# Patient Record
Sex: Male | Born: 1940 | Race: White | Hispanic: No | Marital: Married | Smoking: Former smoker
Health system: Southern US, Community
[De-identification: ages and names within clinical notes are randomized; demographics above are authoritative.]

## PROBLEM LIST (undated history)

## (undated) DIAGNOSIS — R200 Anesthesia of skin: Secondary | ICD-10-CM

## (undated) DIAGNOSIS — K8044 Calculus of bile duct with chronic cholecystitis without obstruction: Secondary | ICD-10-CM

## (undated) DIAGNOSIS — K219 Gastro-esophageal reflux disease without esophagitis: Secondary | ICD-10-CM

## (undated) DIAGNOSIS — Z8711 Personal history of peptic ulcer disease: Secondary | ICD-10-CM

## (undated) DIAGNOSIS — N2889 Other specified disorders of kidney and ureter: Secondary | ICD-10-CM

## (undated) DIAGNOSIS — N400 Enlarged prostate without lower urinary tract symptoms: Secondary | ICD-10-CM

## (undated) DIAGNOSIS — T8859XA Other complications of anesthesia, initial encounter: Secondary | ICD-10-CM

## (undated) DIAGNOSIS — R9430 Abnormal result of cardiovascular function study, unspecified: Secondary | ICD-10-CM

## (undated) DIAGNOSIS — I10 Essential (primary) hypertension: Secondary | ICD-10-CM

## (undated) DIAGNOSIS — Z87442 Personal history of urinary calculi: Secondary | ICD-10-CM

## (undated) DIAGNOSIS — Z8614 Personal history of Methicillin resistant Staphylococcus aureus infection: Secondary | ICD-10-CM

## (undated) DIAGNOSIS — Z01810 Encounter for preprocedural cardiovascular examination: Secondary | ICD-10-CM

## (undated) DIAGNOSIS — I2510 Atherosclerotic heart disease of native coronary artery without angina pectoris: Secondary | ICD-10-CM

## (undated) DIAGNOSIS — M1990 Unspecified osteoarthritis, unspecified site: Secondary | ICD-10-CM

## (undated) DIAGNOSIS — N289 Disorder of kidney and ureter, unspecified: Secondary | ICD-10-CM

## (undated) DIAGNOSIS — E1169 Type 2 diabetes mellitus with other specified complication: Secondary | ICD-10-CM

## (undated) DIAGNOSIS — L97509 Non-pressure chronic ulcer of other part of unspecified foot with unspecified severity: Secondary | ICD-10-CM

## (undated) DIAGNOSIS — F419 Anxiety disorder, unspecified: Secondary | ICD-10-CM

## (undated) DIAGNOSIS — G629 Polyneuropathy, unspecified: Secondary | ICD-10-CM

## (undated) DIAGNOSIS — K859 Acute pancreatitis without necrosis or infection, unspecified: Secondary | ICD-10-CM

## (undated) DIAGNOSIS — R350 Frequency of micturition: Secondary | ICD-10-CM

## (undated) DIAGNOSIS — Z794 Long term (current) use of insulin: Secondary | ICD-10-CM

## (undated) DIAGNOSIS — IMO0002 Reserved for concepts with insufficient information to code with codable children: Secondary | ICD-10-CM

## (undated) DIAGNOSIS — E11621 Type 2 diabetes mellitus with foot ulcer: Secondary | ICD-10-CM

## (undated) DIAGNOSIS — R21 Rash and other nonspecific skin eruption: Secondary | ICD-10-CM

## (undated) DIAGNOSIS — G4733 Obstructive sleep apnea (adult) (pediatric): Secondary | ICD-10-CM

## (undated) DIAGNOSIS — R319 Hematuria, unspecified: Secondary | ICD-10-CM

## (undated) DIAGNOSIS — E119 Type 2 diabetes mellitus without complications: Secondary | ICD-10-CM

## (undated) DIAGNOSIS — E785 Hyperlipidemia, unspecified: Secondary | ICD-10-CM

## (undated) DIAGNOSIS — C801 Malignant (primary) neoplasm, unspecified: Secondary | ICD-10-CM

## (undated) DIAGNOSIS — Z8739 Personal history of other diseases of the musculoskeletal system and connective tissue: Secondary | ICD-10-CM

## (undated) DIAGNOSIS — T4145XA Adverse effect of unspecified anesthetic, initial encounter: Secondary | ICD-10-CM

## (undated) DIAGNOSIS — R351 Nocturia: Secondary | ICD-10-CM

## (undated) DIAGNOSIS — Z8719 Personal history of other diseases of the digestive system: Secondary | ICD-10-CM

## (undated) HISTORY — PX: BILROTH II PROCEDURE: SHX1232

## (undated) HISTORY — PX: CARDIAC CATHETERIZATION: SHX172

## (undated) HISTORY — DX: Disorder of kidney and ureter, unspecified: N28.9

## (undated) HISTORY — DX: Polyneuropathy, unspecified: G62.9

## (undated) HISTORY — DX: Gastro-esophageal reflux disease without esophagitis: K21.9

## (undated) HISTORY — DX: Abnormal result of cardiovascular function study, unspecified: R94.30

## (undated) HISTORY — PX: OTHER SURGICAL HISTORY: SHX169

## (undated) HISTORY — DX: Encounter for preprocedural cardiovascular examination: Z01.810

## (undated) HISTORY — DX: Type 2 diabetes mellitus with other specified complication: E11.69

## (undated) HISTORY — DX: Reserved for concepts with insufficient information to code with codable children: IMO0002

## (undated) HISTORY — DX: Hyperlipidemia, unspecified: E78.5

---

## 1994-05-03 HISTORY — PX: CORONARY ANGIOPLASTY WITH STENT PLACEMENT: SHX49

## 1997-11-30 ENCOUNTER — Inpatient Hospital Stay (HOSPITAL_COMMUNITY): Admission: EM | Admit: 1997-11-30 | Discharge: 1997-12-03 | Payer: Self-pay | Admitting: *Deleted

## 1998-05-03 HISTORY — PX: OTHER SURGICAL HISTORY: SHX169

## 1998-11-28 ENCOUNTER — Inpatient Hospital Stay (HOSPITAL_COMMUNITY): Admission: AD | Admit: 1998-11-28 | Discharge: 1998-12-07 | Payer: Self-pay | Admitting: Family Medicine

## 1998-11-28 ENCOUNTER — Encounter: Payer: Self-pay | Admitting: Family Medicine

## 1998-11-29 ENCOUNTER — Encounter: Payer: Self-pay | Admitting: Family Medicine

## 1998-12-05 ENCOUNTER — Encounter: Payer: Self-pay | Admitting: Family Medicine

## 1999-04-16 ENCOUNTER — Encounter: Admission: RE | Admit: 1999-04-16 | Discharge: 1999-04-16 | Payer: Self-pay | Admitting: Orthopedic Surgery

## 1999-04-16 ENCOUNTER — Encounter: Payer: Self-pay | Admitting: Orthopedic Surgery

## 1999-05-01 ENCOUNTER — Encounter: Payer: Self-pay | Admitting: Emergency Medicine

## 1999-05-02 ENCOUNTER — Inpatient Hospital Stay (HOSPITAL_COMMUNITY): Admission: EM | Admit: 1999-05-02 | Discharge: 1999-05-05 | Payer: Self-pay | Admitting: Cardiology

## 1999-08-26 ENCOUNTER — Encounter: Payer: Self-pay | Admitting: Emergency Medicine

## 1999-08-26 ENCOUNTER — Encounter: Payer: Self-pay | Admitting: Cardiology

## 1999-08-26 ENCOUNTER — Inpatient Hospital Stay (HOSPITAL_COMMUNITY): Admission: EM | Admit: 1999-08-26 | Discharge: 1999-08-27 | Payer: Self-pay | Admitting: Emergency Medicine

## 2000-02-25 ENCOUNTER — Ambulatory Visit (HOSPITAL_COMMUNITY): Admission: RE | Admit: 2000-02-25 | Discharge: 2000-02-25 | Payer: Self-pay | Admitting: Specialist

## 2000-02-25 ENCOUNTER — Encounter: Payer: Self-pay | Admitting: Specialist

## 2001-02-21 ENCOUNTER — Encounter: Admission: RE | Admit: 2001-02-21 | Discharge: 2001-05-02 | Payer: Self-pay | Admitting: Orthopedic Surgery

## 2001-02-28 ENCOUNTER — Ambulatory Visit (HOSPITAL_COMMUNITY): Admission: RE | Admit: 2001-02-28 | Discharge: 2001-02-28 | Payer: Self-pay | Admitting: Orthopedic Surgery

## 2001-02-28 ENCOUNTER — Encounter: Payer: Self-pay | Admitting: Orthopedic Surgery

## 2001-03-01 ENCOUNTER — Encounter: Payer: Self-pay | Admitting: Orthopedic Surgery

## 2001-03-17 ENCOUNTER — Emergency Department (HOSPITAL_COMMUNITY): Admission: EM | Admit: 2001-03-17 | Discharge: 2001-03-17 | Payer: Self-pay

## 2001-06-03 ENCOUNTER — Encounter: Admission: RE | Admit: 2001-06-03 | Discharge: 2001-08-04 | Payer: Self-pay | Admitting: Orthopedic Surgery

## 2001-09-11 ENCOUNTER — Encounter (HOSPITAL_BASED_OUTPATIENT_CLINIC_OR_DEPARTMENT_OTHER): Admission: RE | Admit: 2001-09-11 | Discharge: 2001-12-10 | Payer: Self-pay | Admitting: Internal Medicine

## 2002-02-15 ENCOUNTER — Encounter (HOSPITAL_BASED_OUTPATIENT_CLINIC_OR_DEPARTMENT_OTHER): Admission: RE | Admit: 2002-02-15 | Discharge: 2002-05-16 | Payer: Self-pay | Admitting: Internal Medicine

## 2002-10-23 ENCOUNTER — Encounter: Admission: RE | Admit: 2002-10-23 | Discharge: 2002-12-13 | Payer: Self-pay | Admitting: Family Medicine

## 2003-05-04 HISTORY — PX: CATARACT EXTRACTION W/ INTRAOCULAR LENS  IMPLANT, BILATERAL: SHX1307

## 2004-05-13 ENCOUNTER — Encounter (HOSPITAL_BASED_OUTPATIENT_CLINIC_OR_DEPARTMENT_OTHER): Admission: RE | Admit: 2004-05-13 | Discharge: 2004-07-24 | Payer: Self-pay | Admitting: Internal Medicine

## 2004-08-03 ENCOUNTER — Encounter (HOSPITAL_BASED_OUTPATIENT_CLINIC_OR_DEPARTMENT_OTHER): Admission: RE | Admit: 2004-08-03 | Discharge: 2004-11-01 | Payer: Self-pay | Admitting: Surgery

## 2004-08-04 ENCOUNTER — Ambulatory Visit (HOSPITAL_COMMUNITY): Admission: RE | Admit: 2004-08-04 | Discharge: 2004-08-04 | Payer: Self-pay | Admitting: Internal Medicine

## 2006-05-03 DIAGNOSIS — Z8719 Personal history of other diseases of the digestive system: Secondary | ICD-10-CM

## 2006-05-03 HISTORY — PX: PENILE PROSTHESIS IMPLANT: SHX240

## 2006-05-03 HISTORY — DX: Personal history of other diseases of the digestive system: Z87.19

## 2006-07-07 ENCOUNTER — Encounter (HOSPITAL_BASED_OUTPATIENT_CLINIC_OR_DEPARTMENT_OTHER): Admission: RE | Admit: 2006-07-07 | Discharge: 2006-10-05 | Payer: Self-pay | Admitting: Surgery

## 2007-02-02 ENCOUNTER — Inpatient Hospital Stay (HOSPITAL_COMMUNITY): Admission: EM | Admit: 2007-02-02 | Discharge: 2007-02-07 | Payer: Self-pay | Admitting: Emergency Medicine

## 2007-02-13 ENCOUNTER — Ambulatory Visit: Payer: Self-pay | Admitting: Internal Medicine

## 2008-06-17 ENCOUNTER — Encounter: Payer: Self-pay | Admitting: Family Medicine

## 2008-07-24 ENCOUNTER — Ambulatory Visit: Payer: Self-pay | Admitting: Family Medicine

## 2008-07-24 DIAGNOSIS — G609 Hereditary and idiopathic neuropathy, unspecified: Secondary | ICD-10-CM

## 2008-07-24 DIAGNOSIS — L84 Corns and callosities: Secondary | ICD-10-CM | POA: Insufficient documentation

## 2008-07-24 DIAGNOSIS — E785 Hyperlipidemia, unspecified: Secondary | ICD-10-CM

## 2008-07-24 DIAGNOSIS — K219 Gastro-esophageal reflux disease without esophagitis: Secondary | ICD-10-CM | POA: Insufficient documentation

## 2008-07-24 DIAGNOSIS — J309 Allergic rhinitis, unspecified: Secondary | ICD-10-CM | POA: Insufficient documentation

## 2008-07-24 DIAGNOSIS — N189 Chronic kidney disease, unspecified: Secondary | ICD-10-CM | POA: Insufficient documentation

## 2008-07-24 DIAGNOSIS — N259 Disorder resulting from impaired renal tubular function, unspecified: Secondary | ICD-10-CM | POA: Insufficient documentation

## 2008-08-01 ENCOUNTER — Ambulatory Visit: Payer: Self-pay | Admitting: Family Medicine

## 2008-08-01 DIAGNOSIS — L97409 Non-pressure chronic ulcer of unspecified heel and midfoot with unspecified severity: Secondary | ICD-10-CM | POA: Insufficient documentation

## 2008-08-01 DIAGNOSIS — E1169 Type 2 diabetes mellitus with other specified complication: Secondary | ICD-10-CM

## 2008-08-01 HISTORY — DX: Type 2 diabetes mellitus with other specified complication: E11.69

## 2008-08-13 ENCOUNTER — Ambulatory Visit: Payer: Self-pay | Admitting: Family Medicine

## 2008-08-27 ENCOUNTER — Ambulatory Visit: Payer: Self-pay | Admitting: Family Medicine

## 2008-09-17 ENCOUNTER — Ambulatory Visit: Payer: Self-pay | Admitting: Family Medicine

## 2008-09-17 DIAGNOSIS — J069 Acute upper respiratory infection, unspecified: Secondary | ICD-10-CM | POA: Insufficient documentation

## 2008-09-18 ENCOUNTER — Telehealth: Payer: Self-pay | Admitting: Family Medicine

## 2008-09-18 LAB — CONVERTED CEMR LAB
ALT: 19 units/L (ref 0–53)
AST: 24 units/L (ref 0–37)
Alkaline Phosphatase: 32 units/L — ABNORMAL LOW (ref 39–117)
Bilirubin, Direct: 0.1 mg/dL (ref 0.0–0.3)
Chloride: 107 meq/L (ref 96–112)
Glucose, Bld: 176 mg/dL — ABNORMAL HIGH (ref 70–99)
HDL: 24.7 mg/dL — ABNORMAL LOW (ref >39.00)
LDL Cholesterol: 64 mg/dL (ref 0–99)
Potassium: 3.8 meq/L (ref 3.5–5.1)
Sodium: 145 meq/L (ref 135–145)
Total Bilirubin: 0.8 mg/dL (ref 0.3–1.2)
VLDL: 33.8 mg/dL (ref 0.0–40.0)

## 2008-09-27 ENCOUNTER — Telehealth: Payer: Self-pay | Admitting: Family Medicine

## 2008-10-04 ENCOUNTER — Encounter: Payer: Self-pay | Admitting: Family Medicine

## 2008-10-04 DIAGNOSIS — E663 Overweight: Secondary | ICD-10-CM | POA: Insufficient documentation

## 2008-10-04 DIAGNOSIS — A4902 Methicillin resistant Staphylococcus aureus infection, unspecified site: Secondary | ICD-10-CM | POA: Insufficient documentation

## 2008-10-04 DIAGNOSIS — G4730 Sleep apnea, unspecified: Secondary | ICD-10-CM

## 2008-10-08 ENCOUNTER — Ambulatory Visit: Payer: Self-pay | Admitting: Family Medicine

## 2008-12-19 ENCOUNTER — Ambulatory Visit: Payer: Self-pay | Admitting: Family Medicine

## 2008-12-20 LAB — CONVERTED CEMR LAB: Hgb A1c MFr Bld: 6.9 % — ABNORMAL HIGH (ref 4.6–6.5)

## 2008-12-24 ENCOUNTER — Ambulatory Visit: Payer: Self-pay | Admitting: Family Medicine

## 2008-12-25 ENCOUNTER — Encounter: Payer: Self-pay | Admitting: Family Medicine

## 2009-02-19 ENCOUNTER — Ambulatory Visit: Payer: Self-pay | Admitting: Family Medicine

## 2009-02-19 ENCOUNTER — Encounter: Payer: Self-pay | Admitting: Family Medicine

## 2009-03-31 ENCOUNTER — Telehealth: Payer: Self-pay | Admitting: Family Medicine

## 2009-04-07 ENCOUNTER — Ambulatory Visit: Payer: Self-pay | Admitting: Family Medicine

## 2009-04-07 DIAGNOSIS — R42 Dizziness and giddiness: Secondary | ICD-10-CM | POA: Insufficient documentation

## 2009-04-09 ENCOUNTER — Telehealth: Payer: Self-pay | Admitting: Family Medicine

## 2009-04-10 ENCOUNTER — Telehealth: Payer: Self-pay | Admitting: Family Medicine

## 2009-04-18 ENCOUNTER — Ambulatory Visit: Payer: Self-pay | Admitting: Family Medicine

## 2009-04-18 LAB — CONVERTED CEMR LAB
Albumin: 3.7 g/dL (ref 3.5–5.2)
CO2: 27 meq/L (ref 19–32)
Calcium: 9.4 mg/dL (ref 8.4–10.5)
Chloride: 101 meq/L (ref 96–112)
Cholesterol: 140 mg/dL (ref 0–200)
GFR calc non Af Amer: 37.69 mL/min (ref >60)
HDL: 27.7 mg/dL — ABNORMAL LOW (ref >39.00)
Sodium: 138 meq/L (ref 135–145)
Total Bilirubin: 0.7 mg/dL (ref 0.3–1.2)
Total CHOL/HDL Ratio: 5

## 2009-05-27 ENCOUNTER — Ambulatory Visit: Payer: Self-pay | Admitting: Family Medicine

## 2009-06-10 ENCOUNTER — Ambulatory Visit: Payer: Self-pay | Admitting: Family Medicine

## 2009-06-24 ENCOUNTER — Ambulatory Visit: Payer: Self-pay | Admitting: Family Medicine

## 2009-06-25 ENCOUNTER — Encounter: Payer: Self-pay | Admitting: Family Medicine

## 2009-06-25 ENCOUNTER — Telehealth: Payer: Self-pay | Admitting: Family Medicine

## 2009-07-08 ENCOUNTER — Ambulatory Visit: Payer: Self-pay | Admitting: Family Medicine

## 2009-07-11 ENCOUNTER — Encounter (HOSPITAL_BASED_OUTPATIENT_CLINIC_OR_DEPARTMENT_OTHER): Admission: RE | Admit: 2009-07-11 | Discharge: 2009-09-30 | Payer: Self-pay | Admitting: Internal Medicine

## 2009-07-15 ENCOUNTER — Telehealth: Payer: Self-pay | Admitting: Family Medicine

## 2009-07-21 ENCOUNTER — Ambulatory Visit: Payer: Self-pay | Admitting: Family Medicine

## 2009-07-23 LAB — CONVERTED CEMR LAB
Calcium: 9.3 mg/dL (ref 8.4–10.5)
Creatinine, Ser: 1.9 mg/dL — ABNORMAL HIGH (ref 0.4–1.5)
PSA: 0.97 ng/mL (ref 0.10–4.00)

## 2009-08-19 ENCOUNTER — Ambulatory Visit: Payer: Self-pay | Admitting: Family Medicine

## 2009-08-19 LAB — CONVERTED CEMR LAB: Rapid Strep: NEGATIVE

## 2009-10-01 ENCOUNTER — Encounter (HOSPITAL_BASED_OUTPATIENT_CLINIC_OR_DEPARTMENT_OTHER): Admission: RE | Admit: 2009-10-01 | Discharge: 2009-11-06 | Payer: Self-pay | Admitting: Internal Medicine

## 2009-11-07 ENCOUNTER — Encounter (HOSPITAL_BASED_OUTPATIENT_CLINIC_OR_DEPARTMENT_OTHER): Admission: RE | Admit: 2009-11-07 | Discharge: 2009-12-16 | Payer: Self-pay | Admitting: Internal Medicine

## 2009-12-08 ENCOUNTER — Ambulatory Visit: Payer: Self-pay | Admitting: Family Medicine

## 2009-12-08 DIAGNOSIS — M76899 Other specified enthesopathies of unspecified lower limb, excluding foot: Secondary | ICD-10-CM

## 2009-12-08 LAB — CONVERTED CEMR LAB
Cholesterol, target level: 200 mg/dL
LDL Goal: 100 mg/dL

## 2009-12-09 LAB — CONVERTED CEMR LAB
CO2: 32 meq/L (ref 19–32)
GFR calc non Af Amer: 39.04 mL/min (ref >60)
Glucose, Bld: 66 mg/dL — ABNORMAL LOW (ref 70–99)
Hgb A1c MFr Bld: 8.4 % — ABNORMAL HIGH (ref 4.6–6.5)
Potassium: 4.4 meq/L (ref 3.5–5.1)

## 2010-03-09 ENCOUNTER — Ambulatory Visit: Payer: Self-pay | Admitting: Family Medicine

## 2010-03-09 LAB — CONVERTED CEMR LAB
ALT: 17 units/L (ref 0–53)
Albumin: 3.2 g/dL — ABNORMAL LOW (ref 3.5–5.2)
Basophils Absolute: 0 10*3/uL (ref 0.0–0.1)
Basophils Relative: 0.3 % (ref 0.0–3.0)
Bilirubin, Direct: 0.1 mg/dL (ref 0.0–0.3)
Eosinophils Absolute: 0.4 10*3/uL (ref 0.0–0.7)
Hemoglobin: 13.5 g/dL (ref 13.0–17.0)
Lymphs Abs: 1.9 10*3/uL (ref 0.7–4.0)
Monocytes Absolute: 0.7 10*3/uL (ref 0.1–1.0)
Platelets: 246 10*3/uL (ref 150.0–400.0)
RBC: 4.64 M/uL (ref 4.22–5.81)
RDW: 14.9 % — ABNORMAL HIGH (ref 11.5–14.6)
Total Protein: 5.9 g/dL — ABNORMAL LOW (ref 6.0–8.3)
WBC: 9.1 10*3/uL (ref 4.5–10.5)

## 2010-03-12 ENCOUNTER — Telehealth: Payer: Self-pay | Admitting: Family Medicine

## 2010-03-12 DIAGNOSIS — K859 Acute pancreatitis without necrosis or infection, unspecified: Secondary | ICD-10-CM

## 2010-03-12 HISTORY — DX: Acute pancreatitis without necrosis or infection, unspecified: K85.90

## 2010-03-18 ENCOUNTER — Encounter: Admission: RE | Admit: 2010-03-18 | Discharge: 2010-03-18 | Payer: Self-pay | Admitting: Family Medicine

## 2010-03-23 ENCOUNTER — Ambulatory Visit: Payer: Self-pay | Admitting: Family Medicine

## 2010-03-23 LAB — HM DIABETES FOOT EXAM

## 2010-03-24 LAB — CONVERTED CEMR LAB
AST: 20 units/L (ref 0–37)
Albumin: 3.2 g/dL — ABNORMAL LOW (ref 3.5–5.2)
Bilirubin, Direct: 0.1 mg/dL (ref 0.0–0.3)
Direct LDL: 82.7 mg/dL
Total Bilirubin: 0.4 mg/dL (ref 0.3–1.2)
Total Protein: 6.1 g/dL (ref 6.0–8.3)

## 2010-04-06 ENCOUNTER — Ambulatory Visit: Payer: Self-pay | Admitting: Family Medicine

## 2010-04-17 ENCOUNTER — Telehealth: Payer: Self-pay | Admitting: Family Medicine

## 2010-05-23 ENCOUNTER — Encounter: Payer: Self-pay | Admitting: Specialist

## 2010-05-24 ENCOUNTER — Encounter: Payer: Self-pay | Admitting: Family Medicine

## 2010-05-24 ENCOUNTER — Encounter: Payer: Self-pay | Admitting: Specialist

## 2010-05-24 ENCOUNTER — Encounter (HOSPITAL_BASED_OUTPATIENT_CLINIC_OR_DEPARTMENT_OTHER): Payer: Self-pay | Admitting: Internal Medicine

## 2010-06-04 NOTE — Assessment & Plan Note (Signed)
Summary: ?abd pain ok per doc/njr   Vital Signs:  Patient profile:   70 year old male Weight:      296 pounds Temp:     98.7 degrees F oral BP sitting:   130 / 62  (left arm) Cuff size:   large  Vitals Entered By: Sid Falcon LPN (March 09, 2010 2:39 PM)  History of Present Illness: Patient seen as a work in with onset Saturday 2 days ago of mostly midepigastric pain with some radiation to the back. Pain is relatively constant and described as a dull ache of moderate severity. Decreased appetite. Has some nausea and 4 episodes of vomiting bile-like fluid. No hematemesis. Increased omeprazole to 40 mg twice daily without improvement. Denies any fever. Somewhat similar symptoms previously with pancreatitis several years ago. No history of gallbladder disease. No localizing right upper quadrant pain. Last bowel movement was this past Saturday and normal  Type 2 diabetes and not checking sugars much recently.  Allergies: 1)  ! Sulfamethoxazole (Sulfamethoxazole) 2)  ! Dilaudid (Hydromorphone Hcl)  Past History:  Past Medical History: Last updated: 10/04/2008 Allergic rhinitis Diabetes mellitus, type II GERD Hyperlipidemia Peripheral neuropathy Renal insufficiency hx diabetic foot ulcers overweight sleep apnea--CPAP acute pancreatitis 10/08 MRSA, hx of  Past Surgical History: Last updated: 10/04/2008 PTCA/stent penile implant  Social History: Last updated: 07/24/2008 Retired Married Never Smoked Alcohol use-no Regular exercise-no  Risk Factors: Exercise: no (07/24/2008)  Risk Factors: Smoking Status: never (07/24/2008) PMH-FH-SH reviewed for relevance  Review of Systems       The patient complains of anorexia and abdominal pain.  The patient denies fever, weight loss, chest pain, syncope, dyspnea on exertion, peripheral edema, hemoptysis, melena, hematochezia, and severe indigestion/heartburn.    Physical Exam  General:  Well-developed,well-nourished,in  no acute distress; alert,appropriate and cooperative throughout examination Mouth:  Oral mucosa and oropharynx without lesions or exudates.  Teeth in good repair. Neck:  No deformities, masses, or tenderness noted. Lungs:  Normal respiratory effort, chest expands symmetrically. Lungs are clear to auscultation, no crackles or wheezes. Heart:  normal rate and regular rhythm.   Abdomen:  soft, normal bowel sounds, no distention, no masses, no guarding, no rigidity, no abdominal hernia, no hepatomegaly, and no splenomegaly.  patient has tenderness mid epigastric area but also somewhat poorly localized left upper quadrant and some mid right and left quadrants. Cervical Nodes:  No lymphadenopathy noted Psych:  normally interactive, good eye contact, and not depressed appearing.     Impression & Recommendations:  Problem # 1:  ABDOMINAL PAIN, EPIGASTRIC (ICD-789.06) Assessment New  differential includes acute pancreatitis, diabetic gastroparesis, peptic ulcer disease. Not dehydrated currently.  Labs and metoclopramide for nausea. Orders: Venipuncture (04540) Specimen Handling (98119) TLB-Hepatic/Liver Function Pnl (80076-HEPATIC) TLB-CBC Platelet - w/Differential (85025-CBCD) TLB-Lipase (83690-LIPASE)  His updated medication list for this problem includes:    Reglan 10 Mg Tabs (Metoclopramide hcl) ..... One by mouth qid - a ac meals and q hs as needed nausea and vomiting  Problem # 2:  DIABETES MELLITUS, TYPE II (ICD-250.00)  His updated medication list for this problem includes:    Lisinopril 10 Mg Tabs (Lisinopril) ..... Once daily    Humulin 70/30 70-30 % Susp (Insulin isophane & regular) ..... Inject 70 units subcutaneously in the morning and 50-60 units in the evening as directed  Complete Medication List: 1)  Omeprazole 20 Mg Cpdr (Omeprazole) .... Two times a day 2)  Gabapentin 800 Mg Tabs (Gabapentin) .... One tab 5 x  day 3)  Lisinopril 10 Mg Tabs (Lisinopril) .... Once daily 4)   Furosemide 40 Mg Tabs (Furosemide) .... Once daily 5)  Oxistat 1 % Crea (Oxiconazole nitrate) .... Use as directed 6)  Fexofenadine Hcl 180 Mg Tabs (Fexofenadine hcl) .... Once daily 7)  Fenofibrate 160 Mg Tabs (Fenofibrate) .... One by mouth once daily 8)  Simvastatin 40 Mg Tabs (Simvastatin) .... One by mouth once daily 9)  1st Choice Pen Needles 31g X 8 Mm Misc (Insulin pen needle) 10)  Humulin 70/30 70-30 % Susp (Insulin isophane & regular) .... Inject 70 units subcutaneously in the morning and 50-60 units in the evening as directed 11)  Relion Insulin Syringe 30g X 5/16" 1 Ml Misc (Insulin syringe-needle u-100) .... Use as directed two times a day 12)  Relion Ultima Test Strp (Glucose blood) .... Check fasting blood sugar in the morning as directed 13)  Reglan 10 Mg Tabs (Metoclopramide hcl) .... One by mouth qid - a ac meals and q hs as needed nausea and vomiting  Patient Instructions: 1)  Bland diet. 2)  Please schedule a follow-up appointment in 2 weeks.  3)  Follow up sooner if any fever, persistent vomiting, or any worsening abdominal pain. Prescriptions: REGLAN 10 MG TABS (METOCLOPRAMIDE HCL) one by mouth qid - a AC meals and q hs as needed nausea and vomiting  #30 x 0   Entered and Authorized by:   Evelena Peat MD   Signed by:   Evelena Peat MD on 03/09/2010   Method used:   Electronically to        [facility] [phone]* (retail)       [address] [place], [place]  [address]       Ph: [phone]       Fax: [phone]   RxID:   [id number]    Orders Added: 1)  Venipuncture [24401] 2)  Specimen Handling [99000] 3)  TLB-Hepatic/Liver Function Pnl [80076-HEPATIC] 4)  TLB-CBC Platelet - w/Differential [85025-CBCD] 5)  TLB-Lipase [83690-LIPASE] 6)  Est. Patient Level IV [02725]

## 2010-06-04 NOTE — Progress Notes (Signed)
Summary: pain meds  Phone Note Call from Patient Call back at Home Phone [phone]   Caller: Patient Call For: Frank Peat MD Summary of Call: Pt is still having a lot of pain, and the meds are not helping.  Needs something else. Initial call taken by: [facility] CMA AAMA,  March 12, 2010 12:36 PM  Follow-up for Phone Call        already answered in another Phone note. Follow-up by: Frank Peat MD,  March 12, 2010 12:51 PM

## 2010-06-04 NOTE — Progress Notes (Signed)
Summary: sore throat  Phone Note Call from Patient   Summary of Call: Pt is complaining of right ear pain and sore throat,  Refuses office visit. Cyndie Mull [phone] Initial call taken by: Lynann Beaver CMA,  June 25, 2009 12:58 PM  Follow-up for Phone Call        Very likely viral.  If no fever I would recommend treating symptomatically with Tylenol.   antibiotics are not likely to help.  If he has any purulent nasal secretions or productive cough would consider antibiotic. Follow-up by: Evelena Peat MD,  June 25, 2009 1:03 PM  Additional Follow-up for Phone Call Additional follow up Details #1::        Spoke with pt.  Severe sore throat and Occ productive cough.  OK to call in Amoxicillin 875 mg by mouth two times a day for 10 days. Additional Follow-up by: Evelena Peat MD,  June 25, 2009 1:21 PM    New/Updated Medications: AMOXICILLIN 875 MG TABS (AMOXICILLIN) one by mouth bid Prescriptions: AMOXICILLIN 875 MG TABS (AMOXICILLIN) one by mouth bid  #20 x 0   Entered by:   Lynann Beaver CMA   Authorized by:   Evelena Peat MD   Signed by:   Lynann Beaver CMA on 06/26/2009   Method used:   Electronically to        [facility] [phone]* (retail)       [address] [place], [place]  [address]       Ph: [phone]       Fax: [phone]   RxID:   [id number]

## 2010-06-04 NOTE — Miscellaneous (Signed)
Summary: Orders Update  Clinical Lists Changes  Orders: Added new Referral order of Wound Care Center Referral (Wound Care) - Signed

## 2010-06-04 NOTE — Assessment & Plan Note (Signed)
Summary: follow up/cjr   Vital Signs:  Patient profile:   70 year old male Weight:      300 pounds Temp:     98.0 degrees F oral BP sitting:   140 / 80  (left arm) Cuff size:   large  Vitals Entered By: Sid Falcon LPN (December 08, 2009 10:19 AM) CC: Right leg, hip pain, Lipid Management   History of Present Illness: Patient here for medical followup.  Type 2 diabetes. He has had decent control recently but has had poor compliance with diet. Poor healing right plantar great toe ulcer and was sent for hyperbaric treatment. Patient states that his blood sugars had been slightly elevated for that treatment and that started his poor compliance. Recent fasting blood sugars around 150 but some postprandial sugars around 400 at night. No significant symptoms of hyperglycemia.  Last eye exam last fall. Patient compliant with diabetic medications  Lipids were checked last fall and low HDL but good control of cholesterol and LDL.  Patient presents with a one-month history of pain mostly right lateral hip area radiating toward the knee. Deep achy pain worse at night. No relief with topical rubs. Prescribed some sort of pain medication from wound treatment center but this did not help. Denies any weakness lower extremity.  Lipid Management History:      Positive NCEP/ATP III risk factors include male age 38 years old or older, diabetes, HDL cholesterol less than 40, hypertension, and ASHD (either angina/prior MI/prior CABG).  Negative NCEP/ATP III risk factors include non-tobacco-user status, no prior stroke/TIA, no peripheral vascular disease, and no history of aortic aneurysm.     Allergies: 1)  ! Sulfamethoxazole (Sulfamethoxazole) 2)  ! Dilaudid (Hydromorphone Hcl)  Past History:  Past Medical History: Last updated: 10/04/2008 Allergic rhinitis Diabetes mellitus, type II GERD Hyperlipidemia Peripheral neuropathy Renal insufficiency hx diabetic foot ulcers overweight sleep  apnea--CPAP acute pancreatitis 10/08 MRSA, hx of  Past Surgical History: Last updated: 10/04/2008 PTCA/stent penile implant  Social History: Last updated: 07/24/2008 Retired Married Never Smoked Alcohol use-no Regular exercise-no  Risk Factors: Exercise: no (07/24/2008)  Risk Factors: Smoking Status: never (07/24/2008) PMH-FH-SH reviewed for relevance  Review of Systems       The patient complains of weight gain.  The patient denies anorexia, fever, weight loss, chest pain, syncope, dyspnea on exertion, peripheral edema, prolonged cough, headaches, abdominal pain, melena, and hematochezia.    Physical Exam  General:  Well-developed,well-nourished,in no acute distress; alert,appropriate and cooperative throughout examination Head:  Normocephalic and atraumatic without obvious abnormalities. No apparent alopecia or balding. Mouth:  Oral mucosa and oropharynx without lesions or exudates.  Teeth in good repair. Neck:  No deformities, masses, or tenderness noted. Lungs:  Normal respiratory effort, chest expands symmetrically. Lungs are clear to auscultation, no crackles or wheezes. Heart:  normal rate and regular rhythm.   Extremities:  patient has tenderness right greater trochanteric bursa region. Good range of motion. Straight leg raise on right is equivocal.  patient has callus on  right great toe plantar surface at the site of previous ulceration. This is finally healed. Neurologic:  no focal strength deficits. Patient has impaired sensory function right foot which is chronic Skin:  no rashes.   Psych:  normally interactive, good eye contact, not anxious appearing, and not depressed appearing.     Impression & Recommendations:  Problem # 1:  DIABETES MELLITUS, TYPE II (ICD-250.00) recheck A1C. His updated medication list for this problem includes:  Lisinopril 10 Mg Tabs (Lisinopril) ..... Once daily    Humulin 70/30 70-30 % Susp (Insulin isophane & regular) .....  Inject 70 units subcutaneously in the morning and 50-60 units in the evening as directed  Orders: Specimen Handling (16109) Venipuncture (60454) TLB-A1C / Hgb A1C (Glycohemoglobin) (83036-A1C)  Problem # 2:  DIABETIC FOOT ULCER, TOE (ICD-250.80) Assessment: Improved  His updated medication list for this problem includes:    Lisinopril 10 Mg Tabs (Lisinopril) ..... Once daily    Humulin 70/30 70-30 % Susp (Insulin isophane & regular) ..... Inject 70 units subcutaneously in the morning and 50-60 units in the evening as directed  Problem # 3:  RENAL INSUFFICIENCY (ICD-588.9)  Orders: TLB-BMP (Basic Metabolic Panel-BMET) (80048-METABOL)  Problem # 4:  HYPERLIPIDEMIA (ICD-272.4)  His updated medication list for this problem includes:    Fenofibrate 160 Mg Tabs (Fenofibrate) ..... One by mouth once daily    Simvastatin 40 Mg Tabs (Simvastatin) ..... One by mouth once daily  Problem # 5:  BURSITIS, HIP (ICD-726.5) Assessment: New discussed risks and benefits of steroid injection into bursa region and pt consented to treatment.  Prepped skin with betadine and using sterile technique, injected 40 mg depomedrol and 2 cc plain xylocaine into bursa region without difficulty. Orders: Joint Aspirate / Injection, Large (20610)  Complete Medication List: 1)  Omeprazole 20 Mg Cpdr (Omeprazole) .... Two times a day 2)  Gabapentin 800 Mg Tabs (Gabapentin) .... One tab 5 x day 3)  Lisinopril 10 Mg Tabs (Lisinopril) .... Once daily 4)  Furosemide 40 Mg Tabs (Furosemide) .... Once daily 5)  Iodosorb 0.9 % Gel (Cadexomer iodine) .... Use as directed 6)  Oxistat 1 % Crea (Oxiconazole nitrate) .... Use as directed 7)  Fexofenadine Hcl 180 Mg Tabs (Fexofenadine hcl) .... Once daily 8)  Fenofibrate 160 Mg Tabs (Fenofibrate) .... One by mouth once daily 9)  Simvastatin 40 Mg Tabs (Simvastatin) .... One by mouth once daily 10)  1st Choice Pen Needles 31g X 8 Mm Misc (Insulin pen needle) 11)  Humulin  70/30 70-30 % Susp (Insulin isophane & regular) .... Inject 70 units subcutaneously in the morning and 50-60 units in the evening as directed 12)  Relion Insulin Syringe 30g X 5/16" 1 Ml Misc (Insulin syringe-needle u-100) .... Use as directed two times a day 13)  Hydrocodone-homatropine 5-1.5 Mg/49ml Syrp (Hydrocodone-homatropine) .... One tsp by mouth q 4-6 hours as needed cough 14)  Relion Ultima Test Strp (Glucose blood) .... Check fasting blood sugar in the morning as directed 15)  Meclizine Hcl 25 Mg Tabs (Meclizine hcl) .... One by mouth q 6 hours as needed nausea and vertigo 16)  Regranex 0.01 % Gel (Becaplermin) .... Use as directed daily. 17)  Azithromycin 250 Mg Tabs (Azithromycin) .... 2 by mouth today then one by mouth once daily for 4 days  Lipid Assessment/Plan:      Based on NCEP/ATP III, the patient's risk factor category is "history of coronary disease, peripheral vascular disease, cerebrovascular disease, or aortic aneurysm along with either diabetes, current smoker, or LDL > 130 plus HDL < 40 plus triglycerides > 200".  The patient's lipid goals are as follows: Total cholesterol goal is 200; LDL cholesterol goal is 100; HDL cholesterol goal is 40; Triglyceride goal is 150.    Patient Instructions: 1)  Please schedule a follow-up appointment in 3 months .  2)  You need to lose weight. Consider a lower calorie diet and regular exercise.  3)  Check  your blood sugars regularly. If your readings are usually above:  or below 70 you should contact our office.  4)  It is important that your diabetic A1c level is checked every 3 months.  5)  See your eye doctor yearly to check for diabetic eye damage. 6)  Check your feet each night  for sore areas, calluses or signs of infection.

## 2010-06-04 NOTE — Assessment & Plan Note (Signed)
Summary: 2 week follow up/cjr   Vital Signs:  Patient profile:   70 year old male Temp:     98 degrees F oral BP sitting:   130 / 70  (left arm) Cuff size:   large  Vitals Entered By: Sid Falcon LPN (July 08, 1608 8:54 AM) CC: sinus congestion, right foot DM ulcer   History of Present Illness: Followup diabetic toe ulcer. Referral to wound care center but he has not received call for appt yet. He had no drainage from the toe but has had occasional bleeding. Using diabetic walker consistently. No fever or chills. Recent respiratory illness with cough and nasal congestion which is slowly clearing. No fevers. Sugars been stable.  Allergies: 1)  ! Sulfamethoxazole (Sulfamethoxazole) 2)  ! Dilaudid (Hydromorphone Hcl)  Past History:  Past Medical History: Last updated: 10/04/2008 Allergic rhinitis Diabetes mellitus, type II GERD Hyperlipidemia Peripheral neuropathy Renal insufficiency hx diabetic foot ulcers overweight sleep apnea--CPAP acute pancreatitis 10/08 MRSA, hx of  Past Surgical History: Last updated: 10/04/2008 PTCA/stent penile implant PMH reviewed for relevance  Physical Exam  General:  Well-developed,well-nourished,in no acute distress; alert,appropriate and cooperative throughout examination Ears:  External ear exam shows no significant lesions or deformities.  Otoscopic examination reveals clear canals, tympanic membranes are intact bilaterally without bulging, retraction, inflammation or discharge. Hearing is grossly normal bilaterally. Mouth:  Oral mucosa and oropharynx without lesions or exudates.  Teeth in good repair. Neck:  No deformities, masses, or tenderness noted. Lungs:  Normal respiratory effort, chest expands symmetrically. Lungs are clear to auscultation, no crackles or wheezes. Heart:  Normal rate and regular rhythm. S1 and S2 normal without gallop, murmur, click, rub or other extra sounds. Extremities:  right great toe reveals 5 x 7 mm  ulcer which has some calloused tissue around the perimeter which is debrided. Minimal bleeding afterwards. No purulent drainage and no signs of cellulitis   Impression & Recommendations:  Problem # 1:  DIABETIC FOOT ULCER, TOE (ICD-250.80) debrided.  Pt encouraged to still f/u with wound center as this has been difficult to heal for many years, though is slowly improving with more aggressive debridement. His updated medication list for this problem includes:    Lisinopril 10 Mg Tabs (Lisinopril) ..... Once daily    Humulin 70/30 70-30 % Susp (Insulin isophane & regular) ..... Inject 70 units subcutaneously in the morning and 50-60 units in the evening as directed  Complete Medication List: 1)  Omeprazole 20 Mg Cpdr (Omeprazole) .... Two times a day 2)  Gabapentin 800 Mg Tabs (Gabapentin) .... One tab 5 x day 3)  Lisinopril 10 Mg Tabs (Lisinopril) .... Once daily 4)  Furosemide 40 Mg Tabs (Furosemide) .... Once daily 5)  Iodosorb 0.9 % Gel (Cadexomer iodine) .... Use as directed 6)  Oxistat 1 % Crea (Oxiconazole nitrate) .... Use as directed 7)  Fexofenadine Hcl 180 Mg Tabs (Fexofenadine hcl) .... Once daily 8)  Fenofibrate 160 Mg Tabs (Fenofibrate) .... One by mouth once daily 9)  Simvastatin 40 Mg Tabs (Simvastatin) .... One by mouth once daily 10)  1st Choice Pen Needles 31g X 8 Mm Misc (Insulin pen needle) 11)  Humulin 70/30 70-30 % Susp (Insulin isophane & regular) .... Inject 70 units subcutaneously in the morning and 50-60 units in the evening as directed 12)  Relion Insulin Syringe 30g X 5/16" 1 Ml Misc (Insulin syringe-needle u-100) .... Use as directed two times a day 13)  Hydrocodone-homatropine 5-1.5 Mg/71ml Syrp (Hydrocodone-homatropine) .... One  tsp by mouth q 4-6 hours as needed cough 14)  Relion Ultima Test Strp (Glucose blood) .... Check fasting blood sugar in the morning as directed 15)  Meclizine Hcl 25 Mg Tabs (Meclizine hcl) .... One by mouth q 6 hours as needed nausea  and vertigo 16)  Regranex 0.01 % Gel (Becaplermin) .... Use as directed daily. 17)  Amoxicillin 875 Mg Tabs (Amoxicillin) .... One by mouth bid  Patient Instructions: 1)  Please schedule a follow-up appointment in 2 weeks.  2)  Come fasting for followup visit and we will plan to get lab work then.

## 2010-06-04 NOTE — Assessment & Plan Note (Signed)
Summary: 2 wk rov/njr   Vital Signs:  Patient profile:   70 year old male Weight:      294 pounds Temp:     98.0 degrees F oral BP sitting:   140 / 80  (left arm) Cuff size:   large  Vitals Entered By: Sid Falcon LPN (April 06, 2010 9:04 AM)  History of Present Illness: Patient here for the following:  Type 2 diabetes with recent mild cellulitis of great toe. Completed Augmentin. Callus area bottom of toe but no drainage. No fever or chills. He has a neuropathy and has very little sensation in feet or legs.  Dyslipidemia. Recent cholesterol 171 with HDL of 26 and LDL 82. Cost issues with simvastatin. Inquiring about other options.  Recent pancreatitis symptoms resolved.  No recurrent symptoms.  Recurrent right lateral hip pain. No injury. Achy discomfort noted at rest. Prior improvement with steroid injection for bursitis and requesting the same today. No low back pain. No alleviating factors other than some topical heat which is very transient.  Aggravated with walking.  Type 2 diabetes mellitus follow-up      This is a 70 year old man who presents with Type 2 diabetes mellitus follow-up.  The patient complains of numbness of extremities, but denies polyuria, polydipsia, blurred vision, and hypoglycemia requiring help.  The patient denies the following symptoms: chest pain and orthostatic symptoms.  Since the last visit the patient reports compliance with medications and not exercising regularly.  Since the last visit, the patient reports having had eye care by an ophthalmologist.  Complications from diabetes include peripheral neuropathy.    Diabetes Management History:      The patient is a 70 years old male who comes in for evaluation of DM Type 2.  He has not been enrolled in the "Diabetic Education Program".  He is checking home blood sugars.  He says that he is not exercising regularly.    Allergies: 1)  ! Sulfamethoxazole (Sulfamethoxazole) 2)  ! Dilaudid (Hydromorphone  Hcl)  Past History:  Past Medical History: Last updated: 10/04/2008 Allergic rhinitis Diabetes mellitus, type II GERD Hyperlipidemia Peripheral neuropathy Renal insufficiency hx diabetic foot ulcers overweight sleep apnea--CPAP acute pancreatitis 10/08 MRSA, hx of  Past Surgical History: Last updated: 10/04/2008 PTCA/stent penile implant  Social History: Last updated: 07/24/2008 Retired Married Never Smoked Alcohol use-no Regular exercise-no  Risk Factors: Exercise: no (07/24/2008)  Risk Factors: Smoking Status: never (07/24/2008) PMH-FH-SH reviewed for relevance  Review of Systems  The patient denies anorexia, fever, weight loss, weight gain, vision loss, chest pain, syncope, dyspnea on exertion, peripheral edema, prolonged cough, headaches, abdominal pain, melena, and hematochezia.    Physical Exam  General:  Well-developed,well-nourished,in no acute distress; alert,appropriate and cooperative throughout examination Mouth:  Oral mucosa and oropharynx without lesions or exudates.  Teeth in good repair. Neck:  No deformities, masses, or tenderness noted. Lungs:  Normal respiratory effort, chest expands symmetrically. Lungs are clear to auscultation, no crackles or wheezes. Heart:  normal rate and regular rhythm.   Abdomen:  soft and non-tender.   Extremities:  right great toe reveals a callused area underneath. No erythema. Nontender. No drainage. patient has good range of motion right hip. Tenderness over the right greater trochanteric region.  no warmth or signif erythema of toe. Neurologic:  impaired sensation to touch throughout lower extremities Psych:  normally interactive, good eye contact, not anxious appearing, and not depressed appearing.     Impression & Recommendations:  Problem #  1:  HYPERLIPIDEMIA (ICD-272.4) discussed issues with pt.  could go to Pravachol or Mevacor which might help with cost but might sacrifice some control.  He has enough  med for 2-3 months and we elected no changes at this time and readdress in 3 months. His updated medication list for this problem includes:    Fenofibrate 160 Mg Tabs (Fenofibrate) ..... One by mouth once daily    Simvastatin 40 Mg Tabs (Simvastatin) ..... One by mouth once daily  Problem # 2:  BURSITIS, HIP (ICD-726.5)  discussed risks and benefits of steroid injection into R hip trochanteric bursa and pt consents.  Prepped R lateral hip area over greater trochanteric bursa with betadine and using 25 gauge 1 inch needle injected 40 mg depomedrol and 2 cc plain xylocaine without difficulty. Pt tolerated well.  Orders: Joint Aspirate / Injection, Large (20610)  Problem # 3:  CALLUS, TOE (ICD-700) Pt at risk for recurrent ulcer.  Cont diabetic shoes and follow closely. Cellulitis of toe has resolved.  Problem # 4:  DIABETES MELLITUS, TYPE II (ICD-250.00) no hypoglycemia and fair control by home readings. His updated medication list for this problem includes:    Lisinopril 10 Mg Tabs (Lisinopril) ..... Once daily    Humulin 70/30 70-30 % Susp (Insulin isophane & regular) ..... Inject 70 units subcutaneously in the morning and 50-60 units in the evening as directed  Complete Medication List: 1)  Omeprazole 20 Mg Cpdr (Omeprazole) .... Two times a day 2)  Gabapentin 800 Mg Tabs (Gabapentin) .... One tab 5 x day 3)  Lisinopril 10 Mg Tabs (Lisinopril) .... Once daily 4)  Furosemide 40 Mg Tabs (Furosemide) .... Once daily 5)  Oxistat 1 % Crea (Oxiconazole nitrate) .... Use as directed 6)  Fexofenadine Hcl 180 Mg Tabs (Fexofenadine hcl) .... Once daily 7)  Fenofibrate 160 Mg Tabs (Fenofibrate) .... One by mouth once daily 8)  Simvastatin 40 Mg Tabs (Simvastatin) .... One by mouth once daily 9)  1st Choice Pen Needles 31g X 8 Mm Misc (Insulin pen needle) 10)  Humulin 70/30 70-30 % Susp (Insulin isophane & regular) .... Inject 70 units subcutaneously in the morning and 50-60 units in the  evening as directed 11)  Relion Insulin Syringe 30g X 5/16" 1 Ml Misc (Insulin syringe-needle u-100) .... Use as directed two times a day 12)  Relion Ultima Test Strp (Glucose blood) .... Check fasting blood sugar in the morning as directed 13)  Reglan 10 Mg Tabs (Metoclopramide hcl) .... One by mouth qid - a ac meals and q hs as needed nausea and vomiting 14)  Hydrocodone-acetaminophen 5-325 Mg Tabs (Hydrocodone-acetaminophen) .... One to two tabs every 4-6 hours as needed pain 15)  Amoxicillin-pot Clavulanate 875-125 Mg Tabs (Amoxicillin-pot clavulanate) .... One by mouth two times a day for 10 days  Diabetes Management Assessment/Plan:      The following lipid goals have been established for the patient: Total cholesterol goal of 200; LDL cholesterol goal of 100; HDL cholesterol goal of 40; Triglyceride goal of 150.  His blood pressure goal is < 130/80.    Patient Instructions: 1)  Please schedule a follow-up appointment in 3 months . 2)  Ice right hip 2-3 times daily for next couple of days.   Orders Added: 1)  Joint Aspirate / Injection, Large [20610] 2)  Est. Patient Level IV [16109]

## 2010-06-04 NOTE — Assessment & Plan Note (Signed)
Summary: 2 WK ROV/NJR   Vital Signs:  Patient profile:   70 year old male Temp:     98.6 degrees F oral BP sitting:   140 / 70  (left arm) Cuff size:   large  Vitals Entered By: Sid Falcon LPN (June 24, 2009 8:56 AM) CC: 2 week F/U foot ulcer   History of Present Illness: Followup plantar diabetic foot ulcer. Essentially unchanged. Not 100% compliant wearing diabetic shoe.  Has previously been seen at wound care center few years ago and had casting without resolution of the ulcer. This has been present for a couple of years. No purulent drainage. No fever or chills.  CBGs stable.  Regranex prescribed but too expensive for patient.    Allergies: 1)  ! Sulfamethoxazole (Sulfamethoxazole) 2)  ! Dilaudid (Hydromorphone Hcl)  Past History:  Past Medical History: Last updated: 10/04/2008 Allergic rhinitis Diabetes mellitus, type II GERD Hyperlipidemia Peripheral neuropathy Renal insufficiency hx diabetic foot ulcers overweight sleep apnea--CPAP acute pancreatitis 10/08 MRSA, hx of  Review of Systems      See HPI  Physical Exam  General:  Well-developed,well-nourished,in no acute distress; alert,appropriate and cooperative throughout examination Ears:  External ear exam shows no significant lesions or deformities.  Otoscopic examination reveals clear canals, tympanic membranes are intact bilaterally without bulging, retraction, inflammation or discharge. Hearing is grossly normal bilaterally. Nose:  External nasal examination shows no deformity or inflammation. Nasal mucosa are pink and moist without lesions or exudates. Mouth:  Oral mucosa and oropharynx without lesions or exudates.  Teeth in good repair. Neck:  No deformities, masses, or tenderness noted. Lungs:  Normal respiratory effort, chest expands symmetrically. Lungs are clear to auscultation, no crackles or wheezes. Extremities:  great toe reveals ulcer which has some callus tissue around the perimeter  which is again debrided with #15 blade with mild bleeding afterwards. This is trimmed down to the level of the wound. Wound dimensions are 7 x 8 mm. No purulent drainage. No surrounding erythema Neurologic:  impaired sensation to touch feet and lower legs bilaterally.   Impression & Recommendations:  Problem # 1:  DIABETIC FOOT ULCER, TOE (ICD-250.80) Will reconsult with wound care center to see if they have anything else to offer schedule followup 2 weeks His updated medication list for this problem includes:    Lisinopril 10 Mg Tabs (Lisinopril) ..... Once daily    Humulin 70/30 70-30 % Susp (Insulin isophane & regular) ..... Inject 70 units subcutaneously in the morning and 50-60 units in the evening as directed  Complete Medication List: 1)  Omeprazole 20 Mg Cpdr (Omeprazole) .... Two times a day 2)  Gabapentin 800 Mg Tabs (Gabapentin) .... One tab 5 x day 3)  Lisinopril 10 Mg Tabs (Lisinopril) .... Once daily 4)  Furosemide 40 Mg Tabs (Furosemide) .... Once daily 5)  Iodosorb 0.9 % Gel (Cadexomer iodine) .... Use as directed 6)  Oxistat 1 % Crea (Oxiconazole nitrate) .... Use as directed 7)  Fexofenadine Hcl 180 Mg Tabs (Fexofenadine hcl) .... Once daily 8)  Fenofibrate 160 Mg Tabs (Fenofibrate) .... One by mouth once daily 9)  Simvastatin 40 Mg Tabs (Simvastatin) .... One by mouth once daily 10)  1st Choice Pen Needles 31g X 8 Mm Misc (Insulin pen needle) 11)  Humulin 70/30 70-30 % Susp (Insulin isophane & regular) .... Inject 70 units subcutaneously in the morning and 50-60 units in the evening as directed 12)  Relion Insulin Syringe 30g X 5/16" 1 Ml Misc (Insulin syringe-needle  u-100) .... Use as directed two times a day 13)  Hydrocodone-homatropine 5-1.5 Mg/33ml Syrp (Hydrocodone-homatropine) .... One tsp by mouth q 4-6 hours as needed cough 14)  Relion Ultima Test Strp (Glucose blood) .... Check fasting blood sugar in the morning as directed 15)  Meclizine Hcl 25 Mg Tabs  (Meclizine hcl) .... One by mouth q 6 hours as needed nausea and vertigo 16)  Regranex 0.01 % Gel (Becaplermin) .... Use as directed daily.  Patient Instructions: 1)  Please schedule a follow-up appointment in 2 weeks.

## 2010-06-04 NOTE — Progress Notes (Signed)
Summary: lab order request  Phone Note Call from Patient   Caller: Patient Call For: Frank Peat MD Summary of Call: Pt would like to have an order to have labs here from wound center.  [phone]   Wound center would like him to have BMET, and he wants to have the labs that Dr. Caryl Never orders every 90 days at the same time.  Need orders for labs from Dr. Caryl Never , please. Initial call taken by: Lynann Beaver CMA,  July 15, 2009 12:27 PM  Follow-up for Phone Call        Go ahead and add A1C to BMET. Follow-up by: Frank Peat MD,  July 15, 2009 1:24 PM  Additional Follow-up for Phone Call Additional follow up Details #1::        Labs scheduled.  Pt also wants PSA. Additional Follow-up by: Lynann Beaver CMA,  July 15, 2009 3:50 PM

## 2010-06-04 NOTE — Assessment & Plan Note (Signed)
Summary: 2 week fup//ccm   Vital Signs:  Patient profile:   70 year old male Temp:     98.7 degrees F oral BP sitting:   124 / 70  (left arm) Cuff size:   large  Vitals Entered By: Sid Falcon LPN (June 10, 2009 8:53 AM) CC: 2 week follow-up, diabetic ulcer Rt great toe Is Patient Diabetic? Yes Did you bring your meter with you today? No   History of Present Illness: Followup diabetic foot wound right great toe. Present for several years. Showing signs of improvement with debridement. No purulent drainage. Diabetes well controlled with fasting blood sugars generally ranging 70-110. Recent A1c 6.4%. No signs of hypoglycemia.  Allergies: 1)  ! Sulfamethoxazole (Sulfamethoxazole) 2)  ! Dilaudid (Hydromorphone Hcl)  Past History:  Past Medical History: Last updated: 10/04/2008 Allergic rhinitis Diabetes mellitus, type II GERD Hyperlipidemia Peripheral neuropathy Renal insufficiency hx diabetic foot ulcers overweight sleep apnea--CPAP acute pancreatitis 10/08 MRSA, hx of PMH reviewed for relevance  Review of Systems  The patient denies anorexia, fever, chest pain, syncope, dyspnea on exertion, and peripheral edema.    Physical Exam  General:  Well-developed,well-nourished,in no acute distress; alert,appropriate and cooperative throughout examination Lungs:  Normal respiratory effort, chest expands symmetrically. Lungs are clear to auscultation, no crackles or wheezes. Heart:  normal rate and regular rhythm.   Extremities:  right great toe plantar aspect reveals diabetic wound which is now measuring 7 x 10 mm which is improving. Has had some recurrent callus around the periphery and this was again debrided with some minimal bleeding. No purulent drainage. No erythema   Impression & Recommendations:  Problem # 1:  DIABETIC FOOT ULCER, TOE (ICD-250.80) slowly improving but this has been very difficult to resolve. We've recommended a trial of Regranex.  New rx for  diabetic shoes. His updated medication list for this problem includes:    Lisinopril 10 Mg Tabs (Lisinopril) ..... Once daily    Humulin 70/30 70-30 % Susp (Insulin isophane & regular) ..... Inject 70 units subcutaneously in the morning and 50-60 units in the evening as directed  Complete Medication List: 1)  Omeprazole 20 Mg Cpdr (Omeprazole) .... Two times a day 2)  Gabapentin 800 Mg Tabs (Gabapentin) .... One tab 5 x day 3)  Lisinopril 10 Mg Tabs (Lisinopril) .... Once daily 4)  Furosemide 40 Mg Tabs (Furosemide) .... Once daily 5)  Iodosorb 0.9 % Gel (Cadexomer iodine) .... Use as directed 6)  Oxistat 1 % Crea (Oxiconazole nitrate) .... Use as directed 7)  Fexofenadine Hcl 180 Mg Tabs (Fexofenadine hcl) .... Once daily 8)  Fenofibrate 160 Mg Tabs (Fenofibrate) .... One by mouth once daily 9)  Simvastatin 40 Mg Tabs (Simvastatin) .... One by mouth once daily 10)  1st Choice Pen Needles 31g X 8 Mm Misc (Insulin pen needle) 11)  Humulin 70/30 70-30 % Susp (Insulin isophane & regular) .... Inject 70 units subcutaneously in the morning and 50-60 units in the evening as directed 12)  Relion Insulin Syringe 30g X 5/16" 1 Ml Misc (Insulin syringe-needle u-100) .... Use as directed two times a day 13)  Hydrocodone-homatropine 5-1.5 Mg/31ml Syrp (Hydrocodone-homatropine) .... One tsp by mouth q 4-6 hours as needed cough 14)  Relion Ultima Test Strp (Glucose blood) .... Check fasting blood sugar in the morning as directed 15)  Meclizine Hcl 25 Mg Tabs (Meclizine hcl) .... One by mouth q 6 hours as needed nausea and vertigo 16)  Regranex 0.01 % Gel (Becaplermin) .Marland KitchenMarland KitchenMarland Kitchen  Use as directed daily.  Patient Instructions: 1)  Please schedule a follow-up appointment in 2 weeks.  Prescriptions: REGRANEX 0.01 % GEL (BECAPLERMIN) use as directed daily.  #15 gm x 1   Entered and Authorized by:   Evelena Peat MD   Signed by:   Evelena Peat MD on 06/10/2009   Method used:   Electronically to        [facility] [phone]* (retail)       [address] [place], [place]  [address]       Ph: [phone]       Fax: [phone]   RxID:   [id number]

## 2010-06-04 NOTE — Assessment & Plan Note (Signed)
Summary: 2 wk rov/njr   Vital Signs:  Patient profile:   70 year old male Weight:      294 pounds Temp:     98.6 degrees F oral BP sitting:   140 / 72  (left arm) Cuff size:   large  Vitals Entered By: Sid Falcon LPN (March 23, 2010 8:52 AM)  History of Present Illness: Followup from recent abdominal pain and nausea. Symptoms are greatly improved but he still has some pain radiating mid epigastric toward the back. He had elevated lipase and symptoms we felt compatible with acute pancreatitis. Recent ultrasound revealed multiple gallstones but is not tender over his gallbladder area. Reglan has improved his nausea. He had previous episode of acute pancreatitis few years ago of uncertain origin. No alcohol use. No history of recent significant hypertriglyceridemia. He takes fenofibrate for elevated triglycerides.  Type 2 diabetes has been recently well-controlled. History of recurrent right great toe ulcer. Recent appearance of new callus. Has been treated at wound care center. Has noticed some redness past couple days but no drainage. No fever or chills. Has peripheral neuropathy with very little sensation in feet.  Diabetes Management History:      The patient is a 70 years old male who comes in for evaluation of DM Type 2.  He has not been enrolled in the "Diabetic Education Program".  He is checking home blood sugars.  He says that he is not exercising regularly.    Allergies: 1)  ! Sulfamethoxazole (Sulfamethoxazole) 2)  ! Dilaudid (Hydromorphone Hcl)  Past History:  Past Medical History: Last updated: 10/04/2008 Allergic rhinitis Diabetes mellitus, type II GERD Hyperlipidemia Peripheral neuropathy Renal insufficiency hx diabetic foot ulcers overweight sleep apnea--CPAP acute pancreatitis 10/08 MRSA, hx of  Past Surgical History: Last updated: 10/04/2008 PTCA/stent penile implant  Social History: Last updated: 07/24/2008 Retired Married Never Smoked Alcohol  use-no Regular exercise-no  Risk Factors: Exercise: no (07/24/2008)  Risk Factors: Smoking Status: never (07/24/2008) PMH-FH-SH reviewed for relevance  Review of Systems       The patient complains of abdominal pain.  The patient denies fever, weight loss, chest pain, syncope, dyspnea on exertion, peripheral edema, prolonged cough, headaches, hemoptysis, melena, hematochezia, severe indigestion/heartburn, and depression.    Physical Exam  General:  Well-developed,well-nourished,in no acute distress; alert,appropriate and cooperative throughout examination Mouth:  Oral mucosa and oropharynx without lesions or exudates.  Teeth in good repair. Neck:  No deformities, masses, or tenderness noted. Lungs:  Normal respiratory effort, chest expands symmetrically. Lungs are clear to auscultation, no crackles or wheezes. Heart:  normal rate and regular rhythm.   Abdomen:  patient has mild tenderness midepigastric area. No guarding or rebound. No hepatomegaly or splenomegaly noted. No masses palpated. Extremities:  right great toe reveals small callus over the inferior portion of the great toe. He has some mild erythema of all the great toe no drainage. He has no sensation to touch in either foot  Diabetes Management Exam:    Foot Exam (with socks and/or shoes not present):       Sensory-Pinprick/Light touch:          Left medial foot (L-4): absent          Left dorsal foot (L-5): absent          Left lateral foot (S-1): absent          Right medial foot (L-4): absent          Right dorsal foot (L-5): absent  Right lateral foot (S-1): absent       Sensory-Monofilament:          Left foot: absent          Right foot: absent       Inspection:          Left foot: normal          Right foot: abnormal             Comments: callous under R great toe.       Nails:          Left foot: normal          Right foot: normal    Eye Exam:       Eye Exam done here today          Results:  normal   Impression & Recommendations:  Problem # 1:  ACUTE PANCREATITIS (ICD-577.0) symptomatically improved.  ?gallstone pancreatitis.  Pt reluctant to see surgeon at this time. Repeat lipase and check triglycerides. Orders: TLB-Lipase (83690-LIPASE) Venipuncture (16109) Specimen Handling (60454)  Problem # 2:  GALLSTONES (ICD-574.20) as above.  Problem # 3:  CELLULITIS, GREAT TOE (ICD-681.10)  may have very mild cellulitis right great toe. Start Augmentin Follow up 2 weeks.  His updated medication list for this problem includes:    Amoxicillin-pot Clavulanate 875-125 Mg Tabs (Amoxicillin-pot clavulanate) ..... One by mouth two times a day for 10 days  Problem # 4:  DIABETES MELLITUS, TYPE II (ICD-250.00)  His updated medication list for this problem includes:    Lisinopril 10 Mg Tabs (Lisinopril) ..... Once daily    Humulin 70/30 70-30 % Susp (Insulin isophane & regular) ..... Inject 70 units subcutaneously in the morning and 50-60 units in the evening as directed  Problem # 5:  HYPERLIPIDEMIA (ICD-272.4)  His updated medication list for this problem includes:    Fenofibrate 160 Mg Tabs (Fenofibrate) ..... One by mouth once daily    Simvastatin 40 Mg Tabs (Simvastatin) ..... One by mouth once daily  Orders: TLB-Lipid Panel (80061-LIPID) TLB-Hepatic/Liver Function Pnl (80076-HEPATIC) Venipuncture (09811) Specimen Handling (91478)  Complete Medication List: 1)  Omeprazole 20 Mg Cpdr (Omeprazole) .... Two times a day 2)  Gabapentin 800 Mg Tabs (Gabapentin) .... One tab 5 x day 3)  Lisinopril 10 Mg Tabs (Lisinopril) .... Once daily 4)  Furosemide 40 Mg Tabs (Furosemide) .... Once daily 5)  Oxistat 1 % Crea (Oxiconazole nitrate) .... Use as directed 6)  Fexofenadine Hcl 180 Mg Tabs (Fexofenadine hcl) .... Once daily 7)  Fenofibrate 160 Mg Tabs (Fenofibrate) .... One by mouth once daily 8)  Simvastatin 40 Mg Tabs (Simvastatin) .... One by mouth once daily 9)  1st  Choice Pen Needles 31g X 8 Mm Misc (Insulin pen needle) 10)  Humulin 70/30 70-30 % Susp (Insulin isophane & regular) .... Inject 70 units subcutaneously in the morning and 50-60 units in the evening as directed 11)  Relion Insulin Syringe 30g X 5/16" 1 Ml Misc (Insulin syringe-needle u-100) .... Use as directed two times a day 12)  Relion Ultima Test Strp (Glucose blood) .... Check fasting blood sugar in the morning as directed 13)  Reglan 10 Mg Tabs (Metoclopramide hcl) .... One by mouth qid - a ac meals and q hs as needed nausea and vomiting 14)  Hydrocodone-acetaminophen 5-325 Mg Tabs (Hydrocodone-acetaminophen) .... One to two tabs every 4-6 hours as needed pain 15)  Amoxicillin-pot Clavulanate 875-125 Mg Tabs (Amoxicillin-pot clavulanate) .... One by mouth  two times a day for 10 days  Other Orders: Flu Vaccine 39yrs + MEDICARE PATIENTS (Z6109) Administration Flu vaccine - MCR (U0454)  Diabetes Management Assessment/Plan:      The following lipid goals have been established for the patient: Total cholesterol goal of 200; LDL cholesterol goal of 100; HDL cholesterol goal of 40; Triglyceride goal of 150.  His blood pressure goal is < 130/80.    Patient Instructions: 1)  Please schedule a follow-up appointment in 2 weeks.  Prescriptions: REGLAN 10 MG TABS (METOCLOPRAMIDE HCL) one by mouth qid - a AC meals and q hs as needed nausea and vomiting  #30 x 0   Entered and Authorized by:   Evelena Peat MD   Signed by:   Evelena Peat MD on 03/23/2010   Method used:   Electronically to        [facility] [phone]* (retail)       [address] [place], [place]  [address]       Ph: [phone]       Fax: [phone]   RxID:   [id number] AMOXICILLIN-POT CLAVULANATE 875-125 MG TABS (AMOXICILLIN-POT CLAVULANATE) one by mouth two times a day for 10 days  #20 x 0   Entered and Authorized by:   Evelena Peat MD   Signed by:   Evelena Peat MD on 03/23/2010   Method used:    Electronically to        [facility] [phone]* (retail)       [address] [place], [place]  [address]       Ph: [phone]       Fax: [phone]   RxID:   [id number]    Orders Added: 1)  Flu Vaccine 53yrs + MEDICARE PATIENTS [Q2039] 2)  Administration Flu vaccine - MCR [G0008] 3)  TLB-Lipid Panel [80061-LIPID] 4)  TLB-Hepatic/Liver Function Pnl [80076-HEPATIC] 5)  TLB-Lipase [83690-LIPASE] 6)  Venipuncture [35573] 7)  Specimen Handling [99000] 8)  Est. Patient Level IV [22025]    Flu Vaccine Consent Questions     Do you have a history of severe allergic reactions to this vaccine? no    Any prior history of allergic reactions to egg and/or gelatin? no    Do you have a sensitivity to the preservative Thimersol? no    Do you have a past history of Guillan-Barre Syndrome? no    Do you currently have an acute febrile illness? no    Have you ever had a severe reaction to latex? no    Vaccine information given and explained to patient? yes    Are you currently pregnant? no    Lot Number:[id number]   Exp Date:10/31/2010   Site Given  Left Deltoid IMlbmedflu

## 2010-06-04 NOTE — Progress Notes (Signed)
Summary: Pt req antibiotic for ulcerated blister on toe  Phone Note Call from Patient Call back at Home Phone [phone]   Caller: Patient Summary of Call: Pt called and says that another ulcerated blister has appeared on same toe. Pt req antibiotic, Augmentin 800mg . Pls call in to [facility] in [place].  Initial call taken by: Lucy Antigua,  April 17, 2010 2:05 PM  Follow-up for Phone Call        OK to refill Augmentin 875 mg one by mouth two times a day for 10 days and needs follow up next week in no better. Follow-up by: Evelena Peat MD,  April 17, 2010 2:48 PM  Additional Follow-up for Phone Call Additional follow up Details #1::        Rx sent, pt informed  of instructions Additional Follow-up by: Sid Falcon LPN,  April 17, 2010 3:43 PM    New/Updated Medications: AUGMENTIN 875-125 MG TABS (AMOXICILLIN-POT CLAVULANATE) one tab two times a day X 10 days Prescriptions: AUGMENTIN 875-125 MG TABS (AMOXICILLIN-POT CLAVULANATE) one tab two times a day X 10 days  #20 x 0   Entered by:   Sid Falcon LPN   Authorized by:   Evelena Peat MD   Signed by:   Sid Falcon LPN on 14/78/2956   Method used:   Electronically to        [facility] [phone]* (retail)       [address] [place], [place]  [address]       Ph: [phone]       Fax: [phone]   RxID:   [id number]

## 2010-06-04 NOTE — Progress Notes (Signed)
Summary: Pt req pain med for Pancreatitis. Pls call in to [facility]  Phone Note Call from Patient Call back at [facility] Phone [phone]   Caller: Patient Summary of Call: Pt req pain med for Pancreatitus. Pt is a lot of pain. Pls call in to [facility] in [place]. Pls call pt to notify when ready.  Initial call taken by: Lucy Antigua,  March 12, 2010 11:52 AM  Follow-up for Phone Call        We can call in Vicodin 5/325 mg 1-2 by mouth q 4-6 hours as needed #40 with no refill.  If he is unable to keep down fluids, has progressive pain, or any new symptoms such as fever we will need to consider admission. Follow-up by: Evelena Peat MD,  March 12, 2010 12:34 PM  Additional Follow-up for Phone Call Additional follow up Details #1::        Rx called in, pt informed of directives and he voiced his umderstanding Additional Follow-up by: Sid Falcon LPN,  March 12, 2010 3:05 PM    New/Updated Medications: HYDROCODONE-ACETAMINOPHEN 5-325 MG TABS (HYDROCODONE-ACETAMINOPHEN) one to two tabs every 4-6 hours as needed pain Prescriptions: HYDROCODONE-ACETAMINOPHEN 5-325 MG TABS (HYDROCODONE-ACETAMINOPHEN) one to two tabs every 4-6 hours as needed pain  #40 x 0   Entered by:   Sid Falcon LPN   Authorized by:   Evelena Peat MD   Signed by:   Sid Falcon LPN on 09/81/1914   Method used:   Telephoned to ...       [address] [phone]* (retail)       [address] [place], [place]  [address]       Ph: [phone]       Fax: [phone]   RxID:   [id number]

## 2010-06-04 NOTE — Assessment & Plan Note (Signed)
Summary: SORE THROAT/PS   Vital Signs:  Patient profile:   70 year old male Temp:     98.7 degrees F oral BP sitting:   130 / 70  (left arm) Cuff size:   large  Vitals Entered By: Sid Falcon LPN (August 19, 2009 10:04 AM) CC: Sore throat, ear and swollen gland pain   History of Present Illness: Patient seen acute visit sore throat, earache and chest congestion past few days. Question low-grade fever and positive chills past couple days. Cough productive of yellow sputum. No dyspnea. Blood sugar stable. Denies nausea, vomiting, or diarrhea. No sick contacts.  Allergies: 1)  ! Sulfamethoxazole (Sulfamethoxazole) 2)  ! Dilaudid (Hydromorphone Hcl)  Past History:  Past Medical History: Last updated: 10/04/2008 Allergic rhinitis Diabetes mellitus, type II GERD Hyperlipidemia Peripheral neuropathy Renal insufficiency hx diabetic foot ulcers overweight sleep apnea--CPAP acute pancreatitis 10/08 MRSA, hx of PMH reviewed for relevance  Review of Systems      See HPI  Physical Exam  General:  Well-developed,well-nourished,in no acute distress; alert,appropriate and cooperative throughout examination Eyes:  No corneal or conjunctival inflammation noted. EOMI. Perrla. Funduscopic exam benign, without hemorrhages, exudates or papilledema. Vision grossly normal. Ears:  External ear exam shows no significant lesions or deformities.  Otoscopic examination reveals clear canals, tympanic membranes are intact bilaterally without bulging, retraction, inflammation or discharge. Hearing is grossly normal bilaterally. Nose:  minimal clear mucus Mouth:  mild erythema without exudate Neck:  supple no adenopathy Lungs:  Normal respiratory effort, chest expands symmetrically. Lungs are clear to auscultation, no crackles or wheezes. Heart:  Normal rate and regular rhythm. S1 and S2 normal without gallop, murmur, click, rub or other extra sounds.   Impression & Recommendations:  Problem #  1:  UPPER RESPIRATORY INFECTION (ICD-465.9) rapid strep neg. His updated medication list for this problem includes:    Fexofenadine Hcl 180 Mg Tabs (Fexofenadine hcl) ..... Once daily    Hydrocodone-homatropine 5-1.5 Mg/31ml Syrp (Hydrocodone-homatropine) ..... One tsp by mouth q 4-6 hours as needed cough  Orders: Rapid Strep (16109)  Complete Medication List: 1)  Omeprazole 20 Mg Cpdr (Omeprazole) .... Two times a day 2)  Gabapentin 800 Mg Tabs (Gabapentin) .... One tab 5 x day 3)  Lisinopril 10 Mg Tabs (Lisinopril) .... Once daily 4)  Furosemide 40 Mg Tabs (Furosemide) .... Once daily 5)  Iodosorb 0.9 % Gel (Cadexomer iodine) .... Use as directed 6)  Oxistat 1 % Crea (Oxiconazole nitrate) .... Use as directed 7)  Fexofenadine Hcl 180 Mg Tabs (Fexofenadine hcl) .... Once daily 8)  Fenofibrate 160 Mg Tabs (Fenofibrate) .... One by mouth once daily 9)  Simvastatin 40 Mg Tabs (Simvastatin) .... One by mouth once daily 10)  1st Choice Pen Needles 31g X 8 Mm Misc (Insulin pen needle) 11)  Humulin 70/30 70-30 % Susp (Insulin isophane & regular) .... Inject 70 units subcutaneously in the morning and 50-60 units in the evening as directed 12)  Relion Insulin Syringe 30g X 5/16" 1 Ml Misc (Insulin syringe-needle u-100) .... Use as directed two times a day 13)  Hydrocodone-homatropine 5-1.5 Mg/35ml Syrp (Hydrocodone-homatropine) .... One tsp by mouth q 4-6 hours as needed cough 14)  Relion Ultima Test Strp (Glucose blood) .... Check fasting blood sugar in the morning as directed 15)  Meclizine Hcl 25 Mg Tabs (Meclizine hcl) .... One by mouth q 6 hours as needed nausea and vertigo 16)  Regranex 0.01 % Gel (Becaplermin) .... Use as directed daily. 17)  Azithromycin  250 Mg Tabs (Azithromycin) .... 2 by mouth today then one by mouth once daily for 4 days  Patient Instructions: 1)  Please schedule a follow-up appointment in 3 months .  Prescriptions: AZITHROMYCIN 250 MG TABS (AZITHROMYCIN) 2 by mouth  today then one by mouth once daily for 4 days  #6 x 0   Entered and Authorized by:   Evelena Peat MD   Signed by:   Evelena Peat MD on 08/19/2009   Method used:   Electronically to        [facility] [phone]* (retail)       [address] [place], [place]  [address]       Ph: [phone]       Fax: [phone]   RxID:   [id number]   Laboratory Results    Other Tests  Rapid Strep: negative Comments: Rita Ohara  August 19, 2009 10:27 AM   Kit Test Internal QC: Negative   (Normal Range: Negative)

## 2010-06-04 NOTE — Assessment & Plan Note (Signed)
Summary: 1 month rov/njr   Vital Signs:  Patient profile:   70 year old male Temp:     98.6 degrees F oral BP sitting:   130 / 72  (left arm) Cuff size:   large  Vitals Entered By: Sid Falcon LPN (May 27, 2009 8:33 AM) CC: 1 month follow-up Is Patient Diabetic? Yes Did you bring your meter with you today? No   History of Present Illness: Followup diabetic foot ulcer. Patient consistently using rocker-bottom shoe. No drainage. Continues to use Iodosorb regularly. No redness. No foul odor. For repeat debridement and reevaluation. Previously 1.2 x 1 cm in size. Diabetes has been well-controlled insulin with recent A1c 6.4%  Allergies: 1)  ! Sulfamethoxazole (Sulfamethoxazole) 2)  ! Dilaudid (Hydromorphone Hcl)  Past History:  Past Medical History: Last updated: 10/04/2008 Allergic rhinitis Diabetes mellitus, type II GERD Hyperlipidemia Peripheral neuropathy Renal insufficiency hx diabetic foot ulcers overweight sleep apnea--CPAP acute pancreatitis 10/08 MRSA, hx of  Social History: Last updated: 07/24/2008 Retired Married Never Smoked Alcohol use-no Regular exercise-no  Review of Systems      See HPI  Physical Exam  General:  Well-developed,well-nourished,in no acute distress; alert,appropriate and cooperative throughout examination Lungs:  Normal respiratory effort, chest expands symmetrically. Lungs are clear to auscultation, no crackles or wheezes. Heart:  normal rate and regular rhythm.   Pulses:  2+ dorsalis pedis and posterior tibial pulse right foot. Extremities:  right great toe plantar surface reveals ulcer which is 1 x 1 cm and 3-4 mm depth. Iodosorb in center of wound. No purulent drainage. No erythema. Use #15 blade and debrided around the wound. Minimal bleeding.  No other foot ulcers noted. Neurologic:  severe sensory impairment foot to touch and monofilament   Impression & Recommendations:  Problem # 1:  DIABETIC FOOT ULCER, TOE  (ICD-250.80) overall minimally decreased in size. Bring back in 2 weeks for repeat debridement. He has had none contact casting in the past which did not help much His updated medication list for this problem includes:    Lisinopril 10 Mg Tabs (Lisinopril) ..... Once daily    Humulin 70/30 70-30 % Susp (Insulin isophane & regular) ..... Inject 70 units subcutaneously in the morning and 50-60 units in the evening as directed  Complete Medication List: 1)  Omeprazole 20 Mg Cpdr (Omeprazole) .... Two times a day 2)  Gabapentin 800 Mg Tabs (Gabapentin) .... One tab 5 x day 3)  Lisinopril 10 Mg Tabs (Lisinopril) .... Once daily 4)  Furosemide 40 Mg Tabs (Furosemide) .... Once daily 5)  Iodosorb 0.9 % Gel (Cadexomer iodine) .... Use as directed 6)  Oxistat 1 % Crea (Oxiconazole nitrate) .... Use as directed 7)  Fexofenadine Hcl 180 Mg Tabs (Fexofenadine hcl) .... Once daily 8)  Fenofibrate 160 Mg Tabs (Fenofibrate) .... One by mouth once daily 9)  Simvastatin 40 Mg Tabs (Simvastatin) .... One by mouth once daily 10)  1st Choice Pen Needles 31g X 8 Mm Misc (Insulin pen needle) 11)  Humulin 70/30 70-30 % Susp (Insulin isophane & regular) .... Inject 70 units subcutaneously in the morning and 50-60 units in the evening as directed 12)  Relion Insulin Syringe 30g X 5/16" 1 Ml Misc (Insulin syringe-needle u-100) .... Use as directed two times a day 13)  Hydrocodone-homatropine 5-1.5 Mg/23ml Syrp (Hydrocodone-homatropine) .... One tsp by mouth q 4-6 hours as needed cough 14)  Relion Ultima Test Strp (Glucose blood) .... Check fasting blood sugar in the morning as directed 15)  Meclizine Hcl 25 Mg Tabs (Meclizine hcl) .... One by mouth q 6 hours as needed nausea and vertigo  Patient Instructions: 1)  Leave off Iodosorb for now. 2)  Please schedule a follow-up appointment in 2 weeks.  3)  follow up immediately if he notices any redness or pus like drainage from foot

## 2010-06-04 NOTE — Progress Notes (Signed)
  Phone Note Outgoing Call   Summary of Call: spoke with pt.  Pain unchanged.  No fever and no vomiting.  Keeping down clear fluids. I have rec to pt that we get abdominal ultrasound to furher evaluate and he agrees. Initial call taken by: Evelena Peat MD,  March 12, 2010 3:54 PM  Follow-up for Phone Call        Faxed order to [facility].  They will call pt directly to set up [place] asap. Follow-up by: Corky Mull,  March 13, 2010 8:33 AM  New Problems: ACUTE PANCREATITIS (ICD-577.0)   New Problems: ACUTE PANCREATITIS (ICD-577.0)

## 2010-06-09 ENCOUNTER — Encounter: Payer: Self-pay | Admitting: *Deleted

## 2010-06-10 ENCOUNTER — Ambulatory Visit (INDEPENDENT_AMBULATORY_CARE_PROVIDER_SITE_OTHER): Payer: Medicare Other | Admitting: Family Medicine

## 2010-06-10 ENCOUNTER — Encounter: Payer: Self-pay | Admitting: Family Medicine

## 2010-06-10 DIAGNOSIS — E1169 Type 2 diabetes mellitus with other specified complication: Secondary | ICD-10-CM

## 2010-06-10 DIAGNOSIS — F419 Anxiety disorder, unspecified: Secondary | ICD-10-CM

## 2010-06-10 DIAGNOSIS — M76899 Other specified enthesopathies of unspecified lower limb, excluding foot: Secondary | ICD-10-CM

## 2010-06-10 DIAGNOSIS — G609 Hereditary and idiopathic neuropathy, unspecified: Secondary | ICD-10-CM

## 2010-06-10 DIAGNOSIS — E119 Type 2 diabetes mellitus without complications: Secondary | ICD-10-CM

## 2010-06-10 DIAGNOSIS — M7070 Other bursitis of hip, unspecified hip: Secondary | ICD-10-CM

## 2010-06-10 DIAGNOSIS — N189 Chronic kidney disease, unspecified: Secondary | ICD-10-CM

## 2010-06-10 LAB — BASIC METABOLIC PANEL
CO2: 29 mEq/L (ref 19–32)
Chloride: 103 mEq/L (ref 96–112)
Creatinine, Ser: 2.2 mg/dL — ABNORMAL HIGH (ref 0.4–1.5)
Glucose, Bld: 321 mg/dL — ABNORMAL HIGH (ref 70–99)
Sodium: 140 mEq/L (ref 135–145)

## 2010-06-10 MED ORDER — ALPRAZOLAM 0.5 MG PO TABS: 0.5000 mg | ORAL_TABLET | Freq: Three times a day (TID) | ORAL | Status: DC | PRN

## 2010-06-10 NOTE — Progress Notes (Signed)
  Subjective:    Patient ID: Frank Ashley, male    DOB: Nov 19, 1940, 70 y.o.   MRN: [id number]  HPI  Patient seen for followup of multiple medical problems. He has history of type 2 diabetes, obesity, diabetic peripheral neuropathy, chronic callus and recurrent ulcer right toe ulcer, hyperlipidemia, GERD, history of recurrent pancreatitis, chronic kidney disease , and obstructive sleep apnea.   Diabetes somewhat poorly controlled. Poor compliance with diet. Recent fasting blood sugars occasionally over 200. Takes novolin insulin 70/30 70 80 units twice daily. No hypoglycemia. Occasional thirst. No significant weight changes.   left lateral hip pain for 2 weeks. No injury. Achy quality of pain. Better with movement and icing. Similar problem right hip which resolved with steroid injection. Requesting the same today.    history hyperlipidemia. Treated with simvastatin and fenofibrate. Compliant with medications. No side effects. ,   new problem of increased anxiety symptoms which come intermittently without clear provocation. Similar panic type symptoms in the past when treated with bariatric  Treatment.  Denies depressive symptoms.  No alcohol use.  Review of Systems   Patient denies any headaches. No chest pains. No dyspnea with activity. Denies any dysphagia   change of appetite, abdominal pain, stool changes, or change in urinary symptoms    Objective:   Physical Exam      alert pleasant obese gentleman in no distress  oropharynx no lesions Neck no mass  Chest clear to auscultation  Heart regular rhythm and rate  Abdomen soft nontender  Left hip tender to palpation greater trochanteric bursa.  Extremities no edema. Callus right great toe ventral surface. No ulceration Psch-pt alert and oriented with normal mood and interaction.    Assessment & Plan:   #1 type 2 diabetes with recent poor control. Reassess A1c today  #2 chronic callus right great toe no evidence for ulceration  #3 hyperlipidemia  #4 bursitis left hip  #5 unspecified anxiety. Symptoms seem to come on unpredictably. No history of panic attacks   #6 chronic kidney disease   Obtain hemoglobin A1c and basic metabolic panel. Stressed importance of dietary compliance with diabetes. Continue to watch the closely. Low-dose alprazolam for rare symptoms of severe anxiety.   Discussed risk and benefits of corticosteroid injection left hip and patient consented. Left hip prepped with Betadine over greater trochanteric bursa region. Using a #1 inch 25-gauge needle injected 40 mg Depo-Medrol and 2 cc of plain Xylocaine the patient tolerated well.

## 2010-06-11 ENCOUNTER — Telehealth: Payer: Self-pay | Admitting: *Deleted

## 2010-06-11 DIAGNOSIS — N189 Chronic kidney disease, unspecified: Secondary | ICD-10-CM

## 2010-06-11 NOTE — Telephone Encounter (Signed)
I called pt and sch pt for repeat bmp on 06/18/10 at 9:30am, as noted.   Per Harriett Sine, disregard recheck b-12.

## 2010-06-11 NOTE — Telephone Encounter (Signed)
Pt has been taking potassium, 1/2 tab daily as directed after pancreatitis (pt unsure of the dose).  Per Dr Caryl Never, D/C potassium and re-check BMP in one week.  Order made, office will call pt back to schedule lab visit only

## 2010-06-18 ENCOUNTER — Other Ambulatory Visit (INDEPENDENT_AMBULATORY_CARE_PROVIDER_SITE_OTHER): Payer: Medicare Other | Admitting: Family Medicine

## 2010-06-18 DIAGNOSIS — T887XXA Unspecified adverse effect of drug or medicament, initial encounter: Secondary | ICD-10-CM

## 2010-06-18 DIAGNOSIS — N189 Chronic kidney disease, unspecified: Secondary | ICD-10-CM

## 2010-06-18 LAB — BASIC METABOLIC PANEL
BUN: 41 mg/dL — ABNORMAL HIGH (ref 6–23)
CO2: 29 mEq/L (ref 19–32)
Glucose, Bld: 296 mg/dL — ABNORMAL HIGH (ref 70–99)
Potassium: 5.3 mEq/L — ABNORMAL HIGH (ref 3.5–5.1)
Sodium: 141 mEq/L (ref 135–145)

## 2010-06-19 NOTE — Progress Notes (Signed)
I can't close without putting a note here.

## 2010-06-29 ENCOUNTER — Other Ambulatory Visit: Payer: Self-pay | Admitting: Family Medicine

## 2010-06-29 DIAGNOSIS — E785 Hyperlipidemia, unspecified: Secondary | ICD-10-CM

## 2010-06-29 DIAGNOSIS — I10 Essential (primary) hypertension: Secondary | ICD-10-CM

## 2010-07-11 ENCOUNTER — Other Ambulatory Visit: Payer: Self-pay | Admitting: Family Medicine

## 2010-07-13 ENCOUNTER — Ambulatory Visit: Payer: Self-pay | Admitting: Family Medicine

## 2010-07-20 LAB — GLUCOSE, CAPILLARY
Glucose-Capillary: 144 mg/dL — ABNORMAL HIGH (ref 70–99)
Glucose-Capillary: 203 mg/dL — ABNORMAL HIGH (ref 70–99)
Glucose-Capillary: 207 mg/dL — ABNORMAL HIGH (ref 70–99)
Glucose-Capillary: 224 mg/dL — ABNORMAL HIGH (ref 70–99)
Glucose-Capillary: 231 mg/dL — ABNORMAL HIGH (ref 70–99)
Glucose-Capillary: 232 mg/dL — ABNORMAL HIGH (ref 70–99)
Glucose-Capillary: 236 mg/dL — ABNORMAL HIGH (ref 70–99)
Glucose-Capillary: 238 mg/dL — ABNORMAL HIGH (ref 70–99)
Glucose-Capillary: 251 mg/dL — ABNORMAL HIGH (ref 70–99)
Glucose-Capillary: 257 mg/dL — ABNORMAL HIGH (ref 70–99)
Glucose-Capillary: 276 mg/dL — ABNORMAL HIGH (ref 70–99)
Glucose-Capillary: 278 mg/dL — ABNORMAL HIGH (ref 70–99)
Glucose-Capillary: 292 mg/dL — ABNORMAL HIGH (ref 70–99)
Glucose-Capillary: 300 mg/dL — ABNORMAL HIGH (ref 70–99)
Glucose-Capillary: 302 mg/dL — ABNORMAL HIGH (ref 70–99)
Glucose-Capillary: 328 mg/dL — ABNORMAL HIGH (ref 70–99)
Glucose-Capillary: 355 mg/dL — ABNORMAL HIGH (ref 70–99)
Glucose-Capillary: 367 mg/dL — ABNORMAL HIGH (ref 70–99)
Glucose-Capillary: 557 mg/dL (ref 70–99)
Glucose-Capillary: 585 mg/dL (ref 70–99)

## 2010-07-21 LAB — GLUCOSE, CAPILLARY
Glucose-Capillary: 100 mg/dL — ABNORMAL HIGH (ref 70–99)
Glucose-Capillary: 101 mg/dL — ABNORMAL HIGH (ref 70–99)
Glucose-Capillary: 119 mg/dL — ABNORMAL HIGH (ref 70–99)
Glucose-Capillary: 125 mg/dL — ABNORMAL HIGH (ref 70–99)
Glucose-Capillary: 126 mg/dL — ABNORMAL HIGH (ref 70–99)
Glucose-Capillary: 126 mg/dL — ABNORMAL HIGH (ref 70–99)
Glucose-Capillary: 143 mg/dL — ABNORMAL HIGH (ref 70–99)
Glucose-Capillary: 149 mg/dL — ABNORMAL HIGH (ref 70–99)
Glucose-Capillary: 149 mg/dL — ABNORMAL HIGH (ref 70–99)
Glucose-Capillary: 173 mg/dL — ABNORMAL HIGH (ref 70–99)
Glucose-Capillary: 183 mg/dL — ABNORMAL HIGH (ref 70–99)
Glucose-Capillary: 230 mg/dL — ABNORMAL HIGH (ref 70–99)
Glucose-Capillary: 236 mg/dL — ABNORMAL HIGH (ref 70–99)
Glucose-Capillary: 242 mg/dL — ABNORMAL HIGH (ref 70–99)
Glucose-Capillary: 271 mg/dL — ABNORMAL HIGH (ref 70–99)
Glucose-Capillary: 99 mg/dL (ref 70–99)

## 2010-08-03 ENCOUNTER — Telehealth: Payer: Self-pay | Admitting: *Deleted

## 2010-08-03 MED ORDER — OMEPRAZOLE 40 MG PO CPDR: 40.0000 mg | DELAYED_RELEASE_CAPSULE | Freq: Every day | ORAL | Status: DC

## 2010-08-03 NOTE — Telephone Encounter (Signed)
Notified pt. 

## 2010-08-03 NOTE — Telephone Encounter (Signed)
Pt has run out of Omeprazole early due to severe stomach pain and vomiting and refuses office visit.  Has been taking Alka Seltza Plus and states he will get meds OTC if we don't refill early?  Advised if he has the history of 3 surgeries and multiple GI ulcers, he should see Dr. Caryl Never.

## 2010-08-03 NOTE — Telephone Encounter (Signed)
OK to  Refill his Omeprazole.  Note implies he is taking more frequently than usual.  If symptoms not controlled on 2 omeprazole daily, he needs further evaluation to see what is going on.

## 2010-08-17 ENCOUNTER — Other Ambulatory Visit: Payer: Self-pay | Admitting: Family Medicine

## 2010-09-14 ENCOUNTER — Ambulatory Visit: Payer: Self-pay | Admitting: Family Medicine

## 2010-09-15 ENCOUNTER — Ambulatory Visit (INDEPENDENT_AMBULATORY_CARE_PROVIDER_SITE_OTHER): Payer: Medicare Other | Admitting: Family Medicine

## 2010-09-15 ENCOUNTER — Encounter: Payer: Self-pay | Admitting: Family Medicine

## 2010-09-15 DIAGNOSIS — E1169 Type 2 diabetes mellitus with other specified complication: Secondary | ICD-10-CM

## 2010-09-15 DIAGNOSIS — G609 Hereditary and idiopathic neuropathy, unspecified: Secondary | ICD-10-CM

## 2010-09-15 DIAGNOSIS — N189 Chronic kidney disease, unspecified: Secondary | ICD-10-CM

## 2010-09-15 DIAGNOSIS — E119 Type 2 diabetes mellitus without complications: Secondary | ICD-10-CM

## 2010-09-15 LAB — BASIC METABOLIC PANEL
BUN: 31 mg/dL — ABNORMAL HIGH (ref 6–23)
CO2: 28 mEq/L (ref 19–32)
Calcium: 9 mg/dL (ref 8.4–10.5)
GFR: 34.58 mL/min — ABNORMAL LOW (ref >60.00)
Glucose, Bld: 162 mg/dL — ABNORMAL HIGH (ref 70–99)
Sodium: 141 mEq/L (ref 135–145)

## 2010-09-15 NOTE — H&P (Signed)
NAME:  Frank Ashley, Frank Ashley NO.:  [id number]   MEDICAL RECORD NO.:  [id number]          PATIENT TYPE:  EMS   LOCATION:  MAJO                         FACILITY:  [facility]   PHYSICIAN:  Marcellus Scott, MD     DATE OF BIRTH:  [birth year]   DATE OF ADMISSION:  02/02/2007  DATE OF DISCHARGE:                              HISTORY & PHYSICAL   PRIMARY MEDICAL DOCTOR:  Evelena Peat, M.D. of [facility].   CHIEF COMPLAINT:  Abdominal pain, nausea and vomiting.   HISTORY OF PRESENT ILLNESS:  Frank Ashley is a pleasant 70 year old  Caucasian male patient with past medical history as indicated below.  He  says he has been having intermittent left-sided abdominal pain for  almost a year but more frequently since approximately three months.  He  brought this to the attention of his primary medical doctor and was in  the process of evaluation, which is yet to be done.  In any case since  approximately 1:00 p.m. yesterday, the patient noticed the subacute  onset of mid abdominal pain, which initially started as a sharp pain but  since then feels like somebody has hit him in the abdomen.  Since then,  the patient's pain has also increased in severity to a 10/10 with  diffuse pain all over the abdomen.  This has been associated with  heaves, intermittent nausea, and 2-3 episodes of vomiting.  It is  unclear if there is a streak of blood or a tiny clot and one episode of  vomiting.  The patient's last BM was yesterday.  He has had no fever,  chills, or rigors.  Patient denies a history of alcohol abuse or a  history of using NSAIDs.  Patient used some Catering manager and Pepto-  Bismol last night with no significant effect.  For this presentation,  the patient was assessed at the primary medical doctor's office and sent  to the emergency room for evaluation and management.   PAST MEDICAL HISTORY:  1. Diabetes.  2. Hyperlipidemia.  3. Coronary artery disease, status post PCI  and stent 10 years ago.  4. Obstructive sleep apnea syndrome.  Has not used a CPAP for a year      because of an ill-fitting mask.  5. CKD with baseline creat 2 according to PMD's note.   PAST SURGICAL HISTORY:  1. Perforated gastric ulcer and laparotomy for the same.  2. Partial gastrectomy.   ALLERGIES:  SULFA.   MEDICATIONS:  1. Lasix 80 mg p.o. daily.  2. Lisinopril 10 mg p.o. daily.  3. Protonix 40 mg p.o. daily.  4. Neurontin 800 mg p.o. b.i.d.  5. Lipitor 40 mg p.o. daily.  6. Tricor 145 mg p.o. daily.  7. Metformin 500 mg, 2 p.o. b.i.d.  8. Amaryl 4 mg p.o. b.i.d.   FAMILY HISTORY:  Noncontributory.   SOCIAL HISTORY:  Patient is married.  Spouse is at the bedside.  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  Patient quit smoking  at age 51-24 years.  No history of alcohol or  drug abuse.   REVIEW OF SYSTEMS:  Fourteen systems reviewed and apart from the history  of present illness, is noncontributory.   PHYSICAL EXAMINATION:  Frank Ashley is a moderately built and obese patient  in intermittent painful but no cardiopulmonary distress.  VITAL SIGNS:  Temperature 98.6 degrees Fahrenheit, pulse 82 per minute  and regular, respirations 20 per minute, saturating at 98% on room air,  blood pressure 111/64.  HEENT:  Normocephalic and atraumatic.  Pupils are equal and reactive to  light and accommodation.  Oral cavity with enlarged bilateral tonsils  which are not inflamed, and there is narrowing of the posterior  pharyngeal wall.  NECK:  Thick without JVD, carotid bruit, lymphadenopathy, or goiter.  Supple.  LYMPHATICS:  No lymphadenopathy.  RESPIRATORY:  Clear to auscultation bilaterally.  CARDIOVASCULAR:  First and second heart sounds heard.  No third or  fourth heart sounds, murmurs, rubs, gallops, or clicks.  ABDOMEN:  Obese.  Has multiple extensive laparotomy scars.  Diffuse mild  tenderness.  No rebound, guarding, or rigidity.  No organomegaly or mass   appreciated.  Bowel sounds are normally heard.  CENTRAL NERVOUS SYSTEM:  Patient is awake, alert and oriented x3 with no  focal neurological deficits.  EXTREMITIES:  No clubbing, cyanosis or edema.  Peripheral pulses are  symmetrically felt.   LAB DATA:  Urinalysis is negative for features of UTI.  Lipase is 244.  Comprehensive metabolic panel remarkable for potassium of 3.4, bicarb  31, sodium 139, chloride 98, BUN 25, creatinine 1.96, alk phos 31, AST  25, ALT 18, total protein 6.7, albumin 3.7, calcium 8.7, total bilirubin  1.  CBC with hemoglobin of 13.3, hematocrit 38.9, white blood cells  12.9, platelets 264.   CT of the abdomen.  Impression:  Acute pancreatitis.  No pseudocyst or  abscess at this time.  Multiple small gallstones.  No bile duct  dilatation.   Acute abdominal series with chest, reviewed by me.  The report  preliminarily is mild atelectasis of the left base.  Nonspecific small  and transverse colon distention, consistent with ileus.   IMPRESSION:  1. Acute pancreatitis:  Etiology unclear.  Differential includes      hyperlipidemia, gallstones disease; however, hepatic panel is      normal, and there is no biliary dilatation.  We will admit patient      to a telemetry bed.  We will make him n.p.o. except sips, chips,      and meds.  We will place the patient on IV fluids and pain      medications.  Will check fasting lipids.  2. Ileus, small bowel and transverse colon secondary to acute      pancreatitis.  Will make n.p.o., provide IV fluids, and follow up,      repeat x-ray in the a.m.  3. Questionable minimal upper gastrointestinal bleed secondary to      vomiting:  Place patient on PPI and monitor.  4. Diabetes:  Hypoglycemic. To hold patient's oral hypoglycemic agents      and place him on D5W and frequent CBG checks.  5. Hyperlipidemia:  Will hold his lipid-lowering agent secondary to      his acute pancreatitis at this time and check fasting lipids.  6.  Coronary artery disease, status post stents:  Patient is      asymptomatic of any chest pain at this time.  7. Obstructive sleep apnea syndrome:  For CPAP per respiratory  therapy.  8. Acute on CKD secondary to dehydration, ACE inhibitors.  Will hold      the patient's Lasix and ACE inhibitors and will hydrate and follow      up patient's basic metabolic panel.  9. Hypokalemia:  To replete and follow up.  10.Leukocytosis.  11.Obesity.      Marcellus Scott, MD  Electronically Signed     AH/MEDQ  D:  02/02/2007  T:  02/02/2007  Job:  [id number]   cc:   Evelena Peat, M.D.

## 2010-09-15 NOTE — Progress Notes (Signed)
Subjective:    Patient ID: Frank Ashley, male    DOB: 04-29-1941, 70 y.o.   MRN: [id number]  HPI Patient has multiple chronic problems including obesity, type 2 diabetes, peripheral neuropathy, hyperlipidemia, history of pancreatitis, gallstones, and chronic kidney disease. Has lost about 8 pounds since last visit. No lifestyle changes. Fasting blood sugars around 130 and evening blood sugars around 250.   Is on Novolin 70/30 insulin 70 units twice daily. No hypoglycemic symptoms.  Recent potassium 5.3. Baseline creatinine around 2.2. Had recent episode of midepigastric pain for about 5 days. Known gallstones but no history of known acute cholecystitis. Does have history of recurrent pancreatitis.  History peripheral neuropathy. Recurrent right great toe plantar ulcer which he first noticed couple weeks ago. No purulent drainage. No foul odor. Wears diabetic shoes.  Past Medical History  Diagnosis Date  . Allergy   . Diabetes mellitus     Type 2  . GERD (gastroesophageal reflux disease)   . Hyperlipidemia   . Peripheral neuropathy     peripheral neuropathy  . Renal insufficiency   . Diabetic foot ulcers   . Overweight   . Sleep apnea     CPAP  . Pancreatitis 02/2007    ACUTE  . MRSA (methicillin resistant Staphylococcus aureus)    Past Surgical History  Procedure Date  . Coronary angioplasty with stent placement   . Penile prosthesis implant     reports that he has never smoked. He has never used smokeless tobacco. He reports that he does not drink alcohol. His drug history not on file. family history is not on file. Allergies  Allergen Reactions  . Hydrocodone   . Sulfamethoxazole     REACTION: hives     Review of Systems  Constitutional: Negative for fever, chills, activity change and appetite change.  Respiratory: Negative for cough, shortness of breath and wheezing.   Cardiovascular: Negative for chest pain, palpitations and leg swelling.  Gastrointestinal:  Negative for abdominal pain, blood in stool and abdominal distention.  Genitourinary: Negative for dysuria.  Neurological: Negative for dizziness, syncope, light-headedness and headaches.  Psychiatric/Behavioral: Negative for dysphoric mood.       Objective:   Physical Exam  Constitutional: He is oriented to person, place, and time. He appears well-developed and well-nourished. No distress.  HENT:  Right Ear: External ear normal.  Left Ear: External ear normal.  Mouth/Throat: Oropharynx is clear and moist.  Cardiovascular: Normal rate and regular rhythm.   Pulmonary/Chest: Effort normal and breath sounds normal. No respiratory distress. He has no wheezes. He has no rales.  Abdominal: Soft. There is no tenderness.  Musculoskeletal:       Right great toe plantar surface reveals a 4 x 5 mm ulcer which is about 2 mm deep. No purulent drainage. No surrounding erythema.  Neurological: He is alert and oriented to person, place, and time. No cranial nerve deficit.       Sensory impairment to touch feet and lower legs bilaterally  Psychiatric: He has a normal mood and affect.          Assessment & Plan:  #1 type 2 diabetes with recent poor control. Reassess A1c. He inquires regarding medications such as Victoza and we recommended against with history of pancreatitis #2 recurrentight great toe plantar ulcer r. Referral to wound treatment center. Consider change of shoes #3 recurrent midepigastric pain. Differential is acute pancreatitis versus gallbladder related. Currently asymptomatic. Follow up promptly if recurs #4 chronic kidney disease. Recent mild  elevated potassium. Patient had been on potassium supplement and is now off. Reassess today.

## 2010-09-15 NOTE — Discharge Summary (Signed)
NAME:  Frank Ashley, Frank Ashley NO.:  [id number]   MEDICAL RECORD NO.:  [id number]          PATIENT TYPE:  INP   LOCATION:  4711                         FACILITY:  [facility]   PHYSICIAN:  Marcellus Scott, MD     DATE OF BIRTH:  11-Apr-1941   DATE OF ADMISSION:  02/02/2007  DATE OF DISCHARGE:  02/06/2007                               DISCHARGE SUMMARY   Date of discharge: 02/07/2007   PRIMARY MEDICAL DOCTOR:  Dr. Evelena Peat of [facility]   DISCHARGE DIAGNOSES:  1. Acute pancreatitis.  2. Small bowel and colonic ileus, resolved.  3. Hypophosphatemia and hypocalcemia.  4. Type 2 diabetes with history of hypoglycemia.  5. Hyperlipidemia.  6. Chronic kidney disease.  7. Anemia.  8. Coronary artery disease status post PCI.  9. Obstructive sleep apnea syndrome - noncompliant with nasal CPAP at      home.  10.Cholelithiasis.   DISCHARGE MEDICATIONS:  1. Neurontin 800 mg p.o. three times a day.  2. Senokot-S one p.o. q.h.s.  3. Protonix 40 mg p.o. daily.  4. K-Phos Neutral two tabs p.o. q.i.d. for three days.  5. Os-Cal 500 two p.o. b.i.d. for three days.  6. Lisinopril 10 mg p.o. daily.  7. Oxycodone 5 mg p.o. q.4 hourly p.r.n.  8. Tylenol 650 mg p.o. q.4-6 hourly p.r.n.  9. Amaryl 2mg  p.o b.i.d   MEDICATIONS THAT ARE HELD:  Are:  1. Lipitor  2. Tricor.  3. Metformin.  4. Lasix.   PROCEDURES:  1. X-ray of the abdomen on October 4.  Impression:  Stable examination      with findings compatible with generalize ileus.  No acute findings.  2. Ultrasound of the abdomen.  Impression:  Cholelithiasis without      other ultrasound evidence of cholecystitis or ductal obstruction.  3. CT of the abdomen without contrast on February 05, 2007.  Impression:      A) Peripancreatic inflammatory changes without evidence of      pseudocyst or other complications of pancreatitis.  B) Probable      cholelithiasis.  C) Small bilateral pleural effusions.  D) Coronary      calcifications.  4. CT of the pelvis without contrast.  Impression:  A) Trace pelvic      ascites.  B) Otherwise stable appearance compared to previous exam.  5. Acute abdominal series on October 2.  Impression:  A) Shallow lung      volumes with left lower lobe atelectasis.  B) Findings compatible      with small and large-bowel ileus, likely related to pancreatic      inflammation.  6. CT of the abdomen without contrast on October 2.  Impression:  A)      Findings consistent with acute pancreatitis - no abscess or      pseudocyst at this time.  B) There are gallstones.  7. Pelvic CT without contrast.  Impression:  No acute findings in the      pelvis.  8. Acute abdominal series with chest on October 2.  Impression:      Nonspecific bowel  distention.  At this point, I would favor      generalize ileus.  Mild atelectasis at the left base.   PERTINENT LABS:  Blood cultures negative to date; the final cultures to  be followed up as an outpatient.  Phosphorous is 1.6.  Basic metabolic  panel with BUN of 13, creatinine of 1.54, calcium 7.3.  Albumin on  October 4 was 2.8.  CBC with hemoglobin 9.6, hematocrit 28.3, white  blood cells 7.8, platelets 206.  Hemoglobin A1c of 7.7.  Urinalysis  negative for features suggestive of UTI.  Cardiac enzymes negative.  Lipase on admission was 244 which came down to 85 the next day.  Hepatic  functions have been normal except albumin being low.   CONSULTATIONS:  Gastroenterology from Dr. Lina Sar of [facility].   HOSPITAL COURSE/PATIENT DISPOSITION:  For details of initial admission,  please refer to the history and physical note.   In summary, Mr. Frank Ashley is a pleasant 70 year old male patient with  history of diabetes type 2, hyperlipidemia, coronary artery disease  status post PCI, obstructive sleep apnea syndrome noncompliant with  CPAP, obesity, who presented with abdominal pain, nausea, vomiting.  Further evaluation  revealed acute pancreatitis.  Patient was admitted to  a telemetry bed.  He was initially made nil per oral except meds, sips  and chips. He was intravenously hydrated, provided with intravenous pain  medications.  His lipase and hepatic panel were serially followed.  Patient also had small and large-bowel ileus.  Over the next couple of  days, patient continued to have significant amount of pain and  persisting ileus.  GI was consulted.  His IV fluids were increased  because patient was still thought to be dehydrated.  His pain  medications were also titrated up.  Patient subsequently started having  BMs with improvement in his abdominal pain.  Today, he indicates his  abdominal pain is 85% better.  Patient was started on a clear liquid  diet which was advanced to a full liquid diet which he has tolerated.  Patient also was febrile which was most likely secondary to SIRS  secondary to the acute pancreatitis.  He is now afebrile.  His cultures  are so far negative.  His hypocalcemia and hypophosphatemia are being  repleted and patient is going on oral supplements as well.  Patient  today indicates that he does not want to stay in the hospital any  longer, especially since his abdominal pain has improved and his wife is  alone at home and he would like to go.  I have discussed his care with  Dr. Juanda Chance of gastroenterology who has indicated that it is okay for  patient to be discharged on a low-fat diet and to be followed up as an  outpatient with her and his primary medical doctor.  Patient will have  to have a complete metabolic panel to follow up his calcium and  phosphate.  Patient on admission had a hypoglycemic episode following  which his oral glycemic agents were held.  His blood sugars are ranging  in the 200's in the hospital.  Will resume Amaryl at reduced dose.  Also, his lipid-lowering agents were held secondary to his acute  pancreatitis; please consider  restarting them also  as an outpatient.  His Lasix was held secondary to  dehydration; again, this may be started in a couple of days once the  oral intake is adequate.  The etiology of his pancreatitis might have  been gallstone pancreatitis, however there is no evidence of  cholecystitis or gallstone obstruction on CT or ultrasound.      Marcellus Scott, MD  Electronically Signed     AH/MEDQ  D:  02/06/2007  T:  02/06/2007  Job:  [id number]   cc:   Evelena Peat, M.D.  Hedwig Morton. Juanda Chance, MD

## 2010-09-16 NOTE — Progress Notes (Signed)
Quick Note:  Pt informed ______ 

## 2010-09-18 NOTE — Assessment & Plan Note (Signed)
[facility]   NAME:  Frank Ashley, Frank Ashley               ACCOUNT NO.:  [id number]   MEDICAL RECORD NO.:  [id number]      DATE OF BIRTH:  01-24-1941   PHYSICIAN:  Theresia Majors. Tanda Rockers, M.D.      VISIT DATE:                                   OFFICE VISIT   SUBJECTIVE:  Frank Ashley is a 70 year old man who is referred by Dr. Evelena Peat for evaluation of nonhealing ulcerations on his feet.   IMPRESSION:  Bilateral Wagner grade 2 diabetic foot ulcers.   RECOMMENDATIONS:  The callus was completely excised in the Wound Center,  and the patient was placed in bilateral off-loading healing sandals.  We  have given him prescriptions for custom orthotics and extra-depth shoes.  In addition, we are adding Iodosorb topical gel to be applied once daily  with continuation of routine diabetic foot care.   SUBJECTIVE:  Frank Ashley is a 70 year old man who was last seen in the  Wound Center in 2005.  He had been followed at that time for ulcers that  were similar in position as he is presenting with today.  The patient  gives a history of several years of having ulcers on both feet related  to diabetic neuropathy and the insensate status of his feet.  He has  been followed by Dr. Caryl Never over several years, but over the last 2  years, he was relocated and has recently reestablished his patient  relationship with Dr. Caryl Never.  He was recently seen, and the wounds  were noted to be persistent, and he was referred to the Wound Center.  There has been no major change in his medications.  He remains an  insulin-dependent diabetic with his most recent hemoglobin A1c being 7.  His weight has been relatively stable.  He has no cardiorespiratory  symptoms.  No visual changes.  He does admit to progressive clumsiness  because of the inability to feel and the loss of footing.   OBJECTIVE:  His blood pressure is 120/68, respirations are 18, pulse of  80, temperature 98.2.  Capillary blood  glucose is 117 mg/%.  Inspection  of the lower extremities discloses that there is no significant  peripheral edema or stasis changes.  Wound #1 on the left plantar  surface of the foot is a shadow ulcer extending into the subcutaneous  area.  There is no hyperemia.  There is no drainage, no malodor, and no  evidence of concurrent wound infection.  There is an area of subdermal  hemorrhage on the medial aspect of the left hallux, but there is no open  drainage.  Wound #2 on the right hallux on the plantar surface has a  firm halo of callus with a deep area of chronic reactive tissue.  The  callus was thoroughly debrided with the result in hemorrhage control  with silver nitrate.  There was no evidence of infection.  There is no  ascending cellulitis or lymphangitis.  Neurologically, the patient is  insensate.  The dorsalis pedis pulses are +3 bilaterally.   DISCUSSION:  The interview with Frank Ashley discloses that he has not been  adequately off-loaded.  In spite of his use of custom orthotics, he  reports that he is unable  to heal these wounds.  He is currently not  wearing orthotics.  He reports that his inserts are over 1-1/69 years  old.  He continues to be ambulatory, however.  We have explained our  approach will be initially to ensure that he has been adequately off-  loaded.  To achieve this, we have given him the healing sandal with the  transverse felt strips to off-load the first and the fifth metatarsal  heads.  There is no evidence of vascular insufficiency.  His neuropathy  is considerably complicating his wound care.  We will caution him to  exercise great caution to avoid injury to his feet.  We have given the  patient an opportunity to ask questions.  He seems to understand this  approach and indicates that he will be compliant.  He has received  prescriptions for the  custom orthotics and extra-depth shoes along with Iodosorb.  We will  reevaluate him on a weekly basis to  assess his response to therapy and  make the adjustments to his wound management as indicated up to and  including hyperbaric oxygen treatment.           ______________________________  Theresia Majors. Tanda Rockers, M.D.     Cephus Slater  D:  07/07/2006  T:  07/08/2006  Job:  [id number]   cc:   Evelena Peat, M.D.

## 2010-09-18 NOTE — Consult Note (Signed)
[facility]  Patient:    Frank Ashley, Frank Ashley Visit Number: [id number] MRN: [id number]          Service Type: FTC Location: FOOT Attending Physician:  Nadara Mustard Dictated by:   Nadara Mustard, M.D. Proc. Date: 02/21/01 Admit Date:  02/21/2001                            Consultation Report  HISTORY OF PRESENT ILLNESS:  The patient is a 70 year old gentleman who has been followed at [facility] for quite some time.  He most recently had seen Dr. Lestine Box.  For the past several months he previously had surgery performed by Dr. Lequita Halt several years ago, and has not had any healing since that time.  The patient states he has been on Keflex 500 mg q.i.d. for the past 8 weeks.  PAST MEDICAL HISTORY: 1. Type 2 diabetes.  His most recent CBG was 232, most recent hemoglobin A1C    was 8.3. 2. Obesity. 3. Atherosclerotic cardiovascular disease. 4. Gastroesophageal reflux. 5. Hyperlipidemia. 6. Peripheral neuropathy.  PAST SURGICAL HISTORY: 1. Right great toe surgery. 2. Stomach ulceration with surgical treatment x 3. 3. Cardiac stents.  Height 6 feet 1 inch, weight 320 pounds.  Tobacco use none.  PRIMARY ORTHOPEDIC SURGEON:  Dr. Lestine Box.  ALLERGIES:  SULFA causes hives.  MEDICATIONS: 1. Lipitor. 2. Actos. 3. Amaryl. 4. Neurontin. 5. Glucophage. 6. Keflex. 7. Celebrex.  PHYSICAL EXAMINATION:  The patient does have thickening of the nails with onychomycosis.  He does have bunions and calluses with pas plantus.  He does have a palpable dorsalis pedis pulses.  He has redness around the great toes with pitting edema of both lower extremities.  He has open wounds underneath the right great toe and the left first metatarsal head.  Right great toe ulcer measures 13 x 10 x 5 deep in millimeters.  Left first metatarsal head also measures 9 x 5 x 5 deep in millimeters.  The patient does not have protective sensation, cannot feel a 5.0 7 Sims  Weinstein monofilament.  ASSESSMENT:  Chronic Wagner grade III ulceration, right great toe and left first metatarsal head.  PLAN:  We will start him on Dial soap soaks for 20 minutes once a day, ______ dressing, and a Darco shoe.  We will obtain a bone scan of both feet and plan to followup in the clinic in one week. Dictated by:   Nadara Mustard, M.D. Attending Physician:  Nadara Mustard DD:  02/28/01 TD:  03/01/01 Job: [id number]

## 2010-09-18 NOTE — Assessment & Plan Note (Signed)
[facility]   NAME:  Frank Ashley, Frank Ashley               ACCOUNT NO.:  [id number]   MEDICAL RECORD NO.:  [id number]      DATE OF BIRTH:  1941-02-05   PHYSICIAN:  Theresia Majors. Tanda Rockers, M.D.      VISIT DATE:                                   OFFICE VISIT   VITAL SIGNS:  Blood pressure is 115/53, respirations are 20, pulse rate  is 94, temperature 97.1  Capillary blood glucose is 96 mg percent.   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  In the interim, we have treated him with offloading healing  sandals utilizing transverse Felt strips.  He reports that the sandals  have become disruptive due to his heavy walking and his weight.  He  denies interim pain, drainage, malodor, fever.   WOUND EXAM:  Inspection of the lower extremity shows that there is trace  edema.  Wound #1 on the left plantar surface involving the hallux shows  an intense halo of callous which was pared.  There was also nonviable  tissue sharply debrided with a 10 blade with hemorrhage  controlled with  direct pressure.  Similarly wound #2 on the right hallux was pared of a  thick callous and thereafter a full-thickness debridement was performed  of the wound itself.  All viable tissue was removed and hemorrhage was  controlled with direct pressure.  There was no pain.  There is no  evidence of infection.  The dorsalis pedis pulse is readily palpable.  The patient remains insensate.   DIAGNOSIS:  [place] grade 2 diabetic foot ulcer.   MANAGEMENT PLAN & GOAL:  We are placing the patient in a total contact  cast for his right foot.  We will continue him on the healing sandal  with offloading for the left.  We will reevaluate him in one week.  He  will continue his efforts for procurement of the extra-depth shoes and  custom orthotics.      Harold A. Tanda Rockers, M.D.  Electronically Signed     HAN/MEDQ  D:  07/28/2006  T:  07/28/2006  Job:  [id number]

## 2010-09-18 NOTE — Consult Note (Signed)
NAME:  Frank Ashley, Frank Ashley               ACCOUNT NO.:  [id number]   MEDICAL RECORD NO.:  [id number]          PATIENT TYPE:  REC   LOCATION:  FOOT                         FACILITY[facility]  [facility]   PHYSICIAN:  Jonelle Sports. Sevier, M.D. DATE OF BIRTH:  09-06-1940   DATE OF CONSULTATION:  07/16/2004  DATE OF DISCHARGE:                                   CONSULTATION   HISTORY:  This a 70 year old white male previously known to this clinic is  referred by himself for evaluation and management of a plantar ulcer of the  right foot.   The patient has had longstanding diabetes which apparently is in adequate  control with a hemoglobin A1c of 6.5% in November 2005. He was known to this  clinic with a plantar ulcer of the right hallux several years ago which we  got healed through total contact casting. He subsequent left area and moved  to [place] where the lesion apparently quickly recurred and has been  open some four years since that time. He is had the repeated evaluations to  exclude the possibility of osteomyelitis, the most recent which was  approximately six months ago in [place], which was said to be  negative. The wound has been treated with periodic trimming as he calls it  and with application of various a topical agents to include most recently  Regranex. He reports also that he has been given a series of Augmentin every  four to six weeks for recurrent to superficial infection.   Despite all these measures, the lesion is unhealed and he reports now for  our evaluation and advice.   PAST MEDICAL HISTORY:  The patient also has arteriosclerotic cardiovascular  disease, hyperlipidemia and gastroesophageal reflux and sleep apnea. He has  been stented for his coronary disease and has also had bilateral cataract  surgery.   ALLERGIES:  He said to be allergic to SULFA and DILAUDID.   REGULAR MEDICATIONS:  1.  Actos.  2.  Amaryl.  3.  Metformin.  4.  Tramadol.  5.  Aricept.  6.   Tricor.  7.  Lipitor.  8.  Neurontin.  9.  Nexium.  10. Lisinopril.   EXAMINATION:  Examination today is limited to the distal lower extremities.  The left lower extremity is characterized by an absence of edema, easily  palpable pulses, good hair growth, but an absence of protective sensation.  There is callus formation at the interphalangeal joint area of the left  hallux and also at the first MP joint area plantar aspect.   The right foot is characterized by slight edema, palpable and adequate  pulses, again an absence of protective sensation, good motion of the heel  cord, good motion of the toe. On the plantar aspect of the hallux, the  distal phalangeal area is an open ulcer measuring 8 x 9 x 5 mm in depth with  no surrounding erythema or evidence of substantial infection.   DISPOSITION:  1.  The patient is given refresher information on the foot care management      and diabetes by video with nurse  and physician reinforcement.  2.  The calluses at the aforementioned locations on the left foot are pared      without incident.  3.  The ulcer on the plantar aspect of the right hallux is debrided with      striking reduction of the margins in the depth of the ulcer and by      enlarging the ulcer slightly in every dimension with the exception of      depth. At the conclusion of debridement, the ulcer measures 10 x 11 x 2      mm. Efforts to probe of the depths of the ulcer show no apparent      penetration to fascia, joint or bone.  4.  The wound is then dressed with an application of Promogran and wound gel      covered with an Allevyn pad.  That extremity is then placed in a total      contact cast.  5.  Follow-up visit will be to this clinic in six days.  6.  Incidentally, it will not be my plan to reassess for the possibility      osteomyelitis unless something about the clinical course suggests      failure to respond or suggests underlying infection.      RES/MEDQ  D:   07/16/2004  T:  07/16/2004  Job:  [id number]   cc:   Evelena Peat, M.D.  [address]  [place]  [place] [address]  Fax: [phone]

## 2010-09-18 NOTE — Assessment & Plan Note (Signed)
[facility]   NAME:  Frank Ashley, Frank Ashley               ACCOUNT NO.:  [id number]   MEDICAL RECORD NO.:  [id number]      DATE OF BIRTH:  1940-08-21   PHYSICIAN:  Theresia Majors. Tanda Rockers, M.D.      VISIT DATE:                                   OFFICE VISIT   VITAL SIGNS:  Blood pressure 143/78, respirations 18, pulse rate 81,  temperature 97.6, capillary blood glucose is 105 mg percent.   PURPOSE OF TODAY'S VISIT:  [name] is a 70 year old man who returns  for followup of bilateral diabetic foot ulcers, Wegner grade 2.  In the  interim, he has been wearing an offloading healing sandal while awaiting  custom orthotics.  He is using Iodosorb and antiseptic soaks daily.  He  denies fever, excessive drainage, malodor or pain.   WOUND EXAM:  Inspection of the left plantar surface shows that the wound  measurements are essentially unchanged, but there is a well-perfuse  healthy-appearing granulating base.  Similarly, the right toe appears  the same with no significant change in volume.  There is no buildup of  callous either.  The healing sandal appears to be adequately offloading.   DIAGNOSIS:  Adequate offloaded Wegner grade 2 diabetic foot ulcers   MANAGEMENT PLAN & GOAL:  The patient is scheduled to have his orthotics  customized on Tuesday, March 18.  We will reevaluate him in two weeks.  If the patient notices redness, pain or becomes concern in any way, he  will call for an interim appointment.      Harold A. Tanda Rockers, M.D.  Electronically Signed     HAN/MEDQ  D:  07/14/2006  T:  07/15/2006  Job:  [id number]

## 2010-09-18 NOTE — Assessment & Plan Note (Signed)
[facility]   NAME:  Frank Ashley, Frank Ashley               ACCOUNT NO.:  [id number]   MEDICAL RECORD NO.:  [id number]      DATE OF BIRTH:  May 10, 1940   PHYSICIAN:  Theresia Majors. Tanda Rockers, M.D. VISIT DATE:  08/01/2006                                   OFFICE VISIT   VITAL SIGNS:  Blood pressure is 134/70, respirations 20, pulse rate 83.  Temperature is 98.4.  Capillary blood glucose is 84 mg percent.   PURPOSE OF TODAY'S VISIT:  [name] is a 70 year old man who we are  treating for a Wagner grade 2 diabetic foot ulcer with a total contact  cast.  Over the weekend, he attended a sports event and got the cast  wet.  He called the clinic and related the same.  We instructed him to  come to the clinic to have the cast exchanged.  In the interim, he  denies pain, fever, or malodor.  The cast has been removed.   WOUND EXAM:  Inspection of the left plantar and the right great toe  shows that the wounds have, in fact, improved.  Nevertheless, we are  returning him to the total contact cast.  We are awaiting his custom  orthotics, which should be available August 22, 2006.   DIAGNOSIS:  Clinical response to off-loading of a Wagner grade 2 wound,  improved.   MANAGEMENT PLAN & GOAL:  We will reapply the total contact cast and  reevaluate the patient in two weeks.      Harold A. Tanda Rockers, M.D.  Electronically Signed     HAN/MEDQ  D:  08/01/2006  T:  08/01/2006  Job:  [id number]

## 2010-09-18 NOTE — Cardiovascular Report (Signed)
[place]  Patient:    Frank Ashley, Frank Ashley                      MRN: [id number] Proc. Date: 05/04/98 Adm. Date:  [id number] Disc. Date: [id number] Attending:  Learta Codding CC:         Nolon Nations, M.D., M.P.H.             Arturo Morton. Riley Kill, M.D. [facility]                        Cardiac Catheterization  PROCEDURES PERFORMED:  Coronary arteriogram.  INDICATIONS:  Recurrent chest pain with previous stent to the circumflex coronary artery.  RESULTS: 1. The left main coronary artery had a 20% discrete lesion.  2. The left anterior descending artery had 50% to 60% tubular lesion in the    mid vessel.  3. The circumflex coronary artery was dominant.  There was a 20% proximal    lesion.  There was a 50% in-stent restenosis similar to that seen in the    catheterization in 1999.  The distal vessel was normal size and disease.  4. The right coronary artery was small and nondominant.  RAO VENTRICULOGRAPHY:  RAO ventriculography was normal.  There was no gradient across the aortic valve and no MR.  Ejection fraction was in excess of 60%.  HEMODYNAMIC DATA:  Aortic pressure was 115/65.  LV pressure was 115/18.  IMPRESSION:  The patients coronary arteries are stable.  The previous stent appears stable with mild to moderate in-stent restenosis.  The patients primary symptoms involve right leg pain and swelling.  PLAN:  He will be discharged home on medical therapy.  He will follow up with Dr. Riley Kill for an outpatient Cardiolite study.  He will be started on Microzide 12.5 mg q.d.  He will follow up with Dr. Corine Shelter for his lower extremity edema.  The right femoral artery was closed using the Perclose device with good hemostasis.  Because of the patients size, he was treated with Ancef in the laboratory and will have a five day course of Keflex 500 mg b.i.d. DD:  09/01/99 TD:  09/02/99 Job: [id number]

## 2010-09-18 NOTE — Assessment & Plan Note (Signed)
[facility]   NAME:  DESMIN, DALEO               ACCOUNT NO.:  [id number]   MEDICAL RECORD NO.:  [id number]      DATE OF BIRTH:  11-Aug-1940   PHYSICIAN:  Theresia Majors. Tanda Rockers, M.D. VISIT DATE:  08/09/2006                                   OFFICE VISIT   SUBJECTIVE:  Mr. Guevarra returns to the clinic complaining of pain  underneath his total contact cast of the right foot.  The patient was  last seen August 01, 2006 and was placed in the total contact cast with  Autosorb and a soft sole.  He reports that he has had progressive pain  along the posterior lateral aspect and the heel.  There has been no  fever, no malodor, and no obvious drainage.  He continues to be  ambulatory.   OBJECTIVE:  The cast was removed in the wound center.  His blood  pressure is 130/68, respirations 16, pulse rate of 82.  Inspection of  the previous wounds #1 on the left plantar surface, the wound is  associated with a callus as a result of the Autosorb application.  There  is no hyperemia.  There is no evidence of infection.  There is no  vascular compromise.  Wound #2, underneath the layers of cast material  was directly inspected.  There is absolutely no evidence of cast injury.  The skin is pristine.  There are no breaks.  There are no pressure  points of hyperemia or hypoperfusion.  The pedal pulse is readily  palpable.  The previous wound #2 is sealed over with a thin film of  Autosorb.  There is absolutely no drainage.   IMPRESSION:  Clinically improved Wagner grade 2 diabetic foot ulcers.   PLAN:  We will not replace the patient in the total contact cast.  Rather, we will use bilateral healing sandals with offloading achieved  with transverse strips of felt.  These healing sandals were modified in  the wound center.  The patient informs me that he will get his custom  orthotics in another 14 days.  We have given him an appointment for  follow up in 2 weeks p.r.n. at which time he  should have his custom  orthotics.  We have given the patient the opportunity to ask questions.  He seems to understand this approach.  He understands that he is to stay  off his feet as much as possible as to avoid recurrent injury.  If he  should have further injury to the feet, he is to call the clinic for a  sooner appointment.  He seems to understand and indicates that he will  be compliant.      Harold A. Tanda Rockers, M.D.  Electronically Signed     HAN/MEDQ  D:  08/09/2006  T:  08/10/2006  Job:  [id number]

## 2010-09-18 NOTE — Discharge Summary (Signed)
[place]  Patient:    Frank Ashley, Frank Ashley                      MRN: [id number] Adm. Date:  [id number] Disc. Date: 08/27/99 Attending:  Learta Codding Dictator:   Leonides Cave, P.A. CC:         Arturo Morton. Riley Kill, M.D. [facility]             Nolon Nations, M.D., M.P.H.             Lewayne Bunting, M.D. [facility]                           Discharge Summary  DATE OF BIRTH:  10/31/1940  DISCHARGE DIAGNOSES: 1. Shortness of breath, with no objective evidence for edema. 2. Left leg pain, with no deep vein thrombosis, questionable neuropathy.  To    have followup with Dr. Nolon Nations, and consider a neurology workup.  HISTORY OF PRESENT ILLNESS:  The patient is a 70 year old male with a history of coronary artery disease.  He had a stent to the circumflex coronary artery in 1988, in [place], [place].  Apparently also was catheterized in December 2000, and ad a 20% left main, a 50%-60% LAD, a 50% in-stent restenosis of the circumflex artery, which was similar to a catheterization in 1999.  The RCA was normal.  The patient also had normal LV function with an ejection fraction of 60%.  The patient presented to the emergency room with a four-month history of worsening edema, dyspnea on exertion, shortness of breath, and orthopnea. The patient had a 25 pound weight gain over the past month.  The patient was on steroids for upper respiratory infection in March 2000.  Furthermore, for the past four days prior to admission he is complaining of chest pressure, almost constant, which worsened on the evening before admission.  The pain was starting to ease with three sublingual nitroglycerin in the emergency room, and O2.  HOSPITAL COURSE:  The patient was admitted to rule out for a myocardial infarction, and was started on the IV Lasix for his worsening edema.  The patient diuresed ery well with IV Lasix.  The patient ruled out for a myocardial infarction.   The patient was set up for a chest CT to rule out pulmonary embolism, and it was negative.  There is no adenopathy or mass as well, and the lungs were clear. No infiltrates or effusions.  The patient also had a CT of the lower extremities which was negative for lower extremity deep vein thromboses.  Finally the patient had a 2-D echocardiogram on August 27, 1999, which was extremely difficult and limited. Dr. Luis Abed read the report.  The echocardiogram report states that Dr. Myrtis Ser gets the feel that the LV and the RV probably both move adequately. Dr. Myrtis Ser could not assess for the question of diastolic dysfunction.  The small  amount of mitral inflow signal obtained did not seem to be abnormal.  The echocardiogram was of such poor quality, the assessment of LV function could not be visualized.  DISPOSITION:  With the test done revealing no true cause of shortness of breath, and leg pain, and certainly ischemia, the patient was discharged home in an improved condition on August 27, 1999.  DISCHARGE MEDICATIONS:  He will continue his same medications that he came in on. 1. Amaryl 4  mg b.i.d. 2. Actos 45 mg q.d. 3. Prilosec 20 mg b.i.d. 4. Coated aspirin q.d. 5. Neurontin 300 mg t.i.d. 6. HCTZ, unknown dose. 7. Diovan, unknown dose. 8. Lipitor 40 mg q.d. 9. In addition, he will start a more potent diuretic, being Lasix 40 mg q.d.  INSTRUCTIONS:  He was instructed to limit strenuous activity until he had an outpatient Cardiolite which was set up for Monday, Sep 07, 1999, at 10 a.m. at the [facility].  Three days later he will see Dr. Arturo Morton. Stuckey/P.A. on Sep 10, 1999, at 10 a.m.  He should follow up with Dr. Corine Shelter in one week or two following discharge, to assess for his lower extremity pain, and to see how he is doing post-hospital.  At that time he should probably also get a basic metabolic panel to check his potassium and renal function.  He  was also given the number or the [facility].  He was told to consider a strict diabetic diet.  LABORATORY DATA:  White count 5.6, hemoglobin 14.1, hematocrit 39.7, platelet count 233.  Sodium 139, potassium 3.9, chloride 106, BUN 20, creatinine 1.2, glucose 47. D-dimer was less than 0.22.DD:  08/27/99 TD:  08/27/99 Job: [id number]

## 2010-09-29 ENCOUNTER — Encounter (HOSPITAL_BASED_OUTPATIENT_CLINIC_OR_DEPARTMENT_OTHER): Payer: Medicare Other

## 2010-10-27 ENCOUNTER — Encounter (HOSPITAL_BASED_OUTPATIENT_CLINIC_OR_DEPARTMENT_OTHER): Payer: Medicare Other

## 2010-12-15 ENCOUNTER — Ambulatory Visit: Payer: Medicare Other | Admitting: Family Medicine

## 2010-12-23 ENCOUNTER — Ambulatory Visit (INDEPENDENT_AMBULATORY_CARE_PROVIDER_SITE_OTHER): Payer: Medicare Other | Admitting: Family Medicine

## 2010-12-23 ENCOUNTER — Encounter: Payer: Self-pay | Admitting: Family Medicine

## 2010-12-23 DIAGNOSIS — E1169 Type 2 diabetes mellitus with other specified complication: Secondary | ICD-10-CM

## 2010-12-23 DIAGNOSIS — N189 Chronic kidney disease, unspecified: Secondary | ICD-10-CM

## 2010-12-23 DIAGNOSIS — E785 Hyperlipidemia, unspecified: Secondary | ICD-10-CM

## 2010-12-23 DIAGNOSIS — E119 Type 2 diabetes mellitus without complications: Secondary | ICD-10-CM

## 2010-12-23 LAB — LIPID PANEL
HDL: 28.4 mg/dL — ABNORMAL LOW (ref >39.00)
Total CHOL/HDL Ratio: 4
Triglycerides: 281 mg/dL — ABNORMAL HIGH (ref 0.0–149.0)

## 2010-12-23 LAB — HEPATIC FUNCTION PANEL
ALT: 14 U/L (ref 0–53)
AST: 17 U/L (ref 0–37)
Albumin: 3.6 g/dL (ref 3.5–5.2)
Alkaline Phosphatase: 37 U/L — ABNORMAL LOW (ref 39–117)
Total Protein: 6.6 g/dL (ref 6.0–8.3)

## 2010-12-23 LAB — BASIC METABOLIC PANEL
Calcium: 9.2 mg/dL (ref 8.4–10.5)
GFR: 33.23 mL/min — ABNORMAL LOW (ref >60.00)
Glucose, Bld: 92 mg/dL (ref 70–99)
Potassium: 4.7 mEq/L (ref 3.5–5.1)
Sodium: 143 mEq/L (ref 135–145)

## 2010-12-23 LAB — HEMOGLOBIN A1C: Hgb A1c MFr Bld: 8.1 % — ABNORMAL HIGH (ref 4.6–6.5)

## 2010-12-23 NOTE — Progress Notes (Signed)
  Subjective:    Patient ID: Frank Ashley, male    DOB: 04/28/1941, 70 y.o.   MRN: [id number]  HPI Medical followup. Patient has obesity, type 2 diabetes, peripheral neuropathy, hyperlipidemia, GERD, history of a recurrent pancreatitis and chronic renal insufficiency along with obstructive sleep apnea. Diabetes reviewed. Last A1c 9.1%. Fasting blood sugars 90-150. Evening blood sugars around 200 prior to dinner. Currently takes novel and 70/30 insulin 75 units twice daily. No hypoglycemia. No recent eye exam.  Right plantar toe ulcer. Wound care treatment center and they have basically released patient. This has not fully healed. No drainage or signs of infection. Needs new diabetic shoes.  Hyperlipidemia treated with Zocor 40 mg daily. No myalgias.  Pruritic rash axillary region. No change of antiperspirants. No alleviating factors. Exacerbated by heat  Past Medical History  Diagnosis Date  . Allergy   . Diabetes mellitus     Type 2  . GERD (gastroesophageal reflux disease)   . Hyperlipidemia   . Peripheral neuropathy     peripheral neuropathy  . Renal insufficiency   . Diabetic foot ulcers   . Overweight   . Sleep apnea     CPAP  . Pancreatitis 02/2007    ACUTE  . MRSA (methicillin resistant Staphylococcus aureus)    Past Surgical History  Procedure Date  . Coronary angioplasty with stent placement   . Penile prosthesis implant     reports that he has never smoked. He has never used smokeless tobacco. He reports that he does not drink alcohol. His drug history not on file. family history is not on file. Allergies  Allergen Reactions  . Hydrocodone   . Sulfamethoxazole     REACTION: hives      Review of Systems  Constitutional: Negative for fever, chills and appetite change.  HENT: Negative for trouble swallowing.   Respiratory: Negative for cough and shortness of breath.   Cardiovascular: Negative for chest pain, palpitations and leg swelling.  Gastrointestinal:  Negative for nausea, vomiting, abdominal pain and constipation.  Genitourinary: Negative for dysuria.  Skin: Positive for rash.       Objective:   Physical Exam  Constitutional: He is oriented to person, place, and time. He appears well-developed and well-nourished.  Neck: Neck supple.  Cardiovascular: Normal rate and regular rhythm.   Pulmonary/Chest: Effort normal and breath sounds normal. No respiratory distress. He has no wheezes. He has no rales.  Musculoskeletal: He exhibits no edema.       Impaired sensation to monofilament bilaterally. Right great toe plantar surface 3-4 mm ulcer which is superficial. No drainage. No erythema  Lymphadenopathy:    He has no cervical adenopathy.  Neurological: He is alert and oriented to person, place, and time.  Skin:       Axillary region mild erythema with a few excoriations. No vesicles or pustules  Psychiatric: He has a normal mood and affect. His behavior is normal.          Assessment & Plan:  #1 type 2 diabetes. History of poor control. Increase novolin and 70/30 insulin 80 units morning and 75 units p.m. needs to establish repeat eye exam. Prescription for new diabetic shoes #2 dyslipidemia. Recheck lipids and hepatic panel  #3 chronic renal insufficiency. Check basic metabolic panel #4 nonspecific rash. Try over-the-counter hydrocortisone cream

## 2010-12-23 NOTE — Patient Instructions (Signed)
Set up eye exam Followup promptly for any drainage or redness involving toes Increase novolin70/30 insulin to 80 units in the morning and continue 75 units at night

## 2010-12-24 ENCOUNTER — Telehealth: Payer: Self-pay | Admitting: Family Medicine

## 2010-12-24 NOTE — Telephone Encounter (Signed)
Pt called req lab results. Pls call today.

## 2010-12-24 NOTE — Progress Notes (Signed)
Quick Note:  Pt informed on VM, copy mailed to pt home ______

## 2010-12-24 NOTE — Telephone Encounter (Signed)
Pt informed on VM, labs mailed to his home

## 2010-12-25 ENCOUNTER — Telehealth: Payer: Self-pay | Admitting: Family Medicine

## 2010-12-25 DIAGNOSIS — Z1211 Encounter for screening for malignant neoplasm of colon: Secondary | ICD-10-CM

## 2010-12-25 MED ORDER — AMOXICILLIN-POT CLAVULANATE 875-125 MG PO TABS: 1.0000 | ORAL_TABLET | Freq: Two times a day (BID) | ORAL | Status: DC

## 2010-12-25 NOTE — Telephone Encounter (Signed)
Pt informed Rx called in, lab results given earlier today

## 2010-12-25 NOTE — Telephone Encounter (Signed)
May refill Augmentin 875 mg one twice daily for 10 days but needs to be seen if redness, foul drainage noted. Call regarding lab results.

## 2010-12-25 NOTE — Telephone Encounter (Signed)
Pt called & LM on triage line. Said you called and he missed it. Please call again.

## 2010-12-25 NOTE — Telephone Encounter (Signed)
Left message on triage line. Pt was here 2 days ago for A1C. He would like the results. Also, he was here with a diabetic sore on his foot. There is now also a blister. He states he usually has Augmentin on hand from Dr. B for this stuff, but is out. Can it be called in? Please call with results and to advise.

## 2010-12-25 NOTE — Telephone Encounter (Signed)
Pt requesting referall for colonscopy.  He has lived here since 1995 and states he has never had one.  Our records show he is due in 2017.

## 2010-12-27 NOTE — Telephone Encounter (Signed)
My recollection is that he stated he had one in [place] within the past 10 years.  Of course, I cannot confirm with our current electronic records.  I will be happy to refer him for another but see if he can confirm whether he had one in [place].

## 2010-12-30 NOTE — Telephone Encounter (Signed)
Message left on pt cell to call back and confirm he did have one in [place]

## 2010-12-31 NOTE — Telephone Encounter (Signed)
Will go ahead and referral.. I will order.  Let pt know.

## 2010-12-31 NOTE — Telephone Encounter (Signed)
Pt not positive he had one in [place].  Sister, brother and Mother all have been diagnosed with CA colon and liver, brother just passed.

## 2011-01-01 NOTE — Telephone Encounter (Signed)
Pt informed

## 2011-01-11 ENCOUNTER — Telehealth: Payer: Self-pay | Admitting: Family Medicine

## 2011-01-11 ENCOUNTER — Other Ambulatory Visit: Payer: Self-pay | Admitting: Family Medicine

## 2011-01-11 MED ORDER — INSULIN NPH ISOPHANE & REGULAR (70-30) 100 UNIT/ML ~~LOC~~ SUSP: SUBCUTANEOUS | Status: DC

## 2011-01-11 NOTE — Telephone Encounter (Signed)
Med refilled with new sig

## 2011-01-11 NOTE — Telephone Encounter (Signed)
Pt called and said that [facility] told pt that they do not carry the insulin NPH-insulin regular (NOVOLIN 70/30) (70-30) 100 UNIT/ML. Pt says that he normally gets Humilin. Pls call in to [facility] on [address] [phone] Humilin 80 units in am and 75 units pm for 90 day supply.   Pt said that pharmacy sent a fax about his around 2pm today.

## 2011-01-11 NOTE — Telephone Encounter (Signed)
Pt stated insulin has been increase to 80 units in morning and 75 units in afternoon. Please call [facility] in [place] [phone]

## 2011-01-12 NOTE — Telephone Encounter (Signed)
I did call the pharmacy and spoke with pharmacist.  Basically insulins are the same, pt has already been given Humulin and is all taken care of

## 2011-02-04 ENCOUNTER — Encounter: Payer: Self-pay | Admitting: Gastroenterology

## 2011-02-08 ENCOUNTER — Telehealth: Payer: Self-pay | Admitting: Family Medicine

## 2011-02-08 DIAGNOSIS — E785 Hyperlipidemia, unspecified: Secondary | ICD-10-CM

## 2011-02-08 MED ORDER — INSULIN NPH ISOPHANE & REGULAR (70-30) 100 UNIT/ML ~~LOC~~ SUSP: SUBCUTANEOUS | Status: DC

## 2011-02-08 NOTE — Telephone Encounter (Signed)
PT requesting refill on HUMULIN 70/30 pt requesting a 90 day supply.  [facility] [place]

## 2011-02-11 ENCOUNTER — Other Ambulatory Visit: Payer: Self-pay | Admitting: Family Medicine

## 2011-02-11 LAB — PHOSPHORUS
Phosphorus: 1.3 — ABNORMAL LOW
Phosphorus: 1.6 — ABNORMAL LOW
Phosphorus: 4

## 2011-02-11 LAB — COMPREHENSIVE METABOLIC PANEL
ALT: 25
AST: 27
Albumin: 2.8 — ABNORMAL LOW
Albumin: 3.7
BUN: 25 — ABNORMAL HIGH
CO2: 25
Calcium: 8.7
Chloride: 104
Creatinine, Ser: 1.96 — ABNORMAL HIGH
GFR calc Af Amer: 36 — ABNORMAL LOW
GFR calc non Af Amer: 30 — ABNORMAL LOW
Potassium: 3.1 — ABNORMAL LOW
Potassium: 4
Sodium: 137
Total Bilirubin: 1.1
Total Protein: 6.7

## 2011-02-11 LAB — BASIC METABOLIC PANEL
BUN: 13
BUN: 18
CO2: 20
CO2: 22
Calcium: 7.3 — ABNORMAL LOW
Calcium: 7.9 — ABNORMAL LOW
Chloride: 106
Creatinine, Ser: 1.42
Creatinine, Ser: 1.54 — ABNORMAL HIGH
Creatinine, Ser: 1.75 — ABNORMAL HIGH
GFR calc Af Amer: >60
GFR calc non Af Amer: 46 — ABNORMAL LOW
GFR calc non Af Amer: 50 — ABNORMAL LOW
Glucose, Bld: 141 — ABNORMAL HIGH
Glucose, Bld: 214 — ABNORMAL HIGH
Potassium: 4.1
Sodium: 132 — ABNORMAL LOW

## 2011-02-11 LAB — COMPREHENSIVE METABOLIC PANEL WITH GFR
ALT: 18
AST: 25
Alkaline Phosphatase: 31 — ABNORMAL LOW
CO2: 31
Chloride: 98
GFR calc Af Amer: 42 — ABNORMAL LOW
GFR calc non Af Amer: 34 — ABNORMAL LOW
Glucose, Bld: 48 — ABNORMAL LOW
Sodium: 139
Total Bilirubin: 1

## 2011-02-11 LAB — CBC
HCT: 28.9 — ABNORMAL LOW
HCT: 38.9 — ABNORMAL LOW
Hemoglobin: 13.2
MCHC: 33.9
MCV: 86.1
MCV: 88.3
Platelets: 177
Platelets: 181
Platelets: 206
Platelets: 264
RBC: 3.86 — ABNORMAL LOW
RBC: 4.52
RDW: 14.6 — ABNORMAL HIGH
RDW: 15.1 — ABNORMAL HIGH
WBC: 11.6 — ABNORMAL HIGH
WBC: 12.9 — ABNORMAL HIGH
WBC: 13 — ABNORMAL HIGH
WBC: 8.7

## 2011-02-11 LAB — URINALYSIS, ROUTINE W REFLEX MICROSCOPIC
Bilirubin Urine: NEGATIVE
Glucose, UA: NEGATIVE
Glucose, UA: NEGATIVE
Hgb urine dipstick: NEGATIVE
Leukocytes, UA: NEGATIVE
Nitrite: NEGATIVE
Protein, ur: 100 — AB
Protein, ur: 100 — AB
Urobilinogen, UA: 1
Urobilinogen, UA: 2 — ABNORMAL HIGH

## 2011-02-11 LAB — LIPASE, BLOOD: Lipase: 244 — ABNORMAL HIGH

## 2011-02-11 LAB — BASIC METABOLIC PANEL WITH GFR
BUN: 13
Chloride: 103
Glucose, Bld: 209 — ABNORMAL HIGH
Potassium: 4.1

## 2011-02-11 LAB — DIFFERENTIAL
Eosinophils Absolute: 0
Eosinophils Relative: 0
Lymphs Abs: 0.7

## 2011-02-11 LAB — HEMOGLOBIN A1C
Hgb A1c MFr Bld: 7.7 — ABNORMAL HIGH
Mean Plasma Glucose: 197

## 2011-02-11 LAB — URINE CULTURE: Colony Count: NO GROWTH

## 2011-02-11 LAB — URINE MICROSCOPIC-ADD ON

## 2011-02-11 LAB — CARDIAC PANEL(CRET KIN+CKTOT+MB+TROPI)
CK, MB: 1.1
CK, MB: 2
CK, MB: 2.2
Relative Index: 0.8
Total CK: 136
Total CK: 144
Troponin I: 0.02

## 2011-02-11 LAB — CULTURE, BLOOD (ROUTINE X 2)

## 2011-02-11 LAB — LIPID PANEL
Cholesterol: 115
HDL: 28 — ABNORMAL LOW
Triglycerides: 96

## 2011-02-11 LAB — AMYLASE: Amylase: 386 — ABNORMAL HIGH

## 2011-02-11 LAB — HEPATIC FUNCTION PANEL
Alkaline Phosphatase: 29 — ABNORMAL LOW
Indirect Bilirubin: 0.8
Total Bilirubin: 1
Total Protein: 6.7

## 2011-02-11 LAB — SAMPLE TO BLOOD BANK

## 2011-02-11 LAB — TSH: TSH: 0.807

## 2011-02-11 LAB — CALCIUM: Calcium: 8.2 — ABNORMAL LOW

## 2011-02-11 LAB — MAGNESIUM: Magnesium: 1.9

## 2011-02-11 LAB — GASTRIN: Gastrin: <10 pg/mL (ref 13–115)

## 2011-02-25 ENCOUNTER — Ambulatory Visit (AMBULATORY_SURGERY_CENTER): Payer: Medicare Other | Admitting: *Deleted

## 2011-02-25 ENCOUNTER — Encounter: Payer: Self-pay | Admitting: Gastroenterology

## 2011-02-25 VITALS — Ht 73.0 in | Wt 284.0 lb

## 2011-02-25 DIAGNOSIS — Z1211 Encounter for screening for malignant neoplasm of colon: Secondary | ICD-10-CM

## 2011-02-25 MED ORDER — PEG-KCL-NACL-NASULF-NA ASC-C 100 G PO SOLR: ORAL | Status: DC

## 2011-02-25 NOTE — Progress Notes (Signed)
Pt here for PV today.  He says his last colonoscopy was 11 to 12 years ago in [place].  He has history colon cancer in mother, sister, and brother. Frank Ashley

## 2011-03-11 ENCOUNTER — Encounter: Payer: Self-pay | Admitting: Gastroenterology

## 2011-03-11 ENCOUNTER — Ambulatory Visit (AMBULATORY_SURGERY_CENTER): Payer: Medicare Other | Admitting: Gastroenterology

## 2011-03-11 DIAGNOSIS — D1330 Benign neoplasm of unspecified part of small intestine: Secondary | ICD-10-CM

## 2011-03-11 DIAGNOSIS — Z1211 Encounter for screening for malignant neoplasm of colon: Secondary | ICD-10-CM

## 2011-03-11 DIAGNOSIS — Z800 Family history of malignant neoplasm of digestive organs: Secondary | ICD-10-CM

## 2011-03-11 DIAGNOSIS — D126 Benign neoplasm of colon, unspecified: Secondary | ICD-10-CM

## 2011-03-11 DIAGNOSIS — Z8371 Family history of colonic polyps: Secondary | ICD-10-CM

## 2011-03-11 LAB — GLUCOSE, CAPILLARY
Glucose-Capillary: 119 mg/dL — ABNORMAL HIGH (ref 70–99)
Glucose-Capillary: 102 mg/dL — ABNORMAL HIGH (ref 70–99)

## 2011-03-11 MED ORDER — SODIUM CHLORIDE 0.9 % IV SOLN: 500.0000 mL | INTRAVENOUS | Status: DC

## 2011-03-11 NOTE — Patient Instructions (Signed)
Resume medications. Information given on polyps. D/C Instructions completed.

## 2011-03-12 ENCOUNTER — Telehealth: Payer: Self-pay | Admitting: *Deleted

## 2011-03-12 ENCOUNTER — Telehealth: Payer: Self-pay | Admitting: Gastroenterology

## 2011-03-12 NOTE — Telephone Encounter (Signed)
Patient called and asked if anyone had ever had a reaction to the drugs given during his procedure.   Upon answering my questions, the patient stated that he had some shortness of breath and extreme jitteriness after his "surgery."    He wasn't sure where it had been, and stated that it was "on his stomach.". He denies having an endoscopy.    I kept asking the patient if he was okay now.  Finally, he told me that he had slight shortness of breath and was really jittery again.   I told him that he should probably go to the ED if his heart were racing.  He said that his heart wasn't racing that he "just felt jittery."   No sweating, no chest pain.   Patient was unable to recall the names of the medications that he had during his "surgery."   I told him that he needed to contact the place where he had the surgery and find out what they gave him.    I told him that he probably shouldn't take that medication again.   He stated that he would "look into it."  In the meantime, the patient refused to go to his PCP or the ED.  He stated that if "it got worse " he would go to the ED.   Stated that last time it happened, he just "wallked the floor for two days, and it went away."    I could not convince the patient to be seen today.  Patient has no pain.  Will forward to Dr. Arlyce Dice.

## 2011-03-12 NOTE — Telephone Encounter (Signed)
No answer-Left message on I.d. Machine.

## 2011-03-23 ENCOUNTER — Ambulatory Visit: Payer: Medicare Other | Admitting: Family Medicine

## 2011-03-26 ENCOUNTER — Telehealth: Payer: Self-pay | Admitting: Family Medicine

## 2011-03-26 ENCOUNTER — Ambulatory Visit: Payer: Medicare Other | Admitting: Family Medicine

## 2011-03-26 NOTE — Telephone Encounter (Signed)
Pt called and said that he is sorry that he forgot about his ov today at 9:30am. Pt has rsc for Dec 10 at 10am.

## 2011-03-26 NOTE — Telephone Encounter (Signed)
noted 

## 2011-04-12 ENCOUNTER — Encounter: Payer: Self-pay | Admitting: Family Medicine

## 2011-04-12 ENCOUNTER — Ambulatory Visit (INDEPENDENT_AMBULATORY_CARE_PROVIDER_SITE_OTHER): Payer: Medicare Other | Admitting: Family Medicine

## 2011-04-12 VITALS — BP 130/72 | Temp 98.4°F | Wt 284.0 lb

## 2011-04-12 DIAGNOSIS — G609 Hereditary and idiopathic neuropathy, unspecified: Secondary | ICD-10-CM

## 2011-04-12 DIAGNOSIS — N189 Chronic kidney disease, unspecified: Secondary | ICD-10-CM

## 2011-04-12 DIAGNOSIS — Z23 Encounter for immunization: Secondary | ICD-10-CM

## 2011-04-12 DIAGNOSIS — E785 Hyperlipidemia, unspecified: Secondary | ICD-10-CM

## 2011-04-12 DIAGNOSIS — E119 Type 2 diabetes mellitus without complications: Secondary | ICD-10-CM

## 2011-04-12 LAB — HEMOGLOBIN A1C: Hgb A1c MFr Bld: 7.8 % — ABNORMAL HIGH (ref 4.6–6.5)

## 2011-04-12 LAB — BASIC METABOLIC PANEL
Calcium: 9 mg/dL (ref 8.4–10.5)
GFR: 42.89 mL/min — ABNORMAL LOW (ref >60.00)
Potassium: 4.9 mEq/L (ref 3.5–5.1)
Sodium: 141 mEq/L (ref 135–145)

## 2011-04-12 NOTE — Progress Notes (Signed)
  Subjective:    Patient ID: Frank Ashley, male    DOB: 07-28-1940, 70 y.o.   MRN: [id number]  HPI  Patient seen for medical followup. He has history of obesity, CAD, dyslipidemia, peripheral neuropathy, type 2 diabetes, history pancreatitis, chronic kidney disease, and obstructive sleep apnea. Medications reviewed. Blood sugars relatively stable. Last A1c 8.1%. No hypoglycemia. He has chronic peripheral neuropathy unchanged. Recent colonoscopy in November with 1 benign polyp. Strong family history of colon cancer. Takes high dose 70/30 insulin.  No recent hypoglycemia.  Patient denies recent chest pains. No dizziness. No dyspnea. Overall feels well. Long history of right great toe plantar ulcer. Has been seen at wound care center multiple times. No recent drainage.  Past Medical History  Diagnosis Date  . Allergy   . Diabetes mellitus     Type 2  . GERD (gastroesophageal reflux disease)   . Hyperlipidemia   . Peripheral neuropathy     peripheral neuropathy  . Renal insufficiency   . Diabetic foot ulcers   . Overweight   . Sleep apnea     CPAP  . Pancreatitis 02/2007    ACUTE  . MRSA (methicillin resistant Staphylococcus aureus)    Past Surgical History  Procedure Date  . Coronary angioplasty with stent placement   . Penile prosthesis implant   . Partial gastrectomy 1973    reports that he quit smoking about 51 years ago. His smoking use included Cigarettes. He has never used smokeless tobacco. He reports that he does not drink alcohol. His drug history not on file. family history includes Colon cancer (age of onset:61) in his sister; Colon cancer (age of onset:74) in his mother; and Colon cancer (age of onset:75) in his brother.  There is no history of Stomach cancer. Allergies  Allergen Reactions  . Hydrocodone   . Sulfamethoxazole     REACTION: hives      Review of Systems  Constitutional: Negative for chills, appetite change, fatigue and unexpected weight change.    Respiratory: Negative for cough and shortness of breath.   Cardiovascular: Negative for chest pain, palpitations and leg swelling.  Gastrointestinal: Negative for abdominal pain.  Genitourinary: Negative for dysuria.  Neurological: Negative for dizziness and syncope.       Objective:   Physical Exam  Constitutional: He appears well-developed and well-nourished.  HENT:  Mouth/Throat: Oropharynx is clear and moist.  Neck: Neck supple. No thyromegaly present.  Cardiovascular: Normal rate and regular rhythm.   Pulmonary/Chest: Effort normal and breath sounds normal. No respiratory distress. He has no wheezes. He has no rales.  Musculoskeletal: He exhibits no edema.       Very small superficial ulcer right great toe plantar surface. No erythema. No drainage. Overall this is much smaller than we have seen this in years  Psychiatric: He has a normal mood and affect. His behavior is normal.          Assessment & Plan:  #1 type 2 diabetes. Recheck A1c. Titrate insulin as indicated #2 chronic kidney disease. Recheck basic metabolic panel  #3 hypertension stable and at goal. Continue lisinopril and furosemide #4 hyperlipidemia. Lipids at goal by recent lab work.

## 2011-04-13 NOTE — Progress Notes (Signed)
Quick Note:  Pt informed on personally identified VM ______ 

## 2011-04-21 ENCOUNTER — Encounter (INDEPENDENT_AMBULATORY_CARE_PROVIDER_SITE_OTHER): Payer: Medicare Other | Admitting: Ophthalmology

## 2011-05-12 ENCOUNTER — Encounter (INDEPENDENT_AMBULATORY_CARE_PROVIDER_SITE_OTHER): Payer: Medicare Other | Admitting: Ophthalmology

## 2011-06-15 ENCOUNTER — Other Ambulatory Visit: Payer: Self-pay | Admitting: Family Medicine

## 2011-06-24 NOTE — Telephone Encounter (Signed)
completed

## 2011-06-30 ENCOUNTER — Telehealth: Payer: Self-pay | Admitting: Family Medicine

## 2011-06-30 MED ORDER — AMOXICILLIN-POT CLAVULANATE 875-125 MG PO TABS: 1.0000 | ORAL_TABLET | Freq: Two times a day (BID) | ORAL | Status: DC

## 2011-06-30 NOTE — Telephone Encounter (Signed)
Refill once but patient needs to be seen if having recurrent cellulitis of the foot

## 2011-06-30 NOTE — Telephone Encounter (Signed)
Pt informed.  50th wedding anniversary cruise to [place], toe just a little red now, preventive measure

## 2011-06-30 NOTE — Telephone Encounter (Signed)
Pt called and is req refill of amoxicillin-clavulanate (AUGMENTIN) 875-125 MG per tablet for diabetic toe. Pls call in to [facility] in [place]. Pt req to be notified when this med has been called in.

## 2011-06-30 NOTE — Telephone Encounter (Signed)
Please advise, last filled 12-25-10, #20 with 0 refills

## 2011-08-03 ENCOUNTER — Ambulatory Visit (INDEPENDENT_AMBULATORY_CARE_PROVIDER_SITE_OTHER): Payer: Medicare Other | Admitting: Family

## 2011-08-03 ENCOUNTER — Encounter: Payer: Self-pay | Admitting: Family

## 2011-08-03 VITALS — BP 124/68 | Temp 98.4°F | Wt 284.0 lb

## 2011-08-03 DIAGNOSIS — E119 Type 2 diabetes mellitus without complications: Secondary | ICD-10-CM

## 2011-08-03 DIAGNOSIS — J988 Other specified respiratory disorders: Secondary | ICD-10-CM

## 2011-08-03 DIAGNOSIS — R05 Cough: Secondary | ICD-10-CM

## 2011-08-03 MED ORDER — PREDNISONE 20 MG PO TABS: ORAL_TABLET | ORAL | Status: AC

## 2011-08-03 MED ORDER — AMOXICILLIN-POT CLAVULANATE 875-125 MG PO TABS: 1.0000 | ORAL_TABLET | Freq: Two times a day (BID) | ORAL | Status: DC

## 2011-08-03 NOTE — Progress Notes (Signed)
Subjective:    Patient ID: Frank Ashley, male    DOB: 1940-08-24, 71 y.o.   MRN: [id number]  HPI 71 year old white male, nonsmoker, patient of Dr. Caryl Never is in today with complaints of cough, wheezing, sore throat, headache, congestion x5 days. He's been taking Augmentin for 3 days that has helped. He is out of medication. He is to take an over-the-counter aspirin with no relief. He reports a productive cough with green to gray sputum. Patient has fatigue. Denies lightheadedness, dizziness, chest pain, palpitation or edema.   Review of Systems  Constitutional: Positive for fatigue.  HENT: Positive for congestion, sore throat, rhinorrhea and postnasal drip.   Eyes: Negative.   Respiratory: Positive for cough, shortness of breath and wheezing.   Cardiovascular: Negative.  Negative for chest pain.  Musculoskeletal: Negative.   Skin: Negative.   Neurological: Positive for headaches. Negative for dizziness and light-headedness.  Hematological: Negative.   Psychiatric/Behavioral: Negative.    Past Medical History  Diagnosis Date  . Allergy   . Diabetes mellitus     Type 2  . GERD (gastroesophageal reflux disease)   . Hyperlipidemia   . Peripheral neuropathy     peripheral neuropathy  . Renal insufficiency   . Diabetic foot ulcers   . Overweight   . Sleep apnea     CPAP  . Pancreatitis 02/2007    ACUTE  . MRSA (methicillin resistant Staphylococcus aureus)     History   Social History  . Marital Status: Married    Spouse Name: N/A    Number of Children: N/A  . Years of Education: N/A   Occupational History  . Not on file.   Social History Main Topics  . Smoking status: Former Smoker    Types: Cigarettes    Quit date: 02/25/1960  . Smokeless tobacco: Never Used  . Alcohol Use: No  . Drug Use: Not on file  . Sexually Active: Not on file   Other Topics Concern  . Not on file   Social History Narrative  . No narrative on file    Past Surgical History    Procedure Date  . Coronary angioplasty with stent placement   . Penile prosthesis implant   . Partial gastrectomy 1973    Family History  Problem Relation Age of Onset  . Colon cancer Mother 96  . Colon cancer Sister 55  . Colon cancer Brother 68  . Stomach cancer Neg Hx     Allergies  Allergen Reactions  . Hydrocodone   . Sulfamethoxazole     REACTION: hives    Current Outpatient Prescriptions on File Prior to Visit  Medication Sig Dispense Refill  . aspirin 325 MG EC tablet Take 325 mg by mouth 2 (two) times daily.        . fenofibrate 160 MG tablet TAKE ONE TABLET BY MOUTH EVERY DAY  90 tablet  3  . fexofenadine (ALLEGRA) 180 MG tablet Take 180 mg by mouth daily as needed.       . furosemide (LASIX) 40 MG tablet TAKE ONE TABLET BY MOUTH EVERY DAY  90 tablet  3  . gabapentin (NEURONTIN) 800 MG tablet TAKE 1 TABLET BY MOUTH 5 TIMES DAILY  450 tablet  3  . glucose blood test strip 1 each by Other route as needed. Check fasting blood sugar in the morning as directed       . INS SYRINGE/NEEDLE 1CC/29G (RELION INSULIN SYRINGE) 29G X 1/2" 1 ML MISC by Does not  apply route. Use as directed two times a day       . insulin NPH-insulin regular (NOVOLIN 70/30) (70-30) 100 UNIT/ML injection Inject 80  units subcutaneously in the morning and 75 units in the evening as directed  10 mL  6  . Insulin Pen Needle (1ST CHOICE PEN NEEDLES) 31G X 8 MM MISC by Does not apply route.        [name] lisinopril (PRINIVIL,ZESTRIL) 10 MG tablet TAKE ONE TABLET BY MOUTH EVERY DAY  90 tablet  0  . metoclopramide (REGLAN) 10 MG tablet Take 10 mg by mouth 4 (four) times daily. One by mouth qid- AC meals and q hs as needed for nausea and vomiting       . omeprazole (PRILOSEC) 20 MG capsule TAKE ONE CAPSULE BY MOUTH TWICE DAILY  180 capsule  0  . omeprazole (PRILOSEC) 40 MG capsule Take 1 capsule (40 mg total) by mouth daily.  30 capsule  2  . oxiconazole (OXISTAT) 1 % CREA Apply topically as needed. Use as directed        . simvastatin (ZOCOR) 40 MG tablet TAKE ONE TABLET BY MOUTH EVERY DAY  90 tablet  3    BP 124/68  Temp(Src) 98.4 F (36.9 C) (Oral)  Wt 284 lb (128.822 kg)chart    Objective:   Physical Exam  Constitutional: He is oriented to person, place, and time. He appears well-developed and well-nourished.  HENT:  Head: Normocephalic and atraumatic.  Right Ear: External ear normal.  Mouth/Throat: Oropharynx is clear and moist.  Neck: Normal range of motion. Neck supple.  Cardiovascular: Normal rate, regular rhythm and normal heart sounds.   Pulmonary/Chest: Effort normal. He has wheezes.  Abdominal: Soft. Bowel sounds are normal.  Neurological: He is alert and oriented to person, place, and time.  Skin: Skin is warm and dry.  Psychiatric: He has a normal mood and affect.          Assessment & Plan:  Assessment: Acute bronchitis, cough, wheezing  Plan: Prednisone 20 mg tablets 60 by mouth every morning x3 days, 40 by mouth every morning x3 days, 20 mg by mouth every morning x3 days. Augmentin 875 twice a day x10 days. Rest. Drink plenty of fluids. Call the office if symptoms worsen or persist, recheck as scheduled and when necessary.

## 2011-08-03 NOTE — Patient Instructions (Signed)

## 2011-08-10 ENCOUNTER — Ambulatory Visit (INDEPENDENT_AMBULATORY_CARE_PROVIDER_SITE_OTHER): Payer: Medicare Other | Admitting: Family Medicine

## 2011-08-10 ENCOUNTER — Encounter: Payer: Self-pay | Admitting: Family Medicine

## 2011-08-10 VITALS — BP 140/60 | Temp 99.0°F | Wt 292.0 lb

## 2011-08-10 DIAGNOSIS — F411 Generalized anxiety disorder: Secondary | ICD-10-CM

## 2011-08-10 DIAGNOSIS — R21 Rash and other nonspecific skin eruption: Secondary | ICD-10-CM

## 2011-08-10 DIAGNOSIS — N189 Chronic kidney disease, unspecified: Secondary | ICD-10-CM

## 2011-08-10 DIAGNOSIS — E119 Type 2 diabetes mellitus without complications: Secondary | ICD-10-CM

## 2011-08-10 DIAGNOSIS — F419 Anxiety disorder, unspecified: Secondary | ICD-10-CM

## 2011-08-10 DIAGNOSIS — E1169 Type 2 diabetes mellitus with other specified complication: Secondary | ICD-10-CM

## 2011-08-10 DIAGNOSIS — E785 Hyperlipidemia, unspecified: Secondary | ICD-10-CM

## 2011-08-10 DIAGNOSIS — R05 Cough: Secondary | ICD-10-CM

## 2011-08-10 LAB — BASIC METABOLIC PANEL
Chloride: 104 mEq/L (ref 96–112)
Creatinine, Ser: 2.1 mg/dL — ABNORMAL HIGH (ref 0.4–1.5)
Potassium: 4.8 mEq/L (ref 3.5–5.1)
Sodium: 142 mEq/L (ref 135–145)

## 2011-08-10 LAB — HEPATIC FUNCTION PANEL
ALT: 16 U/L (ref 0–53)
AST: 16 U/L (ref 0–37)
Albumin: 3.3 g/dL — ABNORMAL LOW (ref 3.5–5.2)
Alkaline Phosphatase: 42 U/L (ref 39–117)
Bilirubin, Direct: 0 mg/dL (ref 0.0–0.3)
Total Bilirubin: 0.3 mg/dL (ref 0.3–1.2)
Total Protein: 6 g/dL (ref 6.0–8.3)

## 2011-08-10 LAB — LIPID PANEL
Cholesterol: 131 mg/dL (ref 0–200)
HDL: 32.4 mg/dL — ABNORMAL LOW (ref >39.00)
Total CHOL/HDL Ratio: 4
Triglycerides: 252 mg/dL — ABNORMAL HIGH (ref 0.0–149.0)
VLDL: 50.4 mg/dL — ABNORMAL HIGH (ref 0.0–40.0)

## 2011-08-10 MED ORDER — TETANUS-DIPHTH-ACELL PERTUSSIS 5-2.5-18.5 LF-MCG/0.5 IM SUSP: 0.5000 mL | Freq: Once | INTRAMUSCULAR | Status: DC

## 2011-08-10 MED ORDER — TRIAMCINOLONE ACETONIDE 0.1 % EX CREA: TOPICAL_CREAM | Freq: Two times a day (BID) | CUTANEOUS | Status: DC

## 2011-08-10 MED ORDER — ALPRAZOLAM 0.5 MG PO TABS: 0.5000 mg | ORAL_TABLET | Freq: Three times a day (TID) | ORAL | Status: AC | PRN

## 2011-08-10 NOTE — Progress Notes (Signed)
Subjective:    Patient ID: Frank Ashley, male    DOB: [birth year], 71 y.o.   MRN: [id number]  HPI  Patient seen for followup several items. History of obesity, type 2 diabetes with complications including diabetic nephropathy and peripheral neuropathy, hyperlipidemia, chronic kidney disease, GERD, and chronic ulcer right great toe which has been resistant to treatment including wound care center.  Recent URI. Presented here with wheezing. Placed on prednisone and as expected his blood sugars have increased since then. Overall less wheezing and improving slowly. Also taking Augmentin. No fever. Nonsmoker. Cough mostly nonproductive.  Right great toe ulcer. No drainage. Has not had any recent erythema.  Pruritic non-scaly rash right chest wall. This has been present for several weeks and somewhat intermittent. No left-sided involvement. No exacerbating features. He's tried topical moisturizers without improvement.  Type 2 diabetes well controlled prior to recent prednisone. Fasting blood sugars mostly 110 to 140 range. No symptoms of hyperglycemia. Needs follow diabetic eye exam. Also cannot confirm last tetanus.  Past Medical History  Diagnosis Date  . Allergy   . Diabetes mellitus     Type 2  . GERD (gastroesophageal reflux disease)   . Hyperlipidemia   . Peripheral neuropathy     peripheral neuropathy  . Renal insufficiency   . Diabetic foot ulcers   . Overweight   . Sleep apnea     CPAP  . Pancreatitis 02/2007    ACUTE  . MRSA (methicillin resistant Staphylococcus aureus)    Past Surgical History  Procedure Date  . Coronary angioplasty with stent placement   . Penile prosthesis implant   . Partial gastrectomy 1973    reports that he quit smoking about 51 years ago. His smoking use included Cigarettes. He has never used smokeless tobacco. He reports that he does not drink alcohol. His drug history not on file. family history includes Colon cancer (age of onset:61) in his  sister; Colon cancer (age of onset:74) in his mother; and Colon cancer (age of onset:75) in his brother.  There is no history of Stomach cancer. Allergies  Allergen Reactions  . Hydrocodone   . Sulfamethoxazole     REACTION: hives      Review of Systems  Constitutional: Negative for fatigue.  Eyes: Negative for visual disturbance.  Respiratory: Negative for cough, chest tightness and shortness of breath.   Cardiovascular: Negative for chest pain, palpitations and leg swelling.  Neurological: Negative for dizziness, syncope, weakness, light-headedness and headaches.       Objective:   Physical Exam  Constitutional: He appears well-developed and well-nourished.  Cardiovascular: Normal rate and regular rhythm.   Pulmonary/Chest: Effort normal and breath sounds normal. No respiratory distress. He has no wheezes. He has no rales.  Musculoskeletal: He exhibits no edema.       Patient has markedly diminished sensation to monofilament testing on both feet and lower legs. Right great toe reveals 6 x 7 mm diabetic ulcer with no erythema no drainage. This is approximately 2 mm deep. This been present for several years without healing. A much smaller size than previously. Good capillary refill both feet. 1 posterior dorsalis pedis pulses bilaterally. No other foot lesions noted.  Skin:       Patient has macular erythematous nonscaly rash which blanches to pressure below the right axillary region. This covers a fairly large area about 14-16 cm. No pustules. Nontender.  Psychiatric: He has a normal mood and affect. His behavior is normal.  Assessment & Plan:  #1 type 2 diabetes. Recheck A1c. Maybe slightly worse because of recent prednisone  #2 dyslipidemia. Recheck lipid and hepatic panel #3 chronic kidney disease. Recheck basic metabolic panel  #4 nonspecific rash right chest wall. This appears to be more likely allergic in appearance. Try triamcinolone 0.1% cream twice daily  #5  recent respiratory illness. Suspect viral. Add Mucinex for cough  #6 chronic diabetic toe ulcer.  Has been followed at  Wound care center.  No signs of infections

## 2011-08-10 NOTE — Patient Instructions (Addendum)
Try over the counter mucinex for chest congestion. Set up eye exam soon.

## 2011-08-13 NOTE — Progress Notes (Signed)
Quick Note:  Pt informed, he does not want a referral to endocrine yet. He would like to comply and monitor his sugars better for 90 days. If next A1C is still high, will reconsider. Copy of labs mailed to home. ______

## 2011-09-13 ENCOUNTER — Ambulatory Visit (INDEPENDENT_AMBULATORY_CARE_PROVIDER_SITE_OTHER): Payer: Medicare Other | Admitting: Family Medicine

## 2011-09-13 ENCOUNTER — Telehealth: Payer: Self-pay | Admitting: Family Medicine

## 2011-09-13 ENCOUNTER — Encounter: Payer: Self-pay | Admitting: Family Medicine

## 2011-09-13 VITALS — BP 150/80 | Temp 98.7°F | Wt 228.0 lb

## 2011-09-13 DIAGNOSIS — R112 Nausea with vomiting, unspecified: Secondary | ICD-10-CM

## 2011-09-13 DIAGNOSIS — R1013 Epigastric pain: Secondary | ICD-10-CM

## 2011-09-13 LAB — CBC WITH DIFFERENTIAL/PLATELET
Basophils Relative: 0.3 % (ref 0.0–3.0)
Eosinophils Absolute: 0.1 10*3/uL (ref 0.0–0.7)
Eosinophils Relative: 0.6 % (ref 0.0–5.0)
Hemoglobin: 10.8 g/dL — ABNORMAL LOW (ref 13.0–17.0)
Lymphocytes Relative: 13.6 % (ref 12.0–46.0)
MCHC: 32.3 g/dL (ref 30.0–36.0)
MCV: 76.9 fl — ABNORMAL LOW (ref 78.0–100.0)
Monocytes Absolute: 1.1 10*3/uL — ABNORMAL HIGH (ref 0.1–1.0)
Neutro Abs: 7.1 10*3/uL (ref 1.4–7.7)
Neutrophils Relative %: 74.2 % (ref 43.0–77.0)
RBC: 4.33 Mil/uL (ref 4.22–5.81)
WBC: 9.5 10*3/uL (ref 4.5–10.5)

## 2011-09-13 LAB — LIPASE: Lipase: 64 U/L — ABNORMAL HIGH (ref 11.0–59.0)

## 2011-09-13 MED ORDER — PROMETHAZINE HCL 25 MG/ML IJ SOLN: 25.0000 mg | Freq: Once | INTRAMUSCULAR | Status: DC

## 2011-09-13 MED ORDER — ONDANSETRON 8 MG PO TBDP: 8.0000 mg | ORAL_TABLET | Freq: Three times a day (TID) | ORAL | Status: DC | PRN

## 2011-09-13 NOTE — Telephone Encounter (Signed)
Pt has scheduled OV today

## 2011-09-13 NOTE — Progress Notes (Signed)
Subjective:    Patient ID: Frank Ashley, male    DOB: 1940/08/13, 71 y.o.   MRN: [id number]  HPI  Acute visit. Epigastric pain associated with nausea and vomiting along with some diarrhea. Onset around 6 PM last night. Last food intake was [place] food around noon yesterday. Patient has known history of gallstones which have been noted incidentally but denies any right upper quadrant pain. Pain is relatively constant and radiates toward the back. Denies any chest pain. He had vomiting last night and once this morning early but none since then. His pain is actually somewhat improved today compared to yesterday. He has history of recurrent pancreatitis the past. He takes Prilosec chronically for GERD. No recent active GERD symptoms.  No exacerbating features. No alleviating factors. Diarrhea yesterday was nonbloody. No exertional symptoms. Blood sugar fasting this morning 171. Did not take insulin because of decreased food intake.  Past Medical History  Diagnosis Date  . Allergy   . Diabetes mellitus     Type 2  . GERD (gastroesophageal reflux disease)   . Hyperlipidemia   . Peripheral neuropathy     peripheral neuropathy  . Renal insufficiency   . Diabetic foot ulcers   . Overweight   . Sleep apnea     CPAP  . Pancreatitis 02/2007    ACUTE  . MRSA (methicillin resistant Staphylococcus aureus)    Past Surgical History  Procedure Date  . Coronary angioplasty with stent placement   . Penile prosthesis implant   . Partial gastrectomy 1973    reports that he quit smoking about 51 years ago. His smoking use included Cigarettes. He has never used smokeless tobacco. He reports that he does not drink alcohol. His drug history not on file. family history includes Colon cancer (age of onset:61) in his sister; Colon cancer (age of onset:74) in his mother; and Colon cancer (age of onset:75) in his brother.  There is no history of Stomach cancer. Allergies  Allergen Reactions  . Hydrocodone    . Sulfamethoxazole     REACTION: hives     Review of Systems  Constitutional: Positive for appetite change. Negative for fever, chills and unexpected weight change.  Respiratory: Negative for cough and shortness of breath.   Cardiovascular: Negative for chest pain, palpitations and leg swelling.  Gastrointestinal: Positive for nausea, vomiting, abdominal pain and diarrhea. Negative for blood in stool.  Genitourinary: Negative for dysuria.  Neurological: Negative for dizziness and syncope.  Hematological: Negative for adenopathy.       Objective:   Physical Exam  Constitutional: He appears well-developed and well-nourished.  HENT:  Mouth/Throat: Oropharynx is clear and moist.  Cardiovascular: Normal rate and regular rhythm.   Pulmonary/Chest: Effort normal and breath sounds normal. No respiratory distress. He has no wheezes. He has no rales.  Abdominal: Soft. Bowel sounds are normal. He exhibits no distension and no mass. There is no rebound and no guarding.       Patient has some mild epigastric tenderness. No right upper quadrant tenderness  Musculoskeletal: He exhibits no edema.          Assessment & Plan:  Epigastric abdominal pain associated with nausea and vomiting and some diarrhea. Differential is gastroenteritis, recurrent pancreatitis, less likely peptic disease (on chronic Prilosec).  Symptoms did not sound cardiac but he is very high risk  Check EKG. Check labs of amylase, lipase, and CBC. Phenergan 25 mg IM given. Zofran prescription 8 mg every 8 hours as needed. Start Nexium  40 mg daily in place of Prilosec.

## 2011-09-13 NOTE — Telephone Encounter (Signed)
Called at 8:45a.m. Pulled from Triage vmail. Wife states she needs to speak with a nurse right away as her husband is having severe abd pain. Please call .

## 2011-09-13 NOTE — Patient Instructions (Signed)
Nexium one daily- use in place of Prilosec for now. Bland diet. Start with clear liquid diet. Follow up immediately for any fever, recurrent vomiting,or any worsening abdominal pain.

## 2011-09-14 ENCOUNTER — Inpatient Hospital Stay (HOSPITAL_COMMUNITY)
Admission: EM | Admit: 2011-09-14 | Discharge: 2011-09-21 | DRG: 872 | Disposition: A | Discharge status: Home / Self Care | Payer: Medicare Other | Attending: Internal Medicine | Admitting: Internal Medicine

## 2011-09-14 ENCOUNTER — Emergency Department (HOSPITAL_COMMUNITY): Payer: Medicare Other

## 2011-09-14 ENCOUNTER — Encounter (HOSPITAL_COMMUNITY): Payer: Self-pay | Admitting: *Deleted

## 2011-09-14 DIAGNOSIS — Z903 Acquired absence of stomach [part of]: Secondary | ICD-10-CM

## 2011-09-14 DIAGNOSIS — R112 Nausea with vomiting, unspecified: Secondary | ICD-10-CM | POA: Diagnosis present

## 2011-09-14 DIAGNOSIS — E663 Overweight: Secondary | ICD-10-CM | POA: Diagnosis present

## 2011-09-14 DIAGNOSIS — L97409 Non-pressure chronic ulcer of unspecified heel and midfoot with unspecified severity: Secondary | ICD-10-CM | POA: Diagnosis present

## 2011-09-14 DIAGNOSIS — R7881 Bacteremia: Secondary | ICD-10-CM | POA: Diagnosis present

## 2011-09-14 DIAGNOSIS — G4730 Sleep apnea, unspecified: Secondary | ICD-10-CM

## 2011-09-14 DIAGNOSIS — E1149 Type 2 diabetes mellitus with other diabetic neurological complication: Secondary | ICD-10-CM | POA: Diagnosis present

## 2011-09-14 DIAGNOSIS — R739 Hyperglycemia, unspecified: Secondary | ICD-10-CM

## 2011-09-14 DIAGNOSIS — K859 Acute pancreatitis without necrosis or infection, unspecified: Secondary | ICD-10-CM

## 2011-09-14 DIAGNOSIS — A4902 Methicillin resistant Staphylococcus aureus infection, unspecified site: Secondary | ICD-10-CM

## 2011-09-14 DIAGNOSIS — E119 Type 2 diabetes mellitus without complications: Secondary | ICD-10-CM

## 2011-09-14 DIAGNOSIS — N189 Chronic kidney disease, unspecified: Secondary | ICD-10-CM | POA: Diagnosis present

## 2011-09-14 DIAGNOSIS — E1142 Type 2 diabetes mellitus with diabetic polyneuropathy: Secondary | ICD-10-CM | POA: Diagnosis present

## 2011-09-14 DIAGNOSIS — R197 Diarrhea, unspecified: Secondary | ICD-10-CM | POA: Diagnosis not present

## 2011-09-14 DIAGNOSIS — K8309 Other cholangitis: Secondary | ICD-10-CM | POA: Diagnosis present

## 2011-09-14 DIAGNOSIS — K8020 Calculus of gallbladder without cholecystitis without obstruction: Secondary | ICD-10-CM

## 2011-09-14 DIAGNOSIS — A413 Sepsis due to Hemophilus influenzae: Secondary | ICD-10-CM

## 2011-09-14 DIAGNOSIS — L97509 Non-pressure chronic ulcer of other part of unspecified foot with unspecified severity: Secondary | ICD-10-CM | POA: Diagnosis present

## 2011-09-14 DIAGNOSIS — Z9889 Other specified postprocedural states: Secondary | ICD-10-CM

## 2011-09-14 DIAGNOSIS — E1169 Type 2 diabetes mellitus with other specified complication: Secondary | ICD-10-CM | POA: Diagnosis present

## 2011-09-14 DIAGNOSIS — E785 Hyperlipidemia, unspecified: Secondary | ICD-10-CM | POA: Diagnosis present

## 2011-09-14 DIAGNOSIS — G609 Hereditary and idiopathic neuropathy, unspecified: Secondary | ICD-10-CM

## 2011-09-14 DIAGNOSIS — J309 Allergic rhinitis, unspecified: Secondary | ICD-10-CM

## 2011-09-14 DIAGNOSIS — A419 Sepsis, unspecified organism: Principal | ICD-10-CM | POA: Diagnosis present

## 2011-09-14 DIAGNOSIS — R1900 Intra-abdominal and pelvic swelling, mass and lump, unspecified site: Secondary | ICD-10-CM

## 2011-09-14 DIAGNOSIS — K219 Gastro-esophageal reflux disease without esophagitis: Secondary | ICD-10-CM

## 2011-09-14 HISTORY — DX: Atherosclerotic heart disease of native coronary artery without angina pectoris: I25.10

## 2011-09-14 LAB — DIFFERENTIAL
Basophils Relative: 0 % (ref 0–1)
Eosinophils Absolute: 0 10*3/uL (ref 0.0–0.7)
Monocytes Absolute: 0.3 10*3/uL (ref 0.1–1.0)
Monocytes Relative: 4 % (ref 3–12)

## 2011-09-14 LAB — URINALYSIS, ROUTINE W REFLEX MICROSCOPIC
Glucose, UA: >1000 mg/dL — AB
Hgb urine dipstick: NEGATIVE
Protein, ur: 30 mg/dL — AB

## 2011-09-14 LAB — COMPREHENSIVE METABOLIC PANEL
Albumin: 3.4 g/dL — ABNORMAL LOW (ref 3.5–5.2)
BUN: 22 mg/dL (ref 6–23)
Chloride: 98 mEq/L (ref 96–112)
Creatinine, Ser: 1.72 mg/dL — ABNORMAL HIGH (ref 0.50–1.35)
GFR calc Af Amer: 45 mL/min — ABNORMAL LOW (ref >90)
Total Bilirubin: 5.8 mg/dL — ABNORMAL HIGH (ref 0.3–1.2)
Total Protein: 7.4 g/dL (ref 6.0–8.3)

## 2011-09-14 LAB — CBC
HCT: 36.6 % — ABNORMAL LOW (ref 39.0–52.0)
Hemoglobin: 12 g/dL — ABNORMAL LOW (ref 13.0–17.0)
MCH: 24.7 pg — ABNORMAL LOW (ref 26.0–34.0)
MCHC: 32.8 g/dL (ref 30.0–36.0)

## 2011-09-14 LAB — LACTIC ACID, PLASMA: Lactic Acid, Venous: 3.7 mmol/L — ABNORMAL HIGH (ref 0.5–2.2)

## 2011-09-14 LAB — LIPASE, BLOOD: Lipase: 91 U/L — ABNORMAL HIGH (ref 11–59)

## 2011-09-14 MED ORDER — SODIUM CHLORIDE 0.9 % IV SOLN
INTRAVENOUS | Status: AC
  Administered 2011-09-14: 22:00:00 via INTRAVENOUS

## 2011-09-14 MED ORDER — SODIUM CHLORIDE 0.9 % IV BOLUS (SEPSIS)
1000.0000 mL | Freq: Once | INTRAVENOUS | Status: AC
  Administered 2011-09-14: 1000 mL via INTRAVENOUS

## 2011-09-14 MED ORDER — PIPERACILLIN-TAZOBACTAM 3.375 G IVPB
3.3750 g | Freq: Once | INTRAVENOUS | Status: AC
  Administered 2011-09-14: 3.375 g via INTRAVENOUS
  Filled 2011-09-14: qty 50

## 2011-09-14 MED ORDER — HYDROMORPHONE HCL PF 2 MG/ML IJ SOLN
2.0000 mg | Freq: Once | INTRAMUSCULAR | Status: AC
  Administered 2011-09-14: 2 mg via INTRAMUSCULAR
  Filled 2011-09-14: qty 1

## 2011-09-14 MED ORDER — ACETAMINOPHEN 325 MG PO TABS: 975.0000 mg | ORAL_TABLET | Freq: Once | ORAL | Status: DC

## 2011-09-14 MED ORDER — HYDROMORPHONE HCL PF 1 MG/ML IJ SOLN
1.0000 mg | INTRAMUSCULAR | Status: DC | PRN
  Administered 2011-09-14 – 2011-09-17 (×10): 1 mg via INTRAVENOUS
  Filled 2011-09-14 (×10): qty 1

## 2011-09-14 MED ORDER — ACETAMINOPHEN 325 MG RE SUPP
975.0000 mg | Freq: Once | RECTAL | Status: AC
  Administered 2011-09-14: 975 mg via RECTAL
  Filled 2011-09-14: qty 1

## 2011-09-14 MED ORDER — ONDANSETRON HCL 4 MG/2ML IJ SOLN: 4.0000 mg | Freq: Three times a day (TID) | INTRAMUSCULAR | Status: DC | PRN

## 2011-09-14 MED ORDER — IOHEXOL 300 MG/ML  SOLN
100.0000 mL | Freq: Once | INTRAMUSCULAR | Status: AC | PRN
  Administered 2011-09-14: 80 mL via INTRAVENOUS

## 2011-09-14 MED ORDER — VANCOMYCIN HCL IN DEXTROSE 1-5 GM/200ML-% IV SOLN
1000.0000 mg | Freq: Once | INTRAVENOUS | Status: AC
  Administered 2011-09-14: 1000 mg via INTRAVENOUS
  Filled 2011-09-14: qty 200

## 2011-09-14 NOTE — ED Provider Notes (Signed)
Medical screening examination/treatment/procedure(s) were conducted as a shared visit with non-physician practitioner(s) and myself.  I personally evaluated the patient during the encounter  abd exam benign, awaiting ct results  Toy Baker, MD 09/14/11 [phone]

## 2011-09-14 NOTE — ED Notes (Signed)
Patient returned from CT

## 2011-09-14 NOTE — ED Notes (Signed)
Pt reports RUQ pain radiating through to back since Sunday night. Hx of gallstones, pancreatitis. C/o n/v.

## 2011-09-14 NOTE — Progress Notes (Signed)
Quick Note:  Pt informed on his cell phone. Pt reports his wife is taking him to the ER right now, "the abd pain is worse, I can't stand it any longer". ______

## 2011-09-14 NOTE — ED Provider Notes (Signed)
History     CSN: [id number]  Arrival date & time 09/14/11  1241   First MD Initiated Contact with Patient 09/14/11 1353      Chief Complaint  Patient presents with  . Abdominal Pain    (Consider location/radiation/quality/duration/timing/severity/associated sxs/prior treatment) Patient is a 71 y.o. male presenting with abdominal pain. The history is provided by the patient.  Abdominal Pain The primary symptoms of the illness include abdominal pain, nausea and diarrhea. The primary symptoms of the illness do not include fever, shortness of breath, vomiting or dysuria. The current episode started more than 2 days ago. The onset of the illness was gradual. The problem has been gradually worsening.  Additional symptoms associated with the illness include back pain. Symptoms associated with the illness do not include chills or hematuria.  Pt with right upper abdomina pain for about a week, worsened 3 days ago. Was seen by his pcp yesterday, given nausea med, which helped, but today pain worsened. Pt states pain readiating into right scapula. Denies vomiting, fever, urinary symptoms.   Past Medical History  Diagnosis Date  . Allergy   . Diabetes mellitus     Type 2  . GERD (gastroesophageal reflux disease)   . Hyperlipidemia   . Peripheral neuropathy     peripheral neuropathy  . Renal insufficiency   . Diabetic foot ulcers   . Overweight   . Sleep apnea     CPAP  . Pancreatitis 02/2007    ACUTE  . MRSA (methicillin resistant Staphylococcus aureus)     Past Surgical History  Procedure Date  . Coronary angioplasty with stent placement   . Penile prosthesis implant   . Partial gastrectomy 1973    Family History  Problem Relation Age of Onset  . Colon cancer Mother 54  . Colon cancer Sister 48  . Colon cancer Brother 15  . Stomach cancer Neg Hx     History  Substance Use Topics  . Smoking status: Former Smoker    Types: Cigarettes    Quit date: 02/25/1960  .  Smokeless tobacco: Never Used  . Alcohol Use: No      Review of Systems  Constitutional: Negative for fever and chills.  Respiratory: Negative for chest tightness and shortness of breath.   Cardiovascular: Negative.   Gastrointestinal: Positive for nausea, abdominal pain and diarrhea. Negative for vomiting.  Genitourinary: Negative for dysuria and hematuria.  Musculoskeletal: Positive for back pain.  Skin: Negative.   Neurological: Positive for weakness. Negative for dizziness.    Allergies  Hydrocodone and Sulfamethoxazole  Home Medications   Current Outpatient Rx  Name Route Sig Dispense Refill  . ALPRAZOLAM 0.5 MG PO TABS Oral Take 0.5 mg by mouth 3 (three) times daily as needed. For anxiety.    . ASPIRIN 325 MG PO TBEC Oral Take 325 mg by mouth 2 (two) times daily.      [name] ESOMEPRAZOLE MAGNESIUM 40 MG PO CPDR Oral Take 40 mg by mouth daily before breakfast.    . FENOFIBRATE 160 MG PO TABS  TAKE ONE TABLET BY MOUTH EVERY DAY 90 tablet 3  . FUROSEMIDE 40 MG PO TABS Oral Take 40 mg by mouth daily as needed. For fluid retention.    [name] GABAPENTIN 800 MG PO TABS  TAKE 1 TABLET BY MOUTH 5 TIMES DAILY 450 tablet 3  . INSULIN ISOPHANE & REGULAR (70-30) 100 UNIT/ML [place] SUSP  Inject 80  units subcutaneously in the morning and 75 units in the evening  as directed 10 mL 6    Please provide pt with a 90 day supply per pt requ ...  . LISINOPRIL 10 MG PO TABS  TAKE ONE TABLET BY MOUTH EVERY DAY 90 tablet 0  . METOCLOPRAMIDE HCL 10 MG PO TABS Oral Take 10 mg by mouth 4 (four) times daily. One by mouth qid- AC meals and q hs as needed for nausea and vomiting     . ONDANSETRON 8 MG PO TBDP Oral Take 1 tablet (8 mg total) by mouth every 8 (eight) hours as needed for nausea. 15 tablet 0  . OXICONAZOLE NITRATE 1 % EX CREA Topical Apply 1 application topically as needed. For jock itch.    [name] SIMVASTATIN 40 MG PO TABS  TAKE ONE TABLET BY MOUTH EVERY DAY 90 tablet 3  . TRIAMCINOLONE ACETONIDE 0.1 % EX  CREA Topical Apply 1 application topically 2 (two) times daily as needed. For rash on back.    [name] GLUCOSE BLOOD VI STRP Other 1 each by Other route as needed. Check fasting blood sugar in the morning as directed     . INSULIN SYRINGE/NEEDLE 29G X 1/2" 1 ML MISC Does not apply by Does not apply route. Use as directed two times a day     . INSULIN PEN NEEDLE 31G X 8 MM MISC Does not apply by Does not apply route.        BP 164/64  Pulse 81  Temp 98 F (36.7 C)  Resp 20  SpO2 99%  Physical Exam  Nursing note and vitals reviewed. Constitutional: He is oriented to person, place, and time. He appears well-developed and well-nourished. He appears distressed.       Appears in pain  Eyes: Pupils are equal, round, and reactive to light.  Neck: Neck supple.  Cardiovascular: Normal rate, regular rhythm and normal heart sounds.   Pulmonary/Chest: Effort normal and breath sounds normal. No respiratory distress. He has no wheezes.  Abdominal: Soft. Bowel sounds are normal. He exhibits no distension and no mass. There is tenderness. There is guarding. There is no rebound.       Right upper quadrant tenderness to palpation. Guarding. Abdomen obese  Musculoskeletal: Normal range of motion.  Neurological: He is alert and oriented to person, place, and time.  Skin: Skin is warm and dry.  Psychiatric: He has a normal mood and affect.    ED Course  Procedures (including critical care time)  Pt with abdominal pain, hx of gallstone and pancreatitis. Will get labs, currently VS nromal other then hypertensive. He appears to be in a lot of pain, meds ordered.  Results for orders placed during the hospital encounter of 09/14/11  URINALYSIS, ROUTINE W REFLEX MICROSCOPIC      Component Value Range   Color, Urine AMBER (*) YELLOW    APPearance CLEAR  CLEAR    Specific Gravity, Urine 1.024  1.005 - 1.030    pH 6.5  5.0 - 8.0    Glucose, UA >1000 (*) NEGATIVE (mg/dL)   Hgb urine dipstick NEGATIVE  NEGATIVE      Bilirubin Urine LARGE (*) NEGATIVE    Ketones, ur TRACE (*) NEGATIVE (mg/dL)   Protein, ur 30 (*) NEGATIVE (mg/dL)   Urobilinogen, UA 1.0  0.0 - 1.0 (mg/dL)   Nitrite NEGATIVE  NEGATIVE    Leukocytes, UA NEGATIVE  NEGATIVE   URINE MICROSCOPIC-ADD ON      Component Value Range   Squamous Epithelial / LPF RARE  RARE  CBC      Component Value Range   WBC 7.4  4.0 - 10.5 (K/uL)   RBC 4.86  4.22 - 5.81 (MIL/uL)   Hemoglobin 12.0 (*) 13.0 - 17.0 (g/dL)   HCT 40.9 (*) 81.1 - 52.0 (%)   MCV 75.3 (*) 78.0 - 100.0 (fL)   MCH 24.7 (*) 26.0 - 34.0 (pg)   MCHC 32.8  30.0 - 36.0 (g/dL)   RDW 91.4 (*) 78.2 - 15.5 (%)   Platelets 229  150 - 400 (K/uL)  DIFFERENTIAL      Component Value Range   Neutrophils Relative 92 (*) 43 - 77 (%)   Neutro Abs 6.9  1.7 - 7.7 (K/uL)   Lymphocytes Relative 4 (*) 12 - 46 (%)   Lymphs Abs 0.3 (*) 0.7 - 4.0 (K/uL)   Monocytes Relative 4  3 - 12 (%)   Monocytes Absolute 0.3  0.1 - 1.0 (K/uL)   Eosinophils Relative 0  0 - 5 (%)   Eosinophils Absolute 0.0  0.0 - 0.7 (K/uL)   Basophils Relative 0  0 - 1 (%)   Basophils Absolute 0.0  0.0 - 0.1 (K/uL)  COMPREHENSIVE METABOLIC PANEL      Component Value Range   Sodium 131 (*) 135 - 145 (mEq/L)   Potassium 4.3  3.5 - 5.1 (mEq/L)   Chloride 98  96 - 112 (mEq/L)   CO2 20  19 - 32 (mEq/L)   Glucose, Bld 266 (*) 70 - 99 (mg/dL)   BUN 22  6 - 23 (mg/dL)   Creatinine, Ser 9.56 (*) 0.50 - 1.35 (mg/dL)   Calcium 9.0  8.4 - 21.3 (mg/dL)   Total Protein 7.4  6.0 - 8.3 (g/dL)   Albumin 3.4 (*) 3.5 - 5.2 (g/dL)   AST 086 (*) 0 - 37 (U/L)   ALT 232 (*) 0 - 53 (U/L)   Alkaline Phosphatase 240 (*) 39 - 117 (U/L)   Total Bilirubin 5.8 (*) 0.3 - 1.2 (mg/dL)   GFR calc non Af Amer 39 (*) >90 (mL/min)   GFR calc Af Amer 45 (*) >90 (mL/min)  LIPASE, BLOOD      Component Value Range   Lipase 91 (*) 11 - 59 (U/L)  LACTIC ACID, PLASMA      Component Value Range   Lactic Acid, Venous 3.7 (*) 0.5 - 2.2 (mmol/L)  6:00  PM Pt's VS worsened, he became tachycardic, felt warm to the touch. Temp checked again 102.6R. Ordered tylenol, fluids, 2nd IV, blood cultures, lactic acid.  [place] ordered to r/o cholecystitis vs cholangitis. LFTs and lipase elevated.   6:30 PM Pt feeling better with medications, Hr remains elevated, fluids running.  US Abdomen Complete  09/14/2011  *RADIOLOGY REPORT*  Clinical Data:  Abdominal pain.  ABDOMINAL ULTRASOUND COMPLETE  Comparison:  None.  Findings:  Gallbladder:  Multiple small less than 1 cm gallstones are seen. No evidence of gallbladder dilatation wall thickening.  Common Bile Duct:  Dilated measuring 9 mm.  Intrahepatic bile ducts also appear dilated in the left hepatic lobe.  Liver: Coarse increased echotexture, consistent with steatosis or cirrhosis.  No focal liver mass identified.  IVC:  Appears normal.  Pancreas:  Dilated pancreatic duct seen in the pancreatic body measuring 7 mm.  Pancreatic head not visualized.  Spleen:  Within normal limits in size and echotexture.  Right kidney:  Normal in size and parenchymal echogenicity.  No evidence of mass or hydronephrosis.  Left kidney:  Normal in size  and parenchymal echogenicity.  No evidence of mass or hydronephrosis.  Abdominal Aorta:  No aneurysm identified.  IMPRESSION:  1.  Cholelithiasis.  No sonographic signs of acute cholecystitis. 2.  Biliary and pancreatic ductal dilatation.  Etiology not visualized by ultrasound.  Pancreatic head mass or choledocholithiasis cannot be excluded.  Consider abdomen CT without and with contrast (pancreatic mass protocol) for further evaluation. 3.  Hepatic steatosis or cirrhosis.  Original Report Authenticated By: Danae Orleans, M.D.   [place] with biliary and pancreatic ductal dilatation. Will get CT for further eval. Antibiotics ordered.   8:10 PM Awaiting for CT scan. Now BP starting to decrease Last is 105/48, HR in the low 100s currently. Pt feeling better still. Denies pain. Vancomycin and zosyn  running.   PT admitted to triad for further evaluations.    1. Cholangitis   2. Abdominal pain   3. Sepsis   4. Hyperglycemia   5. Hyperlipidemia       MDM          Lottie Mussel, PA 09/17/11 [phone]

## 2011-09-14 NOTE — H&P (Addendum)
PCP:  Kristian Covey, MD, MD   Confirmed with pt   Chief Complaint:  Abd pain  HPI: 42yoM with h/o DM2 with peripheral neuropathy, CKD baseline Cr 2.0, h/o  pancreatitis, gallstones, and likely biliary colic, now presents with  sepsis from ascending cholangitis.   Pt states that off and on he's had epigastric pain and has known  gallstones previously noted on ultrasound. Last week, he had an episode  of epigastric pain that resolved on its own, however they went to eat  [place] food on Mother's Day and by that night he had severe epigastric  pain, somewhat in the RUQ, but radiating into just under the right  shoulder blade. He had nausea and vomiting through Sunday and subjective  fevers and chills, for which he saw PCP Dr. Caryl Never on Monday who drew  a normal CBC, minimally elevated lipase, and was given antiemetics. When  symtpoms persisted through Tuesday, came to ED.   In the ED, Tmax 102.7, HR max 133, BP min 87/56, although all more  stable appearing at present. Labs with Na 131, renal 22/1.72, glucose  266, AlkP 240, Lipase 91, AST 242/ALT 232, Tbili 5.8. Lactate 3.7. WBC  only 7.4, rest of CBC stable. UA with glucosuria, bilirubinuria but no  infection. BCx pending x2. Abd u/s done first, showed gallstones, but no  signs cholecystitis, but with biliary and pancreatic ductal dilatation  unclear etiology, consider pancreatic head mass vs choledocholithiasis;  and hepatic steatosis vs cirrhosis. CTAP with/without done, showed  suspected gallstones with several stones in the CBD as probable cause of  biliary dilatation; also a 5cm exophytic right kidney lesion called as  probably renal cell ca; a low density lesion in left hepatic lobe that  is nonspecific, recommended MRI liver; mild peripancreatic stranding,  cannot r/o pancreatitis; and a handful of other less acute findings.  Pt was given 2L of NS, 1g Vanco, 3.375 zosyn, dilaudid 2mg , 975 tylenol.  ROS as above,  otherwise with minimal diarrhea, but no cardiopulmonary  symptoms. They didn't notice that his eyes are now yellow. He has a  chronic (years and years) right great toe ulcer from DM that is followed  as outpt and not worse. ROS o/w stable or negative.    Past Medical History  Diagnosis Date  . Allergy   . Diabetes mellitus     Type 2  . GERD (gastroesophageal reflux disease)   . Hyperlipidemia   . Peripheral neuropathy     peripheral neuropathy  . Renal insufficiency   . Diabetic foot ulcers   . Overweight   . Sleep apnea     CPAP  . Pancreatitis 02/2007    ACUTE  . MRSA (methicillin resistant Staphylococcus aureus)   . CAD (coronary artery disease) ~1997    S/p stenting but denies MI    Past Surgical History  Procedure Date  . Coronary angioplasty with stent placement   . Penile prosthesis implant     later removed  . Partial gastrectomy 1973    Medications:  HOME MEDS: Reconciled by name with the pt, and most of the dosages.  Prior to Admission medications   Medication Sig Start Date End Date Taking? Authorizing Provider  ALPRAZolam Prudy Feeler) 0.5 MG tablet Take 0.5 mg by mouth 3 (three) times daily as needed. For anxiety.   Yes Historical Provider, MD  aspirin 325 MG EC tablet Take 325 mg by mouth 2 (two) times daily.     Yes Historical Provider, MD  esomeprazole (NEXIUM) 40 MG capsule Take 40 mg by mouth daily before breakfast.   Yes Historical Provider, MD  fenofibrate 160 MG tablet TAKE ONE TABLET BY MOUTH EVERY DAY 07/11/10  Yes Kristian Covey, MD  gabapentin (NEURONTIN) 800 MG tablet TAKE 1 TABLET BY MOUTH 5 TIMES DAILY 08/17/10  Yes Kristian Covey, MD  insulin NPH-insulin regular (NOVOLIN 70/30) (70-30) 100 UNIT/ML injection Inject 80  units subcutaneously in the morning and 75 units in the evening as directed 02/08/11  Yes Kristian Covey, MD  lisinopril (PRINIVIL,ZESTRIL) 10 MG tablet TAKE ONE TABLET BY MOUTH EVERY DAY 06/15/11  Yes Kristian Covey, MD    metoclopramide (REGLAN) 10 MG tablet Take 10 mg by mouth 4 (four) times daily. One by mouth qid- AC meals and q hs as needed for nausea and vomiting    Yes Historical Provider, MD  ondansetron (ZOFRAN ODT) 8 MG disintegrating tablet Take 1 tablet (8 mg total) by mouth every 8 (eight) hours as needed for nausea. 09/13/11 09/20/11 Yes Kristian Covey, MD  oxiconazole (OXISTAT) 1 % CREA Apply 1 application topically as needed. For jock itch.   Yes Historical Provider, MD  simvastatin (ZOCOR) 40 MG tablet TAKE ONE TABLET BY MOUTH EVERY DAY 06/29/10  Yes Kristian Covey, MD  triamcinolone cream (KENALOG) 0.1 % Apply 1 application topically 2 (two) times daily as needed. For rash on back.   Yes Historical Provider, MD  furosemide (LASIX) 40 MG tablet Take 40 mg by mouth daily as needed. For fluid retention.    Historical Provider, MD  glucose blood test strip 1 each by Other route as needed. Check fasting blood sugar in the morning as directed     Historical Provider, MD  INS SYRINGE/NEEDLE 1CC/29G (RELION INSULIN SYRINGE) 29G X 1/2" 1 ML MISC by Does not apply route. Use as directed two times a day     Historical Provider, MD  Insulin Pen Needle (1ST CHOICE PEN NEEDLES) 31G X 8 MM MISC by Does not apply route.      Historical Provider, MD    Allergies:  Allergies  Allergen Reactions  . Hydrocodone   . Sulfamethoxazole     REACTION: hives    Social History:   reports that he quit smoking about 51 years ago. His smoking use included Cigarettes. He has never used smokeless tobacco. He reports that he does not drink alcohol or use illicit drugs. Lives at home with wife [place]. Has 4 grown children and 13 grandchildren. Doesn't smoke, no drugs, no alcohol.   Family History: Family History  Problem Relation Age of Onset  . Colon cancer Mother 43  . Colon cancer Sister 50  . Colon cancer Brother 78  . Stomach cancer Neg Hx     Physical Exam: Filed Vitals:   09/14/11 2113 09/14/11 2143 09/14/11  2200 09/14/11 2300  BP: 87/56 107/64 104/55 97/62  Pulse: 103 99 100   Temp:      TempSrc:      Resp: 15 15 20 26   SpO2: 99% 97% 98%    Blood pressure 97/62, pulse 100, temperature 102.7 F (39.3 C), temperature source Rectal, resp. rate 26, SpO2 98.00%.  Gen: Very obese M, appears overall fairly well and OK though, not  floridly toxic and obtunded, but awake, alert, pleasant, able to relate  history well  HEENT: Sclera with obvious icterus. Pupils round, irises clear, EOMI, no  nystagmus, mouth moist and normal appearing, dentition fairly good.  Lungs: Bibasilar light inspiratory crackle noted but otherwise good air  movement, no increased WOB, no accessory muscles Heart: Regular, S1/2 clear without m/g or other sounds, no heaves,  normal exam Abd: Obese, moderately distended, but soft not rigid or peritoneal.  Endorses subjective TTP in the RUQ and minimally positive Murphy's sign,  TTP epigastrically as well. Minimal facial grimacing noted. Liver not  overtly enlarged but difficult exam due to obesity Extrem: Warm, radials palpable, no BLE edema, good muscle bulk and  normal tone. R great toe dressed due to chronic ulcer.  Neuro: Alert, attentive, CN 2-12 intact, no slurring or aphasia, speech  normal, moves around bed on his own, uses extremities sponataneously,  grossly nonfocal.     Labs & Imaging Results for orders placed during the hospital encounter of 09/14/11 (from the past 48 hour(s))  URINALYSIS, ROUTINE W REFLEX MICROSCOPIC     Status: Abnormal   Collection Time   09/14/11  1:29 PM      Component Value Range Comment   Color, Urine AMBER (*) YELLOW  BIOCHEMICALS MAY BE AFFECTED BY COLOR   APPearance CLEAR  CLEAR     Specific Gravity, Urine 1.024  1.005 - 1.030     pH 6.5  5.0 - 8.0     Glucose, UA >1000 (*) NEGATIVE (mg/dL)    Hgb urine dipstick NEGATIVE  NEGATIVE     Bilirubin Urine LARGE (*) NEGATIVE     Ketones, ur TRACE (*) NEGATIVE (mg/dL)    Protein, ur  30 (*) NEGATIVE (mg/dL)    Urobilinogen, UA 1.0  0.0 - 1.0 (mg/dL)    Nitrite NEGATIVE  NEGATIVE     Leukocytes, UA NEGATIVE  NEGATIVE    URINE MICROSCOPIC-ADD ON     Status: Normal   Collection Time   09/14/11  1:29 PM      Component Value Range Comment   Squamous Epithelial / LPF RARE  RARE    CBC     Status: Abnormal   Collection Time   09/14/11  3:20 PM      Component Value Range Comment   WBC 7.4  4.0 - 10.5 (K/uL)    RBC 4.86  4.22 - 5.81 (MIL/uL)    Hemoglobin 12.0 (*) 13.0 - 17.0 (g/dL)    HCT 45.4 (*) 09.8 - 52.0 (%)    MCV 75.3 (*) 78.0 - 100.0 (fL)    MCH 24.7 (*) 26.0 - 34.0 (pg)    MCHC 32.8  30.0 - 36.0 (g/dL)    RDW 11.9 (*) 14.7 - 15.5 (%)    Platelets 229  150 - 400 (K/uL)   DIFFERENTIAL     Status: Abnormal   Collection Time   09/14/11  3:20 PM      Component Value Range Comment   Neutrophils Relative 92 (*) 43 - 77 (%)    Neutro Abs 6.9  1.7 - 7.7 (K/uL)    Lymphocytes Relative 4 (*) 12 - 46 (%)    Lymphs Abs 0.3 (*) 0.7 - 4.0 (K/uL)    Monocytes Relative 4  3 - 12 (%)    Monocytes Absolute 0.3  0.1 - 1.0 (K/uL)    Eosinophils Relative 0  0 - 5 (%)    Eosinophils Absolute 0.0  0.0 - 0.7 (K/uL)    Basophils Relative 0  0 - 1 (%)    Basophils Absolute 0.0  0.0 - 0.1 (K/uL)   COMPREHENSIVE METABOLIC PANEL     Status: Abnormal  Collection Time   09/14/11  3:20 PM      Component Value Range Comment   Sodium 131 (*) 135 - 145 (mEq/L)    Potassium 4.3  3.5 - 5.1 (mEq/L)    Chloride 98  96 - 112 (mEq/L)    CO2 20  19 - 32 (mEq/L)    Glucose, Bld 266 (*) 70 - 99 (mg/dL)    BUN 22  6 - 23 (mg/dL)    Creatinine, Ser 1.19 (*) 0.50 - 1.35 (mg/dL)    Calcium 9.0  8.4 - 10.5 (mg/dL)    Total Protein 7.4  6.0 - 8.3 (g/dL)    Albumin 3.4 (*) 3.5 - 5.2 (g/dL)    AST 147 (*) 0 - 37 (U/L)    ALT 232 (*) 0 - 53 (U/L)    Alkaline Phosphatase 240 (*) 39 - 117 (U/L)    Total Bilirubin 5.8 (*) 0.3 - 1.2 (mg/dL)    GFR calc non Af Amer 39 (*) >90 (mL/min)    GFR calc Af  Amer 45 (*) >90 (mL/min)   LIPASE, BLOOD     Status: Abnormal   Collection Time   09/14/11  3:20 PM      Component Value Range Comment   Lipase 91 (*) 11 - 59 (U/L)   LACTIC ACID, PLASMA     Status: Abnormal   Collection Time   09/14/11  5:07 PM      Component Value Range Comment   Lactic Acid, Venous 3.7 (*) 0.5 - 2.2 (mmol/L)    Ct Abdomen Pelvis W Wo Contrast  09/14/2011  *RADIOLOGY REPORT*  Clinical Data: Right upper quadrant abdominal pain.  History gallstones and pancreatitis.  CT ABDOMEN AND PELVIS WITHOUT AND WITH CONTRAST  Technique:  Multidetector CT imaging of the abdomen and pelvis was performed without contrast material in one or both body regions, followed by contrast material(s) and further sections in one or both body regions.  Contrast: 80mL OMNIPAQUE IOHEXOL 300 MG/ML  SOLN  Comparison: 09/14/2011 ultrasound; CT scan from 02/05/2007  Findings: Punctate calcifications in the liver and spleen are probably from old granulomatous disease.  There is mild atelectasis in the lingula, left lower lobe, and posterior basal segment right lower lobe.  Several punctate calcifications present in the pancreatic body and head.  Mild peripancreatic stranding noted, particularly in the pancreatic head region.  Dependent gallstones noted in the gallbladder.  In the common bile duct on image 48 of series 3 and also probably in the vicinity of the ampulla, focal high densities favor choledocholithiasis.  Pancreatic atrophy noted.  Porta hepatis nodes measure up to 10 mm in short axis.  Small peripancreatic lymph nodes are also present, measuring up to about 10 mm in short axis.  There is an indistinct 3.4 x 2.7 cm region of hypodensity inferiorly in the lateral segment left hepatic lobe which is technically nonspecific and visible precontrast and postcontrast images.  This may reflect focal fatty infiltration although underlying lesion is difficult to exclude.  No definite lesion was visible on this position  on the prior exam from 2008.  Left adrenal gland mass measures 1.8 x 1.6 cm, technically nonspecific based on the imaging characteristics.  Atherosclerotic calcification of the abdominal aorta is present.  Abnormal exophytic lesion of the left kidney lower pole is enhancing and suspicious for a renal cell carcinoma.  No tumor thrombus in the left renal vein is observed.  This mass may measure up to 5 cm in diameter.  Lumbar spondylosis and degenerative disc disease noted, with right foraminal stenosis at L5-S1.  The urinary bladder appears unremarkable.  IMPRESSION:  1.  Suspected choledocholithiasis with several stones in the common bile duct as a probable cause for the biliary dilatation. 2.  Exophytic enhancing mass in the right kidney lower pole, approximately 5 cm in diameter, probably a renal cell carcinoma. 3.  Indistinct low density lesion inferiorly in the lateral segment left hepatic lobe may represent fatty infiltration given the geographic appearance, but is essentially nonspecific.  MRI characterization is recommended using dynamic hepatic protocol with feasible. 4.  Nonspecific small left adrenal mass. 5.  Cholelithiasis. 6.  Old granulomatous disease. 7.  Mild findings of chronic pancreatitis.  Pancreatic atrophy. 8.  Mild peripancreatic stranding especially along the pancreatic head - pancreatitis is not excluded.  No acute fluid collections noted. 9.  Atherosclerosis. 10.  Lumbar spondylosis and degenerative disc disease, with right foraminal stenosis at L5-S1.  Original Report Authenticated By: Dellia Cloud, M.D.   US Abdomen Complete  09/14/2011  *RADIOLOGY REPORT*  Clinical Data:  Abdominal pain.  ABDOMINAL ULTRASOUND COMPLETE  Comparison:  None.  Findings:  Gallbladder:  Multiple small less than 1 cm gallstones are seen. No evidence of gallbladder dilatation wall thickening.  Common Bile Duct:  Dilated measuring 9 mm.  Intrahepatic bile ducts also appear dilated in the left hepatic  lobe.  Liver: Coarse increased echotexture, consistent with steatosis or cirrhosis.  No focal liver mass identified.  IVC:  Appears normal.  Pancreas:  Dilated pancreatic duct seen in the pancreatic body measuring 7 mm.  Pancreatic head not visualized.  Spleen:  Within normal limits in size and echotexture.  Right kidney:  Normal in size and parenchymal echogenicity.  No evidence of mass or hydronephrosis.  Left kidney:  Normal in size and parenchymal echogenicity.  No evidence of mass or hydronephrosis.  Abdominal Aorta:  No aneurysm identified.  IMPRESSION:  1.  Cholelithiasis.  No sonographic signs of acute cholecystitis. 2.  Biliary and pancreatic ductal dilatation.  Etiology not visualized by ultrasound.  Pancreatic head mass or choledocholithiasis cannot be excluded.  Consider abdomen CT without and with contrast (pancreatic mass protocol) for further evaluation. 3.  Hepatic steatosis or cirrhosis.  Original Report Authenticated By: Danae Orleans, M.D.    Impression Present on Admission:  .Sepsis .Ascending cholangitis .DM w/o complication type II .DIABETIC FOOT ULCER, TOE .RENAL INSUFFICIENCY, CHRONIC .GALLSTONES  71yoM with h/o DM2 with peripheral neuropathy, CKD baseline Cr 2.0, h/o  pancreatitis, gallstones, and likely biliary colic, now presents with  sepsis from ascending cholangitis.  1. Fever, hypoTN, tachycardia, elevated LFT's, choledocholithiasis with  biliary/pancreatic ductal dilatation: Ascending cholangitis with  evolving sepsis physiology is the unifying diagnosis for this pt. Pt has  Charcot's triad, and also hypoTN but no confusion to complete Reynold's  pentad. Frankly he looks better than his vitals suggested.   - Admit SDU, IVF's resuscitation, continue zosyn, f/u cultures.  - GI consultation for consideration of ERCP/biliary drainage, although  80% of pts will improve with conservative management alone. If pt not  improving at 24 hrs, consider drainage.  -  Holding home lasix, lisinopril - Clear liquid diet as pt requests, tolerated OK so far   2. Renal lesion: concern for renal cell ca. Once pt stabilized, this  should be followed up on. I did discuss this with pt and wife.   3. CKD: Baseline renal fxn appears to be 20-40 / 2.0 so current values  of 22/1.72 are within baseline.   4. DM and current hyperglycemia: Will continue home 70/30 regimen and  also order SSI.  - Continue home gabapentin, statin, fibrate   5. HypoNa: Minimal, expect this to improve with IVF's.   6. H/o CAD s/p remote stenting: No current complaints, continue home ASA  325  7. Holding all other non essential home meds.   SubQ heparin SDU, [facility] team 5 Full code, discussed with pt   Other plans as per orders.   Lealon Vanputten 09/14/2011, 11:14 PM   723am addendum:  I have called and spoken to Dr. Madilyn Fireman with GI, who has agreed to see the pt, much appreciated.

## 2011-09-14 NOTE — ED Notes (Signed)
Patient transported to CT 

## 2011-09-14 NOTE — ED Notes (Signed)
rn attempted to start IV unsuccessful. IV team called

## 2011-09-14 NOTE — ED Notes (Signed)
MD at bedside. Dr. Angus Palms at bedside.

## 2011-09-15 ENCOUNTER — Encounter (HOSPITAL_COMMUNITY)
Admission: EM | Disposition: A | Discharge status: Home / Self Care | Payer: Self-pay | Source: Home / Self Care | Attending: Internal Medicine

## 2011-09-15 DIAGNOSIS — K8050 Calculus of bile duct without cholangitis or cholecystitis without obstruction: Secondary | ICD-10-CM

## 2011-09-15 DIAGNOSIS — K8020 Calculus of gallbladder without cholecystitis without obstruction: Secondary | ICD-10-CM

## 2011-09-15 DIAGNOSIS — E1165 Type 2 diabetes mellitus with hyperglycemia: Secondary | ICD-10-CM

## 2011-09-15 DIAGNOSIS — N189 Chronic kidney disease, unspecified: Secondary | ICD-10-CM

## 2011-09-15 DIAGNOSIS — R1900 Intra-abdominal and pelvic swelling, mass and lump, unspecified site: Secondary | ICD-10-CM

## 2011-09-15 DIAGNOSIS — E118 Type 2 diabetes mellitus with unspecified complications: Secondary | ICD-10-CM

## 2011-09-15 DIAGNOSIS — K8309 Other cholangitis: Secondary | ICD-10-CM

## 2011-09-15 LAB — MAGNESIUM: Magnesium: 1.6 mg/dL (ref 1.5–2.5)

## 2011-09-15 LAB — CBC
Hemoglobin: 9.4 g/dL — ABNORMAL LOW (ref 13.0–17.0)
MCH: 24 pg — ABNORMAL LOW (ref 26.0–34.0)
Platelets: 202 10*3/uL (ref 150–400)
RBC: 3.92 MIL/uL — ABNORMAL LOW (ref 4.22–5.81)
WBC: 10.7 10*3/uL — ABNORMAL HIGH (ref 4.0–10.5)

## 2011-09-15 LAB — COMPREHENSIVE METABOLIC PANEL
AST: 154 U/L — ABNORMAL HIGH (ref 0–37)
BUN: 27 mg/dL — ABNORMAL HIGH (ref 6–23)
CO2: 19 mEq/L (ref 19–32)
Calcium: 7.8 mg/dL — ABNORMAL LOW (ref 8.4–10.5)
Chloride: 103 mEq/L (ref 96–112)
Creatinine, Ser: 2.15 mg/dL — ABNORMAL HIGH (ref 0.50–1.35)
GFR calc Af Amer: 34 mL/min — ABNORMAL LOW (ref >90)
GFR calc non Af Amer: 29 mL/min — ABNORMAL LOW (ref >90)
Glucose, Bld: 103 mg/dL — ABNORMAL HIGH (ref 70–99)
Total Bilirubin: 4.1 mg/dL — ABNORMAL HIGH (ref 0.3–1.2)

## 2011-09-15 LAB — GLUCOSE, CAPILLARY
Glucose-Capillary: 124 mg/dL — ABNORMAL HIGH (ref 70–99)
Glucose-Capillary: 238 mg/dL — ABNORMAL HIGH (ref 70–99)
Glucose-Capillary: 123 mg/dL — ABNORMAL HIGH (ref 70–99)

## 2011-09-15 LAB — LACTIC ACID, PLASMA: Lactic Acid, Venous: 0.9 mmol/L (ref 0.5–2.2)

## 2011-09-15 SURGERY — ERCP, WITH INTERVENTION IF INDICATED
Anesthesia: Moderate Sedation

## 2011-09-15 MED ORDER — ONDANSETRON HCL 4 MG PO TABS: 4.0000 mg | ORAL_TABLET | Freq: Four times a day (QID) | ORAL | Status: DC | PRN

## 2011-09-15 MED ORDER — SODIUM CHLORIDE 0.9 % IJ SOLN
3.0000 mL | Freq: Two times a day (BID) | INTRAMUSCULAR | Status: DC
  Administered 2011-09-15 – 2011-09-18 (×9): 3 mL via INTRAVENOUS
  Administered 2011-09-19: 10 mL via INTRAVENOUS

## 2011-09-15 MED ORDER — SODIUM CHLORIDE 0.9 % IV SOLN
INTRAVENOUS | Status: DC
  Administered 2011-09-15: 15:00:00 via INTRAVENOUS

## 2011-09-15 MED ORDER — ACETAMINOPHEN 325 MG PO TABS
650.0000 mg | ORAL_TABLET | Freq: Four times a day (QID) | ORAL | Status: DC | PRN
  Administered 2011-09-17 (×2): 650 mg via ORAL
  Filled 2011-09-15 (×3): qty 2

## 2011-09-15 MED ORDER — INSULIN ASPART PROT & ASPART (70-30 MIX) 100 UNIT/ML ~~LOC~~ SUSP
75.0000 [IU] | Freq: Every day | SUBCUTANEOUS | Status: DC
  Administered 2011-09-15: 75 [IU] via SUBCUTANEOUS

## 2011-09-15 MED ORDER — INSULIN ASPART PROT & ASPART (70-30 MIX) 100 UNIT/ML ~~LOC~~ SUSP
80.0000 [IU] | Freq: Every day | SUBCUTANEOUS | Status: DC
  Administered 2011-09-18 – 2011-09-20 (×3): 80 [IU] via SUBCUTANEOUS
  Filled 2011-09-15: qty 3

## 2011-09-15 MED ORDER — FENOFIBRATE 160 MG PO TABS
160.0000 mg | ORAL_TABLET | Freq: Every day | ORAL | Status: DC
  Administered 2011-09-15 – 2011-09-21 (×7): 160 mg via ORAL
  Filled 2011-09-15 (×7): qty 1

## 2011-09-15 MED ORDER — INSULIN ASPART 100 UNIT/ML ~~LOC~~ SOLN
0.0000 [IU] | Freq: Three times a day (TID) | SUBCUTANEOUS | Status: DC
  Administered 2011-09-15: 5 [IU] via SUBCUTANEOUS
  Administered 2011-09-16: 2 [IU] via SUBCUTANEOUS

## 2011-09-15 MED ORDER — ASPIRIN EC 325 MG PO TBEC
325.0000 mg | DELAYED_RELEASE_TABLET | Freq: Two times a day (BID) | ORAL | Status: DC
  Administered 2011-09-15 – 2011-09-20 (×13): 325 mg via ORAL
  Filled 2011-09-15 (×15): qty 1

## 2011-09-15 MED ORDER — ONDANSETRON HCL 4 MG/2ML IJ SOLN
4.0000 mg | Freq: Four times a day (QID) | INTRAMUSCULAR | Status: DC | PRN
  Administered 2011-09-15 – 2011-09-20 (×4): 4 mg via INTRAVENOUS
  Filled 2011-09-15 (×4): qty 2

## 2011-09-15 MED ORDER — DOCUSATE SODIUM 100 MG PO CAPS
100.0000 mg | ORAL_CAPSULE | Freq: Two times a day (BID) | ORAL | Status: DC
  Administered 2011-09-15 – 2011-09-20 (×8): 100 mg via ORAL
  Filled 2011-09-15 (×14): qty 1

## 2011-09-15 MED ORDER — ACETAMINOPHEN 650 MG RE SUPP: 650.0000 mg | Freq: Four times a day (QID) | RECTAL | Status: DC | PRN

## 2011-09-15 MED ORDER — INSULIN ASPART PROT & ASPART (70-30 MIX) 100 UNIT/ML ~~LOC~~ SUSP: 75.0000 [IU] | Freq: Two times a day (BID) | SUBCUTANEOUS | Status: DC

## 2011-09-15 MED ORDER — SENNA 8.6 MG PO TABS
1.0000 | ORAL_TABLET | Freq: Two times a day (BID) | ORAL | Status: DC
  Administered 2011-09-15 – 2011-09-19 (×7): 8.6 mg via ORAL
  Filled 2011-09-15 (×9): qty 1

## 2011-09-15 MED ORDER — SODIUM CHLORIDE 0.9 % IV BOLUS (SEPSIS)
2000.0000 mL | Freq: Once | INTRAVENOUS | Status: AC
  Administered 2011-09-15: 2000 mL via INTRAVENOUS

## 2011-09-15 MED ORDER — HEPARIN SODIUM (PORCINE) 5000 UNIT/ML IJ SOLN
5000.0000 [IU] | Freq: Three times a day (TID) | INTRAMUSCULAR | Status: DC
  Administered 2011-09-15 – 2011-09-16 (×5): 5000 [IU] via SUBCUTANEOUS
  Filled 2011-09-15 (×7): qty 1

## 2011-09-15 MED ORDER — SIMVASTATIN 40 MG PO TABS
40.0000 mg | ORAL_TABLET | Freq: Every day | ORAL | Status: DC
  Administered 2011-09-15 – 2011-09-20 (×6): 40 mg via ORAL
  Filled 2011-09-15 (×7): qty 1

## 2011-09-15 MED ORDER — GABAPENTIN 400 MG PO CAPS
800.0000 mg | ORAL_CAPSULE | Freq: Three times a day (TID) | ORAL | Status: DC
  Administered 2011-09-15 – 2011-09-21 (×18): 800 mg via ORAL
  Filled 2011-09-15 (×21): qty 2

## 2011-09-15 MED ORDER — PIPERACILLIN-TAZOBACTAM 3.375 G IVPB
3.3750 g | Freq: Three times a day (TID) | INTRAVENOUS | Status: DC
  Administered 2011-09-15 – 2011-09-20 (×18): 3.375 g via INTRAVENOUS
  Filled 2011-09-15 (×20): qty 50

## 2011-09-15 MED ORDER — POLYETHYLENE GLYCOL 3350 17 G PO PACK
17.0000 g | PACK | Freq: Every day | ORAL | Status: DC | PRN
  Administered 2011-09-19: 17 g via ORAL
  Filled 2011-09-15: qty 1

## 2011-09-15 NOTE — Progress Notes (Signed)
ANTIBIOTIC CONSULT NOTE - INITIAL  Pharmacy Consult for Zosyn Indication: Sepsis secondary to ascending cholangitis  Allergies  Allergen Reactions  . Hydrocodone   . Sulfamethoxazole     REACTION: hives    Patient Measurements:  ht=  185 cm Wt=  103 kg  Vital Signs: Temp: 98.8 F (37.1 C) (05/14 2333) Temp src: Oral (05/14 2333) BP: 117/60 mmHg (05/14 2333) Pulse Rate: 97  (05/14 2333) Intake/Output from previous day: 05/14 0701 - 05/15 0700 In: -  Out: 200 [Urine:200] Intake/Output from this shift: Total I/O In: -  Out: 200 [Urine:200]  Labs:  Basename 09/14/11 1520 09/13/11 1428  WBC 7.4 9.5  HGB 12.0* 10.8*  PLT 229 288.0  LABCREA -- --  CREATININE 1.72* --   The CrCl is unknown because both a height and weight (above a minimum accepted value) are required for this calculation. No results found for this basename: VANCOTROUGH:2,VANCOPEAK:2,VANCORANDOM:2,GENTTROUGH:2,GENTPEAK:2,GENTRANDOM:2,TOBRATROUGH:2,TOBRAPEAK:2,TOBRARND:2,AMIKACINPEAK:2,AMIKACINTROU:2,AMIKACIN:2, in the last 72 hours   Microbiology: No results found for this or any previous visit (from the past 720 hour(s)).  Medical History: Past Medical History  Diagnosis Date  . Allergy   . Diabetes mellitus     Type 2  . GERD (gastroesophageal reflux disease)   . Hyperlipidemia   . Peripheral neuropathy     peripheral neuropathy  . Renal insufficiency   . Diabetic foot ulcers   . Overweight   . Sleep apnea     CPAP  . Pancreatitis 02/2007    ACUTE  . MRSA (methicillin resistant Staphylococcus aureus)   . CAD (coronary artery disease) ~1997    S/p stenting but denies MI    Medications:  Scheduled:    . sodium chloride   Intravenous STAT  . acetaminophen  975 mg Rectal Once  . aspirin  325 mg Oral BID  . docusate sodium  100 mg Oral BID  . fenofibrate  160 mg Oral Daily  . gabapentin  800 mg Oral TID  . heparin  5,000 Units Subcutaneous Q8H  .  HYDROmorphone (DILAUDID) injection  2  mg Intramuscular Once  . insulin aspart  0-15 Units Subcutaneous TID WC  . insulin aspart protamine-insulin aspart  75 Units Subcutaneous Q supper  . insulin aspart protamine-insulin aspart  80 Units Subcutaneous Q breakfast  . piperacillin-tazobactam (ZOSYN)  IV  3.375 g Intravenous Once  . senna  1 tablet Oral BID  . simvastatin  40 mg Oral q1800  . sodium chloride  1,000 mL Intravenous Once  . sodium chloride  1,000 mL Intravenous Once  . sodium chloride  3 mL Intravenous Q12H  . vancomycin  1,000 mg Intravenous Once  . DISCONTD: acetaminophen  975 mg Oral Once  . DISCONTD: insulin aspart protamine-insulin aspart  75-80 Units Subcutaneous BID WC   Infusions:    . sodium chloride     Assessment: 71 yo admitted with sepsis from ascending cholangitis.  Goal of Therapy:  Treat infection  Plan:   Zosyn 3.375 Gm IV q8h.  EI infusion  F/U SCr as needed.  Susanne Greenhouse R 09/15/2011,12:36 AM

## 2011-09-15 NOTE — Progress Notes (Signed)
eLink Physician-Brief Progress Note Patient Name: Frank Ashley DOB: [birth year] MRN: [id number]  Date of Service  09/15/2011   HPI/Events of Note   Sepsis alert in elink due to MAP  53. Reviewed. Definite sepsis but with current MAP 69 and prior lactate 3.7 and current HR 70s and RR 15, no evidence of occult septic shock  eICU Interventions  Will still give 2L fluid bolus due to fact alert showed up Will repeat lactate at 5am   Intervention Category Major Interventions: Sepsis - evaluation and management  [name] 09/15/2011, 2:50 AM

## 2011-09-15 NOTE — Consult Note (Signed)
Patient seen and examined.  If ERCP is unsuccessful then open cholecystectomy with CBDE is the other option.    I have explained the procedure, risks, of  Cholecystectomy and common bile duct exploration.  Risks include but are not limited to bleeding, infection, wound problems, anesthesia, bile leak, injury to intraabdominal organs  His risk would be increased given his three previous operations and the nature of them.

## 2011-09-15 NOTE — Progress Notes (Signed)
PT NOTE  PT eval/order received---unable to evaluate pt due to simultaneous orders for STRICT BEDREST. Please update activity status as appropriate.  Thank you!

## 2011-09-15 NOTE — Consult Note (Signed)
[facility] Consult Note  Referring Provider: No ref. provider found Primary Care Physician:  Kristian Covey, MD, MD Primary Gastroenterologist:  Dr.  Antony Contras Complaint: Abdominal pain nausea and vomiting HPI: Frank Ashley is an 71 y.o. white male  admitted with a several day history of worsening epigastric abdominal pain nausea and vomiting. He was found to have elevated liver function tests with a bilirubin of 5.8, minimally elevated lipase, and abdominal CT scan and ultrasound showing gallstones with suggested common bile duct stones. He had a temperature of 102 initially but has been afebrile since. His white blood cell count is normal. His lactic acid level was elevated at 3.7. The patient states he has had two thirds of his stomach removed age 66 for peptic ulcer disease. Also had a coronary stent placed 6 years ago. He has no cardiopulmonary disease except he does have sleep apnea. He has a baseline creatinine of 2.0 and has type 2 diabetes.  Past Medical History  Diagnosis Date  . Allergy   . Diabetes mellitus     Type 2  . GERD (gastroesophageal reflux disease)   . Hyperlipidemia   . Peripheral neuropathy     peripheral neuropathy  . Renal insufficiency   . Diabetic foot ulcers   . Overweight   . Sleep apnea     CPAP  . Pancreatitis 02/2007    ACUTE  . MRSA (methicillin resistant Staphylococcus aureus)   . CAD (coronary artery disease) ~1997    S/p stenting but denies MI    Past Surgical History  Procedure Date  . Coronary angioplasty with stent placement   . Penile prosthesis implant     later removed  . Partial gastrectomy 1973    Medications Prior to Admission  Medication Sig Dispense Refill  . ALPRAZolam (XANAX) 0.5 MG tablet Take 0.5 mg by mouth 3 (three) times daily as needed. For anxiety.      [name] aspirin 325 MG EC tablet Take 325 mg by mouth 2 (two) times daily.        [name] esomeprazole (NEXIUM) 40 MG capsule Take 40 mg by mouth daily before  breakfast.      . fenofibrate 160 MG tablet TAKE ONE TABLET BY MOUTH EVERY DAY  90 tablet  3  . gabapentin (NEURONTIN) 800 MG tablet TAKE 1 TABLET BY MOUTH 5 TIMES DAILY  450 tablet  3  . insulin NPH-insulin regular (NOVOLIN 70/30) (70-30) 100 UNIT/ML injection Inject 80  units subcutaneously in the morning and 75 units in the evening as directed  10 mL  6  . lisinopril (PRINIVIL,ZESTRIL) 10 MG tablet TAKE ONE TABLET BY MOUTH EVERY DAY  90 tablet  0  . metoclopramide (REGLAN) 10 MG tablet Take 10 mg by mouth 4 (four) times daily. One by mouth qid- AC meals and q hs as needed for nausea and vomiting       . ondansetron (ZOFRAN ODT) 8 MG disintegrating tablet Take 1 tablet (8 mg total) by mouth every 8 (eight) hours as needed for nausea.  15 tablet  0  . oxiconazole (OXISTAT) 1 % CREA Apply 1 application topically as needed. For jock itch.      . simvastatin (ZOCOR) 40 MG tablet TAKE ONE TABLET BY MOUTH EVERY DAY  90 tablet  3  . triamcinolone cream (KENALOG) 0.1 % Apply 1 application topically 2 (two) times daily as needed. For rash on back.      . furosemide (LASIX) 40 MG tablet Take 40  mg by mouth daily as needed. For fluid retention.      [name] glucose blood test strip 1 each by Other route as needed. Check fasting blood sugar in the morning as directed       . INS SYRINGE/NEEDLE 1CC/29G (RELION INSULIN SYRINGE) 29G X 1/2" 1 ML MISC by Does not apply route. Use as directed two times a day       . Insulin Pen Needle (1ST CHOICE PEN NEEDLES) 31G X 8 MM MISC by Does not apply route.          Allergies:  Allergies  Allergen Reactions  . Hydrocodone   . Sulfamethoxazole     REACTION: hives    Family History  Problem Relation Age of Onset  . Colon cancer Mother 36  . Colon cancer Sister 9  . Colon cancer Brother 11  . Stomach cancer Neg Hx     Social History:  reports that he quit smoking about 51 years ago. His smoking use included Cigarettes. He has never used smokeless tobacco. He reports  that he does not drink alcohol or use illicit drugs.  Review of Systems: negative except as above abdomen soft distended with large well-healed surgical scar   Blood pressure 130/68, pulse 88, temperature 97.7 F (36.5 C), temperature source Oral, resp. rate 15, height 6' (1.829 m), weight 126.9 kg (279 lb 12.2 oz), SpO2 100.00%. Head: Normocephalic, without obvious abnormality, atraumatic Neck: no adenopathy, no carotid bruit, no JVD, supple, symmetrical, trachea midline and thyroid not enlarged, symmetric, no tenderness/mass/nodules Resp: clear to auscultation bilaterally Cardio: regular rate and rhythm, S1, S2 normal, no murmur, click, rub or gallop GI: Abdomen soft distended with moderate epigastric tenderness. Well-healed surgical scar. Extremities: extremities normal, atraumatic, no cyanosis or edema  Results for orders placed during the hospital encounter of 09/14/11 (from the past 48 hour(s))  URINALYSIS, ROUTINE W REFLEX MICROSCOPIC     Status: Abnormal   Collection Time   09/14/11  1:29 PM      Component Value Range Comment   Color, Urine AMBER (*) YELLOW  BIOCHEMICALS MAY BE AFFECTED BY COLOR   APPearance CLEAR  CLEAR     Specific Gravity, Urine 1.024  1.005 - 1.030     pH 6.5  5.0 - 8.0     Glucose, UA >1000 (*) NEGATIVE (mg/dL)    Hgb urine dipstick NEGATIVE  NEGATIVE     Bilirubin Urine LARGE (*) NEGATIVE     Ketones, ur TRACE (*) NEGATIVE (mg/dL)    Protein, ur 30 (*) NEGATIVE (mg/dL)    Urobilinogen, UA 1.0  0.0 - 1.0 (mg/dL)    Nitrite NEGATIVE  NEGATIVE     Leukocytes, UA NEGATIVE  NEGATIVE    URINE MICROSCOPIC-ADD ON     Status: Normal   Collection Time   09/14/11  1:29 PM      Component Value Range Comment   Squamous Epithelial / LPF RARE  RARE    CBC     Status: Abnormal   Collection Time   09/14/11  3:20 PM      Component Value Range Comment   WBC 7.4  4.0 - 10.5 (K/uL)    RBC 4.86  4.22 - 5.81 (MIL/uL)    Hemoglobin 12.0 (*) 13.0 - 17.0 (g/dL)    HCT  16.1 (*) 09.6 - 52.0 (%)    MCV 75.3 (*) 78.0 - 100.0 (fL)    MCH 24.7 (*) 26.0 - 34.0 (pg)    MCHC 32.8  30.0 - 36.0 (g/dL)    RDW 16.1 (*) 09.6 - 15.5 (%)    Platelets 229  150 - 400 (K/uL)   DIFFERENTIAL     Status: Abnormal   Collection Time   09/14/11  3:20 PM      Component Value Range Comment   Neutrophils Relative 92 (*) 43 - 77 (%)    Neutro Abs 6.9  1.7 - 7.7 (K/uL)    Lymphocytes Relative 4 (*) 12 - 46 (%)    Lymphs Abs 0.3 (*) 0.7 - 4.0 (K/uL)    Monocytes Relative 4  3 - 12 (%)    Monocytes Absolute 0.3  0.1 - 1.0 (K/uL)    Eosinophils Relative 0  0 - 5 (%)    Eosinophils Absolute 0.0  0.0 - 0.7 (K/uL)    Basophils Relative 0  0 - 1 (%)    Basophils Absolute 0.0  0.0 - 0.1 (K/uL)   COMPREHENSIVE METABOLIC PANEL     Status: Abnormal   Collection Time   09/14/11  3:20 PM      Component Value Range Comment   Sodium 131 (*) 135 - 145 (mEq/L)    Potassium 4.3  3.5 - 5.1 (mEq/L)    Chloride 98  96 - 112 (mEq/L)    CO2 20  19 - 32 (mEq/L)    Glucose, Bld 266 (*) 70 - 99 (mg/dL)    BUN 22  6 - 23 (mg/dL)    Creatinine, Ser 0.45 (*) 0.50 - 1.35 (mg/dL)    Calcium 9.0  8.4 - 10.5 (mg/dL)    Total Protein 7.4  6.0 - 8.3 (g/dL)    Albumin 3.4 (*) 3.5 - 5.2 (g/dL)    AST 409 (*) 0 - 37 (U/L)    ALT 232 (*) 0 - 53 (U/L)    Alkaline Phosphatase 240 (*) 39 - 117 (U/L)    Total Bilirubin 5.8 (*) 0.3 - 1.2 (mg/dL)    GFR calc non Af Amer 39 (*) >90 (mL/min)    GFR calc Af Amer 45 (*) >90 (mL/min)   LIPASE, BLOOD     Status: Abnormal   Collection Time   09/14/11  3:20 PM      Component Value Range Comment   Lipase 91 (*) 11 - 59 (U/L)   LACTIC ACID, PLASMA     Status: Abnormal   Collection Time   09/14/11  5:07 PM      Component Value Range Comment   Lactic Acid, Venous 3.7 (*) 0.5 - 2.2 (mmol/L)   MRSA PCR SCREENING     Status: Normal   Collection Time   09/15/11 12:10 AM      Component Value Range Comment   MRSA by PCR NEGATIVE  NEGATIVE    COMPREHENSIVE METABOLIC PANEL      Status: Abnormal   Collection Time   09/15/11  3:54 AM      Component Value Range Comment   Sodium 133 (*) 135 - 145 (mEq/L)    Potassium 4.0  3.5 - 5.1 (mEq/L)    Chloride 103  96 - 112 (mEq/L)    CO2 19  19 - 32 (mEq/L)    Glucose, Bld 103 (*) 70 - 99 (mg/dL)    BUN 27 (*) 6 - 23 (mg/dL)    Creatinine, Ser 8.11 (*) 0.50 - 1.35 (mg/dL)    Calcium 7.8 (*) 8.4 - 10.5 (mg/dL)    Total Protein 5.6 (*) 6.0 - 8.3 (g/dL)  Albumin 2.5 (*) 3.5 - 5.2 (g/dL)    AST 865 (*) 0 - 37 (U/L)    ALT 162 (*) 0 - 53 (U/L)    Alkaline Phosphatase 188 (*) 39 - 117 (U/L)    Total Bilirubin 4.1 (*) 0.3 - 1.2 (mg/dL)    GFR calc non Af Amer 29 (*) >90 (mL/min)    GFR calc Af Amer 34 (*) >90 (mL/min)   CBC     Status: Abnormal   Collection Time   09/15/11  3:54 AM      Component Value Range Comment   WBC 10.7 (*) 4.0 - 10.5 (K/uL)    RBC 3.92 (*) 4.22 - 5.81 (MIL/uL)    Hemoglobin 9.4 (*) 13.0 - 17.0 (g/dL)    HCT 78.4 (*) 69.6 - 52.0 (%)    MCV 74.7 (*) 78.0 - 100.0 (fL)    MCH 24.0 (*) 26.0 - 34.0 (pg)    MCHC 32.1  30.0 - 36.0 (g/dL)    RDW 29.5 (*) 28.4 - 15.5 (%)    Platelets 202  150 - 400 (K/uL)   MAGNESIUM     Status: Normal   Collection Time   09/15/11  3:54 AM      Component Value Range Comment   Magnesium 1.6  1.5 - 2.5 (mg/dL)   PHOSPHORUS     Status: Normal   Collection Time   09/15/11  3:54 AM      Component Value Range Comment   Phosphorus 2.5  2.3 - 4.6 (mg/dL)   LACTIC ACID, PLASMA     Status: Normal   Collection Time   09/15/11  5:11 AM      Component Value Range Comment   Lactic Acid, Venous 0.9  0.5 - 2.2 (mmol/L)   GLUCOSE, CAPILLARY     Status: Abnormal   Collection Time   09/15/11  8:05 AM      Component Value Range Comment   Glucose-Capillary 124 (*) 70 - 99 (mg/dL)    Comment 1 Documented in Chart      Comment 2 Notify RN      Ct Abdomen Pelvis W Wo Contrast  09/14/2011  *RADIOLOGY REPORT*  Clinical Data: Right upper quadrant abdominal pain.  History gallstones  and pancreatitis.  CT ABDOMEN AND PELVIS WITHOUT AND WITH CONTRAST  Technique:  Multidetector CT imaging of the abdomen and pelvis was performed without contrast material in one or both body regions, followed by contrast material(s) and further sections in one or both body regions.  Contrast: 80mL OMNIPAQUE IOHEXOL 300 MG/ML  SOLN  Comparison: 09/14/2011 ultrasound; CT scan from 02/05/2007  Findings: Punctate calcifications in the liver and spleen are probably from old granulomatous disease.  There is mild atelectasis in the lingula, left lower lobe, and posterior basal segment right lower lobe.  Several punctate calcifications present in the pancreatic body and head.  Mild peripancreatic stranding noted, particularly in the pancreatic head region.  Dependent gallstones noted in the gallbladder.  In the common bile duct on image 48 of series 3 and also probably in the vicinity of the ampulla, focal high densities favor choledocholithiasis.  Pancreatic atrophy noted.  Porta hepatis nodes measure up to 10 mm in short axis.  Small peripancreatic lymph nodes are also present, measuring up to about 10 mm in short axis.  There is an indistinct 3.4 x 2.7 cm region of hypodensity inferiorly in the lateral segment left hepatic lobe which is technically nonspecific and visible precontrast and postcontrast  images.  This may reflect focal fatty infiltration although underlying lesion is difficult to exclude.  No definite lesion was visible on this position on the prior exam from 2008.  Left adrenal gland mass measures 1.8 x 1.6 cm, technically nonspecific based on the imaging characteristics.  Atherosclerotic calcification of the abdominal aorta is present.  Abnormal exophytic lesion of the left kidney lower pole is enhancing and suspicious for a renal cell carcinoma.  No tumor thrombus in the left renal vein is observed.  This mass may measure up to 5 cm in diameter.  Lumbar spondylosis and degenerative disc disease noted,  with right foraminal stenosis at L5-S1.  The urinary bladder appears unremarkable.  IMPRESSION:  1.  Suspected choledocholithiasis with several stones in the common bile duct as a probable cause for the biliary dilatation. 2.  Exophytic enhancing mass in the right kidney lower pole, approximately 5 cm in diameter, probably a renal cell carcinoma. 3.  Indistinct low density lesion inferiorly in the lateral segment left hepatic lobe may represent fatty infiltration given the geographic appearance, but is essentially nonspecific.  MRI characterization is recommended using dynamic hepatic protocol with feasible. 4.  Nonspecific small left adrenal mass. 5.  Cholelithiasis. 6.  Old granulomatous disease. 7.  Mild findings of chronic pancreatitis.  Pancreatic atrophy. 8.  Mild peripancreatic stranding especially along the pancreatic head - pancreatitis is not excluded.  No acute fluid collections noted. 9.  Atherosclerosis. 10.  Lumbar spondylosis and degenerative disc disease, with right foraminal stenosis at L5-S1.  Original Report Authenticated By: Dellia Cloud, M.D.   US Abdomen Complete  09/14/2011  *RADIOLOGY REPORT*  Clinical Data:  Abdominal pain.  ABDOMINAL ULTRASOUND COMPLETE  Comparison:  None.  Findings:  Gallbladder:  Multiple small less than 1 cm gallstones are seen. No evidence of gallbladder dilatation wall thickening.  Common Bile Duct:  Dilated measuring 9 mm.  Intrahepatic bile ducts also appear dilated in the left hepatic lobe.  Liver: Coarse increased echotexture, consistent with steatosis or cirrhosis.  No focal liver mass identified.  IVC:  Appears normal.  Pancreas:  Dilated pancreatic duct seen in the pancreatic body measuring 7 mm.  Pancreatic head not visualized.  Spleen:  Within normal limits in size and echotexture.  Right kidney:  Normal in size and parenchymal echogenicity.  No evidence of mass or hydronephrosis.  Left kidney:  Normal in size and parenchymal echogenicity.  No  evidence of mass or hydronephrosis.  Abdominal Aorta:  No aneurysm identified.  IMPRESSION:  1.  Cholelithiasis.  No sonographic signs of acute cholecystitis. 2.  Biliary and pancreatic ductal dilatation.  Etiology not visualized by ultrasound.  Pancreatic head mass or choledocholithiasis cannot be excluded.  Consider abdomen CT without and with contrast (pancreatic mass protocol) for further evaluation. 3.  Hepatic steatosis or cirrhosis.  Original Report Authenticated By: Danae Orleans, M.D.    Assessment: Choledocholithiasis, cholelithiasis and possible cholangitis. Plan:  Initial plan was to do ERCP today but was unable to obtain anesthesia assistance. 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

## 2011-09-15 NOTE — Progress Notes (Signed)
Subjective: Pt with moderate pain throughout the day today which is well controlled by his current pain regimen. He is anxious about the planned ERCP tomorrow. His wife is present and she states that she has spoken with Dr. Madilyn Fireman and she understands the procedure.   Objective: Filed Vitals:   09/15/11 0023 09/15/11 0400 09/15/11 0800 09/15/11 1200  BP:  130/68    Pulse:  82 88   Temp:  97.9 F (36.6 C) 97.7 F (36.5 C) 98.4 F (36.9 C)  TempSrc:  Oral Oral Oral  Resp:  14 15   Height:      Weight:      SpO2: 96% 100% 100%    Weight change:   Intake/Output Summary (Last 24 hours) at 09/15/11 1653 Last data filed at 09/15/11 1030  Gross per 24 hour  Intake    240 ml  Output   1150 ml  Net   -910 ml    General: Alert, awake, oriented x3, in no acute distress.  HEENT: [place]/AT PEERL, EOMI Neck: Trachea midline,  no masses, no thyromegal,y no JVD, no carotid bruit OROPHARYNX:  Moist, No exudate/ erythema/lesions.  Heart: Regular rate and rhythm, without murmurs, rubs, gallops, PMI non-displaced, no heaves or thrills on palpation.  Lungs: Clear to auscultation, no wheezing or rhonchi noted. No increased vocal fremitus resonant to percussion  Abdomen: Soft, mild epigastric tenderness,  Mildly distended, positive bowel sounds, no masses no hepatosplenomegaly noted..  Neuro: No focal neurological deficits noted cranial nerves II through XII grossly intact. DTRs 2+ bilaterally upper and lower extremities. Strength 5/5 in bilateral upper and lower extremities. Musculoskeletal: No warm swelling or erythema around joints, no spinal tenderness noted. Skin: Patient has a clean wound on the plantar surface of the first toe. There is no drainage from the wound. There is no induration, erythema or fluctuance noted.    Lab Results:  Basename 09/15/11 0354 09/14/11 1520  NA 133* 131*  K 4.0 4.3  CL 103 98  CO2 19 20  GLUCOSE 103* 266*  BUN 27* 22  CREATININE 2.15* 1.72*  CALCIUM 7.8* 9.0   MG 1.6 --  PHOS 2.5 --    Basename 09/15/11 0354 09/14/11 1520  AST 154* 242*  ALT 162* 232*  ALKPHOS 188* 240*  BILITOT 4.1* 5.8*  PROT 5.6* 7.4  ALBUMIN 2.5* 3.4*    Basename 09/14/11 1520 09/13/11 1428  LIPASE 91* 64.0*  AMYLASE -- 70    Basename 09/15/11 0354 09/14/11 1520 09/13/11 1428  WBC 10.7* 7.4 --  NEUTROABS -- 6.9 7.1  HGB 9.4* 12.0* --  HCT 29.3* 36.6* --  MCV 74.7* 75.3* --  PLT 202 229 --   No results found for this basename: CKTOTAL:3,CKMB:3,CKMBINDEX:3,TROPONINI:3 in the last 72 hours No components found with this basename: POCBNP:3 No results found for this basename: DDIMER:2 in the last 72 hours No results found for this basename: HGBA1C:2 in the last 72 hours No results found for this basename: CHOL:2,HDL:2,LDLCALC:2,TRIG:2,CHOLHDL:2,LDLDIRECT:2 in the last 72 hours No results found for this basename: TSH,T4TOTAL,FREET3,T3FREE,THYROIDAB in the last 72 hours No results found for this basename: VITAMINB12:2,FOLATE:2,FERRITIN:2,TIBC:2,IRON:2,RETICCTPCT:2 in the last 72 hours  Micro Results: Recent Results (from the past 240 hour(s))  MRSA PCR SCREENING     Status: Normal   Collection Time   09/15/11 12:10 AM      Component Value Range Status Comment   MRSA by PCR NEGATIVE  NEGATIVE  Final     Studies/Results: Ct Abdomen Pelvis W Wo Contrast  09/14/2011  *RADIOLOGY REPORT*  Clinical Data: Right upper quadrant abdominal pain.  History gallstones and pancreatitis.  CT ABDOMEN AND PELVIS WITHOUT AND WITH CONTRAST  Technique:  Multidetector CT imaging of the abdomen and pelvis was performed without contrast material in one or both body regions, followed by contrast material(s) and further sections in one or both body regions.  Contrast: 80mL OMNIPAQUE IOHEXOL 300 MG/ML  SOLN  Comparison: 09/14/2011 ultrasound; CT scan from 02/05/2007  Findings: Punctate calcifications in the liver and spleen are probably from old granulomatous disease.  There is mild  atelectasis in the lingula, left lower lobe, and posterior basal segment right lower lobe.  Several punctate calcifications present in the pancreatic body and head.  Mild peripancreatic stranding noted, particularly in the pancreatic head region.  Dependent gallstones noted in the gallbladder.  In the common bile duct on image 48 of series 3 and also probably in the vicinity of the ampulla, focal high densities favor choledocholithiasis.  Pancreatic atrophy noted.  Porta hepatis nodes measure up to 10 mm in short axis.  Small peripancreatic lymph nodes are also present, measuring up to about 10 mm in short axis.  There is an indistinct 3.4 x 2.7 cm region of hypodensity inferiorly in the lateral segment left hepatic lobe which is technically nonspecific and visible precontrast and postcontrast images.  This may reflect focal fatty infiltration although underlying lesion is difficult to exclude.  No definite lesion was visible on this position on the prior exam from 2008.  Left adrenal gland mass measures 1.8 x 1.6 cm, technically nonspecific based on the imaging characteristics.  Atherosclerotic calcification of the abdominal aorta is present.  Abnormal exophytic lesion of the left kidney lower pole is enhancing and suspicious for a renal cell carcinoma.  No tumor thrombus in the left renal vein is observed.  This mass may measure up to 5 cm in diameter.  Lumbar spondylosis and degenerative disc disease noted, with right foraminal stenosis at L5-S1.  The urinary bladder appears unremarkable.  IMPRESSION:  1.  Suspected choledocholithiasis with several stones in the common bile duct as a probable cause for the biliary dilatation. 2.  Exophytic enhancing mass in the right kidney lower pole, approximately 5 cm in diameter, probably a renal cell carcinoma. 3.  Indistinct low density lesion inferiorly in the lateral segment left hepatic lobe may represent fatty infiltration given the geographic appearance, but is  essentially nonspecific.  MRI characterization is recommended using dynamic hepatic protocol with feasible. 4.  Nonspecific small left adrenal mass. 5.  Cholelithiasis. 6.  Old granulomatous disease. 7.  Mild findings of chronic pancreatitis.  Pancreatic atrophy. 8.  Mild peripancreatic stranding especially along the pancreatic head - pancreatitis is not excluded.  No acute fluid collections noted. 9.  Atherosclerosis. 10.  Lumbar spondylosis and degenerative disc disease, with right foraminal stenosis at L5-S1.  Original Report Authenticated By: Dellia Cloud, M.D.   US Abdomen Complete  09/14/2011  *RADIOLOGY REPORT*  Clinical Data:  Abdominal pain.  ABDOMINAL ULTRASOUND COMPLETE  Comparison:  None.  Findings:  Gallbladder:  Multiple small less than 1 cm gallstones are seen. No evidence of gallbladder dilatation wall thickening.  Common Bile Duct:  Dilated measuring 9 mm.  Intrahepatic bile ducts also appear dilated in the left hepatic lobe.  Liver: Coarse increased echotexture, consistent with steatosis or cirrhosis.  No focal liver mass identified.  IVC:  Appears normal.  Pancreas:  Dilated pancreatic duct seen in the pancreatic body measuring 7  mm.  Pancreatic head not visualized.  Spleen:  Within normal limits in size and echotexture.  Right kidney:  Normal in size and parenchymal echogenicity.  No evidence of mass or hydronephrosis.  Left kidney:  Normal in size and parenchymal echogenicity.  No evidence of mass or hydronephrosis.  Abdominal Aorta:  No aneurysm identified.  IMPRESSION:  1.  Cholelithiasis.  No sonographic signs of acute cholecystitis. 2.  Biliary and pancreatic ductal dilatation.  Etiology not visualized by ultrasound.  Pancreatic head mass or choledocholithiasis cannot be excluded.  Consider abdomen CT without and with contrast (pancreatic mass protocol) for further evaluation. 3.  Hepatic steatosis or cirrhosis.  Original Report Authenticated By: Danae Orleans, M.D.     Medications: I have reviewed the patient's current medications. Scheduled Meds:   . sodium chloride   Intravenous STAT  . aspirin  325 mg Oral BID  . docusate sodium  100 mg Oral BID  . fenofibrate  160 mg Oral Daily  . gabapentin  800 mg Oral TID  . heparin  5,000 Units Subcutaneous Q8H  . insulin aspart  0-15 Units Subcutaneous TID WC  . insulin aspart protamine-insulin aspart  75 Units Subcutaneous Q supper  . insulin aspart protamine-insulin aspart  80 Units Subcutaneous Q breakfast  . piperacillin-tazobactam (ZOSYN)  IV  3.375 g Intravenous Once  . piperacillin-tazobactam (ZOSYN)  IV  3.375 g Intravenous Q8H  . senna  1 tablet Oral BID  . simvastatin  40 mg Oral q1800  . sodium chloride  1,000 mL Intravenous Once  . sodium chloride  2,000 mL Intravenous Once  . sodium chloride  3 mL Intravenous Q12H  . vancomycin  1,000 mg Intravenous Once  . DISCONTD: insulin aspart protamine-insulin aspart  75-80 Units Subcutaneous BID WC   Continuous Infusions:   . sodium chloride 150 mL/hr at 09/15/11 1447   PRN Meds:.acetaminophen, acetaminophen, HYDROmorphone (DILAUDID) injection, iohexol, ondansetron (ZOFRAN) IV, ondansetron, polyethylene glycol, DISCONTD: ondansetron (ZOFRAN) IV Assessment/Plan: Patient Active Hospital Problem List: Sepsis (09/14/2011)   Assessment: Pt not exhibiting sepsis physiology at present. Has responded well to hydration and antibiotics.    Plan: Continue Zosyn and IV hydration.  DM w/o complication type II (07/24/2008)   Assessment: BS controlled at present but will check Hb A1c for better assessment of BS control   DIABETIC FOOT ULCER, TOE (08/01/2008)   Assessment: The ulcer appears to be healing well. Her last wound ostomy care to look at it and make recommendations. In the meantime I will continue the dressing with iodoform gauze to the wound covered with a dry dressing.     GALLSTONES (03/23/2010)   Assessment: Pt with choledocholithiasis and  ascending cholangitis.     RENAL INSUFFICIENCY, CHRONIC (07/24/2008)   Assessment: Patient has chronic renal insufficiency his baseline creatinine is unknown he'll contact his primary care physician to obtain those records to    Ascending cholangitis (09/14/2011)   Assessment: For ERCP tomorrow     LOS: 1 day

## 2011-09-15 NOTE — Progress Notes (Signed)
Pt received from ED. Wife at bedside: Hale Chalfin [phone], home# [phone]. Pt oriented to room and hospital protocol. No complaints of pain at this time. CHG and MRSA PCR completed. Will continue to monitor patient per M.D. Orders and 2W protocol.

## 2011-09-15 NOTE — Progress Notes (Signed)
CARE MANAGEMENT NOTE 09/15/2011  Patient:  Mantey,Garvis P   Account Number:  [id number]  Date Initiated:  09/15/2011  Documentation initiated by:  Jesslynn Kruck  Subjective/Objective Assessment:   pt with sepsis from gall stones and obstruction,pancreatitis     Action/Plan:   lives at home   Anticipated DC Date:  09/18/2011   Anticipated DC Plan:  HOME/SELF CARE  In-house referral  NA      DC Planning Services  NA      [facility] Choice  NA   Choice offered to / List presented to:  NA   DME arranged  NA      DME agency  NA     HH arranged  NA      HH agency  NA   Status of service:  In process, will continue to follow Medicare Important Message given?  NA - LOS <3 / Initial given by admissions (If response is "NO", the following Medicare IM given date fields will be blank) Date Medicare IM given:   Date Additional Medicare IM given:    Discharge Disposition:    Per UR Regulation:  Reviewed for med. necessity/level of care/duration of stay  If discussed at Long Length of Stay Meetings, dates discussed:    Comments:  [id number]/Lazarius Rivkin Earlene Plater, RN, BSN, CCM No discharge needs present at time of this review at the sdu/icu level. Case Management [phone]

## 2011-09-15 NOTE — Consult Note (Signed)
Reason for Consult: Cholecystitis, choledocholithiasis, and possible cholangitis. Referring Physician: Dr. Dorena Cookey  Frank Ashley is an 71 y.o. male.  HPI: Patient is a 71 year old gentleman with a history of pancreatitis about 2-3 years ago. He resolved had no problems until last week 09/08/2011 he had an episode of discomfort after eating. It lasted a short time and resolved. On Mother's Day 09/12/2011 he ate an the early lunch, and started having pain around 6 PM that night. This pain is worst pain he has had so far it persisted he developed some nausea and vomiting with it. He had some medicine for nausea and took that overnight. He saw Dr. Caryl Never the next day lab showed a slightly elevated lipase were otherwise normal. After going home he had continuous pain that was severe he could not sleep. He presented to the emergency room on Tuesday, 09/14/2010, he had a temperature 102.7 he was tachycardic with a heart rate of 133 a blood pressure 87/56. LFTs were elevated lipase was 91, lactate was 3.7, bilirubin was 5.8. Ultrasound showed multiple gallstones less than 1 cm. Common bile duct was dilated at 9 mm intrahepatic ducts were dilated in the left lobe. Pancreatic duct was dilated measuring 7 mm. CT scan shows mild peripancreatic stranding particularly in the head region. Dependent gallstones in the gallbladder. Choledocholithiasis in the vicinity of the ampulla. Pancreatic atrophy, porta hepatis nodes measured 10 mm small peripancreatic lymph nodes are also present. All consistent with choledocholithiasis with several stones the common bile duct and probable cause of the biliary dilatation. There is a enhancing mass in the right lower pole kidney question of a renal cell carcinoma. He was seen in consultation by Dr. Dorena Cookey who plan to do an ERCP today. Unfortunately anesthesia was on available. Patient has a history of sleep apnea and a very large neck. In addition he has a history of was  somewhere perforated ulcer when he was 39 which was repaired with a simple closure per history available, about 1961. He then has a history of a partial gastrectomy with no other details of the surgery about 1970. Dr. Madilyn Fireman asked to see the patient in consultation and be available as needed.  Past Medical History  Diagnosis Date  . Allergy   . Diabetes mellitus     Type 2  . GERD (gastroesophageal reflux disease)   . Hyperlipidemia   . Peripheral neuropathy     peripheral neuropathy  . Renal insufficiency   . Diabetic foot ulcers   . Overweight   . Sleep apnea     CPAP  . Pancreatitis 02/2007    ACUTE  . MRSA (methicillin resistant Staphylococcus aureus)   . CAD (coronary artery disease) ~1997    S/p stenting but denies MI Followed in the past by [facility],  He has done well and has not been seen nor had a stress test in some years.    Past Surgical History  Procedure Date  . Coronary angioplasty with stent placement   . Penile prosthesis implant     later removed  . Partial gastrectomy Gastric perforation age 23  Repaired in [place] 1973    Family History  Problem Relation Age of Onset  . Colon cancer Mother 33  . Colon cancer Sister 40  . Colon cancer Brother 12  . Stomach cancer Neg Hx     Social History:  reports that he quit smoking about 51 years ago Less than 10 years use. His smoking use  included Cigarettes. He has never used smokeless tobacco. He reports that he does not drink alcohol or use illicit drugs. 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  Allergies:  Allergies  Allergen Reactions  . Hydrocodone   . Sulfamethoxazole     REACTION: hives    Medications:  Prior to Admission:  Prescriptions prior to admission  Medication Sig Dispense Refill  . ALPRAZolam (XANAX) 0.5 MG tablet Take 0.5 mg by mouth 3 (three) times daily as needed. For anxiety.      [name] aspirin 325 MG EC tablet Take 325 mg by mouth 2 (two) times  daily.        [name] esomeprazole (NEXIUM) 40 MG capsule Take 40 mg by mouth daily before breakfast.      . fenofibrate 160 MG tablet TAKE ONE TABLET BY MOUTH EVERY DAY  90 tablet  3  . gabapentin (NEURONTIN) 800 MG tablet TAKE 1 TABLET BY MOUTH 5 TIMES DAILY  450 tablet  3  . insulin NPH-insulin regular (NOVOLIN 70/30) (70-30) 100 UNIT/ML injection Inject 80  units subcutaneously in the morning and 75 units in the evening as directed  10 mL  6  . lisinopril (PRINIVIL,ZESTRIL) 10 MG tablet TAKE ONE TABLET BY MOUTH EVERY DAY  90 tablet  0  . metoclopramide (REGLAN) 10 MG tablet Take 10 mg by mouth 4 (four) times daily. One by mouth qid- AC meals and q hs as needed for nausea and vomiting       . ondansetron (ZOFRAN ODT) 8 MG disintegrating tablet Take 1 tablet (8 mg total) by mouth every 8 (eight) hours as needed for nausea.  15 tablet  0  . oxiconazole (OXISTAT) 1 % CREA Apply 1 application topically as needed. For jock itch.      . simvastatin (ZOCOR) 40 MG tablet TAKE ONE TABLET BY MOUTH EVERY DAY  90 tablet  3  . triamcinolone cream (KENALOG) 0.1 % Apply 1 application topically 2 (two) times daily as needed. For rash on back.      . furosemide (LASIX) 40 MG tablet Take 40 mg by mouth daily as needed. For fluid retention.      [name] glucose blood test strip 1 each by Other route as needed. Check fasting blood sugar in the morning as directed       . INS SYRINGE/NEEDLE 1CC/29G (RELION INSULIN SYRINGE) 29G X 1/2" 1 ML MISC by Does not apply route. Use as directed two times a day       . Insulin Pen Needle (1ST CHOICE PEN NEEDLES) 31G X 8 MM MISC by Does not apply route.         Scheduled:   . sodium chloride   Intravenous STAT  . aspirin  325 mg Oral BID  . docusate sodium  100 mg Oral BID  . fenofibrate  160 mg Oral Daily  . gabapentin  800 mg Oral TID  . heparin  5,000 Units Subcutaneous Q8H  . insulin aspart  0-15 Units Subcutaneous TID WC  . insulin aspart protamine-insulin aspart  75 Units  Subcutaneous Q supper  . insulin aspart protamine-insulin aspart  80 Units Subcutaneous Q breakfast  . piperacillin-tazobactam (ZOSYN)  IV  3.375 g Intravenous Once  . piperacillin-tazobactam (ZOSYN)  IV  3.375 g Intravenous Q8H  . senna  1 tablet Oral BID  . simvastatin  40 mg Oral q1800  . sodium chloride  1,000 mL Intravenous Once  . sodium chloride  2,000 mL Intravenous Once  . sodium chloride  3 mL Intravenous Q12H  . vancomycin  1,000 mg Intravenous Once  . DISCONTD: insulin aspart protamine-insulin aspart  75-80 Units Subcutaneous BID WC   Continuous:   . sodium chloride 150 mL/hr at 09/15/11 1447   ZOX:[id number], acetaminophen, HYDROmorphone (DILAUDID) injection, iohexol, ondansetron (ZOFRAN) IV, ondansetron, polyethylene glycol, DISCONTD: ondansetron (ZOFRAN) IV Anti-infectives     Start     Dose/Rate Route Frequency Ordered Stop   09/15/11 0600   piperacillin-tazobactam (ZOSYN) IVPB 3.375 g        3.375 g 12.5 mL/hr over 240 Minutes Intravenous 3 times per day 09/15/11 0041     09/14/11 1830   vancomycin (VANCOCIN) IVPB 1000 mg/200 mL premix        1,000 mg 200 mL/hr over 60 Minutes Intravenous  Once 09/14/11 1807 09/14/11 2002   09/14/11 1830   piperacillin-tazobactam (ZOSYN) IVPB 3.375 g        3.375 g 12.5 mL/hr over 240 Minutes Intravenous  Once 09/14/11 1807 09/15/11 0112          Results for orders placed during the hospital encounter of 09/14/11 (from the past 48 hour(s))  URINALYSIS, ROUTINE W REFLEX MICROSCOPIC     Status: Abnormal   Collection Time   09/14/11  1:29 PM      Component Value Range Comment   Color, Urine AMBER (*) YELLOW  BIOCHEMICALS MAY BE AFFECTED BY COLOR   APPearance CLEAR  CLEAR     Specific Gravity, Urine 1.024  1.005 - 1.030     pH 6.5  5.0 - 8.0     Glucose, UA >1000 (*) NEGATIVE (mg/dL)    Hgb urine dipstick NEGATIVE  NEGATIVE     Bilirubin Urine LARGE (*) NEGATIVE     Ketones, ur TRACE (*) NEGATIVE (mg/dL)    Protein, ur  30 (*) NEGATIVE (mg/dL)    Urobilinogen, UA 1.0  0.0 - 1.0 (mg/dL)    Nitrite NEGATIVE  NEGATIVE     Leukocytes, UA NEGATIVE  NEGATIVE    URINE MICROSCOPIC-ADD ON     Status: Normal   Collection Time   09/14/11  1:29 PM      Component Value Range Comment   Squamous Epithelial / LPF RARE  RARE    CBC     Status: Abnormal   Collection Time   09/14/11  3:20 PM      Component Value Range Comment   WBC 7.4  4.0 - 10.5 (K/uL)    RBC 4.86  4.22 - 5.81 (MIL/uL)    Hemoglobin 12.0 (*) 13.0 - 17.0 (g/dL)    HCT 14.7 (*) 82.9 - 52.0 (%)    MCV 75.3 (*) 78.0 - 100.0 (fL)    MCH 24.7 (*) 26.0 - 34.0 (pg)    MCHC 32.8  30.0 - 36.0 (g/dL)    RDW 56.2 (*) 13.0 - 15.5 (%)    Platelets 229  150 - 400 (K/uL)   DIFFERENTIAL     Status: Abnormal   Collection Time   09/14/11  3:20 PM      Component Value Range Comment   Neutrophils Relative 92 (*) 43 - 77 (%)    Neutro Abs 6.9  1.7 - 7.7 (K/uL)    Lymphocytes Relative 4 (*) 12 - 46 (%)    Lymphs Abs 0.3 (*) 0.7 - 4.0 (K/uL)    Monocytes Relative 4  3 - 12 (%)    Monocytes Absolute 0.3  0.1 -  1.0 (K/uL)    Eosinophils Relative 0  0 - 5 (%)    Eosinophils Absolute 0.0  0.0 - 0.7 (K/uL)    Basophils Relative 0  0 - 1 (%)    Basophils Absolute 0.0  0.0 - 0.1 (K/uL)   COMPREHENSIVE METABOLIC PANEL     Status: Abnormal   Collection Time   09/14/11  3:20 PM      Component Value Range Comment   Sodium 131 (*) 135 - 145 (mEq/L)    Potassium 4.3  3.5 - 5.1 (mEq/L)    Chloride 98  96 - 112 (mEq/L)    CO2 20  19 - 32 (mEq/L)    Glucose, Bld 266 (*) 70 - 99 (mg/dL)    BUN 22  6 - 23 (mg/dL)    Creatinine, Ser 9.56 (*) 0.50 - 1.35 (mg/dL)    Calcium 9.0  8.4 - 10.5 (mg/dL)    Total Protein 7.4  6.0 - 8.3 (g/dL)    Albumin 3.4 (*) 3.5 - 5.2 (g/dL)    AST 213 (*) 0 - 37 (U/L)    ALT 232 (*) 0 - 53 (U/L)    Alkaline Phosphatase 240 (*) 39 - 117 (U/L)    Total Bilirubin 5.8 (*) 0.3 - 1.2 (mg/dL)    GFR calc non Af Amer 39 (*) >90 (mL/min)    GFR calc Af  Amer 45 (*) >90 (mL/min)   LIPASE, BLOOD     Status: Abnormal   Collection Time   09/14/11  3:20 PM      Component Value Range Comment   Lipase 91 (*) 11 - 59 (U/L)   LACTIC ACID, PLASMA     Status: Abnormal   Collection Time   09/14/11  5:07 PM      Component Value Range Comment   Lactic Acid, Venous 3.7 (*) 0.5 - 2.2 (mmol/L)   MRSA PCR SCREENING     Status: Normal   Collection Time   09/15/11 12:10 AM      Component Value Range Comment   MRSA by PCR NEGATIVE  NEGATIVE    COMPREHENSIVE METABOLIC PANEL     Status: Abnormal   Collection Time   09/15/11  3:54 AM      Component Value Range Comment   Sodium 133 (*) 135 - 145 (mEq/L)    Potassium 4.0  3.5 - 5.1 (mEq/L)    Chloride 103  96 - 112 (mEq/L)    CO2 19  19 - 32 (mEq/L)    Glucose, Bld 103 (*) 70 - 99 (mg/dL)    BUN 27 (*) 6 - 23 (mg/dL)    Creatinine, Ser 0.86 (*) 0.50 - 1.35 (mg/dL)    Calcium 7.8 (*) 8.4 - 10.5 (mg/dL)    Total Protein 5.6 (*) 6.0 - 8.3 (g/dL)    Albumin 2.5 (*) 3.5 - 5.2 (g/dL)    AST 578 (*) 0 - 37 (U/L)    ALT 162 (*) 0 - 53 (U/L)    Alkaline Phosphatase 188 (*) 39 - 117 (U/L)    Total Bilirubin 4.1 (*) 0.3 - 1.2 (mg/dL)    GFR calc non Af Amer 29 (*) >90 (mL/min)    GFR calc Af Amer 34 (*) >90 (mL/min)   CBC     Status: Abnormal   Collection Time   09/15/11  3:54 AM      Component Value Range Comment   WBC 10.7 (*) 4.0 - 10.5 (K/uL)    RBC 3.92 (*)  4.22 - 5.81 (MIL/uL)    Hemoglobin 9.4 (*) 13.0 - 17.0 (g/dL)    HCT 16.1 (*) 09.6 - 52.0 (%)    MCV 74.7 (*) 78.0 - 100.0 (fL)    MCH 24.0 (*) 26.0 - 34.0 (pg)    MCHC 32.1  30.0 - 36.0 (g/dL)    RDW 04.5 (*) 40.9 - 15.5 (%)    Platelets 202  150 - 400 (K/uL)   MAGNESIUM     Status: Normal   Collection Time   09/15/11  3:54 AM      Component Value Range Comment   Magnesium 1.6  1.5 - 2.5 (mg/dL)   PHOSPHORUS     Status: Normal   Collection Time   09/15/11  3:54 AM      Component Value Range Comment   Phosphorus 2.5  2.3 - 4.6 (mg/dL)     LACTIC ACID, PLASMA     Status: Normal   Collection Time   09/15/11  5:11 AM      Component Value Range Comment   Lactic Acid, Venous 0.9  0.5 - 2.2 (mmol/L)   GLUCOSE, CAPILLARY     Status: Abnormal   Collection Time   09/15/11  8:05 AM      Component Value Range Comment   Glucose-Capillary 124 (*) 70 - 99 (mg/dL)    Comment 1 Documented in Chart      Comment 2 Notify RN     GLUCOSE, CAPILLARY     Status: Abnormal   Collection Time   09/15/11 11:45 AM      Component Value Range Comment   Glucose-Capillary 127 (*) 70 - 99 (mg/dL)    Comment 1 Documented in Chart      Comment 2 Notify RN       Ct Abdomen Pelvis W Wo Contrast  09/14/2011  *RADIOLOGY REPORT*  Clinical Data: Right upper quadrant abdominal pain.  History gallstones and pancreatitis.  CT ABDOMEN AND PELVIS WITHOUT AND WITH CONTRAST  Technique:  Multidetector CT imaging of the abdomen and pelvis was performed without contrast material in one or both body regions, followed by contrast material(s) and further sections in one or both body regions.  Contrast: 80mL OMNIPAQUE IOHEXOL 300 MG/ML  SOLN  Comparison: 09/14/2011 ultrasound; CT scan from 02/05/2007  Findings: Punctate calcifications in the liver and spleen are probably from old granulomatous disease.  There is mild atelectasis in the lingula, left lower lobe, and posterior basal segment right lower lobe.  Several punctate calcifications present in the pancreatic body and head.  Mild peripancreatic stranding noted, particularly in the pancreatic head region.  Dependent gallstones noted in the gallbladder.  In the common bile duct on image 48 of series 3 and also probably in the vicinity of the ampulla, focal high densities favor choledocholithiasis.  Pancreatic atrophy noted.  Porta hepatis nodes measure up to 10 mm in short axis.  Small peripancreatic lymph nodes are also present, measuring up to about 10 mm in short axis.  There is an indistinct 3.4 x 2.7 cm region of hypodensity  inferiorly in the lateral segment left hepatic lobe which is technically nonspecific and visible precontrast and postcontrast images.  This may reflect focal fatty infiltration although underlying lesion is difficult to exclude.  No definite lesion was visible on this position on the prior exam from 2008.  Left adrenal gland mass measures 1.8 x 1.6 cm, technically nonspecific based on the imaging characteristics.  Atherosclerotic calcification of the abdominal aorta is present.  Abnormal exophytic lesion of the left kidney lower pole is enhancing and suspicious for a renal cell carcinoma.  No tumor thrombus in the left renal vein is observed.  This mass may measure up to 5 cm in diameter.  Lumbar spondylosis and degenerative disc disease noted, with right foraminal stenosis at L5-S1.  The urinary bladder appears unremarkable.  IMPRESSION:  1.  Suspected choledocholithiasis with several stones in the common bile duct as a probable cause for the biliary dilatation. 2.  Exophytic enhancing mass in the right kidney lower pole, approximately 5 cm in diameter, probably a renal cell carcinoma. 3.  Indistinct low density lesion inferiorly in the lateral segment left hepatic lobe may represent fatty infiltration given the geographic appearance, but is essentially nonspecific.  MRI characterization is recommended using dynamic hepatic protocol with feasible. 4.  Nonspecific small left adrenal mass. 5.  Cholelithiasis. 6.  Old granulomatous disease. 7.  Mild findings of chronic pancreatitis.  Pancreatic atrophy. 8.  Mild peripancreatic stranding especially along the pancreatic head - pancreatitis is not excluded.  No acute fluid collections noted. 9.  Atherosclerosis. 10.  Lumbar spondylosis and degenerative disc disease, with right foraminal stenosis at L5-S1.  Original Report Authenticated By: Dellia Cloud, M.D.   US Abdomen Complete  09/14/2011  *RADIOLOGY REPORT*  Clinical Data:  Abdominal pain.  ABDOMINAL  ULTRASOUND COMPLETE  Comparison:  None.  Findings:  Gallbladder:  Multiple small less than 1 cm gallstones are seen. No evidence of gallbladder dilatation wall thickening.  Common Bile Duct:  Dilated measuring 9 mm.  Intrahepatic bile ducts also appear dilated in the left hepatic lobe.  Liver: Coarse increased echotexture, consistent with steatosis or cirrhosis.  No focal liver mass identified.  IVC:  Appears normal.  Pancreas:  Dilated pancreatic duct seen in the pancreatic body measuring 7 mm.  Pancreatic head not visualized.  Spleen:  Within normal limits in size and echotexture.  Right kidney:  Normal in size and parenchymal echogenicity.  No evidence of mass or hydronephrosis.  Left kidney:  Normal in size and parenchymal echogenicity.  No evidence of mass or hydronephrosis.  Abdominal Aorta:  No aneurysm identified.  IMPRESSION:  1.  Cholelithiasis.  No sonographic signs of acute cholecystitis. 2.  Biliary and pancreatic ductal dilatation.  Etiology not visualized by ultrasound.  Pancreatic head mass or choledocholithiasis cannot be excluded.  Consider abdomen CT without and with contrast (pancreatic mass protocol) for further evaluation. 3.  Hepatic steatosis or cirrhosis.  Original Report Authenticated By: Danae Orleans, M.D.    Review of Systems  Constitutional: Negative.   HENT: Negative.  Negative for neck pain.   Eyes: Negative.   Respiratory: Positive for cough (dry). Negative for hemoptysis, sputum production, shortness of breath and wheezing.   Cardiovascular: Positive for leg swelling (if he does not take his water pill).  Gastrointestinal: Positive for heartburn, nausea, vomiting, abdominal pain and diarrhea. Negative for constipation, blood in stool and melena.  Genitourinary: Negative.   Musculoskeletal: Negative for myalgias, back pain, joint pain and falls.       Chronic lower extremity neuropathy  Skin: Negative.   Neurological: Negative.   Endo/Heme/Allergies: Negative.     Psychiatric/Behavioral: Negative.    Blood pressure 130/68, pulse 88, temperature 98.4 F (36.9 C), temperature source Oral, resp. rate 15, height 6' (1.829 m), weight 126.9 kg (279 lb 12.2 oz), SpO2 100.00%. Physical Exam  Constitutional: He is oriented to person, place, and time. He appears well-developed and well-nourished.  Up in chair, no acute distress  HENT:  Head: Normocephalic and atraumatic.  Nose: Nose normal.  Eyes: Conjunctivae and EOM are normal. Pupils are equal, round, and reactive to light. Right eye exhibits no discharge. Left eye exhibits no discharge. No scleral icterus.  Neck: Normal range of motion. Neck supple. No JVD present. No tracheal deviation present. No thyromegaly present.  Cardiovascular: Normal rate and regular rhythm.  Exam reveals no gallop.   Murmur heard. Respiratory: Effort normal and breath sounds normal. No stridor. No respiratory distress. He has no wheezes. He has no rales. He exhibits no tenderness.  GI: Soft. Bowel sounds are normal. He exhibits no distension and no mass. There is no tenderness. There is no rebound and no guarding.       He has a bucket handle incision.  Musculoskeletal: Normal range of motion. He exhibits no edema and no tenderness.  Lymphadenopathy:    He has no cervical adenopathy.  Neurological: He is alert and oriented to person, place, and time. He has normal reflexes. No cranial nerve deficit. Coordination normal.  Skin: Skin is warm and dry. No rash noted. No erythema. No pallor.  Psychiatric: He has a normal mood and affect. His behavior is normal. Judgment and thought content normal.    Assessment/Plan: 1. Cholelithiasis, choledocholithiasis, possible cholangitis. 2. History of gastric perforation, history of partial gastrectomy in the past. 3. Also diabetes mellitus type 2 insulin-dependent with poor control. 4. Diabetic neuropathy with chronic discomfort.  Nonhealing ulcer left foot. 5. Sleep apnea with CPAP  machine. He's been noncompliant for several month because of broken device. 6. BMI of 37.9 7. History pancreatitis 2-3 years ago. 8. History of coronary artery disease with prior stenting. 9. Dyslipidemia  Plan: Dr. Abbey Chatters will review the studies, and we will follow with you. Agree with antibiotics and ERCP. Further workup and treatment as indicated.  Darryl Blumenstein 09/15/2011, 2:11 PM

## 2011-09-16 ENCOUNTER — Encounter (HOSPITAL_COMMUNITY): Payer: Self-pay | Admitting: Anesthesiology

## 2011-09-16 ENCOUNTER — Inpatient Hospital Stay (HOSPITAL_COMMUNITY): Payer: Medicare Other | Admitting: Anesthesiology

## 2011-09-16 ENCOUNTER — Encounter (HOSPITAL_COMMUNITY)
Admission: EM | Disposition: A | Discharge status: Home / Self Care | Payer: Self-pay | Source: Home / Self Care | Attending: Internal Medicine

## 2011-09-16 ENCOUNTER — Inpatient Hospital Stay (HOSPITAL_COMMUNITY): Payer: Medicare Other

## 2011-09-16 DIAGNOSIS — K8309 Other cholangitis: Secondary | ICD-10-CM

## 2011-09-16 DIAGNOSIS — N189 Chronic kidney disease, unspecified: Secondary | ICD-10-CM

## 2011-09-16 DIAGNOSIS — R1900 Intra-abdominal and pelvic swelling, mass and lump, unspecified site: Secondary | ICD-10-CM

## 2011-09-16 DIAGNOSIS — E118 Type 2 diabetes mellitus with unspecified complications: Secondary | ICD-10-CM

## 2011-09-16 DIAGNOSIS — E1165 Type 2 diabetes mellitus with hyperglycemia: Secondary | ICD-10-CM

## 2011-09-16 HISTORY — PX: ESOPHAGOGASTRODUODENOSCOPY: SHX5428

## 2011-09-16 LAB — CBC
Hemoglobin: 9.6 g/dL — ABNORMAL LOW (ref 13.0–17.0)
MCH: 24.2 pg — ABNORMAL LOW (ref 26.0–34.0)
MCV: 74.5 fL — ABNORMAL LOW (ref 78.0–100.0)
RBC: 3.96 MIL/uL — ABNORMAL LOW (ref 4.22–5.81)

## 2011-09-16 LAB — GLUCOSE, CAPILLARY
Glucose-Capillary: 118 mg/dL — ABNORMAL HIGH (ref 70–99)
Glucose-Capillary: 142 mg/dL — ABNORMAL HIGH (ref 70–99)
Glucose-Capillary: 150 mg/dL — ABNORMAL HIGH (ref 70–99)
Glucose-Capillary: 162 mg/dL — ABNORMAL HIGH (ref 70–99)

## 2011-09-16 LAB — DIFFERENTIAL
Basophils Absolute: 0 10*3/uL (ref 0.0–0.1)
Lymphocytes Relative: 17 % (ref 12–46)
Monocytes Absolute: 0.8 10*3/uL (ref 0.1–1.0)
Monocytes Relative: 13 % — ABNORMAL HIGH (ref 3–12)
Neutro Abs: 4.1 10*3/uL (ref 1.7–7.7)

## 2011-09-16 LAB — COMPREHENSIVE METABOLIC PANEL
AST: 122 U/L — ABNORMAL HIGH (ref 0–37)
Albumin: 2.6 g/dL — ABNORMAL LOW (ref 3.5–5.2)
Calcium: 8.3 mg/dL — ABNORMAL LOW (ref 8.4–10.5)
Creatinine, Ser: 1.74 mg/dL — ABNORMAL HIGH (ref 0.50–1.35)

## 2011-09-16 SURGERY — EGD (ESOPHAGOGASTRODUODENOSCOPY)
Anesthesia: General

## 2011-09-16 MED ORDER — MIDAZOLAM HCL 5 MG/5ML IJ SOLN
INTRAMUSCULAR | Status: DC | PRN
  Administered 2011-09-16: 2 mg via INTRAVENOUS

## 2011-09-16 MED ORDER — LACTATED RINGERS IV SOLN
INTRAVENOUS | Status: DC | PRN
  Administered 2011-09-16: 12:00:00 via INTRAVENOUS

## 2011-09-16 MED ORDER — LIDOCAINE HCL (CARDIAC) 20 MG/ML IV SOLN
INTRAVENOUS | Status: DC | PRN
  Administered 2011-09-16: 50 mg via INTRAVENOUS

## 2011-09-16 MED ORDER — FENTANYL CITRATE 0.05 MG/ML IJ SOLN: 25.0000 ug | INTRAMUSCULAR | Status: DC | PRN

## 2011-09-16 MED ORDER — GLUCOSE-VITAMIN C 4-6 GM-MG PO CHEW
CHEWABLE_TABLET | ORAL | Status: AC
  Administered 2011-09-16: 4 g
  Filled 2011-09-16: qty 1

## 2011-09-16 MED ORDER — GLUCAGON HCL (RDNA) 1 MG IJ SOLR
INTRAMUSCULAR | Status: AC
  Filled 2011-09-16: qty 2

## 2011-09-16 MED ORDER — FENTANYL CITRATE 0.05 MG/ML IJ SOLN
INTRAMUSCULAR | Status: DC | PRN
  Administered 2011-09-16: 75 ug via INTRAVENOUS

## 2011-09-16 MED ORDER — PROPOFOL 10 MG/ML IV EMUL
INTRAVENOUS | Status: DC | PRN
  Administered 2011-09-16: 200 mg via INTRAVENOUS

## 2011-09-16 MED ORDER — DEXTROSE 50 % IV SOLN
25.0000 mL | Freq: Once | INTRAVENOUS | Status: AC
  Administered 2011-09-16: 25 mL via INTRAVENOUS
  Filled 2011-09-16: qty 50

## 2011-09-16 MED ORDER — LACTATED RINGERS IV SOLN
INTRAVENOUS | Status: DC
  Administered 2011-09-16: 11:00:00 via INTRAVENOUS

## 2011-09-16 MED ORDER — DEXTROSE-NACL 5-0.9 % IV SOLN
INTRAVENOUS | Status: DC
  Administered 2011-09-16: 50 mL/h via INTRAVENOUS

## 2011-09-16 MED ORDER — SUCCINYLCHOLINE CHLORIDE 20 MG/ML IJ SOLN
INTRAMUSCULAR | Status: DC | PRN
  Administered 2011-09-16: 5 mg via INTRAVENOUS

## 2011-09-16 MED ORDER — DEXTROSE 250 MG/ML IV SOLN
25.0000 g | Freq: Once | INTRAVENOUS | Status: DC
  Filled 2011-09-16: qty 100

## 2011-09-16 NOTE — Op Note (Signed)
[facility] [address] [place], [place]  [address]  ERCP PROCEDURE REPORT  PATIENT:  Frank Ashley, Frank Ashley  MR#:  [id number] BIRTHDATE:  06/20/1940  GENDER:  male  ENDOSCOPIST:  Dorena Cookey ASSISTANT:  Claudie Revering, RN CGRN, Jimmey Ralph, RN, CGRN  PROCEDURE DATE:  09/16/2011 PROCEDURE: ASA CLASS:  INDICATIONS:  MEDICATIONS:  General anesthesia TOPICAL ANESTHETIC:  DESCRIPTION OF PROCEDURE:   After the risks benefits and alternatives of the procedure were thoroughly explained, informed consent was obtained.  The Pentax ERCP [id number] endoscope was introduced through the mouth and advanced to the . The esophagus was normal. There is a fairly large gastric pouch with a single patent stoma which deferred stented to clearly visible jejunal limbs. Both limbs were explored to the length of the scope with no blind termination, bile, papilla of Vater, nor any specific abnormalities in either limb. Retroflex view the cardia was unremarkable in the gastric pouch generally appeared healthy and normal. The scope was then withdrawn and the patient returned to the recovery room in stable condition. He tolerated the procedure well there were no immediate complications. <<PROCEDUREIMAGES>>  COMPLICATIONS:  None  ENDOSCOPIC IMPRESSION: Apparent Billroth II gastrojejunostomy anatomy, unable to locate papilla of Vater  RECOMMENDATIONS: We'll discuss with general surgery surgical versus percutaneous approach.  ______________________________ Dorena Cookey  CC:  n. eSIGNED:   Dorena Cookey at 09/16/2011 12:39 PM  Bethann Humble, [phone]

## 2011-09-16 NOTE — Progress Notes (Addendum)
Inpatient Diabetes Program Recommendations  AACE/ADA: New Consensus Statement on Inpatient Glycemic Control (2009)  Target Ranges:  Prepandial:   less than 140 mg/dL      Peak postprandial:   less than 180 mg/dL (1-2 hours)      Critically ill patients:  140 - 180 mg/dL   Reason for Visit: Results for Frank Ashley, Frank Ashley (MRN [id number]) as of 09/16/2011 09:45  Ref. Range 09/15/2011 17:00 09/15/2011 19:57 09/16/2011 07:29 09/16/2011 08:04 09/16/2011 09:11  Glucose-Capillary Latest Range: 70-99 mg/dL 130 (H) 865 (H) 64 (L) 66 (L) 118 (H)  Agree with holding 70/30 this AM.    Inpatient Diabetes Program Recommendations Insulin - Basal: Consider holding 70/30 while NPO.  If patient needs basal insulin, consider adding Lantus 40 units daily, however CBG's were low this AM.  Note: Will follow.  Addendum:  Discussed with RN.  Will need to call MD to address evening insulin dose of 70/30.

## 2011-09-16 NOTE — Anesthesia Preprocedure Evaluation (Addendum)
Anesthesia Evaluation  Patient identified by MRN, date of birth, ID band Patient awake    Reviewed: Allergy & Precautions, H&P , NPO status , Patient's Chart, lab work & pertinent test results  Airway Mallampati: III TM Distance: >3 FB Neck ROM: full    Dental  (+) Dental Advisory Given and Caps,    Pulmonary sleep apnea and Continuous Positive Airway Pressure Ventilation ,  breath sounds clear to auscultation  Pulmonary exam normal       Cardiovascular Exercise Tolerance: Good + CAD and + Cardiac Stents Rhythm:regular Rate:Normal  Stent 1997. OK now   Neuro/Psych Peripheral neuropathy negative neurological ROS  negative psych ROS   GI/Hepatic negative GI ROS, Neg liver ROS, GERD-  Medicated and Controlled,Acute pancreatitis.   Endo/Other  Diabetes mellitus-, Well Controlled, Type 2, Insulin Dependent  Renal/GU negative Renal ROS  negative genitourinary   Musculoskeletal   Abdominal   Peds  Hematology negative hematology ROS (+)   Anesthesia Other Findings   Reproductive/Obstetrics negative OB ROS                          Anesthesia Physical Anesthesia Plan  ASA: III  Anesthesia Plan: General   Post-op Pain Management:    Induction: Intravenous  Airway Management Planned: Oral ETT  Additional Equipment:   Intra-op Plan:   Post-operative Plan: Extubation in OR  Informed Consent: I have reviewed the patients History and Physical, chart, labs and discussed the procedure including the risks, benefits and alternatives for the proposed anesthesia with the patient or authorized representative who has indicated his/her understanding and acceptance.   Dental Advisory Given  Plan Discussed with: CRNA and Surgeon  Anesthesia Plan Comments:        Anesthesia Quick Evaluation

## 2011-09-16 NOTE — Progress Notes (Signed)
Removed R great toe dressing per Dr. Ashley Royalty request. New orders given to apply Iodoform gauze to R great Toe site and cover with dry gauze twice a day.   Ordered Iodoform gauze from materials, will not be avaialble till  The AM. Will followup with dayshift nurse.

## 2011-09-16 NOTE — Transfer of Care (Signed)
Immediate Anesthesia Transfer of Care Note  Patient: Frank Ashley  Procedure(s) Performed: Procedure(s) (LRB): ENDOSCOPIC RETROGRADE CHOLANGIOPANCREATOGRAPHY (ERCP) (N/A)  Patient Location: PACU and Endoscopy Unit  Anesthesia Type: General  Level of Consciousness: alert , oriented and patient cooperative  Airway & Oxygen Therapy: Patient Spontanous Breathing and Patient connected to face mask oxygen  Post-op Assessment: Report given to PACU RN, Post -op Vital signs reviewed and stable and Patient moving all extremities  Post vital signs: Reviewed and stable  Complications: No apparent anesthesia complications

## 2011-09-16 NOTE — Progress Notes (Addendum)
PT NOTE:  Treatment is cancelled today due to medical issues with patient which prohibited therapy. Patient went down for endoscopy per nursing.  Nursing also states that patient is independent in room and she feels he may not need PT.  MD:  Please discontinue PT if entered in error and PT will not evaluate patient upon return to review chart tomorrow.  Thanks!  [facility] [phone] (pager)

## 2011-09-16 NOTE — Preoperative (Signed)
Beta Blockers   Reason not to administer Beta Blockers:Not Applicable 

## 2011-09-16 NOTE — Progress Notes (Signed)
Given his gastrointestinal anatomy, ERCP was not able to be done. The 2 remaining options would be percutaneous transhepatic drainage of the common bile duct with delayed cholecystectomy and common bile duct exploration or open cholecystectomy with common bile duct exploration. Given his 3 previous gastric surgeries the risks for an operation are increased. The risks include but not limited to bleeding, infection, wound problems, anesthesia, injury to intra-abdominal organs, need for other operations or procedures. This was all discussed with him and his wife at length and questions were answered. I will consult interventional radiology tomorrow to see if the percutaneous transhepatic procedure can be done. If not, we will proceed with the surgical option.

## 2011-09-16 NOTE — Progress Notes (Signed)
Subjective: Pt states that pain in epigastric region much improved since yesterday. Has some mild discomfort in neck which he associates with new sleeping surface.  Interval History: pt had an episode of mild hypoglycemia which did not respond to oral glucose tabs.  Objective: Filed Vitals:   09/15/11 2000 09/16/11 0000 09/16/11 0400 09/16/11 0800  BP:    126/58  Pulse:    86  Temp: 99.6 F (37.6 C) 99.8 F (37.7 C) 99 F (37.2 C) 98.7 F (37.1 C)  TempSrc: Oral Oral Oral Oral  Resp:    12  Height:      Weight:  129.6 kg (285 lb 11.5 oz)    SpO2:    97%   Weight change: 2.7 kg (5 lb 15.2 oz)  Intake/Output Summary (Last 24 hours) at 09/16/11 0833 Last data filed at 09/16/11 0800  Gross per 24 hour  Intake  312.5 ml  Output   2200 ml  Net -1887.5 ml    General: Alert, awake, oriented x3, in no acute distress.  HEENT: [place]/AT PEERL, EOMI Neck: Trachea midline,  no masses, no thyromegal,y no JVD, no carotid bruit OROPHARYNX:  Moist, No exudate/ erythema/lesions.  Heart: Regular rate and rhythm, without murmurs, rubs, gallops, PMI non-displaced, no heaves or thrills on palpation.  Lungs: Clear to auscultation, no wheezing or rhonchi noted. No increased vocal fremitus resonant to percussion  Abdomen: Soft, mild epigastric tenderness,  Mildly distended, positive bowel sounds, no masses no hepatosplenomegaly noted..  Neuro: No focal neurological deficits noted cranial nerves II through XII grossly intact. DTRs 2+ bilaterally upper and lower extremities. Strength 5/5 in bilateral upper and lower extremities. Musculoskeletal: No warm swelling or erythema around joints, no spinal tenderness noted. Skin: Wound dressed  Lab Results:  Basename 09/16/11 0036 09/15/11 0354  NA 133* 133*  K 3.7 4.0  CL 101 103  CO2 22 19  GLUCOSE 55* 103*  BUN 26* 27*  CREATININE 1.74* 2.15*  CALCIUM 8.3* 7.8*  MG -- 1.6  PHOS -- 2.5    Basename 09/16/11 0036 09/15/11 0354  AST 122* 154*  ALT  142* 162*  ALKPHOS 237* 188*  BILITOT 5.1* 4.1*  PROT 6.0 5.6*  ALBUMIN 2.6* 2.5*    Basename 09/16/11 0036 09/14/11 1520 09/13/11 1428  LIPASE 37 91* --  AMYLASE -- -- 70    Basename 09/16/11 0036 09/15/11 0354 09/14/11 1520  WBC 6.3 10.7* --  NEUTROABS 4.1 -- 6.9  HGB 9.6* 9.4* --  HCT 29.5* 29.3* --  MCV 74.5* 74.7* --  PLT 227 202 --   No results found for this basename: CKTOTAL:3,CKMB:3,CKMBINDEX:3,TROPONINI:3 in the last 72 hours No components found with this basename: POCBNP:3 No results found for this basename: DDIMER:2 in the last 72 hours No results found for this basename: HGBA1C:2 in the last 72 hours No results found for this basename: CHOL:2,HDL:2,LDLCALC:2,TRIG:2,CHOLHDL:2,LDLDIRECT:2 in the last 72 hours No results found for this basename: TSH,T4TOTAL,FREET3,T3FREE,THYROIDAB in the last 72 hours No results found for this basename: VITAMINB12:2,FOLATE:2,FERRITIN:2,TIBC:2,IRON:2,RETICCTPCT:2 in the last 72 hours  Micro Results: Recent Results (from the past 240 hour(s))  CULTURE, BLOOD (ROUTINE X 2)     Status: Normal (Preliminary result)   Collection Time   09/14/11  5:07 PM      Component Value Range Status Comment   Specimen Description BLOOD RIGHT HAND   Final    Special Requests BOTTLES DRAWN AEROBIC ONLY 5 CC   Final    Culture  Setup Time [id number]  Final    Culture     Final    Value:        BLOOD CULTURE RECEIVED NO GROWTH TO DATE CULTURE WILL BE HELD FOR 5 DAYS BEFORE ISSUING A FINAL NEGATIVE REPORT   Report Status PENDING   Incomplete   CULTURE, BLOOD (ROUTINE X 2)     Status: Normal (Preliminary result)   Collection Time   09/14/11  5:07 PM      Component Value Range Status Comment   Specimen Description BLOOD RIGHT ANTECUBITAL   Final    Special Requests BOTTLES DRAWN AEROBIC AND ANAEROBIC [facility]   Final    Culture  Setup Time [id number]   Final    Culture     Final    Value: GRAM NEGATIVE RODS     Note: Gram Stain Report Called  to,Read Back By and Verified With: VEE GRAVES @ 2135 ON 09/15/2011 HAJAM   Report Status PENDING   Incomplete   MRSA PCR SCREENING     Status: Normal   Collection Time   09/15/11 12:10 AM      Component Value Range Status Comment   MRSA by PCR NEGATIVE  NEGATIVE  Final     Studies/Results: Ct Abdomen Pelvis W Wo Contrast  09/14/2011  *RADIOLOGY REPORT*  Clinical Data: Right upper quadrant abdominal pain.  History gallstones and pancreatitis.  CT ABDOMEN AND PELVIS WITHOUT AND WITH CONTRAST  Technique:  Multidetector CT imaging of the abdomen and pelvis was performed without contrast material in one or both body regions, followed by contrast material(s) and further sections in one or both body regions.  Contrast: 80mL OMNIPAQUE IOHEXOL 300 MG/ML  SOLN  Comparison: 09/14/2011 ultrasound; CT scan from 02/05/2007  Findings: Punctate calcifications in the liver and spleen are probably from old granulomatous disease.  There is mild atelectasis in the lingula, left lower lobe, and posterior basal segment right lower lobe.  Several punctate calcifications present in the pancreatic body and head.  Mild peripancreatic stranding noted, particularly in the pancreatic head region.  Dependent gallstones noted in the gallbladder.  In the common bile duct on image 48 of series 3 and also probably in the vicinity of the ampulla, focal high densities favor choledocholithiasis.  Pancreatic atrophy noted.  Porta hepatis nodes measure up to 10 mm in short axis.  Small peripancreatic lymph nodes are also present, measuring up to about 10 mm in short axis.  There is an indistinct 3.4 x 2.7 cm region of hypodensity inferiorly in the lateral segment left hepatic lobe which is technically nonspecific and visible precontrast and postcontrast images.  This may reflect focal fatty infiltration although underlying lesion is difficult to exclude.  No definite lesion was visible on this position on the prior exam from 2008.  Left adrenal  gland mass measures 1.8 x 1.6 cm, technically nonspecific based on the imaging characteristics.  Atherosclerotic calcification of the abdominal aorta is present.  Abnormal exophytic lesion of the left kidney lower pole is enhancing and suspicious for a renal cell carcinoma.  No tumor thrombus in the left renal vein is observed.  This mass may measure up to 5 cm in diameter.  Lumbar spondylosis and degenerative disc disease noted, with right foraminal stenosis at L5-S1.  The urinary bladder appears unremarkable.  IMPRESSION:  1.  Suspected choledocholithiasis with several stones in the common bile duct as a probable cause for the biliary dilatation. 2.  Exophytic enhancing mass in the right kidney lower pole, approximately  5 cm in diameter, probably a renal cell carcinoma. 3.  Indistinct low density lesion inferiorly in the lateral segment left hepatic lobe may represent fatty infiltration given the geographic appearance, but is essentially nonspecific.  MRI characterization is recommended using dynamic hepatic protocol with feasible. 4.  Nonspecific small left adrenal mass. 5.  Cholelithiasis. 6.  Old granulomatous disease. 7.  Mild findings of chronic pancreatitis.  Pancreatic atrophy. 8.  Mild peripancreatic stranding especially along the pancreatic head - pancreatitis is not excluded.  No acute fluid collections noted. 9.  Atherosclerosis. 10.  Lumbar spondylosis and degenerative disc disease, with right foraminal stenosis at L5-S1.  Original Report Authenticated By: Dellia Cloud, M.D.   US Abdomen Complete  09/14/2011  *RADIOLOGY REPORT*  Clinical Data:  Abdominal pain.  ABDOMINAL ULTRASOUND COMPLETE  Comparison:  None.  Findings:  Gallbladder:  Multiple small less than 1 cm gallstones are seen. No evidence of gallbladder dilatation wall thickening.  Common Bile Duct:  Dilated measuring 9 mm.  Intrahepatic bile ducts also appear dilated in the left hepatic lobe.  Liver: Coarse increased echotexture,  consistent with steatosis or cirrhosis.  No focal liver mass identified.  IVC:  Appears normal.  Pancreas:  Dilated pancreatic duct seen in the pancreatic body measuring 7 mm.  Pancreatic head not visualized.  Spleen:  Within normal limits in size and echotexture.  Right kidney:  Normal in size and parenchymal echogenicity.  No evidence of mass or hydronephrosis.  Left kidney:  Normal in size and parenchymal echogenicity.  No evidence of mass or hydronephrosis.  Abdominal Aorta:  No aneurysm identified.  IMPRESSION:  1.  Cholelithiasis.  No sonographic signs of acute cholecystitis. 2.  Biliary and pancreatic ductal dilatation.  Etiology not visualized by ultrasound.  Pancreatic head mass or choledocholithiasis cannot be excluded.  Consider abdomen CT without and with contrast (pancreatic mass protocol) for further evaluation. 3.  Hepatic steatosis or cirrhosis.  Original Report Authenticated By: Danae Orleans, M.D.    Medications: I have reviewed the patient's current medications. Scheduled Meds:    . sodium chloride   Intravenous STAT  . aspirin  325 mg Oral BID  . dextrose  25 g Intravenous Once  . dextrose  25 mL Intravenous Once  . docusate sodium  100 mg Oral BID  . fenofibrate  160 mg Oral Daily  . gabapentin  800 mg Oral TID  . glucose-Vitamin C      . heparin  5,000 Units Subcutaneous Q8H  . insulin aspart  0-15 Units Subcutaneous TID WC  . insulin aspart protamine-insulin aspart  75 Units Subcutaneous Q supper  . insulin aspart protamine-insulin aspart  80 Units Subcutaneous Q breakfast  . piperacillin-tazobactam (ZOSYN)  IV  3.375 g Intravenous Q8H  . senna  1 tablet Oral BID  . simvastatin  40 mg Oral q1800  . sodium chloride  3 mL Intravenous Q12H   Continuous Infusions:    . dextrose 5 % and 0.9% NaCl    . DISCONTD: sodium chloride 150 mL/hr at 09/16/11 0800   PRN Meds:.acetaminophen, acetaminophen, HYDROmorphone (DILAUDID) injection, ondansetron (ZOFRAN) IV, ondansetron,  polyethylene glycol Assessment/Plan: Patient Active Hospital Problem List: Sepsis (09/14/2011)   Assessment: Pt not exhibiting sepsis physiology at present. Has responded well to hydration and antibiotics.    Plan: Continue Zosyn and IV hydration.  DM w/o complication type II (07/24/2008)   Assessment:Pt had episode of hypoglycemia likely as a result of NPO status. Will change IVF ti Dextrose until  pt able to eat.   DIABETIC FOOT ULCER, TOE (08/01/2008)   Assessment: The ulcer appears to be healing well. Her last wound ostomy care to look at it and make recommendations. In the meantime I will continue the dressing with iodoform gauze to the wound covered with a dry dressing.     GALLSTONES (03/23/2010)   Assessment: Pt with choledocholithiasis and ascending cholangitis.     RENAL INSUFFICIENCY, CHRONIC (07/24/2008)   Assessment: Patient has chronic renal insufficiency his baseline creatinine is unknown he'll contact his primary care physician to obtain those records to    Ascending cholangitis (09/14/2011)   Assessment: For ERCP today.     LOS: 2 days

## 2011-09-16 NOTE — Progress Notes (Signed)
CBG: 64  Treatment: 3 glucose tabs  Symptoms: Shaky  Follow-up CBG: Time:0804 CBG Result:66 going to give half amp of D50.  Will recheck sugar  Possible Reasons for Event: Inadequate meal intake  Comments/MD notified:Dr. Ashley Royalty notified    Frank Ashley

## 2011-09-16 NOTE — Progress Notes (Signed)
Subjective: Less abdominal pain.  Objective: Vital signs in last 24 hours: Temp:  [98.4 F (36.9 C)-99.8 F (37.7 C)] 98.7 F (37.1 C) (05/16 0800) Pulse Rate:  [86] 86  (05/16 0800) Resp:  [12] 12  (05/16 0800) BP: (126)/(58) 126/58 mmHg (05/16 0800) SpO2:  [97 %] 97 % (05/16 0800) Weight:  [285 lb 11.5 oz (129.6 kg)] 285 lb 11.5 oz (129.6 kg) (05/16 0000) Last BM Date: 09/13/11  Intake/Output from previous day: 05/15 0701 - 05/16 0700 In: -  Out: 2050 [Urine:2050] Intake/Output this shift: Total I/O In: 12.5 [IV Piggyback:12.5] Out: 250 [Urine:250]  PE: Abd-soft, minimal RUQ and epigastric tenderness  Lab Results:   [facility] 09/16/11 0036 09/15/11 0354  WBC 6.3 10.7*  HGB 9.6* 9.4*  HCT 29.5* 29.3*  PLT 227 202   BMET  Basename 09/16/11 0036 09/15/11 0354  NA 133* 133*  K 3.7 4.0  CL 101 103  CO2 22 19  GLUCOSE 55* 103*  BUN 26* 27*  CREATININE 1.74* 2.15*  CALCIUM 8.3* 7.8*   PT/INR No results found for this basename: LABPROT:2,INR:2 in the last 72 hours Comprehensive Metabolic Panel:    Component Value Date/Time   NA 133* 09/16/2011 0036   K 3.7 09/16/2011 0036   CL 101 09/16/2011 0036   CO2 22 09/16/2011 0036   BUN 26* 09/16/2011 0036   CREATININE 1.74* 09/16/2011 0036   GLUCOSE 55* 09/16/2011 0036   CALCIUM 8.3* 09/16/2011 0036   AST 122* 09/16/2011 0036   ALT 142* 09/16/2011 0036   ALKPHOS 237* 09/16/2011 0036   BILITOT 5.1* 09/16/2011 0036   PROT 6.0 09/16/2011 0036   ALBUMIN 2.6* 09/16/2011 0036     Studies/Results: Ct Abdomen Pelvis W Wo Contrast  09/14/2011  *RADIOLOGY REPORT*  Clinical Data: Right upper quadrant abdominal pain.  History gallstones and pancreatitis.  CT ABDOMEN AND PELVIS WITHOUT AND WITH CONTRAST  Technique:  Multidetector CT imaging of the abdomen and pelvis was performed without contrast material in one or both body regions, followed by contrast material(s) and further sections in one or both body regions.  Contrast: 80mL  OMNIPAQUE IOHEXOL 300 MG/ML  SOLN  Comparison: 09/14/2011 ultrasound; CT scan from 02/05/2007  Findings: Punctate calcifications in the liver and spleen are probably from old granulomatous disease.  There is mild atelectasis in the lingula, left lower lobe, and posterior basal segment right lower lobe.  Several punctate calcifications present in the pancreatic body and head.  Mild peripancreatic stranding noted, particularly in the pancreatic head region.  Dependent gallstones noted in the gallbladder.  In the common bile duct on image 48 of series 3 and also probably in the vicinity of the ampulla, focal high densities favor choledocholithiasis.  Pancreatic atrophy noted.  Porta hepatis nodes measure up to 10 mm in short axis.  Small peripancreatic lymph nodes are also present, measuring up to about 10 mm in short axis.  There is an indistinct 3.4 x 2.7 cm region of hypodensity inferiorly in the lateral segment left hepatic lobe which is technically nonspecific and visible precontrast and postcontrast images.  This may reflect focal fatty infiltration although underlying lesion is difficult to exclude.  No definite lesion was visible on this position on the prior exam from 2008.  Left adrenal gland mass measures 1.8 x 1.6 cm, technically nonspecific based on the imaging characteristics.  Atherosclerotic calcification of the abdominal aorta is present.  Abnormal exophytic lesion of the left kidney lower pole is enhancing and suspicious for a  renal cell carcinoma.  No tumor thrombus in the left renal vein is observed.  This mass may measure up to 5 cm in diameter.  Lumbar spondylosis and degenerative disc disease noted, with right foraminal stenosis at L5-S1.  The urinary bladder appears unremarkable.  IMPRESSION:  1.  Suspected choledocholithiasis with several stones in the common bile duct as a probable cause for the biliary dilatation. 2.  Exophytic enhancing mass in the right kidney lower pole, approximately 5 cm  in diameter, probably a renal cell carcinoma. 3.  Indistinct low density lesion inferiorly in the lateral segment left hepatic lobe may represent fatty infiltration given the geographic appearance, but is essentially nonspecific.  MRI characterization is recommended using dynamic hepatic protocol with feasible. 4.  Nonspecific small left adrenal mass. 5.  Cholelithiasis. 6.  Old granulomatous disease. 7.  Mild findings of chronic pancreatitis.  Pancreatic atrophy. 8.  Mild peripancreatic stranding especially along the pancreatic head - pancreatitis is not excluded.  No acute fluid collections noted. 9.  Atherosclerosis. 10.  Lumbar spondylosis and degenerative disc disease, with right foraminal stenosis at L5-S1.  Original Report Authenticated By: Dellia Cloud, M.D.   US Abdomen Complete  09/14/2011  *RADIOLOGY REPORT*  Clinical Data:  Abdominal pain.  ABDOMINAL ULTRASOUND COMPLETE  Comparison:  None.  Findings:  Gallbladder:  Multiple small less than 1 cm gallstones are seen. No evidence of gallbladder dilatation wall thickening.  Common Bile Duct:  Dilated measuring 9 mm.  Intrahepatic bile ducts also appear dilated in the left hepatic lobe.  Liver: Coarse increased echotexture, consistent with steatosis or cirrhosis.  No focal liver mass identified.  IVC:  Appears normal.  Pancreas:  Dilated pancreatic duct seen in the pancreatic body measuring 7 mm.  Pancreatic head not visualized.  Spleen:  Within normal limits in size and echotexture.  Right kidney:  Normal in size and parenchymal echogenicity.  No evidence of mass or hydronephrosis.  Left kidney:  Normal in size and parenchymal echogenicity.  No evidence of mass or hydronephrosis.  Abdominal Aorta:  No aneurysm identified.  IMPRESSION:  1.  Cholelithiasis.  No sonographic signs of acute cholecystitis. 2.  Biliary and pancreatic ductal dilatation.  Etiology not visualized by ultrasound.  Pancreatic head mass or choledocholithiasis cannot be  excluded.  Consider abdomen CT without and with contrast (pancreatic mass protocol) for further evaluation. 3.  Hepatic steatosis or cirrhosis.  Original Report Authenticated By: Danae Orleans, M.D.    Anti-infectives: Anti-infectives     Start     Dose/Rate Route Frequency Ordered Stop   09/15/11 0600  piperacillin-tazobactam (ZOSYN) IVPB 3.375 g       3.375 g 12.5 mL/hr over 240 Minutes Intravenous 3 times per day 09/15/11 0041     09/14/11 1830   vancomycin (VANCOCIN) IVPB 1000 mg/200 mL premix        1,000 mg 200 mL/hr over 60 Minutes Intravenous  Once 09/14/11 1807 09/14/11 2002   09/14/11 1830  piperacillin-tazobactam (ZOSYN) IVPB 3.375 g       3.375 g 12.5 mL/hr over 240 Minutes Intravenous  Once 09/14/11 1807 09/15/11 0112          Assessment Principal Problem:  *Sepsis due to cholangitis-improved. Active Problems:  Cholelithiasis  Choledocholithiasis    LOS: 2 days   Plan: ERCP today.   Tassie Pollett J 09/16/2011

## 2011-09-16 NOTE — Anesthesia Postprocedure Evaluation (Signed)
  Anesthesia Post-op Note  Patient: Frank Ashley  Procedure(s) Performed: Procedure(s) (LRB): ENDOSCOPIC RETROGRADE CHOLANGIOPANCREATOGRAPHY (ERCP) (N/A)  Patient Location: PACU  Anesthesia Type: General  Level of Consciousness: awake and alert   Airway and Oxygen Therapy: Patient Spontanous Breathing  Post-op Pain: mild  Post-op Assessment: Post-op Vital signs reviewed, Patient's Cardiovascular Status Stable, Respiratory Function Stable, Patent Airway and No signs of Nausea or vomiting  Post-op Vital Signs: stable  Complications: No apparent anesthesia complications

## 2011-09-17 ENCOUNTER — Inpatient Hospital Stay (HOSPITAL_COMMUNITY): Payer: Medicare Other

## 2011-09-17 ENCOUNTER — Encounter (HOSPITAL_COMMUNITY): Payer: Self-pay | Admitting: Anesthesiology

## 2011-09-17 ENCOUNTER — Encounter (HOSPITAL_COMMUNITY)
Admission: EM | Disposition: A | Discharge status: Home / Self Care | Payer: Self-pay | Source: Home / Self Care | Attending: Internal Medicine

## 2011-09-17 DIAGNOSIS — K8309 Other cholangitis: Secondary | ICD-10-CM

## 2011-09-17 DIAGNOSIS — N189 Chronic kidney disease, unspecified: Secondary | ICD-10-CM

## 2011-09-17 DIAGNOSIS — E1165 Type 2 diabetes mellitus with hyperglycemia: Secondary | ICD-10-CM

## 2011-09-17 DIAGNOSIS — E118 Type 2 diabetes mellitus with unspecified complications: Secondary | ICD-10-CM

## 2011-09-17 DIAGNOSIS — R1900 Intra-abdominal and pelvic swelling, mass and lump, unspecified site: Secondary | ICD-10-CM

## 2011-09-17 HISTORY — PX: CT PERC CHOLECYSTOSTOMY: HXRAD817

## 2011-09-17 LAB — DIFFERENTIAL
Basophils Absolute: 0 10*3/uL (ref 0.0–0.1)
Eosinophils Absolute: 0.4 10*3/uL (ref 0.0–0.7)
Eosinophils Relative: 10 % — ABNORMAL HIGH (ref 0–5)
Lymphocytes Relative: 23 % (ref 12–46)
Lymphs Abs: 0.9 10*3/uL (ref 0.7–4.0)
Neutrophils Relative %: 55 % (ref 43–77)

## 2011-09-17 LAB — CBC
MCH: 23.9 pg — ABNORMAL LOW (ref 26.0–34.0)
Platelets: 245 10*3/uL (ref 150–400)
RBC: 4.01 MIL/uL — ABNORMAL LOW (ref 4.22–5.81)
RDW: 17.1 % — ABNORMAL HIGH (ref 11.5–15.5)
WBC: 4 10*3/uL (ref 4.0–10.5)

## 2011-09-17 LAB — BASIC METABOLIC PANEL
Calcium: 8.6 mg/dL (ref 8.4–10.5)
Creatinine, Ser: 1.65 mg/dL — ABNORMAL HIGH (ref 0.50–1.35)
GFR calc non Af Amer: 40 mL/min — ABNORMAL LOW (ref >90)
Sodium: 134 mEq/L — ABNORMAL LOW (ref 135–145)

## 2011-09-17 LAB — PROTIME-INR
INR: 1.09 (ref 0.00–1.49)
Prothrombin Time: 14.3 seconds (ref 11.6–15.2)

## 2011-09-17 LAB — CULTURE, BLOOD (ROUTINE X 2): Culture  Setup Time: 201305150142

## 2011-09-17 LAB — GLUCOSE, CAPILLARY
Glucose-Capillary: 136 mg/dL — ABNORMAL HIGH (ref 70–99)
Glucose-Capillary: 156 mg/dL — ABNORMAL HIGH (ref 70–99)

## 2011-09-17 LAB — HEPATIC FUNCTION PANEL
ALT: 125 U/L — ABNORMAL HIGH (ref 0–53)
Albumin: 2.5 g/dL — ABNORMAL LOW (ref 3.5–5.2)
Indirect Bilirubin: 1.2 mg/dL — ABNORMAL HIGH (ref 0.3–0.9)
Total Protein: 6.1 g/dL (ref 6.0–8.3)

## 2011-09-17 LAB — ABO/RH: ABO/RH(D): B POS

## 2011-09-17 LAB — TYPE AND SCREEN

## 2011-09-17 SURGERY — CHOLECYSTECTOMY
Anesthesia: General

## 2011-09-17 MED ORDER — INSULIN ASPART 100 UNIT/ML ~~LOC~~ SOLN
0.0000 [IU] | SUBCUTANEOUS | Status: DC
  Administered 2011-09-17 (×3): 3 [IU] via SUBCUTANEOUS
  Administered 2011-09-18 (×3): 5 [IU] via SUBCUTANEOUS

## 2011-09-17 MED ORDER — IOHEXOL 300 MG/ML  SOLN
10.0000 mL | Freq: Once | INTRAMUSCULAR | Status: AC | PRN
  Administered 2011-09-17: 10 mL

## 2011-09-17 MED ORDER — FENTANYL CITRATE 0.05 MG/ML IJ SOLN
INTRAMUSCULAR | Status: AC | PRN
  Administered 2011-09-17: 100 ug via INTRAVENOUS

## 2011-09-17 MED ORDER — HYDROMORPHONE HCL PF 1 MG/ML IJ SOLN
1.0000 mg | INTRAMUSCULAR | Status: DC | PRN
  Administered 2011-09-17 – 2011-09-19 (×15): 1 mg via INTRAVENOUS
  Filled 2011-09-17 (×15): qty 1

## 2011-09-17 MED ORDER — HYDROMORPHONE HCL PF 1 MG/ML IJ SOLN
INTRAMUSCULAR | Status: AC
  Filled 2011-09-17: qty 1

## 2011-09-17 MED ORDER — MIDAZOLAM HCL 5 MG/5ML IJ SOLN
INTRAMUSCULAR | Status: AC | PRN
  Administered 2011-09-17: 2 mg via INTRAVENOUS

## 2011-09-17 SURGICAL SUPPLY — 35 items
APPLICATOR COTTON TIP 6IN STRL (MISCELLANEOUS) ×1 IMPLANT
BLADE EXTENDED COATED 6.5IN (ELECTRODE) IMPLANT
BLADE HEX COATED 2.75 (ELECTRODE) ×1 IMPLANT
CANISTER SUCTION 2500CC (MISCELLANEOUS) ×1 IMPLANT
CLOTH BEACON ORANGE TIMEOUT ST (SAFETY) ×1 IMPLANT
COVER MAYO STAND STRL (DRAPES) IMPLANT
DRAPE LAPAROSCOPIC ABDOMINAL (DRAPES) ×1 IMPLANT
DRAPE WARM FLUID 44X44 (DRAPE) IMPLANT
ELECT REM PT RETURN 9FT ADLT (ELECTROSURGICAL)
ELECTRODE REM PT RTRN 9FT ADLT (ELECTROSURGICAL) ×1 IMPLANT
GLOVE BIOGEL PI IND STRL 7.0 (GLOVE) ×1 IMPLANT
GLOVE BIOGEL PI INDICATOR 7.0 (GLOVE)
GLOVE ECLIPSE 8.0 STRL XLNG CF (GLOVE) ×1 IMPLANT
GLOVE INDICATOR 8.0 STRL GRN (GLOVE) ×2 IMPLANT
GOWN STRL NON-REIN LRG LVL3 (GOWN DISPOSABLE) ×1 IMPLANT
GOWN STRL REIN XL XLG (GOWN DISPOSABLE) ×2 IMPLANT
KIT BASIN OR (CUSTOM PROCEDURE TRAY) ×1 IMPLANT
NS IRRIG 1000ML POUR BTL (IV SOLUTION) ×1 IMPLANT
PACK GENERAL/GYN (CUSTOM PROCEDURE TRAY) ×1 IMPLANT
SPONGE GAUZE 4X4 12PLY (GAUZE/BANDAGES/DRESSINGS) ×1 IMPLANT
SPONGE LAP 18X18 X RAY DECT (DISPOSABLE) IMPLANT
STAPLER VISISTAT 35W (STAPLE) ×1 IMPLANT
SUCTION POOLE TIP (SUCTIONS) ×1 IMPLANT
SUT PDS AB 1 CTX 36 (SUTURE) IMPLANT
SUT SILK 2 0 (SUTURE)
SUT SILK 2 0 SH CR/8 (SUTURE) IMPLANT
SUT SILK 2-0 18XBRD TIE 12 (SUTURE) IMPLANT
SUT SILK 3 0 (SUTURE)
SUT SILK 3 0 SH CR/8 (SUTURE) IMPLANT
SUT SILK 3-0 18XBRD TIE 12 (SUTURE) IMPLANT
SUT VICRYL 2 0 18  UND BR (SUTURE)
SUT VICRYL 2 0 18 UND BR (SUTURE) IMPLANT
TOWEL OR 17X26 10 PK STRL BLUE (TOWEL DISPOSABLE) ×1 IMPLANT
TRAY FOLEY CATH 14FRSI W/METER (CATHETERS) IMPLANT
YANKAUER SUCT BULB TIP NO VENT (SUCTIONS) IMPLANT

## 2011-09-17 NOTE — Progress Notes (Signed)
Subjective: Pt is very anxious about pending procedures and possible surgery today. Pt appears to have more icterus today.  Interval History: Attempts for endoscopic approach were unsuccessful and pt needing IR or surgical approach.  Objective: Filed Vitals:   09/16/11 2345 09/17/11 0000 09/17/11 0400 09/17/11 0800  BP: 137/96  136/58   Pulse: 80     Temp:  98.3 F (36.8 C)  98 F (36.7 C)  TempSrc:  Oral  Oral  Resp: 19     Height:      Weight:  131.6 kg (290 lb 2 oz)    SpO2: 97%      Weight change: 2 kg (4 lb 6.6 oz)  Intake/Output Summary (Last 24 hours) at 09/17/11 1610 Last data filed at 09/17/11 0700  Gross per 24 hour  Intake   1838 ml  Output   2275 ml  Net   -437 ml    General: Alert, awake, oriented x3, in no acute distress. Mild icterus HEENT: [place]/AT PEERL, EOMI, Increased icterus. Neck: Trachea midline,  no masses, no thyromegal,y no JVD, no carotid bruit OROPHARYNX:  Moist, No exudate/ erythema/lesions.  Heart: Regular rate and rhythm, without murmurs, rubs, gallops, PMI non-displaced, no heaves or thrills on palpation.  Lungs: Clear to auscultation, no wheezing or rhonchi noted. No increased vocal fremitus resonant to percussion  Abdomen: Soft, mild epigastric tenderness,  Mildly distended, positive bowel sounds, no masses no hepatosplenomegaly noted..   Lab Results:  Basename 09/17/11 0345 09/16/11 0036 09/15/11 0354  NA 134* 133* --  K 4.0 3.7 --  CL 102 101 --  CO2 22 22 --  GLUCOSE 155* 55* --  BUN 22 26* --  CREATININE 1.65* 1.74* --  CALCIUM 8.6 8.3* --  MG -- -- 1.6  PHOS -- -- 2.5    Basename 09/17/11 0645 09/16/11 0036  AST 100* 122*  ALT 125* 142*  ALKPHOS 352* 237*  BILITOT 5.4* 5.1*  PROT 6.1 6.0  ALBUMIN 2.5* 2.6*    Basename 09/16/11 0036 09/14/11 1520  LIPASE 37 91*  AMYLASE -- --    Basename 09/17/11 0345 09/16/11 0036  WBC 4.0 6.3  NEUTROABS 2.2 4.1  HGB 9.6* 9.6*  HCT 30.0* 29.5*  MCV 74.8* 74.5*  PLT 245 227    No results found for this basename: CKTOTAL:3,CKMB:3,CKMBINDEX:3,TROPONINI:3 in the last 72 hours No components found with this basename: POCBNP:3 No results found for this basename: DDIMER:2 in the last 72 hours No results found for this basename: HGBA1C:2 in the last 72 hours No results found for this basename: CHOL:2,HDL:2,LDLCALC:2,TRIG:2,CHOLHDL:2,LDLDIRECT:2 in the last 72 hours No results found for this basename: TSH,T4TOTAL,FREET3,T3FREE,THYROIDAB in the last 72 hours No results found for this basename: VITAMINB12:2,FOLATE:2,FERRITIN:2,TIBC:2,IRON:2,RETICCTPCT:2 in the last 72 hours  Micro Results: Recent Results (from the past 240 hour(s))  CULTURE, BLOOD (ROUTINE X 2)     Status: Normal (Preliminary result)   Collection Time   09/14/11  5:07 PM      Component Value Range Status Comment   Specimen Description BLOOD RIGHT HAND   Final    Special Requests BOTTLES DRAWN AEROBIC ONLY 5 CC   Final    Culture  Setup Time [id number]   Final    Culture     Final    Value:        BLOOD CULTURE RECEIVED NO GROWTH TO DATE CULTURE WILL BE HELD FOR 5 DAYS BEFORE ISSUING A FINAL NEGATIVE REPORT   Report Status PENDING   Incomplete  CULTURE, BLOOD (ROUTINE X 2)     Status: Normal   Collection Time   09/14/11  5:07 PM      Component Value Range Status Comment   Specimen Description BLOOD RIGHT ANTECUBITAL   Final    Special Requests BOTTLES DRAWN AEROBIC AND ANAEROBIC [facility]   Final    Culture  Setup Time [id number]   Final    Culture     Final    Value: ESCHERICHIA COLI     Note: Gram Stain Report Called to,Read Back By and Verified With: VEE GRAVES @ 2135 ON 09/15/2011 HAJAM   Report Status 09/17/2011 FINAL   Final    Organism ID, Bacteria ESCHERICHIA COLI   Final   MRSA PCR SCREENING     Status: Normal   Collection Time   09/15/11 12:10 AM      Component Value Range Status Comment   MRSA by PCR NEGATIVE  NEGATIVE  Final     Studies/Results: Ct Abdomen Pelvis W Wo  Contrast  09/14/2011  *RADIOLOGY REPORT*  Clinical Data: Right upper quadrant abdominal pain.  History gallstones and pancreatitis.  CT ABDOMEN AND PELVIS WITHOUT AND WITH CONTRAST  Technique:  Multidetector CT imaging of the abdomen and pelvis was performed without contrast material in one or both body regions, followed by contrast material(s) and further sections in one or both body regions.  Contrast: 80mL OMNIPAQUE IOHEXOL 300 MG/ML  SOLN  Comparison: 09/14/2011 ultrasound; CT scan from 02/05/2007  Findings: Punctate calcifications in the liver and spleen are probably from old granulomatous disease.  There is mild atelectasis in the lingula, left lower lobe, and posterior basal segment right lower lobe.  Several punctate calcifications present in the pancreatic body and head.  Mild peripancreatic stranding noted, particularly in the pancreatic head region.  Dependent gallstones noted in the gallbladder.  In the common bile duct on image 48 of series 3 and also probably in the vicinity of the ampulla, focal high densities favor choledocholithiasis.  Pancreatic atrophy noted.  Porta hepatis nodes measure up to 10 mm in short axis.  Small peripancreatic lymph nodes are also present, measuring up to about 10 mm in short axis.  There is an indistinct 3.4 x 2.7 cm region of hypodensity inferiorly in the lateral segment left hepatic lobe which is technically nonspecific and visible precontrast and postcontrast images.  This may reflect focal fatty infiltration although underlying lesion is difficult to exclude.  No definite lesion was visible on this position on the prior exam from 2008.  Left adrenal gland mass measures 1.8 x 1.6 cm, technically nonspecific based on the imaging characteristics.  Atherosclerotic calcification of the abdominal aorta is present.  Abnormal exophytic lesion of the left kidney lower pole is enhancing and suspicious for a renal cell carcinoma.  No tumor thrombus in the left renal vein is  observed.  This mass may measure up to 5 cm in diameter.  Lumbar spondylosis and degenerative disc disease noted, with right foraminal stenosis at L5-S1.  The urinary bladder appears unremarkable.  IMPRESSION:  1.  Suspected choledocholithiasis with several stones in the common bile duct as a probable cause for the biliary dilatation. 2.  Exophytic enhancing mass in the right kidney lower pole, approximately 5 cm in diameter, probably a renal cell carcinoma. 3.  Indistinct low density lesion inferiorly in the lateral segment left hepatic lobe may represent fatty infiltration given the geographic appearance, but is essentially nonspecific.  MRI characterization is recommended using dynamic  hepatic protocol with feasible. 4.  Nonspecific small left adrenal mass. 5.  Cholelithiasis. 6.  Old granulomatous disease. 7.  Mild findings of chronic pancreatitis.  Pancreatic atrophy. 8.  Mild peripancreatic stranding especially along the pancreatic head - pancreatitis is not excluded.  No acute fluid collections noted. 9.  Atherosclerosis. 10.  Lumbar spondylosis and degenerative disc disease, with right foraminal stenosis at L5-S1.  Original Report Authenticated By: Dellia Cloud, M.D.   US Abdomen Complete  09/14/2011  *RADIOLOGY REPORT*  Clinical Data:  Abdominal pain.  ABDOMINAL ULTRASOUND COMPLETE  Comparison:  None.  Findings:  Gallbladder:  Multiple small less than 1 cm gallstones are seen. No evidence of gallbladder dilatation wall thickening.  Common Bile Duct:  Dilated measuring 9 mm.  Intrahepatic bile ducts also appear dilated in the left hepatic lobe.  Liver: Coarse increased echotexture, consistent with steatosis or cirrhosis.  No focal liver mass identified.  IVC:  Appears normal.  Pancreas:  Dilated pancreatic duct seen in the pancreatic body measuring 7 mm.  Pancreatic head not visualized.  Spleen:  Within normal limits in size and echotexture.  Right kidney:  Normal in size and parenchymal  echogenicity.  No evidence of mass or hydronephrosis.  Left kidney:  Normal in size and parenchymal echogenicity.  No evidence of mass or hydronephrosis.  Abdominal Aorta:  No aneurysm identified.  IMPRESSION:  1.  Cholelithiasis.  No sonographic signs of acute cholecystitis. 2.  Biliary and pancreatic ductal dilatation.  Etiology not visualized by ultrasound.  Pancreatic head mass or choledocholithiasis cannot be excluded.  Consider abdomen CT without and with contrast (pancreatic mass protocol) for further evaluation. 3.  Hepatic steatosis or cirrhosis.  Original Report Authenticated By: Danae Orleans, M.D.    Medications: I have reviewed the patient's current medications. Scheduled Meds:    . aspirin  325 mg Oral BID  . docusate sodium  100 mg Oral BID  . fenofibrate  160 mg Oral Daily  . gabapentin  800 mg Oral TID  . insulin aspart  0-15 Units Subcutaneous TID WC  . insulin aspart protamine-insulin aspart  75 Units Subcutaneous Q supper  . insulin aspart protamine-insulin aspart  80 Units Subcutaneous Q breakfast  . piperacillin-tazobactam (ZOSYN)  IV  3.375 g Intravenous Q8H  . senna  1 tablet Oral BID  . simvastatin  40 mg Oral q1800  . sodium chloride  3 mL Intravenous Q12H  . DISCONTD: heparin  5,000 Units Subcutaneous Q8H   Continuous Infusions:    . dextrose 5 % and 0.9% NaCl 50 mL/hr (09/16/11 0852)  . DISCONTD: lactated ringers     PRN Meds:.acetaminophen, acetaminophen, HYDROmorphone (DILAUDID) injection, ondansetron (ZOFRAN) IV, ondansetron, polyethylene glycol, DISCONTD: fentaNYL Assessment/Plan: Patient Active Hospital Problem List: Sepsis (09/14/2011)   Assessment: Resolved    Plan: On Zosyn day#3. Continue.  DM w/o complication type II (07/24/2008)   Assessment:BS stable while Pt NPO. Continue D5.   DIABETIC FOOT ULCER, TOE (08/01/2008)   Assessment: The ulcer appears to be healing well.In the meantime I will continue the dressing with iodoform gauze to the wound  covered with a dry dressing.     GALLSTONES (03/23/2010)   Assessment: Pt with choledocholithiasis and ascending cholangitis.     RENAL INSUFFICIENCY, CHRONIC (07/24/2008)   Assessment: Patient has chronic renal insufficiency his baseline creatinine is unknown he'll contact his primary care physician to obtain those records to    Ascending cholangitis (09/14/2011)   Assessment: Failed ERCP attempt. For IR attempt  and possible surgery today.     LOS: 3 days

## 2011-09-17 NOTE — Progress Notes (Signed)
1 Day Post-Op  Subjective: Resting comfortably  Objective: Vital signs in last 24 hours: Temp:  [97.7 F (36.5 C)-98.7 F (37.1 C)] 98.3 F (36.8 C) (05/17 0000) Pulse Rate:  [75-86] 80  (05/16 2345) Resp:  [8-19] 19  (05/16 2345) BP: (126-156)/(58-96) 136/58 mmHg (05/17 0400) SpO2:  [96 %-100 %] 97 % (05/16 2345) Weight:  [290 lb 2 oz (131.6 kg)] 290 lb 2 oz (131.6 kg) (05/17 0000) Last BM Date: 09/16/11  Intake/Output from previous day: 05/16 0701 - 05/17 0700 In: 1963 [P.O.:360; I.V.:1453; IV Piggyback:150] Out: 2525 [Urine:2525] Intake/Output this shift: Total I/O In: 872.5 [P.O.:360; I.V.:450; IV Piggyback:62.5] Out: 1500 [Urine:1500]  PE: Abd-soft  Lab Results:   Basename 09/17/11 0345 09/16/11 0036  WBC 4.0 6.3  HGB 9.6* 9.6*  HCT 30.0* 29.5*  PLT 245 227   BMET  Basename 09/17/11 0345 09/16/11 0036  NA 134* 133*  K 4.0 3.7  CL 102 101  CO2 22 22  GLUCOSE 155* 55*  BUN 22 26*  CREATININE 1.65* 1.74*  CALCIUM 8.6 8.3*   PT/INR No results found for this basename: LABPROT:2,INR:2 in the last 72 hours Comprehensive Metabolic Panel:    Component Value Date/Time   NA 134* 09/17/2011 0345   K 4.0 09/17/2011 0345   CL 102 09/17/2011 0345   CO2 22 09/17/2011 0345   BUN 22 09/17/2011 0345   CREATININE 1.65* 09/17/2011 0345   GLUCOSE 155* 09/17/2011 0345   CALCIUM 8.6 09/17/2011 0345   AST 122* 09/16/2011 0036   ALT 142* 09/16/2011 0036   ALKPHOS 237* 09/16/2011 0036   BILITOT 5.1* 09/16/2011 0036   PROT 6.0 09/16/2011 0036   ALBUMIN 2.6* 09/16/2011 0036     Studies/Results: No results found.  Anti-infectives: Anti-infectives     Start     Dose/Rate Route Frequency Ordered Stop   09/15/11 0600   piperacillin-tazobactam (ZOSYN) IVPB 3.375 g        3.375 g 12.5 mL/hr over 240 Minutes Intravenous 3 times per day 09/15/11 0041     09/14/11 1830   vancomycin (VANCOCIN) IVPB 1000 mg/200 mL premix        1,000 mg 200 mL/hr over 60 Minutes Intravenous  Once  09/14/11 1807 09/14/11 2002   09/14/11 1830   piperacillin-tazobactam (ZOSYN) IVPB 3.375 g        3.375 g 12.5 mL/hr over 240 Minutes Intravenous  Once 09/14/11 1807 09/15/11 0112          Assessment Principal Problem:  *Sepsis due to cholangitis-improved. Active Problems:  Cholelithiasis  Choledocholithiasis    LOS: 3 days   Plan:  IR consult to see if PTC can be done.  If not, will need open cholecystectomy with CBDE.   Frank Ashley J 09/17/2011   

## 2011-09-17 NOTE — H&P (View-Only) (Signed)
1 Day Post-Op  Subjective: Resting comfortably  Objective: Vital signs in last 24 hours: Temp:  [97.7 F (36.5 C)-98.7 F (37.1 C)] 98.3 F (36.8 C) (05/17 0000) Pulse Rate:  [75-86] 80  (05/16 2345) Resp:  [8-19] 19  (05/16 2345) BP: (126-156)/(58-96) 136/58 mmHg (05/17 0400) SpO2:  [96 %-100 %] 97 % (05/16 2345) Weight:  [290 lb 2 oz (131.6 kg)] 290 lb 2 oz (131.6 kg) (05/17 0000) Last BM Date: 09/16/11  Intake/Output from previous day: 05/16 0701 - 05/17 0700 In: 1963 [P.O.:360; I.V.:1453; IV Piggyback:150] Out: 2525 [Urine:2525] Intake/Output this shift: Total I/O In: 872.5 [P.O.:360; I.V.:450; IV Piggyback:62.5] Out: 1500 [Urine:1500]  PE: Abd-soft  Lab Results:   Basename 09/17/11 0345 09/16/11 0036  WBC 4.0 6.3  HGB 9.6* 9.6*  HCT 30.0* 29.5*  PLT 245 227   BMET  Basename 09/17/11 0345 09/16/11 0036  NA 134* 133*  K 4.0 3.7  CL 102 101  CO2 22 22  GLUCOSE 155* 55*  BUN 22 26*  CREATININE 1.65* 1.74*  CALCIUM 8.6 8.3*   PT/INR No results found for this basename: LABPROT:2,INR:2 in the last 72 hours Comprehensive Metabolic Panel:    Component Value Date/Time   NA 134* 09/17/2011 0345   K 4.0 09/17/2011 0345   CL 102 09/17/2011 0345   CO2 22 09/17/2011 0345   BUN 22 09/17/2011 0345   CREATININE 1.65* 09/17/2011 0345   GLUCOSE 155* 09/17/2011 0345   CALCIUM 8.6 09/17/2011 0345   AST 122* 09/16/2011 0036   ALT 142* 09/16/2011 0036   ALKPHOS 237* 09/16/2011 0036   BILITOT 5.1* 09/16/2011 0036   PROT 6.0 09/16/2011 0036   ALBUMIN 2.6* 09/16/2011 0036     Studies/Results: No results found.  Anti-infectives: Anti-infectives     Start     Dose/Rate Route Frequency Ordered Stop   09/15/11 0600   piperacillin-tazobactam (ZOSYN) IVPB 3.375 g        3.375 g 12.5 mL/hr over 240 Minutes Intravenous 3 times per day 09/15/11 0041     09/14/11 1830   vancomycin (VANCOCIN) IVPB 1000 mg/200 mL premix        1,000 mg 200 mL/hr over 60 Minutes Intravenous  Once  09/14/11 1807 09/14/11 2002   09/14/11 1830   piperacillin-tazobactam (ZOSYN) IVPB 3.375 g        3.375 g 12.5 mL/hr over 240 Minutes Intravenous  Once 09/14/11 1807 09/15/11 0112          Assessment Principal Problem:  *Sepsis due to cholangitis-improved. Active Problems:  Cholelithiasis  Choledocholithiasis    LOS: 3 days   Plan:  IR consult to see if PTC can be done.  If not, will need open cholecystectomy with CBDE.   Angelly Spearing J 09/17/2011

## 2011-09-17 NOTE — Progress Notes (Signed)
PT Cancellation Note  Treatment cancelled today due to patient is on "strict bedrest" per orders.  In order for PT to complete evaluation he will need to have updated activity orders.  Spoke with RN who is going to try to speak with MD during rounds this morning.  Lurena Joiner B. Ty Oshima, PT, DPT [phone] 09/17/2011, 10:17 AM

## 2011-09-17 NOTE — Progress Notes (Signed)
Spoke with Dr. Fredia Sorrow earlier this morning and on his view of the studies, the findings are more concerning for acute cholecystitis with a possibility of choledocholithiasis.  We decided to proceed with a percutaneous cholecystostomy at this time then attempt a cholangiogram through it at a later time.  I explained this to Frank Ashley over the phone.

## 2011-09-17 NOTE — Interval H&P Note (Cosign Needed)
History and Physical Interval Note:  09/17/2011 9:21 AM  Frank Ashley  Is tent scheduled for percutaneous cholecystostomy today . The various methods of treatment have been discussed with the patient and family. After consideration of risks, benefits and other options for treatment, the patient has consented to the above procedure. .  The patients' history has been reviewed, imaging studies reviewed by Dr. Fredia Sorrow, case d/w Dr. Abbey Chatters, patient examined, no change in status, stable for the above procedure .  I have reviewed the patients' chart and labs.  Questions were answered to the patient's satisfaction.   Past Medical History  Diagnosis Date  . Allergy   . Diabetes mellitus     Type 2  . GERD (gastroesophageal reflux disease)   . Hyperlipidemia   . Peripheral neuropathy     peripheral neuropathy  . Renal insufficiency   . Diabetic foot ulcers   . Overweight   . Sleep apnea     CPAP  . Pancreatitis 02/2007    ACUTE  . MRSA (methicillin resistant Staphylococcus aureus)   . CAD (coronary artery disease) ~1997    S/p stenting but denies MI   Past Surgical History  Procedure Date  . Coronary angioplasty with stent placement   . Penile prosthesis implant     later removed  . Partial gastrectomy 1973   Ct Abdomen Pelvis W Wo Contrast  09/14/2011  *RADIOLOGY REPORT*  Clinical Data: Right upper quadrant abdominal pain.  History gallstones and pancreatitis.  CT ABDOMEN AND PELVIS WITHOUT AND WITH CONTRAST  Technique:  Multidetector CT imaging of the abdomen and pelvis was performed without contrast material in one or both body regions, followed by contrast material(s) and further sections in one or both body regions.  Contrast: 80mL OMNIPAQUE IOHEXOL 300 MG/ML  SOLN  Comparison: 09/14/2011 ultrasound; CT scan from 02/05/2007  Findings: Punctate calcifications in the liver and spleen are probably from old granulomatous disease.  There is mild atelectasis in the lingula, left lower  lobe, and posterior basal segment right lower lobe.  Several punctate calcifications present in the pancreatic body and head.  Mild peripancreatic stranding noted, particularly in the pancreatic head region.  Dependent gallstones noted in the gallbladder.  In the common bile duct on image 48 of series 3 and also probably in the vicinity of the ampulla, focal high densities favor choledocholithiasis.  Pancreatic atrophy noted.  Porta hepatis nodes measure up to 10 mm in short axis.  Small peripancreatic lymph nodes are also present, measuring up to about 10 mm in short axis.  There is an indistinct 3.4 x 2.7 cm region of hypodensity inferiorly in the lateral segment left hepatic lobe which is technically nonspecific and visible precontrast and postcontrast images.  This may reflect focal fatty infiltration although underlying lesion is difficult to exclude.  No definite lesion was visible on this position on the prior exam from 2008.  Left adrenal gland mass measures 1.8 x 1.6 cm, technically nonspecific based on the imaging characteristics.  Atherosclerotic calcification of the abdominal aorta is present.  Abnormal exophytic lesion of the left kidney lower pole is enhancing and suspicious for a renal cell carcinoma.  No tumor thrombus in the left renal vein is observed.  This mass may measure up to 5 cm in diameter.  Lumbar spondylosis and degenerative disc disease noted, with right foraminal stenosis at L5-S1.  The urinary bladder appears unremarkable.  IMPRESSION:  1.  Suspected choledocholithiasis with several stones in the common bile duct  as a probable cause for the biliary dilatation. 2.  Exophytic enhancing mass in the right kidney lower pole, approximately 5 cm in diameter, probably a renal cell carcinoma. 3.  Indistinct low density lesion inferiorly in the lateral segment left hepatic lobe may represent fatty infiltration given the geographic appearance, but is essentially nonspecific.  MRI characterization  is recommended using dynamic hepatic protocol with feasible. 4.  Nonspecific small left adrenal mass. 5.  Cholelithiasis. 6.  Old granulomatous disease. 7.  Mild findings of chronic pancreatitis.  Pancreatic atrophy. 8.  Mild peripancreatic stranding especially along the pancreatic head - pancreatitis is not excluded.  No acute fluid collections noted. 9.  Atherosclerosis. 10.  Lumbar spondylosis and degenerative disc disease, with right foraminal stenosis at L5-S1.  Original Report Authenticated By: Dellia Cloud, M.D.   US Abdomen Complete  09/14/2011  *RADIOLOGY REPORT*  Clinical Data:  Abdominal pain.  ABDOMINAL ULTRASOUND COMPLETE  Comparison:  None.  Findings:  Gallbladder:  Multiple small less than 1 cm gallstones are seen. No evidence of gallbladder dilatation wall thickening.  Common Bile Duct:  Dilated measuring 9 mm.  Intrahepatic bile ducts also appear dilated in the left hepatic lobe.  Liver: Coarse increased echotexture, consistent with steatosis or cirrhosis.  No focal liver mass identified.  IVC:  Appears normal.  Pancreas:  Dilated pancreatic duct seen in the pancreatic body measuring 7 mm.  Pancreatic head not visualized.  Spleen:  Within normal limits in size and echotexture.  Right kidney:  Normal in size and parenchymal echogenicity.  No evidence of mass or hydronephrosis.  Left kidney:  Normal in size and parenchymal echogenicity.  No evidence of mass or hydronephrosis.  Abdominal Aorta:  No aneurysm identified.  IMPRESSION:  1.  Cholelithiasis.  No sonographic signs of acute cholecystitis. 2.  Biliary and pancreatic ductal dilatation.  Etiology not visualized by ultrasound.  Pancreatic head mass or choledocholithiasis cannot be excluded.  Consider abdomen CT without and with contrast (pancreatic mass protocol) for further evaluation. 3.  Hepatic steatosis or cirrhosis.  Original Report Authenticated By: Danae Orleans, M.D.  Results for orders placed during the hospital encounter of  09/14/11  URINALYSIS, ROUTINE W REFLEX MICROSCOPIC      Component Value Range   Color, Urine AMBER (*) YELLOW    APPearance CLEAR  CLEAR    Specific Gravity, Urine 1.024  1.005 - 1.030    pH 6.5  5.0 - 8.0    Glucose, UA >1000 (*) NEGATIVE (mg/dL)   Hgb urine dipstick NEGATIVE  NEGATIVE    Bilirubin Urine LARGE (*) NEGATIVE    Ketones, ur TRACE (*) NEGATIVE (mg/dL)   Protein, ur 30 (*) NEGATIVE (mg/dL)   Urobilinogen, UA 1.0  0.0 - 1.0 (mg/dL)   Nitrite NEGATIVE  NEGATIVE    Leukocytes, UA NEGATIVE  NEGATIVE   URINE MICROSCOPIC-ADD ON      Component Value Range   Squamous Epithelial / LPF RARE  RARE   CBC      Component Value Range   WBC 7.4  4.0 - 10.5 (K/uL)   RBC 4.86  4.22 - 5.81 (MIL/uL)   Hemoglobin 12.0 (*) 13.0 - 17.0 (g/dL)   HCT 40.9 (*) 81.1 - 52.0 (%)   MCV 75.3 (*) 78.0 - 100.0 (fL)   MCH 24.7 (*) 26.0 - 34.0 (pg)   MCHC 32.8  30.0 - 36.0 (g/dL)   RDW 91.4 (*) 78.2 - 15.5 (%)   Platelets 229  150 - 400 (K/uL)  DIFFERENTIAL      Component Value Range   Neutrophils Relative 92 (*) 43 - 77 (%)   Neutro Abs 6.9  1.7 - 7.7 (K/uL)   Lymphocytes Relative 4 (*) 12 - 46 (%)   Lymphs Abs 0.3 (*) 0.7 - 4.0 (K/uL)   Monocytes Relative 4  3 - 12 (%)   Monocytes Absolute 0.3  0.1 - 1.0 (K/uL)   Eosinophils Relative 0  0 - 5 (%)   Eosinophils Absolute 0.0  0.0 - 0.7 (K/uL)   Basophils Relative 0  0 - 1 (%)   Basophils Absolute 0.0  0.0 - 0.1 (K/uL)  COMPREHENSIVE METABOLIC PANEL      Component Value Range   Sodium 131 (*) 135 - 145 (mEq/L)   Potassium 4.3  3.5 - 5.1 (mEq/L)   Chloride 98  96 - 112 (mEq/L)   CO2 20  19 - 32 (mEq/L)   Glucose, Bld 266 (*) 70 - 99 (mg/dL)   BUN 22  6 - 23 (mg/dL)   Creatinine, Ser 0.98 (*) 0.50 - 1.35 (mg/dL)   Calcium 9.0  8.4 - 11.9 (mg/dL)   Total Protein 7.4  6.0 - 8.3 (g/dL)   Albumin 3.4 (*) 3.5 - 5.2 (g/dL)   AST 147 (*) 0 - 37 (U/L)   ALT 232 (*) 0 - 53 (U/L)   Alkaline Phosphatase 240 (*) 39 - 117 (U/L)   Total Bilirubin  5.8 (*) 0.3 - 1.2 (mg/dL)   GFR calc non Af Amer 39 (*) >90 (mL/min)   GFR calc Af Amer 45 (*) >90 (mL/min)  LIPASE, BLOOD      Component Value Range   Lipase 91 (*) 11 - 59 (U/L)  LACTIC ACID, PLASMA      Component Value Range   Lactic Acid, Venous 3.7 (*) 0.5 - 2.2 (mmol/L)  CULTURE, BLOOD (ROUTINE X 2)      Component Value Range   Specimen Description BLOOD RIGHT HAND     Special Requests BOTTLES DRAWN AEROBIC ONLY 5 CC     Culture  Setup Time [id number]     Culture       Value:        BLOOD CULTURE RECEIVED NO GROWTH TO DATE CULTURE WILL BE HELD FOR 5 DAYS BEFORE ISSUING A FINAL NEGATIVE REPORT   Report Status PENDING    CULTURE, BLOOD (ROUTINE X 2)      Component Value Range   Specimen Description BLOOD RIGHT ANTECUBITAL     Special Requests BOTTLES DRAWN AEROBIC AND ANAEROBIC 5CC EACH     Culture  Setup Time [id number]     Culture       Value: ESCHERICHIA COLI     Note: Gram Stain Report Called to,Read Back By and Verified With: VEE GRAVES @ 2135 ON 09/15/2011 HAJAM   Report Status 09/17/2011 FINAL     Organism ID, Bacteria ESCHERICHIA COLI    MRSA PCR SCREENING      Component Value Range   MRSA by PCR NEGATIVE  NEGATIVE   COMPREHENSIVE METABOLIC PANEL      Component Value Range   Sodium 133 (*) 135 - 145 (mEq/L)   Potassium 4.0  3.5 - 5.1 (mEq/L)   Chloride 103  96 - 112 (mEq/L)   CO2 19  19 - 32 (mEq/L)   Glucose, Bld 103 (*) 70 - 99 (mg/dL)   BUN 27 (*) 6 - 23 (mg/dL)   Creatinine, Ser 1.32 (*) 0.50 - 1.35 (mg/dL)  Calcium 7.8 (*) 8.4 - 10.5 (mg/dL)   Total Protein 5.6 (*) 6.0 - 8.3 (g/dL)   Albumin 2.5 (*) 3.5 - 5.2 (g/dL)   AST 409 (*) 0 - 37 (U/L)   ALT 162 (*) 0 - 53 (U/L)   Alkaline Phosphatase 188 (*) 39 - 117 (U/L)   Total Bilirubin 4.1 (*) 0.3 - 1.2 (mg/dL)   GFR calc non Af Amer 29 (*) >90 (mL/min)   GFR calc Af Amer 34 (*) >90 (mL/min)  CBC      Component Value Range   WBC 10.7 (*) 4.0 - 10.5 (K/uL)   RBC 3.92 (*) 4.22 - 5.81 (MIL/uL)    Hemoglobin 9.4 (*) 13.0 - 17.0 (g/dL)   HCT 81.1 (*) 91.4 - 52.0 (%)   MCV 74.7 (*) 78.0 - 100.0 (fL)   MCH 24.0 (*) 26.0 - 34.0 (pg)   MCHC 32.1  30.0 - 36.0 (g/dL)   RDW 78.2 (*) 95.6 - 15.5 (%)   Platelets 202  150 - 400 (K/uL)  LACTIC ACID, PLASMA      Component Value Range   Lactic Acid, Venous 0.9  0.5 - 2.2 (mmol/L)  MAGNESIUM      Component Value Range   Magnesium 1.6  1.5 - 2.5 (mg/dL)  PHOSPHORUS      Component Value Range   Phosphorus 2.5  2.3 - 4.6 (mg/dL)  GLUCOSE, CAPILLARY      Component Value Range   Glucose-Capillary 124 (*) 70 - 99 (mg/dL)   Comment 1 Documented in Chart     Comment 2 Notify RN    GLUCOSE, CAPILLARY      Component Value Range   Glucose-Capillary 127 (*) 70 - 99 (mg/dL)   Comment 1 Documented in Chart     Comment 2 Notify RN    COMPREHENSIVE METABOLIC PANEL      Component Value Range   Sodium 133 (*) 135 - 145 (mEq/L)   Potassium 3.7  3.5 - 5.1 (mEq/L)   Chloride 101  96 - 112 (mEq/L)   CO2 22  19 - 32 (mEq/L)   Glucose, Bld 55 (*) 70 - 99 (mg/dL)   BUN 26 (*) 6 - 23 (mg/dL)   Creatinine, Ser 2.13 (*) 0.50 - 1.35 (mg/dL)   Calcium 8.3 (*) 8.4 - 10.5 (mg/dL)   Total Protein 6.0  6.0 - 8.3 (g/dL)   Albumin 2.6 (*) 3.5 - 5.2 (g/dL)   AST 086 (*) 0 - 37 (U/L)   ALT 142 (*) 0 - 53 (U/L)   Alkaline Phosphatase 237 (*) 39 - 117 (U/L)   Total Bilirubin 5.1 (*) 0.3 - 1.2 (mg/dL)   GFR calc non Af Amer 38 (*) >90 (mL/min)   GFR calc Af Amer 44 (*) >90 (mL/min)  DIFFERENTIAL      Component Value Range   Neutrophils Relative 65  43 - 77 (%)   Neutro Abs 4.1  1.7 - 7.7 (K/uL)   Lymphocytes Relative 17  12 - 46 (%)   Lymphs Abs 1.1  0.7 - 4.0 (K/uL)   Monocytes Relative 13 (*) 3 - 12 (%)   Monocytes Absolute 0.8  0.1 - 1.0 (K/uL)   Eosinophils Relative 5  0 - 5 (%)   Eosinophils Absolute 0.3  0.0 - 0.7 (K/uL)   Basophils Relative 0  0 - 1 (%)   Basophils Absolute 0.0  0.0 - 0.1 (K/uL)  LIPASE, BLOOD      Component Value Range   Lipase  37   11 - 59 (U/L)  CBC      Component Value Range   WBC 6.3  4.0 - 10.5 (K/uL)   RBC 3.96 (*) 4.22 - 5.81 (MIL/uL)   Hemoglobin 9.6 (*) 13.0 - 17.0 (g/dL)   HCT 16.1 (*) 09.6 - 52.0 (%)   MCV 74.5 (*) 78.0 - 100.0 (fL)   MCH 24.2 (*) 26.0 - 34.0 (pg)   MCHC 32.5  30.0 - 36.0 (g/dL)   RDW 04.5 (*) 40.9 - 15.5 (%)   Platelets 227  150 - 400 (K/uL)  GLUCOSE, CAPILLARY      Component Value Range   Glucose-Capillary 238 (*) 70 - 99 (mg/dL)   Comment 1 Documented in Chart     Comment 2 Notify RN    GLUCOSE, CAPILLARY      Component Value Range   Glucose-Capillary 123 (*) 70 - 99 (mg/dL)   Comment 1 Notify RN    GLUCOSE, CAPILLARY      Component Value Range   Glucose-Capillary 64 (*) 70 - 99 (mg/dL)   Comment 1 Documented in Chart     Comment 2 Notify RN    GLUCOSE, CAPILLARY      Component Value Range   Glucose-Capillary 66 (*) 70 - 99 (mg/dL)   Comment 1 Documented in Chart     Comment 2 Notify RN    GLUCOSE, CAPILLARY      Component Value Range   Glucose-Capillary 118 (*) 70 - 99 (mg/dL)  GLUCOSE, CAPILLARY      Component Value Range   Glucose-Capillary 129 (*) 70 - 99 (mg/dL)  GLUCOSE, CAPILLARY      Component Value Range   Glucose-Capillary 142 (*) 70 - 99 (mg/dL)  BASIC METABOLIC PANEL      Component Value Range   Sodium 134 (*) 135 - 145 (mEq/L)   Potassium 4.0  3.5 - 5.1 (mEq/L)   Chloride 102  96 - 112 (mEq/L)   CO2 22  19 - 32 (mEq/L)   Glucose, Bld 155 (*) 70 - 99 (mg/dL)   BUN 22  6 - 23 (mg/dL)   Creatinine, Ser 8.11 (*) 0.50 - 1.35 (mg/dL)   Calcium 8.6  8.4 - 91.4 (mg/dL)   GFR calc non Af Amer 40 (*) >90 (mL/min)   GFR calc Af Amer 47 (*) >90 (mL/min)  CBC      Component Value Range   WBC 4.0  4.0 - 10.5 (K/uL)   RBC 4.01 (*) 4.22 - 5.81 (MIL/uL)   Hemoglobin 9.6 (*) 13.0 - 17.0 (g/dL)   HCT 78.2 (*) 95.6 - 52.0 (%)   MCV 74.8 (*) 78.0 - 100.0 (fL)   MCH 23.9 (*) 26.0 - 34.0 (pg)   MCHC 32.0  30.0 - 36.0 (g/dL)   RDW 21.3 (*) 08.6 - 15.5 (%)    Platelets 245  150 - 400 (K/uL)  DIFFERENTIAL      Component Value Range   Neutrophils Relative 55  43 - 77 (%)   Neutro Abs 2.2  1.7 - 7.7 (K/uL)   Lymphocytes Relative 23  12 - 46 (%)   Lymphs Abs 0.9  0.7 - 4.0 (K/uL)   Monocytes Relative 11  3 - 12 (%)   Monocytes Absolute 0.5  0.1 - 1.0 (K/uL)   Eosinophils Relative 10 (*) 0 - 5 (%)   Eosinophils Absolute 0.4  0.0 - 0.7 (K/uL)   Basophils Relative 1  0 - 1 (%)   Basophils Absolute  0.0  0.0 - 0.1 (K/uL)  GLUCOSE, CAPILLARY      Component Value Range   Glucose-Capillary 150 (*) 70 - 99 (mg/dL)  GLUCOSE, CAPILLARY      Component Value Range   Glucose-Capillary 162 (*) 70 - 99 (mg/dL)  HEPATIC FUNCTION PANEL      Component Value Range   Total Protein 6.1  6.0 - 8.3 (g/dL)   Albumin 2.5 (*) 3.5 - 5.2 (g/dL)   AST 454 (*) 0 - 37 (U/L)   ALT 125 (*) 0 - 53 (U/L)   Alkaline Phosphatase 352 (*) 39 - 117 (U/L)   Total Bilirubin 5.4 (*) 0.3 - 1.2 (mg/dL)   Bilirubin, Direct 4.2 (*) 0.0 - 0.3 (mg/dL)   Indirect Bilirubin 1.2 (*) 0.3 - 0.9 (mg/dL)  TYPE AND SCREEN      Component Value Range   ABO/RH(D) B POS     Antibody Screen NEG     Sample Expiration 09/20/2011    ABO/RH      Component Value Range   ABO/RH(D) B POS    GLUCOSE, CAPILLARY      Component Value Range   Glucose-Capillary 136 (*) 70 - 99 (mg/dL)   Comment 1 Documented in Chart     Comment 2 Notify RN       Chinita Pester

## 2011-09-17 NOTE — Procedures (Signed)
Procedure:  Percutaneous cholecystostomy Findings:  Distended gallbladder.  Bile turbid and foul smelling, likely infected.  Sample sent for culture.  10 Fr drain placed in GB to gravity bag drainage.

## 2011-09-18 DIAGNOSIS — R7881 Bacteremia: Secondary | ICD-10-CM | POA: Diagnosis present

## 2011-09-18 DIAGNOSIS — R1900 Intra-abdominal and pelvic swelling, mass and lump, unspecified site: Secondary | ICD-10-CM

## 2011-09-18 DIAGNOSIS — E1165 Type 2 diabetes mellitus with hyperglycemia: Secondary | ICD-10-CM

## 2011-09-18 DIAGNOSIS — E118 Type 2 diabetes mellitus with unspecified complications: Secondary | ICD-10-CM

## 2011-09-18 DIAGNOSIS — K8309 Other cholangitis: Secondary | ICD-10-CM

## 2011-09-18 LAB — GLUCOSE, CAPILLARY
Glucose-Capillary: 201 mg/dL — ABNORMAL HIGH (ref 70–99)
Glucose-Capillary: 213 mg/dL — ABNORMAL HIGH (ref 70–99)
Glucose-Capillary: 222 mg/dL — ABNORMAL HIGH (ref 70–99)

## 2011-09-18 LAB — COMPREHENSIVE METABOLIC PANEL
BUN: 21 mg/dL (ref 6–23)
CO2: 19 mEq/L (ref 19–32)
Chloride: 98 mEq/L (ref 96–112)
Creatinine, Ser: 1.55 mg/dL — ABNORMAL HIGH (ref 0.50–1.35)
GFR calc Af Amer: 51 mL/min — ABNORMAL LOW (ref >90)
GFR calc non Af Amer: 44 mL/min — ABNORMAL LOW (ref >90)
Total Bilirubin: 3.8 mg/dL — ABNORMAL HIGH (ref 0.3–1.2)

## 2011-09-18 MED ORDER — INSULIN ASPART 100 UNIT/ML ~~LOC~~ SOLN
0.0000 [IU] | Freq: Three times a day (TID) | SUBCUTANEOUS | Status: DC
  Administered 2011-09-18: 3 [IU] via SUBCUTANEOUS
  Administered 2011-09-18 (×2): 2 [IU] via SUBCUTANEOUS
  Administered 2011-09-19 (×3): 3 [IU] via SUBCUTANEOUS
  Administered 2011-09-21: 5 [IU] via SUBCUTANEOUS

## 2011-09-18 MED ORDER — HYDROMORPHONE HCL PF 2 MG/ML IJ SOLN
2.0000 mg | Freq: Once | INTRAMUSCULAR | Status: AC
  Administered 2011-09-18: 2 mg via INTRAVENOUS
  Filled 2011-09-18: qty 1

## 2011-09-18 MED ORDER — PANTOPRAZOLE SODIUM 40 MG PO TBEC
40.0000 mg | DELAYED_RELEASE_TABLET | Freq: Every day | ORAL | Status: DC
  Administered 2011-09-19 – 2011-09-20 (×2): 40 mg via ORAL
  Filled 2011-09-18 (×4): qty 1

## 2011-09-18 MED ORDER — SODIUM CHLORIDE 0.9 % IV SOLN
INTRAVENOUS | Status: DC
  Administered 2011-09-18: 1000 mL via INTRAVENOUS

## 2011-09-18 NOTE — Progress Notes (Signed)
ANTIBIOTIC CONSULT NOTE - FOLLOW UP  Pharmacy Consult for Zosyn Indication: Sepsis  Allergies  Allergen Reactions  . Hydrocodone   . Sulfamethoxazole     REACTION: hives    Patient Measurements: Height: 6' (182.9 cm) Weight: 279 lb (126.554 kg) IBW/kg (Calculated) : 77.6   Vital Signs: Temp: 98.1 F (36.7 C) (05/18 1100) Temp src: Oral (05/18 1100) BP: 110/66 mmHg (05/18 1100) Pulse Rate: 87  (05/18 1100) Intake/Output from previous day: 05/17 0701 - 05/18 0700 In: 987.5 [I.V.:850; IV Piggyback:137.5] Out: 2345 [Urine:1675; Drains:670] Intake/Output from this shift: Total I/O In: 1005 [P.O.:680; I.V.:300; IV Piggyback:25] Out: 100 [Drains:100]  Labs:  Basename 09/18/11 0348 09/17/11 0345 09/16/11 0036  WBC -- 4.0 6.3  HGB -- 9.6* 9.6*  PLT -- 245 227  LABCREA -- -- --  CREATININE 1.55* 1.65* 1.74*   Estimated Creatinine Clearance: 61 ml/min (by C-G formula based on Cr of 1.55). No results found for this basename: VANCOTROUGH:2,VANCOPEAK:2,VANCORANDOM:2,GENTTROUGH:2,GENTPEAK:2,GENTRANDOM:2,TOBRATROUGH:2,TOBRAPEAK:2,TOBRARND:2,AMIKACINPEAK:2,AMIKACINTROU:2,AMIKACIN:2, in the last 72 hours   Microbiology: Recent Results (from the past 720 hour(s))  CULTURE, BLOOD (ROUTINE X 2)     Status: Normal (Preliminary result)   Collection Time   09/14/11  5:07 PM      Component Value Range Status Comment   Specimen Description BLOOD RIGHT HAND   Final    Special Requests BOTTLES DRAWN AEROBIC ONLY 5 CC   Final    Culture  Setup Time [id number]   Final    Culture     Final    Value:        BLOOD CULTURE RECEIVED NO GROWTH TO DATE CULTURE WILL BE HELD FOR 5 DAYS BEFORE ISSUING A FINAL NEGATIVE REPORT   Report Status PENDING   Incomplete   CULTURE, BLOOD (ROUTINE X 2)     Status: Normal   Collection Time   09/14/11  5:07 PM      Component Value Range Status Comment   Specimen Description BLOOD RIGHT ANTECUBITAL   Final    Special Requests BOTTLES DRAWN AEROBIC AND  ANAEROBIC [facility]   Final    Culture  Setup Time [id number]   Final    Culture     Final    Value: ESCHERICHIA COLI     Note: Gram Stain Report Called to,Read Back By and Verified With: VEE GRAVES @ 2135 ON 09/15/2011 HAJAM   Report Status 09/17/2011 FINAL   Final    Organism ID, Bacteria ESCHERICHIA COLI   Final   MRSA PCR SCREENING     Status: Normal   Collection Time   09/15/11 12:10 AM      Component Value Range Status Comment   MRSA by PCR NEGATIVE  NEGATIVE  Final   CULTURE, ROUTINE-ABSCESS     Status: Normal (Preliminary result)   Collection Time   09/17/11  4:25 PM      Component Value Range Status Comment   Specimen Description GALL BLADDER   Final    Special Requests Normal   Final    Gram Stain     Final    Value: NO WBC SEEN     NO SQUAMOUS EPITHELIAL CELLS SEEN     NO ORGANISMS SEEN   Culture Culture reincubated for better growth   Final    Report Status PENDING   Incomplete     Anti-infectives     Start     Dose/Rate Route Frequency Ordered Stop   09/15/11 0600  piperacillin-tazobactam (ZOSYN) IVPB 3.375 g  3.375 g 12.5 mL/hr over 240 Minutes Intravenous 3 times per day 09/15/11 0041     09/14/11 1830   vancomycin (VANCOCIN) IVPB 1000 mg/200 mL premix        1,000 mg 200 mL/hr over 60 Minutes Intravenous  Once 09/14/11 1807 09/14/11 2002   09/14/11 1830  piperacillin-tazobactam (ZOSYN) IVPB 3.375 g       3.375 g 12.5 mL/hr over 240 Minutes Intravenous  Once 09/14/11 1807 09/15/11 0112          Assessment: 71 yo M on Day # 5 Zosyn EI for sepsis secondary to gallbladder infection. Cx growing E.coli (sens to Zosyn). Dose remains appropriate for renal function.  MD note indicates patient is improving.  Plan:  Continue current Zosyn regimen.  Darrol Angel, PharmD Pager: [phone] 09/18/2011,12:52 PM

## 2011-09-18 NOTE — Progress Notes (Signed)
Subjective: Pt is status post biliary drain placement. Patient doing well but complaining of pain around the area to drain. He still appears somewhat)  Interval History: Pt had percutaneous drain placed by IR yestereday. BC showed e.coli.  Objective: Filed Vitals:   09/17/11 1900 09/17/11 2000 09/18/11 0000 09/18/11 0400  BP: 157/64  140/50 109/71  Pulse: 80  98 85  Temp:  98.1 F (36.7 C) 99.8 F (37.7 C) 99.5 F (37.5 C)  TempSrc:  Oral Oral Oral  Resp: 19  17 16   Height:      Weight:    126.8 kg (279 lb 8.7 oz)  SpO2: 100%  92% 94%   Weight change: -4.8 kg (-10 lb 9.3 oz)  Intake/Output Summary (Last 24 hours) at 09/18/11 0740 Last data filed at 09/18/11 0700  Gross per 24 hour  Intake  987.5 ml  Output   2345 ml  Net -1357.5 ml    General: Alert, awake, oriented x3, in no acute distress. Mild icterus HEENT: [place]/AT [place]ER[place], EOMI, Increased icterus. Neck: Trachea midline,  no masses, no thyromegal,y no JVD, no carotid bruit OROPHARYNX:  Moist, No exudate/ erythema/lesions.  Heart: Regular rate and rhythm, without murmurs, rubs, gallops, PMI non-displaced, no heaves or thrills on palpation.  Lungs: Clear to auscultation, no wheezing or rhonchi noted. No increased vocal fremitus resonant to percussion  Abdomen: Soft, mild right upper quadrant tenderness around the site of the drain.  Mildly distended, positive bowel sounds, no masses no hepatosplenomegaly noted..   Lab Results:  Basename 09/18/11 0348 09/17/11 0345  NA 129* 134*  K 4.1 4.0  CL 98 102  CO2 19 22  GLUCOSE 231* 155*  BUN 21 22  CREATININE 1.55* 1.65*  CALCIUM 8.6 8.6  MG -- --  PHOS -- --    Basename 09/18/11 0348 09/17/11 0645  AST 46* 100*  ALT 93* 125*  ALKPHOS 314* 352*  BILITOT 3.8* 5.4*  PROT 6.2 6.1  ALBUMIN 2.5* 2.5*    Basename 09/16/11 0036  LIPASE 37  AMYLASE --    Basename 09/17/11 0345 09/16/11 0036  WBC 4.0 6.3  NEUTROABS 2.2 4.1  HGB 9.6* 9.6*  HCT 30.0* 29.5*  MCV  74.8* 74.5*  PLT 245 227   No results found for this basename: CKTOTAL:3,CKMB:3,CKMBINDEX:3,TROPONINI:3 in the last 72 hours No components found with this basename: POCBNP:3 No results found for this basename: DDIMER:2 in the last 72 hours No results found for this basename: HGBA1C:2 in the last 72 hours No results found for this basename: CHOL:2,HDL:2,LDLCALC:2,TRIG:2,CHOLHDL:2,LDLDIRECT:2 in the last 72 hours No results found for this basename: TSH,T4TOTAL,FREET3,T3FREE,THYROIDAB in the last 72 hours No results found for this basename: VITAMINB12:2,FOLATE:2,FERRITIN:2,TIBC:2,IRON:2,RETICCTPCT:2 in the last 72 hours  Micro Results: Recent Results (from the past 240 hour(s))  CULTURE, BLOOD (ROUTINE X 2)     Status: Normal (Preliminary result)   Collection Time   09/14/11  5:07 PM      Component Value Range Status Comment   Specimen Description BLOOD RIGHT HAND   Final    Special Requests BOTTLES DRAWN AEROBIC ONLY 5 CC   Final    Culture  Setup Time [id number]   Final    Culture     Final    Value:        BLOOD CULTURE RECEIVED NO GROWTH TO DATE CULTURE WILL BE HELD FOR 5 DAYS BEFORE ISSUING A FINAL NEGATIVE REPORT   Report Status PENDING   Incomplete   CULTURE, BLOOD (ROUTINE X 2)  Status: Normal   Collection Time   09/14/11  5:07 PM      Component Value Range Status Comment   Specimen Description BLOOD RIGHT ANTECUBITAL   Final    Special Requests BOTTLES DRAWN AEROBIC AND ANAEROBIC [facility]   Final    Culture  Setup Time [id number]   Final    Culture     Final    Value: ESCHERICHIA COLI     Note: Gram Stain Report Called to,Read Back By and Verified With: VEE GRAVES @ 2135 ON 09/15/2011 HAJAM   Report Status 09/17/2011 FINAL   Final    Organism ID, Bacteria ESCHERICHIA COLI   Final   MRSA PCR SCREENING     Status: Normal   Collection Time   09/15/11 12:10 AM      Component Value Range Status Comment   MRSA by PCR NEGATIVE  NEGATIVE  Final   CULTURE, ROUTINE-ABSCESS      Status: Normal (Preliminary result)   Collection Time   09/17/11  4:25 PM      Component Value Range Status Comment   Specimen Description GALL BLADDER   Final    Special Requests Normal   Final    Gram Stain     Final    Value: NO WBC SEEN     NO SQUAMOUS EPITHELIAL CELLS SEEN     NO ORGANISMS SEEN   Culture Culture reincubated for better growth   Final    Report Status PENDING   Incomplete     Studies/Results: Ct Abdomen Pelvis W Wo Contrast  09/14/2011  *RADIOLOGY REPORT*  Clinical Data: Right upper quadrant abdominal pain.  History gallstones and pancreatitis.  CT ABDOMEN AND PELVIS WITHOUT AND WITH CONTRAST  Technique:  Multidetector CT imaging of the abdomen and pelvis was performed without contrast material in one or both body regions, followed by contrast material(s) and further sections in one or both body regions.  Contrast: 80mL OMNIPAQUE IOHEXOL 300 MG/ML  SOLN  Comparison: 09/14/2011 ultrasound; CT scan from 02/05/2007  Findings: Punctate calcifications in the liver and spleen are probably from old granulomatous disease.  There is mild atelectasis in the lingula, left lower lobe, and posterior basal segment right lower lobe.  Several punctate calcifications present in the pancreatic body and head.  Mild peripancreatic stranding noted, particularly in the pancreatic head region.  Dependent gallstones noted in the gallbladder.  In the common bile duct on image 48 of series 3 and also probably in the vicinity of the ampulla, focal high densities favor choledocholithiasis.  Pancreatic atrophy noted.  Porta hepatis nodes measure up to 10 mm in short axis.  Small peripancreatic lymph nodes are also present, measuring up to about 10 mm in short axis.  There is an indistinct 3.4 x 2.7 cm region of hypodensity inferiorly in the lateral segment left hepatic lobe which is technically nonspecific and visible precontrast and postcontrast images.  This may reflect focal fatty infiltration although  underlying lesion is difficult to exclude.  No definite lesion was visible on this position on the prior exam from 2008.  Left adrenal gland mass measures 1.8 x 1.6 cm, technically nonspecific based on the imaging characteristics.  Atherosclerotic calcification of the abdominal aorta is present.  Abnormal exophytic lesion of the left kidney lower pole is enhancing and suspicious for a renal cell carcinoma.  No tumor thrombus in the left renal vein is observed.  This mass may measure up to 5 cm in diameter.  Lumbar spondylosis  and degenerative disc disease noted, with right foraminal stenosis at L5-S1.  The urinary bladder appears unremarkable.  IMPRESSION:  1.  Suspected choledocholithiasis with several stones in the common bile duct as a probable cause for the biliary dilatation. 2.  Exophytic enhancing mass in the right kidney lower pole, approximately 5 cm in diameter, probably a renal cell carcinoma. 3.  Indistinct low density lesion inferiorly in the lateral segment left hepatic lobe may represent fatty infiltration given the geographic appearance, but is essentially nonspecific.  MRI characterization is recommended using dynamic hepatic protocol with feasible. 4.  Nonspecific small left adrenal mass. 5.  Cholelithiasis. 6.  Old granulomatous disease. 7.  Mild findings of chronic pancreatitis.  Pancreatic atrophy. 8.  Mild peripancreatic stranding especially along the pancreatic head - pancreatitis is not excluded.  No acute fluid collections noted. 9.  Atherosclerosis. 10.  Lumbar spondylosis and degenerative disc disease, with right foraminal stenosis at L5-S1.  Original Report Authenticated By: Dellia Cloud, M.D.   US Abdomen Complete  09/14/2011  *RADIOLOGY REPORT*  Clinical Data:  Abdominal pain.  ABDOMINAL ULTRASOUND COMPLETE  Comparison:  None.  Findings:  Gallbladder:  Multiple small less than 1 cm gallstones are seen. No evidence of gallbladder dilatation wall thickening.  Common Bile Duct:   Dilated measuring 9 mm.  Intrahepatic bile ducts also appear dilated in the left hepatic lobe.  Liver: Coarse increased echotexture, consistent with steatosis or cirrhosis.  No focal liver mass identified.  IVC:  Appears normal.  Pancreas:  Dilated pancreatic duct seen in the pancreatic body measuring 7 mm.  Pancreatic head not visualized.  Spleen:  Within normal limits in size and echotexture.  Right kidney:  Normal in size and parenchymal echogenicity.  No evidence of mass or hydronephrosis.  Left kidney:  Normal in size and parenchymal echogenicity.  No evidence of mass or hydronephrosis.  Abdominal Aorta:  No aneurysm identified.  IMPRESSION:  1.  Cholelithiasis.  No sonographic signs of acute cholecystitis. 2.  Biliary and pancreatic ductal dilatation.  Etiology not visualized by ultrasound.  Pancreatic head mass or choledocholithiasis cannot be excluded.  Consider abdomen CT without and with contrast (pancreatic mass protocol) for further evaluation. 3.  Hepatic steatosis or cirrhosis.  Original Report Authenticated By: Danae Orleans, M.D.    Medications: I have reviewed the patient's current medications. Scheduled Meds:    . aspirin  325 mg Oral BID  . docusate sodium  100 mg Oral BID  . fenofibrate  160 mg Oral Daily  . gabapentin  800 mg Oral TID  . HYDROmorphone      . insulin aspart  0-15 Units Subcutaneous Q4H  . insulin aspart protamine-insulin aspart  75 Units Subcutaneous Q supper  . insulin aspart protamine-insulin aspart  80 Units Subcutaneous Q breakfast  . piperacillin-tazobactam (ZOSYN)  IV  3.375 g Intravenous Q8H  . senna  1 tablet Oral BID  . simvastatin  40 mg Oral q1800  . sodium chloride  3 mL Intravenous Q12H  . DISCONTD: insulin aspart  0-15 Units Subcutaneous TID WC   Continuous Infusions:    . sodium chloride    . DISCONTD: dextrose 5 % and 0.9% NaCl 50 mL/hr (09/16/11 0852)   PRN Meds:.acetaminophen, acetaminophen, fentaNYL, HYDROmorphone (DILAUDID)  injection, iohexol, midazolam, ondansetron (ZOFRAN) IV, ondansetron, polyethylene glycol, DISCONTD:  HYDROmorphone (DILAUDID) injection Assessment/Plan: Patient Active Hospital Problem List: Bacteremia due to E. Coli   Assessment:E.coli susceptible to Zosyn. Pt on da #4 will continue antibiotics for  a total of 14 days.  Sepsis (09/14/2011)   Assessment: Resolved    Plan: On Zosyn day#4. Continue.  Hyponatremia   Assessment: Will change IVF to 0.9NS.  DM w/o complication type II (07/24/2008)   Assessment:Pt back on diet. Will consider resuming oral medications   DIABETIC FOOT ULCER, TOE (08/01/2008)   Assessment: The ulcer appears to be healing well.In the meantime I will continue the dressing with iodoform gauze to the wound covered with a dry dressing.     GALLSTONES (03/23/2010)   Assessment: Pt with choledocholithiasis and ascending cholangitis.     RENAL INSUFFICIENCY, CHRONIC (07/24/2008)   Assessment: Patient has chronic renal insufficiency his baseline creatinine is unknown he'll contact his primary care physician to obtain those records to    Ascending cholangitis (09/14/2011)   Assessment: Failed ERCP attempt. For IR attempt and possible surgery today.     LOS: 4 days

## 2011-09-18 NOTE — Progress Notes (Addendum)
2 Days Post-Op  Subjective: Pt feeling better since cholecystostomy 5/17; still has some RUQ/epigastric discomfort  Objective: Vital signs in last 24 hours: Temp:  [98.1 F (36.7 C)-99.8 F (37.7 C)] 98.1 F (36.7 C) (05/18 1100) Pulse Rate:  [73-98] 87  (05/18 1100) Resp:  [12-19] 18  (05/18 1100) BP: (109-161)/(50-84) 110/66 mmHg (05/18 1100) SpO2:  [92 %-100 %] 95 % (05/18 1100) Weight:  [279 lb (126.554 kg)-279 lb 8.7 oz (126.8 kg)] 279 lb (126.554 kg) (05/18 1100) Last BM Date: 09/16/11  Intake/Output from previous day: 05/17 0701 - 05/18 0700 In: 987.5 [I.V.:850; IV Piggyback:137.5] Out: 2345 [Urine:1675; Drains:670] Intake/Output this shift: Total I/O In: 1005 [P.O.:680; I.V.:300; IV Piggyback:25] Out: 300 [Drains:300]  GB drain intact, dressing dry, site mildly tender, output 300 cc's today, cx's pend, no hemobilia  Lab Results:   Basename 09/17/11 0345 09/16/11 0036  WBC 4.0 6.3  HGB 9.6* 9.6*  HCT 30.0* 29.5*  PLT 245 227   BMET  Basename 09/18/11 0348 09/17/11 0345  NA 129* 134*  K 4.1 4.0  CL 98 102  CO2 19 22  GLUCOSE 231* 155*  BUN 21 22  CREATININE 1.55* 1.65*  CALCIUM 8.6 8.6   PT/INR  Basename 09/17/11 1245  LABPROT 14.3  INR 1.09   ABG No results found for this basename: PHART:2,PCO2:2,PO2:2,HCO3:2 in the last 72 hours Results for orders placed during the hospital encounter of 09/14/11  CULTURE, BLOOD (ROUTINE X 2)     Status: Normal (Preliminary result)   Collection Time   09/14/11  5:07 PM      Component Value Range Status Comment   Specimen Description BLOOD RIGHT HAND   Final    Special Requests BOTTLES DRAWN AEROBIC ONLY 5 CC   Final    Culture  Setup Time [id number]   Final    Culture     Final    Value:        BLOOD CULTURE RECEIVED NO GROWTH TO DATE CULTURE WILL BE HELD FOR 5 DAYS BEFORE ISSUING A FINAL NEGATIVE REPORT   Report Status PENDING   Incomplete   CULTURE, BLOOD (ROUTINE X 2)     Status: Normal   Collection Time     09/14/11  5:07 PM      Component Value Range Status Comment   Specimen Description BLOOD RIGHT ANTECUBITAL   Final    Special Requests BOTTLES DRAWN AEROBIC AND ANAEROBIC [facility]   Final    Culture  Setup Time [id number]   Final    Culture     Final    Value: ESCHERICHIA COLI     Note: Gram Stain Report Called to,Read Back By and Verified With: VEE GRAVES @ 2135 ON 09/15/2011 HAJAM   Report Status 09/17/2011 FINAL   Final    Organism ID, Bacteria ESCHERICHIA COLI   Final   MRSA PCR SCREENING     Status: Normal   Collection Time   09/15/11 12:10 AM      Component Value Range Status Comment   MRSA by PCR NEGATIVE  NEGATIVE  Final   CULTURE, ROUTINE-ABSCESS     Status: Normal (Preliminary result)   Collection Time   09/17/11  4:25 PM      Component Value Range Status Comment   Specimen Description GALL BLADDER   Final    Special Requests Normal   Final    Gram Stain     Final    Value: NO WBC SEEN  NO SQUAMOUS EPITHELIAL CELLS SEEN     NO ORGANISMS SEEN   Culture Culture reincubated for better growth   Final    Report Status PENDING   Incomplete     Studies/Results: Ir Perc Cholecystostomy  09/17/2011  *RADIOLOGY REPORT*  Clinical Data:  Cholelithiasis, cholecystitis and cholangitis with possible choledocholithiasis by imaging.  The patient is not currently a good candidate for cholecystectomy and request has been made to place a cholecystostomy tube.  PERCUTANEOUS CHOLECYSTOSTOMY  Comparison:  CT of the abdomen and ultrasound of the abdomen on 09/14/2011.  Sedation:  2.0 mg IV Versed; 100 mcg IV Fentanyl.  Total Moderate Sedation Time: 12 minutes.  Contrast:  10 ml Omnipaque 300  The patient was receiving a dose of IV Zosyn as the procedure was started and therefore additional prophylactic antibiotics were not given prior to the procedure.  Fluoroscopy Time: 0.7 minutes.  Procedure:  The procedure, risks, benefits, and alternatives were explained to the patient.  Questions  regarding the procedure were encouraged and answered.  The patient understands and consents to the procedure.  The right abdominal wall was prepped with Betadine in a sterile fashion, and a sterile drape was applied covering the operative field.  A sterile gown and sterile gloves were used for the procedure. Local anesthesia was provided with 1% Lidocaine. Ultrasound image documentation was performed.  Fluoroscopy during the procedure and fluoro spot radiograph confirms appropriate catheter position.  Ultrasound was utilized to localize the gallbladder.  Under direct ultrasound guidance, an 18 gauge needle was advanced via a transhepatic approach into the gallbladder lumen.  Aspiration was performed and a bile sample sent for culture studies.  A small amount of diluted contrast material was injected.  A guide wire was then advanced into the gallbladder.  Percutaneous tract dilatation was then performed over a guide wire to 10-French.  A 10-French pigtail drainage catheter was then advanced into the gallbladder lumen under fluoroscopy.  Catheter was formed and injected with contrast material to confirm position. The catheter was flushed and connected to a gravity drainage bag. It was secured at the skin with a Prolene retention suture and Stat- Lock device.  Complications:  None  Findings:  After needle puncture of the gallbladder, a bile sample was aspirated and sent for culture.  The bile was mildly turbid and also foul-smelling.  The cholecystostomy tube was advanced into the gallbladder lumen and formed.  It is now draining bile.  This tube will be left to gravity drainage.  IMPRESSION: Percutaneous cholecystostomy with placement of 10-French drainage catheter into the gallbladder lumen.  This was left to gravity drainage.  Original Report Authenticated By: Reola Calkins, M.D.    Anti-infectives: Anti-infectives     Start     Dose/Rate Route Frequency Ordered Stop   09/15/11 0600    piperacillin-tazobactam (ZOSYN) IVPB 3.375 g        3.375 g 12.5 mL/hr over 240 Minutes Intravenous 3 times per day 09/15/11 0041     09/14/11 1830   vancomycin (VANCOCIN) IVPB 1000 mg/200 mL premix        1,000 mg 200 mL/hr over 60 Minutes Intravenous  Once 09/14/11 1807 09/14/11 2002   09/14/11 1830   piperacillin-tazobactam (ZOSYN) IVPB 3.375 g        3.375 g 12.5 mL/hr over 240 Minutes Intravenous  Once 09/14/11 1807 09/15/11 0112          Assessment/Plan: s/p perc cholecystostomy 5/17; cont current tx, irrigate drain , check  final cx's/monitor labs/LFT's ; drain will need to remain in place at least 4-6 weeks to allow for tract maturation before removing unless cholecystectomy done in interval    LOS: 4 days    Grantley Savage,D [facility] 09/18/2011

## 2011-09-18 NOTE — Progress Notes (Addendum)
Patient ID: Frank Ashley, male   DOB: Sep 30, 1940, 71 y.o.   MRN: [id number] [facility] Surgery Progress Note:   2 Days Post-Op  Subjective: Mental status is clear.   Objective: Vital signs in last 24 hours: Temp:  [97.8 F (36.6 C)-99.8 F (37.7 C)] 99.5 F (37.5 C) (05/18 0400) Pulse Rate:  [73-98] 85  (05/18 0400) Resp:  [12-19] 16  (05/18 0400) BP: (109-161)/(50-84) 109/71 mmHg (05/18 0400) SpO2:  [92 %-100 %] 94 % (05/18 0400) Weight:  [279 lb 8.7 oz (126.8 kg)] 279 lb 8.7 oz (126.8 kg) (05/18 0400)  Intake/Output from previous day: 05/17 0701 - 05/18 0700 In: 987.5 [I.V.:850; IV Piggyback:137.5] Out: 2345 [Urine:1675; Drains:670] Intake/Output this shift:    Physical Exam: Work of breathing is  Normal.  Percutaneous cholecystostomy in place.    Lab Results:  Results for orders placed during the hospital encounter of 09/14/11 (from the past 48 hour(s))  GLUCOSE, CAPILLARY     Status: Abnormal   Collection Time   09/16/11  8:04 AM      Component Value Range Comment   Glucose-Capillary 66 (*) 70 - 99 (mg/dL)    Comment 1 Documented in Chart      Comment 2 Notify RN     GLUCOSE, CAPILLARY     Status: Abnormal   Collection Time   09/16/11  9:11 AM      Component Value Range Comment   Glucose-Capillary 118 (*) 70 - 99 (mg/dL)   GLUCOSE, CAPILLARY     Status: Abnormal   Collection Time   09/16/11 10:20 AM      Component Value Range Comment   Glucose-Capillary 129 (*) 70 - 99 (mg/dL)   GLUCOSE, CAPILLARY     Status: Abnormal   Collection Time   09/16/11  4:29 PM      Component Value Range Comment   Glucose-Capillary 142 (*) 70 - 99 (mg/dL)   GLUCOSE, CAPILLARY     Status: Abnormal   Collection Time   09/16/11  5:22 PM      Component Value Range Comment   Glucose-Capillary 150 (*) 70 - 99 (mg/dL)   GLUCOSE, CAPILLARY     Status: Abnormal   Collection Time   09/16/11  9:19 PM      Component Value Range Comment   Glucose-Capillary 162 (*) 70 - 99 (mg/dL)   BASIC  METABOLIC PANEL     Status: Abnormal   Collection Time   09/17/11  3:45 AM      Component Value Range Comment   Sodium 134 (*) 135 - 145 (mEq/L)    Potassium 4.0  3.5 - 5.1 (mEq/L)    Chloride 102  96 - 112 (mEq/L)    CO2 22  19 - 32 (mEq/L)    Glucose, Bld 155 (*) 70 - 99 (mg/dL)    BUN 22  6 - 23 (mg/dL)    Creatinine, Ser 9.14 (*) 0.50 - 1.35 (mg/dL)    Calcium 8.6  8.4 - 10.5 (mg/dL)    GFR calc non Af Amer 40 (*) >90 (mL/min)    GFR calc Af Amer 47 (*) >90 (mL/min)   CBC     Status: Abnormal   Collection Time   09/17/11  3:45 AM      Component Value Range Comment   WBC 4.0  4.0 - 10.5 (K/uL)    RBC 4.01 (*) 4.22 - 5.81 (MIL/uL)    Hemoglobin 9.6 (*) 13.0 - 17.0 (g/dL)  HCT 30.0 (*) 39.0 - 52.0 (%)    MCV 74.8 (*) 78.0 - 100.0 (fL)    MCH 23.9 (*) 26.0 - 34.0 (pg)    MCHC 32.0  30.0 - 36.0 (g/dL)    RDW 09.8 (*) 11.9 - 15.5 (%)    Platelets 245  150 - 400 (K/uL)   DIFFERENTIAL     Status: Abnormal   Collection Time   09/17/11  3:45 AM      Component Value Range Comment   Neutrophils Relative 55  43 - 77 (%)    Neutro Abs 2.2  1.7 - 7.7 (K/uL)    Lymphocytes Relative 23  12 - 46 (%)    Lymphs Abs 0.9  0.7 - 4.0 (K/uL)    Monocytes Relative 11  3 - 12 (%)    Monocytes Absolute 0.5  0.1 - 1.0 (K/uL)    Eosinophils Relative 10 (*) 0 - 5 (%)    Eosinophils Absolute 0.4  0.0 - 0.7 (K/uL)    Basophils Relative 1  0 - 1 (%)    Basophils Absolute 0.0  0.0 - 0.1 (K/uL)   HEPATIC FUNCTION PANEL     Status: Abnormal   Collection Time   09/17/11  6:45 AM      Component Value Range Comment   Total Protein 6.1  6.0 - 8.3 (g/dL)    Albumin 2.5 (*) 3.5 - 5.2 (g/dL)    AST 147 (*) 0 - 37 (U/L)    ALT 125 (*) 0 - 53 (U/L)    Alkaline Phosphatase 352 (*) 39 - 117 (U/L)    Total Bilirubin 5.4 (*) 0.3 - 1.2 (mg/dL)    Bilirubin, Direct 4.2 (*) 0.0 - 0.3 (mg/dL)    Indirect Bilirubin 1.2 (*) 0.3 - 0.9 (mg/dL)   TYPE AND SCREEN     Status: Normal   Collection Time   09/17/11  6:45 AM        Component Value Range Comment   ABO/RH(D) B POS      Antibody Screen NEG      Sample Expiration 09/20/2011     ABO/RH     Status: Normal   Collection Time   09/17/11  6:50 AM      Component Value Range Comment   ABO/RH(D) B POS     GLUCOSE, CAPILLARY     Status: Abnormal   Collection Time   09/17/11  7:32 AM      Component Value Range Comment   Glucose-Capillary 136 (*) 70 - 99 (mg/dL)    Comment 1 Documented in Chart      Comment 2 Notify RN     GLUCOSE, CAPILLARY     Status: Abnormal   Collection Time   09/17/11 11:49 AM      Component Value Range Comment   Glucose-Capillary 173 (*) 70 - 99 (mg/dL)    Comment 1 Documented in Chart      Comment 2 Notify RN     PROTIME-INR     Status: Normal   Collection Time   09/17/11 12:45 PM      Component Value Range Comment   Prothrombin Time 14.3  11.6 - 15.2 (seconds)    INR 1.09  0.00 - 1.49    APTT     Status: Normal   Collection Time   09/17/11 12:45 PM      Component Value Range Comment   aPTT 33  24 - 37 (seconds)   CULTURE, ROUTINE-ABSCESS  Status: Normal (Preliminary result)   Collection Time   09/17/11  4:25 PM      Component Value Range Comment   Specimen Description GALL BLADDER      Special Requests Normal      Gram Stain        Value: NO WBC SEEN     NO SQUAMOUS EPITHELIAL CELLS SEEN     NO ORGANISMS SEEN   Culture Culture reincubated for better growth      Report Status PENDING     GLUCOSE, CAPILLARY     Status: Abnormal   Collection Time   09/17/11  5:05 PM      Component Value Range Comment   Glucose-Capillary 156 (*) 70 - 99 (mg/dL)   GLUCOSE, CAPILLARY     Status: Abnormal   Collection Time   09/17/11  8:08 PM      Component Value Range Comment   Glucose-Capillary 189 (*) 70 - 99 (mg/dL)    Comment 1 Documented in Chart      Comment 2 Notify RN     GLUCOSE, CAPILLARY     Status: Abnormal   Collection Time   09/18/11 12:07 AM      Component Value Range Comment   Glucose-Capillary 222 (*) 70 - 99  (mg/dL)    Comment 1 Documented in Chart      Comment 2 Notify RN     COMPREHENSIVE METABOLIC PANEL     Status: Abnormal   Collection Time   09/18/11  3:48 AM      Component Value Range Comment   Sodium 129 (*) 135 - 145 (mEq/L)    Potassium 4.1  3.5 - 5.1 (mEq/L)    Chloride 98  96 - 112 (mEq/L)    CO2 19  19 - 32 (mEq/L)    Glucose, Bld 231 (*) 70 - 99 (mg/dL)    BUN 21  6 - 23 (mg/dL)    Creatinine, Ser 1.61 (*) 0.50 - 1.35 (mg/dL)    Calcium 8.6  8.4 - 10.5 (mg/dL)    Total Protein 6.2  6.0 - 8.3 (g/dL)    Albumin 2.5 (*) 3.5 - 5.2 (g/dL)    AST 46 (*) 0 - 37 (U/L)    ALT 93 (*) 0 - 53 (U/L)    Alkaline Phosphatase 314 (*) 39 - 117 (U/L)    Total Bilirubin 3.8 (*) 0.3 - 1.2 (mg/dL)    GFR calc non Af Amer 44 (*) >90 (mL/min)    GFR calc Af Amer 51 (*) >90 (mL/min)   GLUCOSE, CAPILLARY     Status: Abnormal   Collection Time   09/18/11  3:56 AM      Component Value Range Comment   Glucose-Capillary 213 (*) 70 - 99 (mg/dL)     Radiology/Results: Ir Perc Cholecystostomy  09/17/2011  *RADIOLOGY REPORT*  Clinical Data:  Cholelithiasis, cholecystitis and cholangitis with possible choledocholithiasis by imaging.  The patient is not currently a good candidate for cholecystectomy and request has been made to place a cholecystostomy tube.  PERCUTANEOUS CHOLECYSTOSTOMY  Comparison:  CT of the abdomen and ultrasound of the abdomen on 09/14/2011.  Sedation:  2.0 mg IV Versed; 100 mcg IV Fentanyl.  Total Moderate Sedation Time: 12 minutes.  Contrast:  10 ml Omnipaque 300  The patient was receiving a dose of IV Zosyn as the procedure was started and therefore additional prophylactic antibiotics were not given prior to the procedure.  Fluoroscopy Time: 0.7 minutes.  Procedure:  The procedure, risks, benefits, and alternatives were explained to the patient.  Questions regarding the procedure were encouraged and answered.  The patient understands and consents to the procedure.  The right abdominal  wall was prepped with Betadine in a sterile fashion, and a sterile drape was applied covering the operative field.  A sterile gown and sterile gloves were used for the procedure. Local anesthesia was provided with 1% Lidocaine. Ultrasound image documentation was performed.  Fluoroscopy during the procedure and fluoro spot radiograph confirms appropriate catheter position.  Ultrasound was utilized to localize the gallbladder.  Under direct ultrasound guidance, an 18 gauge needle was advanced via a transhepatic approach into the gallbladder lumen.  Aspiration was performed and a bile sample sent for culture studies.  A small amount of diluted contrast material was injected.  A guide wire was then advanced into the gallbladder.  Percutaneous tract dilatation was then performed over a guide wire to 10-French.  A 10-French pigtail drainage catheter was then advanced into the gallbladder lumen under fluoroscopy.  Catheter was formed and injected with contrast material to confirm position. The catheter was flushed and connected to a gravity drainage bag. It was secured at the skin with a Prolene retention suture and Stat- Lock device.  Complications:  None  Findings:  After needle puncture of the gallbladder, a bile sample was aspirated and sent for culture.  The bile was mildly turbid and also foul-smelling.  The cholecystostomy tube was advanced into the gallbladder lumen and formed.  It is now draining bile.  This tube will be left to gravity drainage.  IMPRESSION: Percutaneous cholecystostomy with placement of 10-French drainage catheter into the gallbladder lumen.  This was left to gravity drainage.  Original Report Authenticated By: Reola Calkins, M.D.    Anti-infectives: Anti-infectives     Start     Dose/Rate Route Frequency Ordered Stop   09/15/11 0600  piperacillin-tazobactam (ZOSYN) IVPB 3.375 g       3.375 g 12.5 mL/hr over 240 Minutes Intravenous 3 times per day 09/15/11 0041     09/14/11 1830    vancomycin (VANCOCIN) IVPB 1000 mg/200 mL premix        1,000 mg 200 mL/hr over 60 Minutes Intravenous  Once 09/14/11 1807 09/14/11 2002   09/14/11 1830  piperacillin-tazobactam (ZOSYN) IVPB 3.375 g       3.375 g 12.5 mL/hr over 240 Minutes Intravenous  Once 09/14/11 1807 09/15/11 0112          Assessment/Plan: Problem List: Patient Active Problem List  Diagnoses  . MRSA  . DM w/o complication type II  . DIABETIC FOOT ULCER, TOE  . HYPERLIPIDEMIA  . OVERWEIGHT  . PERIPHERAL NEUROPATHY  . Allergic rhinitis, cause unspecified  . GERD  . GALLSTONES  . ACUTE PANCREATITIS  . RENAL INSUFFICIENCY, CHRONIC  . SLEEP APNEA  . Sepsis  . Ascending cholangitis  . Bacteremia due to Escherichia coli    Doing well post perc drainage of infected gallbladder. He appears to be feeling better and is responding to sepsis from E. Coli.  Bili elevated.  Would discuss further endoscopic interventions to clear CBD before attempting surgical CBDE.  2 Days Post-Op    LOS: 4 days   Matt B. Daphine Deutscher, MD, [facility] [phone] beeper [phone]  09/18/2011 7:57 AM

## 2011-09-19 DIAGNOSIS — K8309 Other cholangitis: Secondary | ICD-10-CM

## 2011-09-19 DIAGNOSIS — R7881 Bacteremia: Secondary | ICD-10-CM

## 2011-09-19 DIAGNOSIS — E118 Type 2 diabetes mellitus with unspecified complications: Secondary | ICD-10-CM

## 2011-09-19 DIAGNOSIS — N189 Chronic kidney disease, unspecified: Secondary | ICD-10-CM

## 2011-09-19 DIAGNOSIS — E1165 Type 2 diabetes mellitus with hyperglycemia: Secondary | ICD-10-CM

## 2011-09-19 LAB — DIFFERENTIAL
Basophils Relative: 1 % (ref 0–1)
Lymphocytes Relative: 19 % (ref 12–46)
Lymphs Abs: 1.9 10*3/uL (ref 0.7–4.0)
Monocytes Absolute: 1.1 10*3/uL — ABNORMAL HIGH (ref 0.1–1.0)
Monocytes Relative: 11 % (ref 3–12)
Neutro Abs: 6.4 10*3/uL (ref 1.7–7.7)

## 2011-09-19 LAB — BASIC METABOLIC PANEL
BUN: 20 mg/dL (ref 6–23)
Chloride: 99 mEq/L (ref 96–112)
GFR calc Af Amer: 49 mL/min — ABNORMAL LOW (ref >90)
Glucose, Bld: 202 mg/dL — ABNORMAL HIGH (ref 70–99)
Potassium: 4.2 mEq/L (ref 3.5–5.1)

## 2011-09-19 LAB — GLUCOSE, CAPILLARY
Glucose-Capillary: 230 mg/dL — ABNORMAL HIGH (ref 70–99)
Glucose-Capillary: 175 mg/dL — ABNORMAL HIGH (ref 70–99)

## 2011-09-19 LAB — CBC
HCT: 29.3 % — ABNORMAL LOW (ref 39.0–52.0)
Hemoglobin: 9.3 g/dL — ABNORMAL LOW (ref 13.0–17.0)
MCHC: 31.7 g/dL (ref 30.0–36.0)

## 2011-09-19 MED ORDER — OXYCODONE HCL 5 MG PO TABS
10.0000 mg | ORAL_TABLET | ORAL | Status: DC | PRN
  Administered 2011-09-19: 10 mg via ORAL
  Filled 2011-09-19: qty 2

## 2011-09-19 MED ORDER — HYDROMORPHONE HCL PF 2 MG/ML IJ SOLN
1.5000 mg | INTRAMUSCULAR | Status: DC | PRN
  Administered 2011-09-20 – 2011-09-21 (×8): 1.5 mg via INTRAVENOUS
  Filled 2011-09-19 (×8): qty 1

## 2011-09-19 NOTE — Progress Notes (Signed)
PT Cancellation Note  Treatment cancelled today due to pt continues to be on strict bedrest.  Please reorder PT when pt able to begin mobilization. Thanks!Manson Passey, Cherylann Parr 09/19/2011, 1:35 PM

## 2011-09-19 NOTE — Progress Notes (Signed)
Patient ID: Frank Ashley, male   DOB: August 12, 1940, 71 y.o.   MRN: [id number] [facility] Surgery Progress Note:   3 Days Post-Op  Subjective: Mental status is clear.  Complaining of some cramping pain.  Was transferred from unit to [address].   Objective: Vital signs in last 24 hours: Temp:  [97.8 F (36.6 C)-99.6 F (37.6 C)] 97.8 F (36.6 C) (05/19 0600) Pulse Rate:  [75-91] 91  (05/19 0600) Resp:  [18] 18  (05/19 0600) BP: (110-136)/(61-89) 129/61 mmHg (05/19 0600) SpO2:  [94 %-97 %] 96 % (05/19 0600) Weight:  [279 lb (126.554 kg)-280 lb (127.007 kg)] 280 lb (127.007 kg) (05/19 0600)  Intake/Output from previous day: 05/18 0701 - 05/19 0700 In: 3926.7 [P.O.:920; I.V.:2981.7; IV Piggyback:25] Out: 780 [Drains:780] Intake/Output this shift:    Physical Exam: Work of breathing is  Normal.  Percutaneous cholecystostomy is draining dark bile.  Urine dark.    Lab Results:  Results for orders placed during the hospital encounter of 09/14/11 (from the past 48 hour(s))  GLUCOSE, CAPILLARY     Status: Abnormal   Collection Time   09/17/11 11:49 AM      Component Value Range Comment   Glucose-Capillary 173 (*) 70 - 99 (mg/dL)    Comment 1 Documented in Chart      Comment 2 Notify RN     PROTIME-INR     Status: Normal   Collection Time   09/17/11 12:45 PM      Component Value Range Comment   Prothrombin Time 14.3  11.6 - 15.2 (seconds)    INR 1.09  0.00 - 1.49    APTT     Status: Normal   Collection Time   09/17/11 12:45 PM      Component Value Range Comment   aPTT 33  24 - 37 (seconds)   CULTURE, ROUTINE-ABSCESS     Status: Normal (Preliminary result)   Collection Time   09/17/11  4:25 PM      Component Value Range Comment   Specimen Description GALL BLADDER      Special Requests Normal      Gram Stain        Value: NO WBC SEEN     NO SQUAMOUS EPITHELIAL CELLS SEEN     NO ORGANISMS SEEN   Culture ABUNDANT GRAM NEGATIVE RODS      Report Status PENDING     GLUCOSE,  CAPILLARY     Status: Abnormal   Collection Time   09/17/11  5:05 PM      Component Value Range Comment   Glucose-Capillary 156 (*) 70 - 99 (mg/dL)   GLUCOSE, CAPILLARY     Status: Abnormal   Collection Time   09/17/11  8:08 PM      Component Value Range Comment   Glucose-Capillary 189 (*) 70 - 99 (mg/dL)    Comment 1 Documented in Chart      Comment 2 Notify RN     GLUCOSE, CAPILLARY     Status: Abnormal   Collection Time   09/18/11 12:07 AM      Component Value Range Comment   Glucose-Capillary 222 (*) 70 - 99 (mg/dL)    Comment 1 Documented in Chart      Comment 2 Notify RN     COMPREHENSIVE METABOLIC PANEL     Status: Abnormal   Collection Time   09/18/11  3:48 AM      Component Value Range Comment   Sodium 129 (*)  135 - 145 (mEq/L)    Potassium 4.1  3.5 - 5.1 (mEq/L)    Chloride 98  96 - 112 (mEq/L)    CO2 19  19 - 32 (mEq/L)    Glucose, Bld 231 (*) 70 - 99 (mg/dL)    BUN 21  6 - 23 (mg/dL)    Creatinine, Ser 4.09 (*) 0.50 - 1.35 (mg/dL)    Calcium 8.6  8.4 - 10.5 (mg/dL)    Total Protein 6.2  6.0 - 8.3 (g/dL)    Albumin 2.5 (*) 3.5 - 5.2 (g/dL)    AST 46 (*) 0 - 37 (U/L)    ALT 93 (*) 0 - 53 (U/L)    Alkaline Phosphatase 314 (*) 39 - 117 (U/L)    Total Bilirubin 3.8 (*) 0.3 - 1.2 (mg/dL)    GFR calc non Af Amer 44 (*) >90 (mL/min)    GFR calc Af Amer 51 (*) >90 (mL/min)   GLUCOSE, CAPILLARY     Status: Abnormal   Collection Time   09/18/11  3:56 AM      Component Value Range Comment   Glucose-Capillary 213 (*) 70 - 99 (mg/dL)   GLUCOSE, CAPILLARY     Status: Abnormal   Collection Time   09/18/11  8:23 AM      Component Value Range Comment   Glucose-Capillary 201 (*) 70 - 99 (mg/dL)    Comment 1 Documented in Chart      Comment 2 Notify RN     GLUCOSE, CAPILLARY     Status: Abnormal   Collection Time   09/18/11 12:32 PM      Component Value Range Comment   Glucose-Capillary 139 (*) 70 - 99 (mg/dL)    Comment 1 Notify RN     GLUCOSE, CAPILLARY     Status:  Abnormal   Collection Time   09/18/11  5:11 PM      Component Value Range Comment   Glucose-Capillary 148 (*) 70 - 99 (mg/dL)   GLUCOSE, CAPILLARY     Status: Abnormal   Collection Time   09/18/11  9:37 PM      Component Value Range Comment   Glucose-Capillary 183 (*) 70 - 99 (mg/dL)   BASIC METABOLIC PANEL     Status: Abnormal   Collection Time   09/19/11  5:41 AM      Component Value Range Comment   Sodium 131 (*) 135 - 145 (mEq/L)    Potassium 4.2  3.5 - 5.1 (mEq/L)    Chloride 99  96 - 112 (mEq/L)    CO2 23  19 - 32 (mEq/L)    Glucose, Bld 202 (*) 70 - 99 (mg/dL)    BUN 20  6 - 23 (mg/dL)    Creatinine, Ser 8.11 (*) 0.50 - 1.35 (mg/dL)    Calcium 8.5  8.4 - 10.5 (mg/dL)    GFR calc non Af Amer 42 (*) >90 (mL/min)    GFR calc Af Amer 49 (*) >90 (mL/min)   CBC     Status: Abnormal   Collection Time   09/19/11  5:41 AM      Component Value Range Comment   WBC 10.1  4.0 - 10.5 (K/uL)    RBC 3.86 (*) 4.22 - 5.81 (MIL/uL)    Hemoglobin 9.3 (*) 13.0 - 17.0 (g/dL)    HCT 91.4 (*) 78.2 - 52.0 (%)    MCV 75.9 (*) 78.0 - 100.0 (fL)    MCH 24.1 (*) 26.0 - 34.0 (  pg)    MCHC 31.7  30.0 - 36.0 (g/dL)    RDW 16.1 (*) 09.6 - 15.5 (%)    Platelets 250  150 - 400 (K/uL)   DIFFERENTIAL     Status: Abnormal   Collection Time   09/19/11  5:41 AM      Component Value Range Comment   Neutrophils Relative 64  43 - 77 (%)    Neutro Abs 6.4  1.7 - 7.7 (K/uL)    Lymphocytes Relative 19  12 - 46 (%)    Lymphs Abs 1.9  0.7 - 4.0 (K/uL)    Monocytes Relative 11  3 - 12 (%)    Monocytes Absolute 1.1 (*) 0.1 - 1.0 (K/uL)    Eosinophils Relative 6 (*) 0 - 5 (%)    Eosinophils Absolute 0.6  0.0 - 0.7 (K/uL)    Basophils Relative 1  0 - 1 (%)    Basophils Absolute 0.1  0.0 - 0.1 (K/uL)     Radiology/Results: Ir Perc Cholecystostomy  09/17/2011  *RADIOLOGY REPORT*  Clinical Data:  Cholelithiasis, cholecystitis and cholangitis with possible choledocholithiasis by imaging.  The patient is not  currently a good candidate for cholecystectomy and request has been made to place a cholecystostomy tube.  PERCUTANEOUS CHOLECYSTOSTOMY  Comparison:  CT of the abdomen and ultrasound of the abdomen on 09/14/2011.  Sedation:  2.0 mg IV Versed; 100 mcg IV Fentanyl.  Total Moderate Sedation Time: 12 minutes.  Contrast:  10 ml Omnipaque 300  The patient was receiving a dose of IV Zosyn as the procedure was started and therefore additional prophylactic antibiotics were not given prior to the procedure.  Fluoroscopy Time: 0.7 minutes.  Procedure:  The procedure, risks, benefits, and alternatives were explained to the patient.  Questions regarding the procedure were encouraged and answered.  The patient understands and consents to the procedure.  The right abdominal wall was prepped with Betadine in a sterile fashion, and a sterile drape was applied covering the operative field.  A sterile gown and sterile gloves were used for the procedure. Local anesthesia was provided with 1% Lidocaine. Ultrasound image documentation was performed.  Fluoroscopy during the procedure and fluoro spot radiograph confirms appropriate catheter position.  Ultrasound was utilized to localize the gallbladder.  Under direct ultrasound guidance, an 18 gauge needle was advanced via a transhepatic approach into the gallbladder lumen.  Aspiration was performed and a bile sample sent for culture studies.  A small amount of diluted contrast material was injected.  A guide wire was then advanced into the gallbladder.  Percutaneous tract dilatation was then performed over a guide wire to 10-French.  A 10-French pigtail drainage catheter was then advanced into the gallbladder lumen under fluoroscopy.  Catheter was formed and injected with contrast material to confirm position. The catheter was flushed and connected to a gravity drainage bag. It was secured at the skin with a Prolene retention suture and Stat- Lock device.  Complications:  None  Findings:   After needle puncture of the gallbladder, a bile sample was aspirated and sent for culture.  The bile was mildly turbid and also foul-smelling.  The cholecystostomy tube was advanced into the gallbladder lumen and formed.  It is now draining bile.  This tube will be left to gravity drainage.  IMPRESSION: Percutaneous cholecystostomy with placement of 10-French drainage catheter into the gallbladder lumen.  This was left to gravity drainage.  Original Report Authenticated By: Reola Calkins, M.D.    Anti-infectives:  Anti-infectives     Start     Dose/Rate Route Frequency Ordered Stop   09/15/11 0600  piperacillin-tazobactam (ZOSYN) IVPB 3.375 g       3.375 g 12.5 mL/hr over 240 Minutes Intravenous 3 times per day 09/15/11 0041     09/14/11 1830   vancomycin (VANCOCIN) IVPB 1000 mg/200 mL premix        1,000 mg 200 mL/hr over 60 Minutes Intravenous  Once 09/14/11 1807 09/14/11 2002   09/14/11 1830  piperacillin-tazobactam (ZOSYN) IVPB 3.375 g       3.375 g 12.5 mL/hr over 240 Minutes Intravenous  Once 09/14/11 1807 09/15/11 0112          Assessment/Plan: Problem List: Patient Active Problem List  Diagnoses  . MRSA  . DM w/o complication type II  . DIABETIC FOOT ULCER, TOE  . HYPERLIPIDEMIA  . OVERWEIGHT  . PERIPHERAL NEUROPATHY  . Allergic rhinitis, cause unspecified  . GERD  . GALLSTONES  . ACUTE PANCREATITIS  . RENAL INSUFFICIENCY, CHRONIC  . SLEEP APNEA  . Sepsis  . Ascending cholangitis  . Bacteremia due to Escherichia coli    Sepsis controlled but appears to have CBD stone with continued obstruction.  Would ask GI to consider repeat ERCP prior to trying CBDE.  3 Days Post-Op    LOS: 5 days   Matt B. Daphine Deutscher, MD, [facility] [phone] beeper [phone]  09/19/2011 8:21 AM

## 2011-09-19 NOTE — Progress Notes (Signed)
Subjective: Pt is status post biliary drain placement. Patient doing well but complaining of pain around the area to drain. He still appears somewhat)  Interval History: Pt had percutaneous drain placed by IR yestereday. BC showed e.coli.  Objective: Filed Vitals:   09/18/11 1500 09/18/11 2200 09/19/11 0600 09/19/11 1327  BP: 114/89 136/80 129/61 121/67  Pulse: 75 89 91   Temp: 97.8 F (36.6 C) 99.6 F (37.6 C) 97.8 F (36.6 C) 98.3 F (36.8 C)  TempSrc: Oral Oral Oral Oral  Resp: 18 18 18 18   Height:      Weight:   127.007 kg (280 lb)   SpO2: 97% 94% 96% 97%   Weight change: -0.246 kg (-8.7 oz)  Intake/Output Summary (Last 24 hours) at 09/19/11 1842 Last data filed at 09/19/11 1700  Gross per 24 hour  Intake 2223.72 ml  Output    630 ml  Net 1593.72 ml    General: Alert, awake, oriented x3, in no acute distress. Mild icterus HEENT: Star/AT PEERL, EOMI, Increased icterus. Neck: Trachea midline,  no masses, no thyromegal,y no JVD, no carotid bruit OROPHARYNX:  Moist, No exudate/ erythema/lesions.  Heart: Regular rate and rhythm, without murmurs, rubs, gallops, PMI non-displaced, no heaves or thrills on palpation.  Lungs: Clear to auscultation, no wheezing or rhonchi noted. No increased vocal fremitus resonant to percussion  Abdomen: Soft, mild right upper quadrant tenderness around the site of the drain.  Mildly distended, positive bowel sounds, no masses no hepatosplenomegaly noted..   Lab Results:  Basename 09/19/11 0541 09/18/11 0348  NA 131* 129*  K 4.2 4.1  CL 99 98  CO2 23 19  GLUCOSE 202* 231*  BUN 20 21  CREATININE 1.59* 1.55*  CALCIUM 8.5 8.6  MG -- --  PHOS -- --    Basename 09/18/11 0348 09/17/11 0645  AST 46* 100*  ALT 93* 125*  ALKPHOS 314* 352*  BILITOT 3.8* 5.4*  PROT 6.2 6.1  ALBUMIN 2.5* 2.5*   No results found for this basename: LIPASE:2,AMYLASE:2 in the last 72 hours  Basename 09/19/11 0541 09/17/11 0345  WBC 10.1 4.0  NEUTROABS 6.4  2.2  HGB 9.3* 9.6*  HCT 29.3* 30.0*  MCV 75.9* 74.8*  PLT 250 245   No results found for this basename: CKTOTAL:3,CKMB:3,CKMBINDEX:3,TROPONINI:3 in the last 72 hours No components found with this basename: POCBNP:3 No results found for this basename: DDIMER:2 in the last 72 hours No results found for this basename: HGBA1C:2 in the last 72 hours No results found for this basename: CHOL:2,HDL:2,LDLCALC:2,TRIG:2,CHOLHDL:2,LDLDIRECT:2 in the last 72 hours No results found for this basename: TSH,T4TOTAL,FREET3,T3FREE,THYROIDAB in the last 72 hours No results found for this basename: VITAMINB12:2,FOLATE:2,FERRITIN:2,TIBC:2,IRON:2,RETICCTPCT:2 in the last 72 hours  Micro Results: Recent Results (from the past 240 hour(s))  CULTURE, BLOOD (ROUTINE X 2)     Status: Normal (Preliminary result)   Collection Time   09/14/11  5:07 PM      Component Value Range Status Comment   Specimen Description BLOOD RIGHT HAND   Final    Special Requests BOTTLES DRAWN AEROBIC ONLY 5 CC   Final    Culture  Setup Time [id number]   Final    Culture     Final    Value:        BLOOD CULTURE RECEIVED NO GROWTH TO DATE CULTURE WILL BE HELD FOR 5 DAYS BEFORE ISSUING A FINAL NEGATIVE REPORT   Report Status PENDING   Incomplete   CULTURE, BLOOD (ROUTINE X 2)  Status: Normal   Collection Time   09/14/11  5:07 PM      Component Value Range Status Comment   Specimen Description BLOOD RIGHT ANTECUBITAL   Final    Special Requests BOTTLES DRAWN AEROBIC AND ANAEROBIC [facility]   Final    Culture  Setup Time [id number]   Final    Culture     Final    Value: ESCHERICHIA COLI     Note: Gram Stain Report Called to,Read Back By and Verified With: VEE GRAVES @ 2135 ON 09/15/2011 HAJAM   Report Status 09/17/2011 FINAL   Final    Organism ID, Bacteria ESCHERICHIA COLI   Final   MRSA PCR SCREENING     Status: Normal   Collection Time   09/15/11 12:10 AM      Component Value Range Status Comment   MRSA by PCR NEGATIVE   NEGATIVE  Final   CULTURE, ROUTINE-ABSCESS     Status: Normal (Preliminary result)   Collection Time   09/17/11  4:25 PM      Component Value Range Status Comment   Specimen Description GALL BLADDER   Final    Special Requests Normal   Final    Gram Stain     Final    Value: NO WBC SEEN     NO SQUAMOUS EPITHELIAL CELLS SEEN     NO ORGANISMS SEEN   Culture ABUNDANT GRAM NEGATIVE RODS   Final    Report Status PENDING   Incomplete     Studies/Results: Ct Abdomen Pelvis W Wo Contrast  09/14/2011  *RADIOLOGY REPORT*  Clinical Data: Right upper quadrant abdominal pain.  History gallstones and pancreatitis.  CT ABDOMEN AND PELVIS WITHOUT AND WITH CONTRAST  Technique:  Multidetector CT imaging of the abdomen and pelvis was performed without contrast material in one or both body regions, followed by contrast material(s) and further sections in one or both body regions.  Contrast: 80mL OMNIPAQUE IOHEXOL 300 MG/ML  SOLN  Comparison: 09/14/2011 ultrasound; CT scan from 02/05/2007  Findings: Punctate calcifications in the liver and spleen are probably from old granulomatous disease.  There is mild atelectasis in the lingula, left lower lobe, and posterior basal segment right lower lobe.  Several punctate calcifications present in the pancreatic body and head.  Mild peripancreatic stranding noted, particularly in the pancreatic head region.  Dependent gallstones noted in the gallbladder.  In the common bile duct on image 48 of series 3 and also probably in the vicinity of the ampulla, focal high densities favor choledocholithiasis.  Pancreatic atrophy noted.  Porta hepatis nodes measure up to 10 mm in short axis.  Small peripancreatic lymph nodes are also present, measuring up to about 10 mm in short axis.  There is an indistinct 3.4 x 2.7 cm region of hypodensity inferiorly in the lateral segment left hepatic lobe which is technically nonspecific and visible precontrast and postcontrast images.  This may reflect  focal fatty infiltration although underlying lesion is difficult to exclude.  No definite lesion was visible on this position on the prior exam from 2008.  Left adrenal gland mass measures 1.8 x 1.6 cm, technically nonspecific based on the imaging characteristics.  Atherosclerotic calcification of the abdominal aorta is present.  Abnormal exophytic lesion of the left kidney lower pole is enhancing and suspicious for a renal cell carcinoma.  No tumor thrombus in the left renal vein is observed.  This mass may measure up to 5 cm in diameter.  Lumbar spondylosis and  degenerative disc disease noted, with right foraminal stenosis at L5-S1.  The urinary bladder appears unremarkable.  IMPRESSION:  1.  Suspected choledocholithiasis with several stones in the common bile duct as a probable cause for the biliary dilatation. 2.  Exophytic enhancing mass in the right kidney lower pole, approximately 5 cm in diameter, probably a renal cell carcinoma. 3.  Indistinct low density lesion inferiorly in the lateral segment left hepatic lobe may represent fatty infiltration given the geographic appearance, but is essentially nonspecific.  MRI characterization is recommended using dynamic hepatic protocol with feasible. 4.  Nonspecific small left adrenal mass. 5.  Cholelithiasis. 6.  Old granulomatous disease. 7.  Mild findings of chronic pancreatitis.  Pancreatic atrophy. 8.  Mild peripancreatic stranding especially along the pancreatic head - pancreatitis is not excluded.  No acute fluid collections noted. 9.  Atherosclerosis. 10.  Lumbar spondylosis and degenerative disc disease, with right foraminal stenosis at L5-S1.  Original Report Authenticated By: Dellia Cloud, M.D.   US Abdomen Complete  09/14/2011  *RADIOLOGY REPORT*  Clinical Data:  Abdominal pain.  ABDOMINAL ULTRASOUND COMPLETE  Comparison:  None.  Findings:  Gallbladder:  Multiple small less than 1 cm gallstones are seen. No evidence of gallbladder dilatation  wall thickening.  Common Bile Duct:  Dilated measuring 9 mm.  Intrahepatic bile ducts also appear dilated in the left hepatic lobe.  Liver: Coarse increased echotexture, consistent with steatosis or cirrhosis.  No focal liver mass identified.  IVC:  Appears normal.  Pancreas:  Dilated pancreatic duct seen in the pancreatic body measuring 7 mm.  Pancreatic head not visualized.  Spleen:  Within normal limits in size and echotexture.  Right kidney:  Normal in size and parenchymal echogenicity.  No evidence of mass or hydronephrosis.  Left kidney:  Normal in size and parenchymal echogenicity.  No evidence of mass or hydronephrosis.  Abdominal Aorta:  No aneurysm identified.  IMPRESSION:  1.  Cholelithiasis.  No sonographic signs of acute cholecystitis. 2.  Biliary and pancreatic ductal dilatation.  Etiology not visualized by ultrasound.  Pancreatic head mass or choledocholithiasis cannot be excluded.  Consider abdomen CT without and with contrast (pancreatic mass protocol) for further evaluation. 3.  Hepatic steatosis or cirrhosis.  Original Report Authenticated By: Danae Orleans, M.D.    Medications: I have reviewed the patient's current medications. Scheduled Meds:    . aspirin  325 mg Oral BID  . docusate sodium  100 mg Oral BID  . fenofibrate  160 mg Oral Daily  . gabapentin  800 mg Oral TID  .  HYDROmorphone (DILAUDID) injection  2 mg Intravenous Once  . insulin aspart  0-15 Units Subcutaneous TID AC & HS  . insulin aspart protamine-insulin aspart  75 Units Subcutaneous Q supper  . insulin aspart protamine-insulin aspart  80 Units Subcutaneous Q breakfast  . pantoprazole  40 mg Oral Q0600  . piperacillin-tazobactam (ZOSYN)  IV  3.375 g Intravenous Q8H  . senna  1 tablet Oral BID  . simvastatin  40 mg Oral q1800  . sodium chloride  3 mL Intravenous Q12H   Continuous Infusions:    . DISCONTD: sodium chloride 1,000 mL (09/18/11 0803)   PRN Meds:.acetaminophen, acetaminophen, HYDROmorphone  (DILAUDID) injection, ondansetron (ZOFRAN) IV, ondansetron, oxyCODONE, polyethylene glycol Assessment/Plan: Patient Active Hospital Problem List: Exophytic mass on the kidney   Assessment: Radiologic studies shows an exophytic mass on the kidney. In light of the patient's bacteremic state assessment this will have to be deferred at this  time. I last primary care physician Dr. Caryl Never to pursue this with the patient is in a convalescent state from his infection.  Bacteremia due to E. Coli   Assessment:E.coli susceptible to Zosyn. Pt on day #5 will continue antibiotics for a total of 14 days.  Sepsis (09/14/2011)   Assessment: Resolved    Plan: On Zosyn day#5. Continue.  Hyponatremia   Assessment: Will change IVF to 0.9NS.  DM w/o complication type II (07/24/2008)   Assessment:Pt back on diet. Will consider resuming oral medications   DIABETIC FOOT ULCER, TOE (08/01/2008)   Assessment: The ulcer appears to be healing well.In the meantime I will continue the dressing with iodoform gauze to the wound covered with a dry dressing.     GALLSTONES (03/23/2010)   Assessment: Pt with choledocholithiasis and ascending cholangitis.     RENAL INSUFFICIENCY, CHRONIC (07/24/2008)   Assessment: Patient has chronic renal insufficiency his baseline creatinine is unknown he'll contact his primary care physician to obtain those records to    Ascending cholangitis (09/14/2011)   Assessment: Failed ERCP attempt. Status post external biliary drain placement. I will speak with gastroenterology and surgery tomorrow regarding further plans for her the patient be on the external biliary drain.    Anticipate that unless there further plans for surgery during this hospitalization the patient will be able to be discharged within the next 24-48 hours.  LOS: 5 days

## 2011-09-20 ENCOUNTER — Inpatient Hospital Stay (HOSPITAL_COMMUNITY): Payer: Medicare Other

## 2011-09-20 ENCOUNTER — Encounter (HOSPITAL_COMMUNITY): Payer: Self-pay | Admitting: Gastroenterology

## 2011-09-20 DIAGNOSIS — N189 Chronic kidney disease, unspecified: Secondary | ICD-10-CM

## 2011-09-20 DIAGNOSIS — E118 Type 2 diabetes mellitus with unspecified complications: Secondary | ICD-10-CM

## 2011-09-20 DIAGNOSIS — E1165 Type 2 diabetes mellitus with hyperglycemia: Secondary | ICD-10-CM

## 2011-09-20 DIAGNOSIS — K8309 Other cholangitis: Secondary | ICD-10-CM

## 2011-09-20 DIAGNOSIS — R7881 Bacteremia: Secondary | ICD-10-CM

## 2011-09-20 LAB — COMPREHENSIVE METABOLIC PANEL
ALT: 43 U/L (ref 0–53)
AST: 25 U/L (ref 0–37)
CO2: 24 mEq/L (ref 19–32)
Calcium: 8.7 mg/dL (ref 8.4–10.5)
Chloride: 102 mEq/L (ref 96–112)
GFR calc non Af Amer: 40 mL/min — ABNORMAL LOW (ref >90)
Sodium: 134 mEq/L — ABNORMAL LOW (ref 135–145)

## 2011-09-20 LAB — DIFFERENTIAL
Basophils Absolute: 0 10*3/uL (ref 0.0–0.1)
Basophils Relative: 1 % (ref 0–1)
Eosinophils Absolute: 0.5 10*3/uL (ref 0.0–0.7)
Eosinophils Relative: 7 % — ABNORMAL HIGH (ref 0–5)
Lymphocytes Relative: 22 % (ref 12–46)
Lymphs Abs: 1.5 10*3/uL (ref 0.7–4.0)
Monocytes Absolute: 0.6 10*3/uL (ref 0.1–1.0)
Monocytes Relative: 9 % (ref 3–12)
Neutro Abs: 4.1 10*3/uL (ref 1.7–7.7)
Neutrophils Relative %: 61 % (ref 43–77)

## 2011-09-20 LAB — GLUCOSE, CAPILLARY
Glucose-Capillary: 192 mg/dL — ABNORMAL HIGH (ref 70–99)
Glucose-Capillary: 163 mg/dL — ABNORMAL HIGH (ref 70–99)
Glucose-Capillary: 90 mg/dL (ref 70–99)
Glucose-Capillary: 41 mg/dL — CL (ref 70–99)
Glucose-Capillary: 123 mg/dL — ABNORMAL HIGH (ref 70–99)
Glucose-Capillary: 53 mg/dL — ABNORMAL LOW (ref 70–99)
Glucose-Capillary: 81 mg/dL (ref 70–99)

## 2011-09-20 LAB — CULTURE, ROUTINE-ABSCESS
Gram Stain: NONE SEEN
Special Requests: NORMAL

## 2011-09-20 LAB — CBC
Hemoglobin: 9.3 g/dL — ABNORMAL LOW (ref 13.0–17.0)
MCH: 24 pg — ABNORMAL LOW (ref 26.0–34.0)
MCHC: 31.3 g/dL (ref 30.0–36.0)
MCV: 76.5 fL — ABNORMAL LOW (ref 78.0–100.0)

## 2011-09-20 MED ORDER — HYDROMORPHONE HCL 4 MG PO TABS
4.0000 mg | ORAL_TABLET | ORAL | Status: DC | PRN
  Administered 2011-09-20 – 2011-09-21 (×3): 4 mg via ORAL
  Filled 2011-09-20 (×3): qty 1

## 2011-09-20 MED ORDER — INSULIN ASPART PROT & ASPART (70-30 MIX) 100 UNIT/ML ~~LOC~~ SUSP
25.0000 [IU] | Freq: Once | SUBCUTANEOUS | Status: AC
  Administered 2011-09-20: 25 [IU] via SUBCUTANEOUS
  Filled 2011-09-20: qty 3

## 2011-09-20 NOTE — Progress Notes (Signed)
Subjective: Patient's wife present at bedside today. She is very angry that she seems to be getting mixed messages about his discharge. It seems however the patient had some acute confusion with the use of oxycodone last night and may have misinterpreted the information given to him over the last 12 hours. Dr. Luisa Hart from surgery he has spoken at length with the patient and his wife about the plan. I reinforced to the patient and his wife the following points: 1. The patient does not need to remain within the hospital to complete antibiotics for his Escherichia coli bacteremia. 2. The plan is for a cholangiogram and if any abnormalities we'll proceed to an MRCP and further decisions to be made beyond that. 3. We will list all- "Codone" medications as having adverse effects, and will try oral hydromorphone for pain control. Objective: Filed Vitals:   09/19/11 1327 09/19/11 2200 09/20/11 0600 09/20/11 1409  BP: 121/67 130/79 120/67 151/65  Pulse:  74 76 86  Temp: 98.3 F (36.8 C) 98 F (36.7 C) 98 F (36.7 C) 98.8 F (37.1 C)  TempSrc: Oral Oral Oral Oral  Resp: 18 18 18 18   Height:      Weight:   127.597 kg (281 lb 4.8 oz)   SpO2: 97% 95% 93% 100%   Weight change: 1.043 kg (2 lb 4.8 oz)  Intake/Output Summary (Last 24 hours) at 09/20/11 1429 Last data filed at 09/20/11 0444  Gross per 24 hour  Intake 1863.72 ml  Output    675 ml  Net 1188.72 ml    General: Alert, awake, oriented x3, in no acute distress.  HEENT: [place]/AT PEER[place], EOMI. Icterus almost resolved Neck: Trachea midline,  no masses, no thyromegal,y no JVD, no carotid bruit OROPHARYNX:  Moist, No exudate/ erythema/lesions.  Heart: Regular rate and rhythm, without murmurs, rubs, gallops, PMI non-displaced, no heaves or thrills on palpation.  Lungs: Clear to auscultation, no wheezing or rhonchi noted. No increased vocal fremitus resonant to percussion  Abdomen: Soft, mild epigastric tenderness, nondistended, positive bowel  sounds, no masses no hepatosplenomegaly noted..  Neuro: No focal neurological deficits noted cranial nerves II through XII grossly intact. DTRs 2+ bilaterally upper and lower extremities. Strength normal in bilateral upper and lower extremities. Musculoskeletal: No warm swelling or erythema around joints, no spinal tenderness noted.   Lab Results:  [facility] 09/20/11 0505 09/19/11 0541  NA 134* 131*  K 4.3 4.2  CL 102 99  CO2 24 23  GLUCOSE 198* 202*  BUN 18 20  CREATININE 1.66* 1.59*  CALCIUM 8.7 8.5  MG -- --  PHOS -- --    Basename 09/20/11 0505 09/18/11 0348  AST 25 46*  ALT 43 93*  ALKPHOS 187* 314*  BILITOT 2.3* 3.8*  PROT 6.0 6.2  ALBUMIN 2.3* 2.5*   No results found for this basename: LIPASE:2,AMYLASE:2 in the last 72 hours  Basename 09/20/11 0505 09/19/11 0541  WBC 6.7 10.1  NEUTROABS 4.1 6.4  HGB 9.3* 9.3*  HCT 29.7* 29.3*  MCV 76.5* 75.9*  PLT 294 250   No results found for this basename: CKTOTAL:3,CKMB:3,CKMBINDEX:3,TROPONINI:3 in the last 72 hours No components found with this basename: POCBNP:3 No results found for this basename: DDIMER:2 in the last 72 hours No results found for this basename: HGBA1C:2 in the last 72 hours No results found for this basename: CHOL:2,HDL:2,LDLCALC:2,TRIG:2,CHOLHDL:2,LDLDIRECT:2 in the last 72 hours No results found for this basename: TSH,T4TOTAL,FREET3,T3FREE,THYROIDAB in the last 72 hours No results found for this basename: VITAMINB12:2,FOLATE:2,FERRITIN:2,TIBC:2,IRON:2,RETICCTPCT:2 in  the last 72 hours  Micro Results: Recent Results (from the past 240 hour(s))  CULTURE, BLOOD (ROUTINE X 2)     Status: Normal (Preliminary result)   Collection Time   09/14/11  5:07 PM      Component Value Range Status Comment   Specimen Description BLOOD RIGHT HAND   Final    Special Requests BOTTLES DRAWN AEROBIC ONLY 5 CC   Final    Culture  Setup Time [id number]   Final    Culture     Final    Value:        BLOOD CULTURE  RECEIVED NO GROWTH TO DATE CULTURE WILL BE HELD FOR 5 DAYS BEFORE ISSUING A FINAL NEGATIVE REPORT   Report Status PENDING   Incomplete   CULTURE, BLOOD (ROUTINE X 2)     Status: Normal   Collection Time   09/14/11  5:07 PM      Component Value Range Status Comment   Specimen Description BLOOD RIGHT ANTECUBITAL   Final    Special Requests BOTTLES DRAWN AEROBIC AND ANAEROBIC [facility]   Final    Culture  Setup Time [id number]   Final    Culture     Final    Value: ESCHERICHIA COLI     Note: Gram Stain Report Called to,Read Back By and Verified With: VEE GRAVES @ 2135 ON 09/15/2011 HAJAM   Report Status 09/17/2011 FINAL   Final    Organism ID, Bacteria ESCHERICHIA COLI   Final   MRSA PCR SCREENING     Status: Normal   Collection Time   09/15/11 12:10 AM      Component Value Range Status Comment   MRSA by PCR NEGATIVE  NEGATIVE  Final   CULTURE, ROUTINE-ABSCESS     Status: Normal   Collection Time   09/17/11  4:25 PM      Component Value Range Status Comment   Specimen Description GALL BLADDER   Final    Special Requests Normal   Final    Gram Stain     Final    Value: NO WBC SEEN     NO SQUAMOUS EPITHELIAL CELLS SEEN     NO ORGANISMS SEEN   Culture ABUNDANT ESCHERICHIA COLI   Final    Report Status 09/20/2011 FINAL   Final    Organism ID, Bacteria ESCHERICHIA COLI   Final     Studies/Results: Ct Abdomen Pelvis W Wo Contrast  09/14/2011  *RADIOLOGY REPORT*  Clinical Data: Right upper quadrant abdominal pain.  History gallstones and pancreatitis.  CT ABDOMEN AND PELVIS WITHOUT AND WITH CONTRAST  Technique:  Multidetector CT imaging of the abdomen and pelvis was performed without contrast material in one or both body regions, followed by contrast material(s) and further sections in one or both body regions.  Contrast: 80mL OMNIPAQUE IOHEXOL 300 MG/ML  SOLN  Comparison: 09/14/2011 ultrasound; CT scan from 02/05/2007  Findings: Punctate calcifications in the liver and spleen are probably  from old granulomatous disease.  There is mild atelectasis in the lingula, left lower lobe, and posterior basal segment right lower lobe.  Several punctate calcifications present in the pancreatic body and head.  Mild peripancreatic stranding noted, particularly in the pancreatic head region.  Dependent gallstones noted in the gallbladder.  In the common bile duct on image 48 of series 3 and also probably in the vicinity of the ampulla, focal high densities favor choledocholithiasis.  Pancreatic atrophy noted.  Porta hepatis nodes measure up to 10  mm in short axis.  Small peripancreatic lymph nodes are also present, measuring up to about 10 mm in short axis.  There is an indistinct 3.4 x 2.7 cm region of hypodensity inferiorly in the lateral segment left hepatic lobe which is technically nonspecific and visible precontrast and postcontrast images.  This may reflect focal fatty infiltration although underlying lesion is difficult to exclude.  No definite lesion was visible on this position on the prior exam from 2008.  Left adrenal gland mass measures 1.8 x 1.6 cm, technically nonspecific based on the imaging characteristics.  Atherosclerotic calcification of the abdominal aorta is present.  Abnormal exophytic lesion of the left kidney lower pole is enhancing and suspicious for a renal cell carcinoma.  No tumor thrombus in the left renal vein is observed.  This mass may measure up to 5 cm in diameter.  Lumbar spondylosis and degenerative disc disease noted, with right foraminal stenosis at L5-S1.  The urinary bladder appears unremarkable.  IMPRESSION:  1.  Suspected choledocholithiasis with several stones in the common bile duct as a probable cause for the biliary dilatation. 2.  Exophytic enhancing mass in the right kidney lower pole, approximately 5 cm in diameter, probably a renal cell carcinoma. 3.  Indistinct low density lesion inferiorly in the lateral segment left hepatic lobe may represent fatty infiltration  given the geographic appearance, but is essentially nonspecific.  MRI characterization is recommended using dynamic hepatic protocol with feasible. 4.  Nonspecific small left adrenal mass. 5.  Cholelithiasis. 6.  Old granulomatous disease. 7.  Mild findings of chronic pancreatitis.  Pancreatic atrophy. 8.  Mild peripancreatic stranding especially along the pancreatic head - pancreatitis is not excluded.  No acute fluid collections noted. 9.  Atherosclerosis. 10.  Lumbar spondylosis and degenerative disc disease, with right foraminal stenosis at L5-S1.  Original Report Authenticated By: Dellia Cloud, M.D.   US Abdomen Complete  09/14/2011  *RADIOLOGY REPORT*  Clinical Data:  Abdominal pain.  ABDOMINAL ULTRASOUND COMPLETE  Comparison:  None.  Findings:  Gallbladder:  Multiple small less than 1 cm gallstones are seen. No evidence of gallbladder dilatation wall thickening.  Common Bile Duct:  Dilated measuring 9 mm.  Intrahepatic bile ducts also appear dilated in the left hepatic lobe.  Liver: Coarse increased echotexture, consistent with steatosis or cirrhosis.  No focal liver mass identified.  IVC:  Appears normal.  Pancreas:  Dilated pancreatic duct seen in the pancreatic body measuring 7 mm.  Pancreatic head not visualized.  Spleen:  Within normal limits in size and echotexture.  Right kidney:  Normal in size and parenchymal echogenicity.  No evidence of mass or hydronephrosis.  Left kidney:  Normal in size and parenchymal echogenicity.  No evidence of mass or hydronephrosis.  Abdominal Aorta:  No aneurysm identified.  IMPRESSION:  1.  Cholelithiasis.  No sonographic signs of acute cholecystitis. 2.  Biliary and pancreatic ductal dilatation.  Etiology not visualized by ultrasound.  Pancreatic head mass or choledocholithiasis cannot be excluded.  Consider abdomen CT without and with contrast (pancreatic mass protocol) for further evaluation. 3.  Hepatic steatosis or cirrhosis.  Original Report  Authenticated By: Danae Orleans, M.D.   Ir Perc Cholecystostomy  09/17/2011  *RADIOLOGY REPORT*  Clinical Data:  Cholelithiasis, cholecystitis and cholangitis with possible choledocholithiasis by imaging.  The patient is not currently a good candidate for cholecystectomy and request has been made to place a cholecystostomy tube.  PERCUTANEOUS CHOLECYSTOSTOMY  Comparison:  CT of the abdomen and ultrasound of  the abdomen on 09/14/2011.  Sedation:  2.0 mg IV Versed; 100 mcg IV Fentanyl.  Total Moderate Sedation Time: 12 minutes.  Contrast:  10 ml Omnipaque 300  The patient was receiving a dose of IV Zosyn as the procedure was started and therefore additional prophylactic antibiotics were not given prior to the procedure.  Fluoroscopy Time: 0.7 minutes.  Procedure:  The procedure, risks, benefits, and alternatives were explained to the patient.  Questions regarding the procedure were encouraged and answered.  The patient understands and consents to the procedure.  The right abdominal wall was prepped with Betadine in a sterile fashion, and a sterile drape was applied covering the operative field.  A sterile gown and sterile gloves were used for the procedure. Local anesthesia was provided with 1% Lidocaine. Ultrasound image documentation was performed.  Fluoroscopy during the procedure and fluoro spot radiograph confirms appropriate catheter position.  Ultrasound was utilized to localize the gallbladder.  Under direct ultrasound guidance, an 18 gauge needle was advanced via a transhepatic approach into the gallbladder lumen.  Aspiration was performed and a bile sample sent for culture studies.  A small amount of diluted contrast material was injected.  A guide wire was then advanced into the gallbladder.  Percutaneous tract dilatation was then performed over a guide wire to 10-French.  A 10-French pigtail drainage catheter was then advanced into the gallbladder lumen under fluoroscopy.  Catheter was formed and  injected with contrast material to confirm position. The catheter was flushed and connected to a gravity drainage bag. It was secured at the skin with a Prolene retention suture and Stat- Lock device.  Complications:  None  Findings:  After needle puncture of the gallbladder, a bile sample was aspirated and sent for culture.  The bile was mildly turbid and also foul-smelling.  The cholecystostomy tube was advanced into the gallbladder lumen and formed.  It is now draining bile.  This tube will be left to gravity drainage.  IMPRESSION: Percutaneous cholecystostomy with placement of 10-French drainage catheter into the gallbladder lumen.  This was left to gravity drainage.  Original Report Authenticated By: Reola Calkins, M.D.    Medications: I have reviewed the patient's current medications. Scheduled Meds:   . aspirin  325 mg Oral BID  . docusate sodium  100 mg Oral BID  . fenofibrate  160 mg Oral Daily  . gabapentin  800 mg Oral TID  . insulin aspart  0-15 Units Subcutaneous TID AC & HS  . insulin aspart protamine-insulin aspart  75 Units Subcutaneous Q supper  . insulin aspart protamine-insulin aspart  80 Units Subcutaneous Q breakfast  . pantoprazole  40 mg Oral Q0600  . piperacillin-tazobactam (ZOSYN)  IV  3.375 g Intravenous Q8H  . senna  1 tablet Oral BID  . simvastatin  40 mg Oral q1800  . sodium chloride  3 mL Intravenous Q12H   Continuous Infusions:   . DISCONTD: sodium chloride 1,000 mL (09/18/11 0803)   PRN Meds:.acetaminophen, acetaminophen, HYDROmorphone (DILAUDID) injection, HYDROmorphone, ondansetron (ZOFRAN) IV, ondansetron, polyethylene glycol, DISCONTD:  HYDROmorphone (DILAUDID) injection, DISCONTD: oxyCODONE Assessment/Plan: Patient Active Hospital Problem List: Bacteremia due to Escherichia coli (09/18/2011)   Assessment: Patient on t day #6 of Zosyn. We'll continue IV Zosyn and convert to oral antibiotic at the time of discharge to complete 14 days.     DM w/o  complication type II (07/24/2008)   Assessment: Blood sugars well-controlled     DIABETIC FOOT ULCER, TOE (08/01/2008)   Assessment: Continue  local wound care   GALLSTONES (03/23/2010)   Assessment: Patient status post biliary drain placement. I spoken with Dr. Luisa Hart from surgery. There is a possibility the patient may have passed stone however without the diagnostic study to prove it is uncertain. The plan is for the patient to have a cholangiogram today. If this is not normal then we will proceed to an MRCP for further evaluation.    RENAL INSUFFICIENCY, CHRONIC (07/24/2008)   Assessment: Patient at baseline creatinine.   Sepsis (09/14/2011)   Assessment: Resolved with treatment    Ascending cholangitis (09/14/2011)   Assessment: See above      LOS: 6 days

## 2011-09-20 NOTE — Clinical Social Work Psychosocial (Unsigned)
     Clinical Social Work Department BRIEF PSYCHOSOCIAL ASSESSMENT 09/20/2011  Patient:  Frank Ashley, Frank Ashley     Account Number:  [id number]     Admit date:  09/14/2011  Clinical Social Worker:  Hattie Perch  Date/Time:  09/20/2011 12:00 M  Referred by:  Physician  Date Referred:  09/20/2011 Referred for  SNF Placement   Other Referral:   Interview type:  Patient Other interview type:    PSYCHOSOCIAL DATA Living Status:  WIFE Admitted from facility:   Level of care:   Primary support name:  Rushie Chestnut Primary support relationship to patient:  SPOUSE Degree of support available:   Good    CURRENT CONCERNS Current Concerns  Post-Acute Placement   Other Concerns:    SOCIAL WORK ASSESSMENT / PLAN CSW met with patient. Patient is alert and oriented x3. patient states that at this time rehab has not been indicated. He states that should they decide to do surgery, he thinks that he may need the rehab. He states that at this time, a final decision regarding surgery has not been made.   Assessment/plan status:   Other assessment/ plan:   Information/referral to community resources:    PATIENTS/FAMILYS RESPONSE TO PLAN OF CARE: Patient is agreeable to SNF should he have the surgery.

## 2011-09-20 NOTE — Progress Notes (Signed)
Patient's blood sugar at this time is 41.  Patient is awake and asymptomatic.  Patient given milk and graham crackers.  Rechecked blood sugar was 123.  Patient eating dinner.  Dr. Ashley Royalty aware.  Patient given 25 units of 70/30 instead of 75 units.  Will continue to monitor.

## 2011-09-20 NOTE — Progress Notes (Signed)
Patient still complaining of pain in right sided abdomen after receiving 1.5mg  of dilaudid at 1600 and 4mg  dilaudid tablet at 1700.  Dr. Ashley Royalty aware and RN told to call the surgeons.  RN spoke to Dr. Rica Records IR radiologist on call.  As per Dr. Rica Records, nothing more can be done for pain from his perspective as everything reported to him by RN sounds like drainage tube is working sufficiently.  RN referred back to attending for pain management.  Will call primary care doctor for further pain management.

## 2011-09-20 NOTE — Progress Notes (Signed)
4 Days Post-Op  Subjective: Feeling some better since drain. Still with epigastric pain - reports prior hx of pancreatitis.  Patient has lots of unanswered questions from his MD's today in regards to his exact diagnosis and management plan.   Objective: Vital signs in last 24 hours: Temp:  [98 F (36.7 C)-98.3 F (36.8 C)] 98 F (36.7 C) (05/20 0600) Pulse Rate:  [74-76] 76  (05/20 0600) Resp:  [18] 18  (05/20 0600) BP: (120-130)/(67-79) 120/67 mmHg (05/20 0600) SpO2:  [93 %-97 %] 93 % (05/20 0600) Weight:  [281 lb 4.8 oz (127.597 kg)] 281 lb 4.8 oz (127.597 kg) (05/20 0600) Last BM Date: 09/17/11  Intake/Output from previous day: 05/19 0701 - 05/20 0700 In: 2223.7 [P.O.:600; I.V.:1623.7] Out: 825 [Urine:325; Drains:500]  PE:  Awake, alert, appears mostly comfortable.  Drain intact with insertion site clean and dry.  Dark bowel with debris noted - positive for air in bag??? Should not be - monitor.  Cultures positive for e.coli.  Draining well.  Abdomen soft, slightly distended with hyperactive bowel sounds.  Tender along anterior stomach at midline with palpation,  Mildly tender at drain insertion site.     Lab Results:   Basename 09/20/11 0505 09/19/11 0541  WBC 6.7 10.1  HGB 9.3* 9.3*  HCT 29.7* 29.3*  PLT 294 250   BMET  Basename 09/20/11 0505 09/19/11 0541  NA 134* 131*  K 4.3 4.2  CL 102 99  CO2 24 23  GLUCOSE 198* 202*  BUN 18 20  CREATININE 1.66* 1.59*  CALCIUM 8.7 8.5   PT/INR  Basename 09/17/11 1245  LABPROT 14.3  INR 1.09    Anti-infectives: Anti-infectives     Start     Dose/Rate Route Frequency Ordered Stop   09/15/11 0600   piperacillin-tazobactam (ZOSYN) IVPB 3.375 g        3.375 g 12.5 mL/hr over 240 Minutes Intravenous 3 times per day 09/15/11 0041     09/14/11 1830   vancomycin (VANCOCIN) IVPB 1000 mg/200 mL premix        1,000 mg 200 mL/hr over 60 Minutes Intravenous  Once 09/14/11 1807 09/14/11 2002   09/14/11 1830    piperacillin-tazobactam (ZOSYN) IVPB 3.375 g        3.375 g 12.5 mL/hr over 240 Minutes Intravenous  Once 09/14/11 1807 09/15/11 0112          Assessment/Plan: Discussed with patient findings on CT, [place] and ERCP.  Patient advised drain to remain minimum of 6 weeks prior to surgical intervention.  Hopeful that pancreatitis type symptoms will resolve now that chole drain is intact and bacteremia resolving.  Advised he needs to continue drain for now and discuss further management plans with surgeon/ medical MD. IR to follow few more days.     LOS: 6 days    Jshaun Abernathy D 09/20/2011

## 2011-09-20 NOTE — Progress Notes (Signed)
PT cbg was 53 at 2200 and bedtime snack was given. Pt CBG increased to 81.  Pt states "feels fine", will continue to monitor.  MCCLAIN, Frank Ashley 09/20/2011 10:40 PM

## 2011-09-20 NOTE — Progress Notes (Signed)
4 Days Post-Op  Subjective: Sore at drain site.  Objective: Vital signs in last 24 hours: Temp:  [98 F (36.7 C)] 98 F (36.7 C) (05/20 0600) Pulse Rate:  [74-76] 76  (05/20 0600) Resp:  [18] 18  (05/20 0600) BP: (120-130)/(67-79) 120/67 mmHg (05/20 0600) SpO2:  [93 %-95 %] 93 % (05/20 0600) Weight:  [281 lb 4.8 oz (127.597 kg)] 281 lb 4.8 oz (127.597 kg) (05/20 0600) Last BM Date: 09/20/11  Intake/Output from previous day: 05/19 0701 - 05/20 0700 In: 2223.7 [P.O.:600; I.V.:1623.7] Out: 825 [Urine:325; Drains:500] Intake/Output this shift:    drain site intact draining bile.  no peritonitis.  not jaundiced.  Lab Results:   Basename 09/20/11 0505 09/19/11 0541  WBC 6.7 10.1  HGB 9.3* 9.3*  HCT 29.7* 29.3*  PLT 294 250   BMET  Basename 09/20/11 0505 09/19/11 0541  NA 134* 131*  K 4.3 4.2  CL 102 99  CO2 24 23  GLUCOSE 198* 202*  BUN 18 20  CREATININE 1.66* 1.59*  CALCIUM 8.7 8.5   PT/INR No results found for this basename: LABPROT:2,INR:2 in the last 72 hours ABG No results found for this basename: PHART:2,PCO2:2,PO2:2,HCO3:2 in the last 72 hours  Studies/Results: No results found.  Anti-infectives: Anti-infectives     Start     Dose/Rate Route Frequency Ordered Stop   09/15/11 0600   piperacillin-tazobactam (ZOSYN) IVPB 3.375 g        3.375 g 12.5 mL/hr over 240 Minutes Intravenous 3 times per day 09/15/11 0041     09/14/11 1830   vancomycin (VANCOCIN) IVPB 1000 mg/200 mL premix        1,000 mg 200 mL/hr over 60 Minutes Intravenous  Once 09/14/11 1807 09/14/11 2002   09/14/11 1830   piperacillin-tazobactam (ZOSYN) IVPB 3.375 g        3.375 g 12.5 mL/hr over 240 Minutes Intravenous  Once 09/14/11 1807 09/15/11 0112          Assessment/Plan: s/p Procedure(s) (LRB): ESOPHAGOGASTRODUODENOSCOPY (EGD) (N/A) Cholangiogram through   Cholecystostomy tube to evaluate CBD.  If not possible,  Will check MRCP.  Follow LFTS.  These are better.  Ideal  situation is to delay surgery in this setting to a later time.  Discussed at great length with patient,  Family and friend.    LOS: 6 days    Frank Ashley A. 09/20/2011

## 2011-09-20 NOTE — Progress Notes (Signed)
Subjective: Tolerating diet. Soreness about percutaneous drain insertion site.  Objective: Vital signs in last 24 hours: Temp:  [98 F (36.7 C)-98.3 F (36.8 C)] 98 F (36.7 C) (05/20 0600) Pulse Rate:  [74-76] 76  (05/20 0600) Resp:  [18] 18  (05/20 0600) BP: (120-130)/(67-79) 120/67 mmHg (05/20 0600) SpO2:  [93 %-97 %] 93 % (05/20 0600) Weight:  [127.597 kg (281 lb 4.8 oz)] 127.597 kg (281 lb 4.8 oz) (05/20 0600) Weight change: 1.043 kg (2 lb 4.8 oz) Last BM Date: 09/20/11  PE: GEN:  NAD HEENT:  Slight jaundice of skin and slight scleral icterus ABD:  Soft, protuberant; RUQ percutaneous cholecystostomy site CDI  Lab Results: CMP     Component Value Date/Time   NA 134* 09/20/2011 0505   K 4.3 09/20/2011 0505   CL 102 09/20/2011 0505   CO2 24 09/20/2011 0505   GLUCOSE 198* 09/20/2011 0505   BUN 18 09/20/2011 0505   CREATININE 1.66* 09/20/2011 0505   CALCIUM 8.7 09/20/2011 0505   PROT 6.0 09/20/2011 0505   ALBUMIN 2.3* 09/20/2011 0505   AST 25 09/20/2011 0505   ALT 43 09/20/2011 0505   ALKPHOS 187* 09/20/2011 0505   BILITOT 2.3* 09/20/2011 0505   GFRNONAA 40* 09/20/2011 0505   GFRAA 47* 09/20/2011 0505   CBC    Component Value Date/Time   WBC 6.7 09/20/2011 0505   RBC 3.88* 09/20/2011 0505   HGB 9.3* 09/20/2011 0505   HCT 29.7* 09/20/2011 0505   PLT 294 09/20/2011 0505   MCV 76.5* 09/20/2011 0505   MCH 24.0* 09/20/2011 0505   MCHC 31.3 09/20/2011 0505   RDW 17.9* 09/20/2011 0505   LYMPHSABS 1.5 09/20/2011 0505   MONOABS 0.6 09/20/2011 0505   EOSABS 0.5 09/20/2011 0505   BASOSABS 0.0 09/20/2011 0505   Assessment:  1.  Elevated LFTs, improving. 2.  Cholangitis sepsis-like syndrome with bacteremia, improving. 3.  Billroth-II gastric anatomy; attempted ERCP failed (unable to find ampulla).  Plan:  1.  Continue antibiotics. 2.  Discussed case with Dr. Luisa Hart; plan on attempted cholangiogram via cholecystostomy tube, with next step in management pending cholangiogram findings. 3.   If choledocholithiasis is seen on cholangiogram, options include tertiary center ERCP versus surgical cholecystectomy with possible CBD exploration.  There is no utility in any reattempts at ERCP here. 4.  Will follow.   Freddy Jaksch 09/20/2011, 1:21 PM

## 2011-09-21 ENCOUNTER — Encounter (HOSPITAL_COMMUNITY): Payer: Self-pay | Admitting: General Surgery

## 2011-09-21 ENCOUNTER — Other Ambulatory Visit (INDEPENDENT_AMBULATORY_CARE_PROVIDER_SITE_OTHER): Payer: Self-pay

## 2011-09-21 DIAGNOSIS — K8309 Other cholangitis: Secondary | ICD-10-CM

## 2011-09-21 DIAGNOSIS — R7881 Bacteremia: Secondary | ICD-10-CM

## 2011-09-21 DIAGNOSIS — K8020 Calculus of gallbladder without cholecystitis without obstruction: Secondary | ICD-10-CM

## 2011-09-21 DIAGNOSIS — E118 Type 2 diabetes mellitus with unspecified complications: Secondary | ICD-10-CM

## 2011-09-21 DIAGNOSIS — E1165 Type 2 diabetes mellitus with hyperglycemia: Secondary | ICD-10-CM

## 2011-09-21 DIAGNOSIS — R197 Diarrhea, unspecified: Secondary | ICD-10-CM

## 2011-09-21 LAB — CBC
HCT: 31.1 % — ABNORMAL LOW (ref 39.0–52.0)
Hemoglobin: 9.5 g/dL — ABNORMAL LOW (ref 13.0–17.0)
WBC: 9.1 10*3/uL (ref 4.0–10.5)

## 2011-09-21 LAB — DIFFERENTIAL
Basophils Absolute: 0 10*3/uL (ref 0.0–0.1)
Basophils Relative: 0 % (ref 0–1)
Lymphocytes Relative: 15 % (ref 12–46)
Monocytes Absolute: 0.7 10*3/uL (ref 0.1–1.0)
Monocytes Relative: 7 % (ref 3–12)
Neutro Abs: 6.7 10*3/uL (ref 1.7–7.7)
Neutrophils Relative %: 73 % (ref 43–77)

## 2011-09-21 LAB — COMPREHENSIVE METABOLIC PANEL
BUN: 16 mg/dL (ref 6–23)
CO2: 25 mEq/L (ref 19–32)
Calcium: 8.2 mg/dL — ABNORMAL LOW (ref 8.4–10.5)
Creatinine, Ser: 1.61 mg/dL — ABNORMAL HIGH (ref 0.50–1.35)
GFR calc Af Amer: 48 mL/min — ABNORMAL LOW (ref >90)
GFR calc non Af Amer: 42 mL/min — ABNORMAL LOW (ref >90)
Glucose, Bld: 253 mg/dL — ABNORMAL HIGH (ref 70–99)
Total Protein: 6.1 g/dL (ref 6.0–8.3)

## 2011-09-21 LAB — CULTURE, BLOOD (ROUTINE X 2): Culture  Setup Time: 201305150142

## 2011-09-21 LAB — GLUCOSE, CAPILLARY: Glucose-Capillary: 243 mg/dL — ABNORMAL HIGH (ref 70–99)

## 2011-09-21 MED ORDER — METRONIDAZOLE 500 MG PO TABS
500.0000 mg | ORAL_TABLET | Freq: Three times a day (TID) | ORAL | Status: DC
  Administered 2011-09-21: 500 mg via ORAL
  Filled 2011-09-21 (×4): qty 1

## 2011-09-21 MED ORDER — DEXTROSE 5 % IV SOLN
1.0000 g | INTRAVENOUS | Status: DC
  Administered 2011-09-21: 1 g via INTRAVENOUS
  Filled 2011-09-21: qty 10

## 2011-09-21 MED ORDER — METRONIDAZOLE 500 MG PO TABS: 500.0000 mg | ORAL_TABLET | Freq: Three times a day (TID) | ORAL | Status: DC

## 2011-09-21 MED ORDER — ACETAMINOPHEN 325 MG PO TABS: 650.0000 mg | ORAL_TABLET | Freq: Four times a day (QID) | ORAL | Status: DC | PRN

## 2011-09-21 MED ORDER — CEPHALEXIN 500 MG PO CAPS: 500.0000 mg | ORAL_CAPSULE | Freq: Four times a day (QID) | ORAL | Status: AC

## 2011-09-21 MED ORDER — METRONIDAZOLE 500 MG PO TABS: 500.0000 mg | ORAL_TABLET | Freq: Three times a day (TID) | ORAL | Status: AC

## 2011-09-21 MED ORDER — HYDROMORPHONE HCL 4 MG PO TABS: 4.0000 mg | ORAL_TABLET | ORAL | Status: DC | PRN

## 2011-09-21 MED ORDER — SENNA 8.6 MG PO TABS: 1.0000 | ORAL_TABLET | Freq: Two times a day (BID) | ORAL | Status: DC

## 2011-09-21 MED ORDER — POLYETHYLENE GLYCOL 3350 17 G PO PACK: 17.0000 g | PACK | Freq: Every day | ORAL | Status: AC | PRN

## 2011-09-21 MED ORDER — DSS 100 MG PO CAPS: 100.0000 mg | ORAL_CAPSULE | Freq: Two times a day (BID) | ORAL | Status: AC

## 2011-09-21 NOTE — Progress Notes (Signed)
5 Days Post-Op  Subjective: Pt doing fairly well; has some mild RUQ discomfort, otherwise stable  Objective: Vital signs in last 24 hours: Temp:  [97.3 F (36.3 C)-98.8 F (37.1 C)] 98.8 F (37.1 C) (05/21 0600) Pulse Rate:  [86-95] 95  (05/21 0600) Resp:  [18-20] 20  (05/21 0600) BP: (138-181)/(49-70) 138/49 mmHg (05/21 0600) SpO2:  [95 %-100 %] 95 % (05/21 0600) Weight:  [280 lb 11.2 oz (127.325 kg)] 280 lb 11.2 oz (127.325 kg) (05/21 0600) Last BM Date: 09/20/11  Intake/Output from previous day: 05/20 0701 - 05/21 0700 In: 120 [P.O.:120] Out: 525 [Drains:525] Intake/Output this shift: Total I/O In: 240 [P.O.:240] Out: -   Gallbladder drain intact, insertion site ok, mildly tender, drain flushed with 10 cc's sterile NS , cx's - e.coli, t bili decreasing  Lab Results:   [facility] 09/21/11 0755 09/20/11 0505  WBC 9.1 6.7  HGB 9.5* 9.3*  HCT 31.1* 29.7*  PLT 302 294   BMET  Basename 09/21/11 0755 09/20/11 0505  NA 132* 134*  K 4.4 4.3  CL 99 102  CO2 25 24  GLUCOSE 253* 198*  BUN 16 18  CREATININE 1.61* 1.66*  CALCIUM 8.2* 8.7   PT/INR No results found for this basename: LABPROT:2,INR:2 in the last 72 hours ABG No results found for this basename: PHART:2,PCO2:2,PO2:2,HCO3:2 in the last 72 hours Results for orders placed during the hospital encounter of 09/14/11  CULTURE, BLOOD (ROUTINE X 2)     Status: Normal   Collection Time   09/14/11  5:07 PM      Component Value Range Status Comment   Specimen Description BLOOD RIGHT HAND   Final    Special Requests BOTTLES DRAWN AEROBIC ONLY 5 CC   Final    Culture  Setup Time [id number]   Final    Culture NO GROWTH 5 DAYS   Final    Report Status 09/21/2011 FINAL   Final   CULTURE, BLOOD (ROUTINE X 2)     Status: Normal   Collection Time   09/14/11  5:07 PM      Component Value Range Status Comment   Specimen Description BLOOD RIGHT ANTECUBITAL   Final    Special Requests BOTTLES DRAWN AEROBIC AND ANAEROBIC [facility]   Final    Culture  Setup Time [id number]   Final    Culture     Final    Value: ESCHERICHIA COLI     Note: Gram Stain Report Called to,Read Back By and Verified With: VEE GRAVES @ 2135 ON 09/15/2011 HAJAM   Report Status 09/17/2011 FINAL   Final    Organism ID, Bacteria ESCHERICHIA COLI   Final   MRSA PCR SCREENING     Status: Normal   Collection Time   09/15/11 12:10 AM      Component Value Range Status Comment   MRSA by PCR NEGATIVE  NEGATIVE  Final   CULTURE, ROUTINE-ABSCESS     Status: Normal   Collection Time   09/17/11  4:25 PM      Component Value Range Status Comment   Specimen Description GALL BLADDER   Final    Special Requests Normal   Final    Gram Stain     Final    Value: NO WBC SEEN     NO SQUAMOUS EPITHELIAL CELLS SEEN     NO ORGANISMS SEEN   Culture ABUNDANT ESCHERICHIA COLI   Final    Report Status 09/20/2011 FINAL   Final  Organism ID, Bacteria ESCHERICHIA COLI   Final    Studies/Results: Ir Cholan Exist Tube  09/20/2011  *RADIOLOGY REPORT*  Clinical Data: Cholangitis and recent cholecystostomy tube placement.  Evaluate for biliary stones.  History of Billroth gastric surgery.  CHOLANGIOGRAM VIA EXISTING CATHETER  Comparison: Abdominal CT 09/14/2011  Findings: The cholecystostomy tube was injected with contrast under fluoroscopy.  A total of 30 ml of dilute contrast was injected for this procedure.  The tube is well positioned within the gallbladder.  Multiple filling defects within the gallbladder consistent with stones. Contrast fills the cystic duct and extrahepatic biliary system. Mild filling of the intrahepatic ducts.  Filling defects within the common hepatic duct and common bile duct consistent with stones. Contrast never drains into the duodenum. There are stones within the central left hepatic ducts.  IMPRESSION: Cholelithiasis and choledocholithiasis.  Contrast was not identified within the duodenum and suspect there is a distal common bile duct stone  and obstruction.  Original Report Authenticated By: Richarda Overlie, M.D.    Anti-infectives: Anti-infectives     Start     Dose/Rate Route Frequency Ordered Stop   09/21/11 0900   cefTRIAXone (ROCEPHIN) 1 g in dextrose 5 % 50 mL IVPB        1 g 100 mL/hr over 30 Minutes Intravenous Every 24 hours 09/21/11 0747     09/21/11 0900   metroNIDAZOLE (FLAGYL) tablet 500 mg        500 mg Oral 3 times per day 09/21/11 0747     09/15/11 0600   piperacillin-tazobactam (ZOSYN) IVPB 3.375 g  Status:  Discontinued        3.375 g 12.5 mL/hr over 240 Minutes Intravenous 3 times per day 09/15/11 0041 09/21/11 0747   09/14/11 1830   vancomycin (VANCOCIN) IVPB 1000 mg/200 mL premix        1,000 mg 200 mL/hr over 60 Minutes Intravenous  Once 09/14/11 1807 09/14/11 2002   09/14/11 1830   piperacillin-tazobactam (ZOSYN) IVPB 3.375 g        3.375 g 12.5 mL/hr over 240 Minutes Intravenous  Once 09/14/11 1807 09/15/11 0112          Assessment/Plan: S/p perc cholecystostomy 5/17; cont antbx; cont drain; pt instructed on how to flush drain; rec once daily flushing of drain with 5cc's sterile NS, prn dressing changes and recording of output; other plans as outlined by Dr. Luisa Hart    LOS: 7 days    Mehar Kirkwood,D Caryn Bee 09/21/2011

## 2011-09-21 NOTE — Progress Notes (Addendum)
ANTIBIOTIC CONSULT NOTE - FOLLOW UP  Pharmacy Consult for Zosyn Indication: Sepsis  Allergies  Allergen Reactions  . Hydrocodone     codone family drug  . Sulfamethoxazole     REACTION: hives    Patient Measurements: Height: 6' (182.9 cm) Weight: 280 lb 11.2 oz (127.325 kg) IBW/kg (Calculated) : 77.6   Vital Signs: Temp: 98.8 F (37.1 C) (05/21 0600) Temp src: Oral (05/21 0600) BP: 138/49 mmHg (05/21 0600) Pulse Rate: 95  (05/21 0600) Intake/Output from previous day: 05/20 0701 - 05/21 0700 In: 120 [P.O.:120] Out: 525 [Drains:525] Intake/Output from this shift: Total I/O In: 240 [P.O.:240] Out: -   Labs:  Basename 09/21/11 0755 09/20/11 0505 09/19/11 0541  WBC 9.1 6.7 10.1  HGB 9.5* 9.3* 9.3*  PLT 302 294 250  LABCREA -- -- --  CREATININE 1.61* 1.66* 1.59*   Estimated Creatinine Clearance: 58.9 ml/min (by C-G formula based on Cr of 1.61). No results found for this basename: VANCOTROUGH:2,VANCOPEAK:2,VANCORANDOM:2,GENTTROUGH:2,GENTPEAK:2,GENTRANDOM:2,TOBRATROUGH:2,TOBRAPEAK:2,TOBRARND:2,AMIKACINPEAK:2,AMIKACINTROU:2,AMIKACIN:2, in the last 72 hours   Microbiology: Recent Results (from the past 720 hour(s))  CULTURE, BLOOD (ROUTINE X 2)     Status: Normal (Preliminary result)   Collection Time   09/14/11  5:07 PM      Component Value Range Status Comment   Specimen Description BLOOD RIGHT HAND   Final    Special Requests BOTTLES DRAWN AEROBIC ONLY 5 CC   Final    Culture  Setup Time [id number]   Final    Culture     Final    Value:        BLOOD CULTURE RECEIVED NO GROWTH TO DATE CULTURE WILL BE HELD FOR 5 DAYS BEFORE ISSUING A FINAL NEGATIVE REPORT   Report Status PENDING   Incomplete   CULTURE, BLOOD (ROUTINE X 2)     Status: Normal   Collection Time   09/14/11  5:07 PM      Component Value Range Status Comment   Specimen Description BLOOD RIGHT ANTECUBITAL   Final    Special Requests BOTTLES DRAWN AEROBIC AND ANAEROBIC [facility]   Final    Culture  Setup  Time [id number]   Final    Culture     Final    Value: ESCHERICHIA COLI     Note: Gram Stain Report Called to,Read Back By and Verified With: VEE GRAVES @ 2135 ON 09/15/2011 HAJAM   Report Status 09/17/2011 FINAL   Final    Organism ID, Bacteria ESCHERICHIA COLI   Final   MRSA PCR SCREENING     Status: Normal   Collection Time   09/15/11 12:10 AM      Component Value Range Status Comment   MRSA by PCR NEGATIVE  NEGATIVE  Final   CULTURE, ROUTINE-ABSCESS     Status: Normal   Collection Time   09/17/11  4:25 PM      Component Value Range Status Comment   Specimen Description GALL BLADDER   Final    Special Requests Normal   Final    Gram Stain     Final    Value: NO WBC SEEN     NO SQUAMOUS EPITHELIAL CELLS SEEN     NO ORGANISMS SEEN   Culture ABUNDANT ESCHERICHIA COLI   Final    Report Status 09/20/2011 FINAL   Final    Organism ID, Bacteria ESCHERICHIA COLI   Final     Anti-infectives     Start     Dose/Rate Route Frequency Ordered Stop   09/21/11  0900   cefTRIAXone (ROCEPHIN) 1 g in dextrose 5 % 50 mL IVPB        1 g 100 mL/hr over 30 Minutes Intravenous Every 24 hours 09/21/11 0747     09/21/11 0900   metroNIDAZOLE (FLAGYL) tablet 500 mg        500 mg Oral 3 times per day 09/21/11 0747     09/15/11 0600   piperacillin-tazobactam (ZOSYN) IVPB 3.375 g  Status:  Discontinued        3.375 g 12.5 mL/hr over 240 Minutes Intravenous 3 times per day 09/15/11 0041 09/21/11 0747   09/14/11 1830   vancomycin (VANCOCIN) IVPB 1000 mg/200 mL premix        1,000 mg 200 mL/hr over 60 Minutes Intravenous  Once 09/14/11 1807 09/14/11 2002   09/14/11 1830  piperacillin-tazobactam (ZOSYN) IVPB 3.375 g       3.375 g 12.5 mL/hr over 240 Minutes Intravenous  Once 09/14/11 1807 09/15/11 0112          Assessment: 71 yo M on Day # 8 Zosyn EI for sepsis secondary to gallbladder infection. Cx from blood and abscess growing E.coli (sens to Zosyn). Dose remains appropriate for renal  function.  MD note indicates plan pending cholangiogram results  Plan:  Continue current Zosyn regimen. What is plan for descalation of therapy or change to PO? PO cephalosporins are best available option per cultures if possible change to PO  Hessie Knows, PharmD, BCPS Pager [phone] 09/21/2011 9:07 AM   --------------------------------------------------------------------------------------------------------------------------------------------------------------------------- Addendum: Note that Abx's changed to Rocephin and Flagyl as this note was being written.    Hessie Knows, PharmD, BCPS Pager [phone] 09/21/2011 9:13 AM

## 2011-09-21 NOTE — Progress Notes (Signed)
5 Days Post-Op  Subjective: No complaints  Objective: Vital signs in last 24 hours: Temp:  [97.3 F (36.3 C)-98.8 F (37.1 C)] 98.8 F (37.1 C) (05/21 0600) Pulse Rate:  [86-95] 95  (05/21 0600) Resp:  [18-20] 20  (05/21 0600) BP: (138-181)/(49-70) 138/49 mmHg (05/21 0600) SpO2:  [95 %-100 %] 95 % (05/21 0600) Weight:  [280 lb 11.2 oz (127.325 kg)] 280 lb 11.2 oz (127.325 kg) (05/21 0600) Last BM Date: 09/20/11  Intake/Output from previous day: 05/20 0701 - 05/21 0700 In: 120 [P.O.:120] Out: 525 [Drains:525] Intake/Output this shift: Total I/O In: 240 [P.O.:240] Out: -   General appearance: alert, cooperative and no distress GI: drain site ok bile in bag.  sore around site. not jaundiced.  Lab Results:   Basename 09/21/11 0755 09/20/11 0505  WBC 9.1 6.7  HGB 9.5* 9.3*  HCT 31.1* 29.7*  PLT 302 294   BMET  Basename 09/21/11 0755 09/20/11 0505  NA 132* 134*  K 4.4 4.3  CL 99 102  CO2 25 24  GLUCOSE 253* 198*  BUN 16 18  CREATININE 1.61* 1.66*  CALCIUM 8.2* 8.7   PT/INR No results found for this basename: LABPROT:2,INR:2 in the last 72 hours ABG No results found for this basename: PHART:2,PCO2:2,PO2:2,HCO3:2 in the last 72 hours  Studies/Results: Ir Cholan Exist Tube  09/20/2011  *RADIOLOGY REPORT*  Clinical Data: Cholangitis and recent cholecystostomy tube placement.  Evaluate for biliary stones.  History of Billroth gastric surgery.  CHOLANGIOGRAM VIA EXISTING CATHETER  Comparison: Abdominal CT 09/14/2011  Findings: The cholecystostomy tube was injected with contrast under fluoroscopy.  A total of 30 ml of dilute contrast was injected for this procedure.  The tube is well positioned within the gallbladder.  Multiple filling defects within the gallbladder consistent with stones. Contrast fills the cystic duct and extrahepatic biliary system. Mild filling of the intrahepatic ducts.  Filling defects within the common hepatic duct and common bile duct consistent  with stones. Contrast never drains into the duodenum. There are stones within the central left hepatic ducts.  IMPRESSION: Cholelithiasis and choledocholithiasis.  Contrast was not identified within the duodenum and suspect there is a distal common bile duct stone and obstruction.  Original Report Authenticated By: Richarda Overlie, M.D.    Anti-infectives: Anti-infectives     Start     Dose/Rate Route Frequency Ordered Stop   09/21/11 0900   cefTRIAXone (ROCEPHIN) 1 g in dextrose 5 % 50 mL IVPB        1 g 100 mL/hr over 30 Minutes Intravenous Every 24 hours 09/21/11 0747     09/21/11 0900   metroNIDAZOLE (FLAGYL) tablet 500 mg        500 mg Oral 3 times per day 09/21/11 0747     09/15/11 0600   piperacillin-tazobactam (ZOSYN) IVPB 3.375 g  Status:  Discontinued        3.375 g 12.5 mL/hr over 240 Minutes Intravenous 3 times per day 09/15/11 0041 09/21/11 0747   09/14/11 1830   vancomycin (VANCOCIN) IVPB 1000 mg/200 mL premix        1,000 mg 200 mL/hr over 60 Minutes Intravenous  Once 09/14/11 1807 09/14/11 2002   09/14/11 1830   piperacillin-tazobactam (ZOSYN) IVPB 3.375 g        3.375 g 12.5 mL/hr over 240 Minutes Intravenous  Once 09/14/11 1807 09/15/11 0112          Assessment/Plan: s/p Procedure(s) (LRB): ESOPHAGOGASTRODUODENOSCOPY (EGD) (N/A) Pt LFT improved and catheter is  communicating with CBD and providing drainage.  No fever and nl WBC.  Will see next week in office and try to push out 6 weeks to do open cholecystectomy and CBD exploration.  Cont ABX for 7 more days.  I explained that if his condition worsens or changes the plan will change.  My office will contact him for appointment and check LFT.  Ok from surgery stand point to discharge.  Ask IR about drain care prior to discharge.  LOS: 7 days    Frank Methot A. 09/21/2011

## 2011-09-21 NOTE — Progress Notes (Signed)
   CARE MANAGEMENT NOTE 09/21/2011  Patient:  Frank Ashley,Frank Ashley   Account Number:  [id number]  Date Initiated:  09/15/2011  Documentation initiated by:  DAVIS,RHONDA  Subjective/Objective Assessment:   pt with sepsis from gall stones and obstruction,pancreatitis     Action/Plan:   lives at home   Anticipated DC Date:  09/21/2011   Anticipated DC Plan:  HOME/SELF CARE  In-house referral  NA      DC Planning Services  CM consult      [facility] Choice  HOME HEALTH   Choice offered to / List presented to:  C-1 Patient   DME arranged  NA      DME agency  NA     HH arranged  HH-1 RN      [facility] agency  NA   Status of service:  In process, will continue to follow Medicare Important Message given?  NA - LOS <3 / Initial given by admissions (If response is "NO", the following Medicare IM given date fields will be blank) Date Medicare IM given:   Date Additional Medicare IM given:    Discharge Disposition:    Per UR Regulation:  Reviewed for med. necessity/level of care/duration of stay  If discussed at Long Length of Stay Meetings, dates discussed:    Comments:  09-21-11 Frank Ashley, [place] [phone] Spoke with patient and wife at bedside. Concerned about how many visits he will be approved for for [facility] since he will have surgery at a later date and will have certain dc needs at that time per patient. Wanted CM to call surgeon's office to find out appt time and date. Also wanted a benefit check completed on HHC prior to making [facility] agency choice. Follow-up appt with Dr. Luisa Hart on 09-28-11 at 0900. Frank Ashley also reports that he feels comfortable in emptying drain and flushes. Will need saline flush for home as well as canister to empty contents. PCP: Dr Caryl Never. This CM will await benefits check and f/u for [facility] agency choice if indeed needed.     [id number] Earlene Plater, RN, BSN, CCM No discharge needs present at time of this review at the sdu/icu level. Case  Management [phone]

## 2011-09-21 NOTE — Progress Notes (Signed)
Patient ambulated >20 feet, tolerated well, with no equipment. Pt appears to be steady on feet and states "he feels fine"  Pulse 84 and oxygen 97% on RA, normal breathing pattern.   MCCLAIN, Stacey Maura L 09/21/2011 2:19 PM

## 2011-09-21 NOTE — Progress Notes (Signed)
Subjective: No complaints. Ready to go home.  Objective: Vital signs in last 24 hours: Temp:  [97.3 F (36.3 C)-98.8 F (37.1 C)] 98.8 F (37.1 C) (05/21 0600) Pulse Rate:  [86-95] 95  (05/21 0600) Resp:  [18-20] 20  (05/21 0600) BP: (138-181)/(49-70) 138/49 mmHg (05/21 0600) SpO2:  [95 %-100 %] 95 % (05/21 0600) Weight:  [127.325 kg (280 lb 11.2 oz)] 127.325 kg (280 lb 11.2 oz) (05/21 0600) Weight change: -0.272 kg (-9.6 oz) Last BM Date: 09/20/11  PE: GEN:  Overweight HEENT:  Dry mucous membranes; slight scleral icterus ABD:  Protuberant, soft, RUQ Percutaneous drain in place  Lab Results: CBC    Component Value Date/Time   WBC 9.1 09/21/2011 0755   RBC 3.97* 09/21/2011 0755   HGB 9.5* 09/21/2011 0755   HCT 31.1* 09/21/2011 0755   PLT 302 09/21/2011 0755   MCV 78.3 09/21/2011 0755   MCH 23.9* 09/21/2011 0755   MCHC 30.5 09/21/2011 0755   RDW 17.8* 09/21/2011 0755   LYMPHSABS 1.4 09/21/2011 0755   MONOABS 0.7 09/21/2011 0755   EOSABS 0.4 09/21/2011 0755   BASOSABS 0.0 09/21/2011 0755   CMP     Component Value Date/Time   NA 132* 09/21/2011 0755   K 4.4 09/21/2011 0755   CL 99 09/21/2011 0755   CO2 25 09/21/2011 0755   GLUCOSE 253* 09/21/2011 0755   BUN 16 09/21/2011 0755   CREATININE 1.61* 09/21/2011 0755   CALCIUM 8.2* 09/21/2011 0755   PROT 6.1 09/21/2011 0755   ALBUMIN 2.4* 09/21/2011 0755   AST 23 09/21/2011 0755   ALT 32 09/21/2011 0755   ALKPHOS 176* 09/21/2011 0755   BILITOT 2.0* 09/21/2011 0755   GFRNONAA 42* 09/21/2011 0755   GFRAA 48* 09/21/2011 0755    Percutaneous cholangiogram:  Multiple CHD/CBD filling defects; multiple stones in gallbladder (personally reviewed).  Assessment:  1.  Cholangitis, with decompression via percutaneous drain. 2.  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 3.  Cholelithiasis.  Plan:  1.  Continue antibiotics 2.  Surgical note by Dr. Luisa Hart reviewed; cholecystectomy with common bile duct exploration to be planned electively in the next  several weeks. 3.  Will sign off.  Please call with any questions.  Thank you for the consult.   Frank Ashley 09/21/2011, 1:07 PM

## 2011-09-21 NOTE — Discharge Summary (Signed)
Frank Ashley MRN: [id number] DOB/AGE: June 17, 1940 71 y.o.  Admit date: 09/14/2011 Discharge date: 09/21/2011  Primary Care Physician:  Kristian Covey, MD, MD   Discharge Diagnoses:   Patient Active Problem List  Diagnoses  . MRSA  . DM w/o complication type II  . DIABETIC FOOT ULCER, TOE  . HYPERLIPIDEMIA  . OVERWEIGHT  . PERIPHERAL NEUROPATHY  . Allergic rhinitis, cause unspecified  . GERD  . GALLSTONES  . ACUTE PANCREATITIS  . RENAL INSUFFICIENCY, CHRONIC  . SLEEP APNEA  . Sepsis  . Ascending cholangitis  . Bacteremia due to Escherichia coli    DISCHARGE MEDICATION: Medication List  As of 09/21/2011  1:07 PM   STOP taking these medications         ondansetron 8 MG disintegrating tablet         TAKE these medications         1ST CHOICE PEN NEEDLES 31G X 8 MM Misc   Generic drug: Insulin Pen Needle   by Does not apply route.      acetaminophen 325 MG tablet   Commonly known as: TYLENOL   Take 2 tablets (650 mg total) by mouth every 6 (six) hours as needed (or Fever >/= 101).      ALPRAZolam 0.5 MG tablet   Commonly known as: XANAX   Take 0.5 mg by mouth 3 (three) times daily as needed. For anxiety.      aspirin 325 MG EC tablet   Take 325 mg by mouth 2 (two) times daily.      cephALEXin 500 MG capsule   Commonly known as: KEFLEX   Take 1 capsule (500 mg total) by mouth 4 (four) times daily.      DSS 100 MG Caps   Take 100 mg by mouth 2 (two) times daily.      esomeprazole 40 MG capsule   Commonly known as: NEXIUM   Take 40 mg by mouth daily before breakfast.      fenofibrate 160 MG tablet   TAKE ONE TABLET BY MOUTH EVERY DAY      furosemide 40 MG tablet   Commonly known as: LASIX   Take 40 mg by mouth daily as needed. For fluid retention.      gabapentin 800 MG tablet   Commonly known as: NEURONTIN   TAKE 1 TABLET BY MOUTH 5 TIMES DAILY      glucose blood test strip   1 each by Other route as needed. Check fasting blood sugar in the morning  as directed      HYDROmorphone 4 MG tablet   Commonly known as: DILAUDID   Take 1 tablet (4 mg total) by mouth every 4 (four) hours as needed.      insulin NPH-insulin regular (70-30) 100 UNIT/ML injection   Commonly known as: NOVOLIN 70/30   Inject 80  units subcutaneously in the morning and 75 units in the evening as directed      lisinopril 10 MG tablet   Commonly known as: PRINIVIL,ZESTRIL   TAKE ONE TABLET BY MOUTH EVERY DAY      metoCLOPramide 10 MG tablet   Commonly known as: REGLAN   Take 10 mg by mouth 4 (four) times daily. One by mouth qid- AC meals and q hs as needed for nausea and vomiting      metroNIDAZOLE 500 MG tablet   Commonly known as: FLAGYL   Take 1 tablet (500 mg total) by mouth 3 (three) times daily.  oxiconazole 1 % Crea   Commonly known as: OXISTAT   Apply 1 application topically as needed. For jock itch.      polyethylene glycol packet   Commonly known as: MIRALAX / GLYCOLAX   Take 17 g by mouth daily as needed.      RELION INSULIN SYRINGE 29G X 1/2" 1 ML Misc   Generic drug: INS SYRINGE/NEEDLE 1CC/29G   by Does not apply route. Use as directed two times a day      senna 8.6 MG Tabs   Commonly known as: SENOKOT   Take 1 tablet (8.6 mg total) by mouth 2 (two) times daily.      simvastatin 40 MG tablet   Commonly known as: ZOCOR   TAKE ONE TABLET BY MOUTH EVERY DAY      triamcinolone cream 0.1 %   Commonly known as: KENALOG   Apply 1 application topically 2 (two) times daily as needed. For rash on back.              Consults: Treatment Team:  Md Montez Morita, MD Dorena Cookey M.D.-gastroenterology   SIGNIFICANT DIAGNOSTIC STUDIES:  Ct Abdomen Pelvis W Wo Contrast  09/14/2011  *RADIOLOGY REPORT*  Clinical Data: Right upper quadrant abdominal pain.  History gallstones and pancreatitis.  CT ABDOMEN AND PELVIS WITHOUT AND WITH CONTRAST  Technique:  Multidetector CT imaging of the abdomen and pelvis was performed without contrast material in one  or both body regions, followed by contrast material(s) and further sections in one or both body regions.  Contrast: 80mL OMNIPAQUE IOHEXOL 300 MG/ML  SOLN  Comparison: 09/14/2011 ultrasound; CT scan from 02/05/2007  Findings: Punctate calcifications in the liver and spleen are probably from old granulomatous disease.  There is mild atelectasis in the lingula, left lower lobe, and posterior basal segment right lower lobe.  Several punctate calcifications present in the pancreatic body and head.  Mild peripancreatic stranding noted, particularly in the pancreatic head region.  Dependent gallstones noted in the gallbladder.  In the common bile duct on image 48 of series 3 and also probably in the vicinity of the ampulla, focal high densities favor choledocholithiasis.  Pancreatic atrophy noted.  Porta hepatis nodes measure up to 10 mm in short axis.  Small peripancreatic lymph nodes are also present, measuring up to about 10 mm in short axis.  There is an indistinct 3.4 x 2.7 cm region of hypodensity inferiorly in the lateral segment left hepatic lobe which is technically nonspecific and visible precontrast and postcontrast images.  This may reflect focal fatty infiltration although underlying lesion is difficult to exclude.  No definite lesion was visible on this position on the prior exam from 2008.  Left adrenal gland mass measures 1.8 x 1.6 cm, technically nonspecific based on the imaging characteristics.  Atherosclerotic calcification of the abdominal aorta is present.  Abnormal exophytic lesion of the left kidney lower pole is enhancing and suspicious for a renal cell carcinoma.  No tumor thrombus in the left renal vein is observed.  This mass may measure up to 5 cm in diameter.  Lumbar spondylosis and degenerative disc disease noted, with right foraminal stenosis at L5-S1.  The urinary bladder appears unremarkable.  IMPRESSION:  1.  Suspected choledocholithiasis with several stones in the common bile duct as a  probable cause for the biliary dilatation. 2.  Exophytic enhancing mass in the right kidney lower pole, approximately 5 cm in diameter, probably a renal cell carcinoma. 3.  Indistinct low density lesion inferiorly  in the lateral segment left hepatic lobe may represent fatty infiltration given the geographic appearance, but is essentially nonspecific.  MRI characterization is recommended using dynamic hepatic protocol with feasible. 4.  Nonspecific small left adrenal mass. 5.  Cholelithiasis. 6.  Old granulomatous disease. 7.  Mild findings of chronic pancreatitis.  Pancreatic atrophy. 8.  Mild peripancreatic stranding especially along the pancreatic head - pancreatitis is not excluded.  No acute fluid collections noted. 9.  Atherosclerosis. 10.  Lumbar spondylosis and degenerative disc disease, with right foraminal stenosis at L5-S1.  Original Report Authenticated By: Dellia Cloud, M.D.   US Abdomen Complete  09/14/2011  *RADIOLOGY REPORT*  Clinical Data:  Abdominal pain.  ABDOMINAL ULTRASOUND COMPLETE  Comparison:  None.  Findings:  Gallbladder:  Multiple small less than 1 cm gallstones are seen. No evidence of gallbladder dilatation wall thickening.  Common Bile Duct:  Dilated measuring 9 mm.  Intrahepatic bile ducts also appear dilated in the left hepatic lobe.  Liver: Coarse increased echotexture, consistent with steatosis or cirrhosis.  No focal liver mass identified.  IVC:  Appears normal.  Pancreas:  Dilated pancreatic duct seen in the pancreatic body measuring 7 mm.  Pancreatic head not visualized.  Spleen:  Within normal limits in size and echotexture.  Right kidney:  Normal in size and parenchymal echogenicity.  No evidence of mass or hydronephrosis.  Left kidney:  Normal in size and parenchymal echogenicity.  No evidence of mass or hydronephrosis.  Abdominal Aorta:  No aneurysm identified.  IMPRESSION:  1.  Cholelithiasis.  No sonographic signs of acute cholecystitis. 2.  Biliary and pancreatic  ductal dilatation.  Etiology not visualized by ultrasound.  Pancreatic head mass or choledocholithiasis cannot be excluded.  Consider abdomen CT without and with contrast (pancreatic mass protocol) for further evaluation. 3.  Hepatic steatosis or cirrhosis.  Original Report Authenticated By: Danae Orleans, M.D.   Ir Cholan Exist Tube  09/20/2011  *RADIOLOGY REPORT*  Clinical Data: Cholangitis and recent cholecystostomy tube placement.  Evaluate for biliary stones.  History of Billroth gastric surgery.  CHOLANGIOGRAM VIA EXISTING CATHETER  Comparison: Abdominal CT 09/14/2011  Findings: The cholecystostomy tube was injected with contrast under fluoroscopy.  A total of 30 ml of dilute contrast was injected for this procedure.  The tube is well positioned within the gallbladder.  Multiple filling defects within the gallbladder consistent with stones. Contrast fills the cystic duct and extrahepatic biliary system. Mild filling of the intrahepatic ducts.  Filling defects within the common hepatic duct and common bile duct consistent with stones. Contrast never drains into the duodenum. There are stones within the central left hepatic ducts.  IMPRESSION: Cholelithiasis and choledocholithiasis.  Contrast was not identified within the duodenum and suspect there is a distal common bile duct stone and obstruction.  Original Report Authenticated By: Richarda Overlie, M.D.   Ir Perc Cholecystostomy  09/17/2011  *RADIOLOGY REPORT*  Clinical Data:  Cholelithiasis, cholecystitis and cholangitis with possible choledocholithiasis by imaging.  The patient is not currently a good candidate for cholecystectomy and request has been made to place a cholecystostomy tube.  PERCUTANEOUS CHOLECYSTOSTOMY  Comparison:  CT of the abdomen and ultrasound of the abdomen on 09/14/2011.  Sedation:  2.0 mg IV Versed; 100 mcg IV Fentanyl.  Total Moderate Sedation Time: 12 minutes.  Contrast:  10 ml Omnipaque 300  The patient was receiving a dose of IV  Zosyn as the procedure was started and therefore additional prophylactic antibiotics were not given prior to the  procedure.  Fluoroscopy Time: 0.7 minutes.  Procedure:  The procedure, risks, benefits, and alternatives were explained to the patient.  Questions regarding the procedure were encouraged and answered.  The patient understands and consents to the procedure.  The right abdominal wall was prepped with Betadine in a sterile fashion, and a sterile drape was applied covering the operative field.  A sterile gown and sterile gloves were used for the procedure. Local anesthesia was provided with 1% Lidocaine. Ultrasound image documentation was performed.  Fluoroscopy during the procedure and fluoro spot radiograph confirms appropriate catheter position.  Ultrasound was utilized to localize the gallbladder.  Under direct ultrasound guidance, an 18 gauge needle was advanced via a transhepatic approach into the gallbladder lumen.  Aspiration was performed and a bile sample sent for culture studies.  A small amount of diluted contrast material was injected.  A guide wire was then advanced into the gallbladder.  Percutaneous tract dilatation was then performed over a guide wire to 10-French.  A 10-French pigtail drainage catheter was then advanced into the gallbladder lumen under fluoroscopy.  Catheter was formed and injected with contrast material to confirm position. The catheter was flushed and connected to a gravity drainage bag. It was secured at the skin with a Prolene retention suture and Stat- Lock device.  Complications:  None  Findings:  After needle puncture of the gallbladder, a bile sample was aspirated and sent for culture.  The bile was mildly turbid and also foul-smelling.  The cholecystostomy tube was advanced into the gallbladder lumen and formed.  It is now draining bile.  This tube will be left to gravity drainage.  IMPRESSION: Percutaneous cholecystostomy with placement of 10-French drainage  catheter into the gallbladder lumen.  This was left to gravity drainage.  Original Report Authenticated By: Reola Calkins, M.D.     Recent Results (from the past 240 hour(s))  CULTURE, BLOOD (ROUTINE X 2)     Status: Normal   Collection Time   09/14/11  5:07 PM      Component Value Range Status Comment   Specimen Description BLOOD RIGHT HAND   Final    Special Requests BOTTLES DRAWN AEROBIC ONLY 5 CC   Final    Culture  Setup Time [id number]   Final    Culture NO GROWTH 5 DAYS   Final    Report Status 09/21/2011 FINAL   Final   CULTURE, BLOOD (ROUTINE X 2)     Status: Normal   Collection Time   09/14/11  5:07 PM      Component Value Range Status Comment   Specimen Description BLOOD RIGHT ANTECUBITAL   Final    Special Requests BOTTLES DRAWN AEROBIC AND ANAEROBIC [facility]   Final    Culture  Setup Time [id number]   Final    Culture     Final    Value: ESCHERICHIA COLI     Note: Gram Stain Report Called to,Read Back By and Verified With: VEE GRAVES @ 2135 ON 09/15/2011 HAJAM   Report Status 09/17/2011 FINAL   Final    Organism ID, Bacteria ESCHERICHIA COLI   Final   MRSA PCR SCREENING     Status: Normal   Collection Time   09/15/11 12:10 AM      Component Value Range Status Comment   MRSA by PCR NEGATIVE  NEGATIVE  Final   CULTURE, ROUTINE-ABSCESS     Status: Normal   Collection Time   09/17/11  4:25 PM  Component Value Range Status Comment   Specimen Description GALL BLADDER   Final    Special Requests Normal   Final    Gram Stain     Final    Value: NO WBC SEEN     NO SQUAMOUS EPITHELIAL CELLS SEEN     NO ORGANISMS SEEN   Culture ABUNDANT ESCHERICHIA COLI   Final    Report Status 09/20/2011 FINAL   Final    Organism ID, Bacteria ESCHERICHIA COLI   Final     BRIEF ADMITTING H & P: 71yoM with h/o DM2 with peripheral neuropathy, CKD baseline Cr 2.0, h/o  pancreatitis, gallstones, and likely biliary colic, now presents with  sepsis from ascending cholangitis.    Pt states that off and on he's had epigastric pain and has known  gallstones previously noted on ultrasound. Last week, he had an episode  of epigastric pain that resolved on its own, however they went to eat  [place] food on Mother's Day and by that night he had severe epigastric  pain, somewhat in the RUQ, but radiating into just under the right  shoulder blade. He had nausea and vomiting through Sunday and subjective  fevers and chills, for which he saw PCP Dr. Caryl Never on Monday who drew  a normal CBC, minimally elevated lipase, and was given antiemetics. When  symtpoms persisted through Tuesday, came to ED.   In the ED, Tmax 102.7, HR max 133, BP min 87/56, although all more  stable appearing at present. Labs with Na 131, renal 22/1.72, glucose  266, AlkP 240, Lipase 91, AST 242/ALT 232, Tbili 5.8. Lactate 3.7. WBC  only 7.4, rest of CBC stable. UA with glucosuria, bilirubinuria but no  infection. BCx pending x2. Abd u/s done first, showed gallstones, but no  signs cholecystitis, but with biliary and pancreatic ductal dilatation   CTof abdomen and pelvis with/without contrast was done and showed  suspected gallstones with several stones in the CBD as probable cause of  biliary dilatation; also a 5cm exophytic right kidney lesion called as  probably renal cell ca; a low density lesion in left hepatic lobe that  is nonspecific, recommended MRI liver; mild peripancreatic stranding,  cannot r/o pancreatitis; and a handful of other less acute findings. They didn't notice that his eyes are now yellow. He has a  chronic (years and years) right great toe ulcer from DM that is followed  as outpt and not worse.    Hospital Course:  Present on Admission:  .Sepsis: Patient presented to the emergency room with a picture of sepsis physiology. This was secondary to the ascending cholangitis and Escherichia coli bacteremia. The patient was treated with vancomycin and Zosyn initially. Once  the cultures were back his antibiotics spectrum was narrowed to IV Zosyn. I conferred with Dr. Ilsa Iha from infectious diseases and based on the susceptibilities the patient is being discharged home with IV Flagyl and Keflex to complete a 14 day course of therapy.   .Ascending cholangitis: The patient presented with an ascending colon tinnitus. Gastroneurology was consult and attempts were made to do an ERCP which failed secondary to Billroth II anatomy. The general surgeons within consult in with the intention of attempting to place a drain and a failure that occurred then they would have to do a surgical approach. However intubation a radiology was able to place a percutaneous drain for decompression. The patient was continued with supportive care and cholangiogram was performed yesterday through the percutaneous drain which showed that  the cath is communicating with the common bile duct and providing drainage. The patient's LFTs were improved and he showed no fever in the presence of a normal white blood cell count. In light of this surgeon felt that he could be discharged. The plan is to followup with Dr. Luisa Hart general surgery in one week and to perform an open cholecystectomy and common bile duct exploration in approximately 6 weeks.  . Diarrhea: The last 24 hours patient's been having diarrhea. In light of the fact that the patient was on antibiotics an order for C. difficile was placed. However the patient did not save his stool and was unable to give a sample. However the patient is being discharged home on Flagyl for 7 day course which would also cover the treatment of C. difficile. The patient continues to have diarrhea I would recommend that he see his primary care physician and have his stool checked for C. difficile at that time. Thus in light of the fact that the patient has not been able to produce a stool we are discharging the patient home without a seated PCR being done and an apparent  treatment on Flagyl for 7 days as a part of his sepsis treatment.  .DM with complication type II: The patient has diabetes with known complications including chronic kidney disease and diabetic foot ulcers. The patient had been on lisinopril in admission for renal protection however due to his septic state was supposed help. At time of discharge the patient's creatinine is 1.71. In reviewing the patient's record the patient's last admit her creatinine was 2.1. The patient will continue on his prehospital regimen of insulin for management of his diabetes. I will defer to his primary care physician in the outpatient setting to further titrate his medications.  Marland KitchenDIABETIC FOOT ULCER, TOE: The patient has a chronic diabetic foot ulcer on the right great toe. It has been managed with hyperbaric oxygen in the past. And currently the patient is managing his with iodoform into the wound and dry dressing over it. I will defer to Dr. Caryl Never his primary care doctor to further manage him as an outpatient. Please note that the patient has no discharge from the wound and it has been stable and his chronic state during this hospitalization.   Marland KitchenRENAL INSUFFICIENCY, CHRONIC: As noted before the patient has chronic kidney disease his creatinine the time of discharge is 1.71. This is while he's been off of the lisinopril I suspect that he will have some rise in his creatinine with the reduction of lisinopril however it is not enough to prevent use of lisinopril for renal protection.   .Bacteremia due to Escherichia coli: as noted the patient is found to have bacteremia with Escherichia coli. It was sensitive to cephalosporins. The patient is being discharged with Keflex 500 mg 4 times a day to complete a course of 14 days of antibiotics.  . Psychosocial: The patient is very pleasant but clearly very anxious about his current condition and the gravity of the situation. The patient's wife also appears to have transferred her  serum frustrations onto the patient. I tried to explain to them that the patient's condition is an unfolding clinical course and does not have a static plan associated with it. The vessel plan may change based upon the patient's clinical course. I suspect that they will need a lot of psychosocial support to get through this patient's illness in light of his projected surgery.  . Pain: The patient has pain associated  with his cholangitis and appears to have some referred pain to the posterior chest wall. He does have an adverse reaction to "codone" medications and an allergy to sulfas. Due to the cross-reactivity with morphine sulfate and sulfas this has been avoided as the patient has a radiology consult was additionally the patient was treated with Dilaudid in the hospital and does not appear to have had an adverse reaction to this as the patient is being discharged on oral Dilaudid. Other options that could be considered in this patient will include Ultram or fentanyl.  . Kidney mass: CT of the abdomen showed a abnormal exophytic lesion of the left kidney in the lower call which is enhancing which may measure up to 5 cm in diameter and is suspicious for renal cell carcinoma. Due to the severity of the patient's illness this was not pursued during this hospitalization. However I would consider renal biopsy after the patient has completed his antibiotics for further evaluation  Condition at the time of discharge stable  Disposition and Follow-up:  Patient has a followup appointment with Dr. Luisa Hart at 09/28/2011 at 9 AM. Please also schedule appointment with Dr. Caryl Never his primary care physician within one week of discharge.  Discharge Orders    Future Appointments: Provider: Department: Dept Phone: Center:   09/28/2011 9:10 AM Maisie Fus A. Cornett, MD [facility] [phone] None   11/16/2011 8:45 AM Kristian Covey, MD [facility] [phone] [facility]     Future Orders Please Complete By  Expires   Diet Carb Modified      Activity as tolerated - No restrictions      Discharge wound care:      Comments:   Pack with iodoform dressing and then apply dry dressing.      DISCHARGE EXAM:  General:Alert, awake, oriented x3, in no acute distress. Patient's wife sitting at the bedside and states that she has no questions Vital Signs:Blood pressure 138/49, pulse 95, temperature 98.8 F (37.1 C), temperature source Oral, resp. rate 20, height 6' (1.829 m), weight 127.325 kg (280 lb 11.2 oz), SpO2 95.00%. HEENT: /AT PEERL, EOMI. Icterus resolved  Neck: Trachea midline, no masses, no thyromegal,y no JVD, no carotid bruit  OROPHARYNX: Moist, No exudate/ erythema/lesions.  Heart: Regular rate and rhythm, without murmurs, rubs, gallops, PMI non-displaced, no heaves or thrills on palpation.  Lungs: Clear to auscultation, no wheezing or rhonchi noted. No increased vocal fremitus resonant to percussion  Abdomen: Soft, mild epigastric and right upper quadrant tenderness, nondistended, positive bowel sounds, no masses no hepatosplenomegaly noted. No posterior chest wall pain Neuro: No focal neurological deficits noted cranial nerves II through XII grossly intact. DTRs 2+ bilaterally upper and lower extremities. Strength normal in bilateral upper and lower extremities. Patient observed ambulating in the hall without assistance and without difficulty. Musculoskeletal: No warm swelling or erythema around joints, no spinal tenderness noted.     Basename 09/21/11 0755 09/20/11 0505  NA 132* 134*  K 4.4 4.3  CL 99 102  CO2 25 24  GLUCOSE 253* 198*  BUN 16 18  CREATININE 1.61* 1.66*  CALCIUM 8.2* 8.7  MG -- --  PHOS -- --    Basename 09/21/11 0755 09/20/11 0505  AST 23 25  ALT 32 43  ALKPHOS 176* 187*  BILITOT 2.0* 2.3*  PROT 6.1 6.0  ALBUMIN 2.4* 2.3*   No results found for this basename: LIPASE:2,AMYLASE:2 in the last 72 hours  Basename 09/21/11 0755 09/20/11 0505  WBC 9.1 6.7   NEUTROABS  6.7 4.1  HGB 9.5* 9.3*  HCT 31.1* 29.7*  MCV 78.3 76.5*  PLT 302 294   Total time for discharge process including face-to-face time approximately 45 minutes Signed: Wreatha Sturgeon A. 09/21/2011, 1:07 PM

## 2011-09-22 ENCOUNTER — Other Ambulatory Visit: Payer: Self-pay | Admitting: *Deleted

## 2011-09-22 ENCOUNTER — Telehealth (INDEPENDENT_AMBULATORY_CARE_PROVIDER_SITE_OTHER): Payer: Self-pay

## 2011-09-22 LAB — GLUCOSE, CAPILLARY: Glucose-Capillary: 346 mg/dL — ABNORMAL HIGH (ref 70–99)

## 2011-09-22 MED ORDER — ALPRAZOLAM 0.5 MG PO TABS: 0.5000 mg | ORAL_TABLET | Freq: Three times a day (TID) | ORAL | Status: DC | PRN

## 2011-09-22 NOTE — Telephone Encounter (Signed)
Pt informed Rx called in. 

## 2011-09-22 NOTE — Telephone Encounter (Signed)
May refill alprazolam once only.

## 2011-09-22 NOTE — Telephone Encounter (Signed)
Pt calling in at 4:55pm requesting anxiety medication b/c of pt getting out of the hospital from being in for 1wk with gallbladder problems. The pt is having anxiety about the surgery is going to face in 4wks. I advised the pt that we would not prescribe anxiety medication we leave that for the PCP to prescribe. The pt asked what is he supposed to do with his anxiety issues and I told him to call his PCP. The pt said his PCP doesn't know what's going on with him and I advised pt he needed to call them to fill them in tomorrow. The pt see's DR Evelena Peat. The pt still asked for me to notify Dr Luisa Hart.

## 2011-09-22 NOTE — Telephone Encounter (Signed)
Pt is about to "come out of my skin" with anxiety about his medical condition, needs to have a surgery and they have explained it to him and his wife as "extreemly critical surgery".  Pt came home from the hospital yesterday and does not want to go back with anxiety in the middle of the night. Pt has Alprazolam on his med list, however he does not have any, forgot he ever had ever used it.  This was filled on 08-10-10, #30 with 0 refills. Requesting refill

## 2011-09-23 NOTE — Telephone Encounter (Signed)
Needs to call MD.  Its in epic.  We can fax report if not.  Not qualified to prescribe anxiolytics.

## 2011-09-24 ENCOUNTER — Ambulatory Visit (INDEPENDENT_AMBULATORY_CARE_PROVIDER_SITE_OTHER): Payer: Medicare Other | Admitting: Family Medicine

## 2011-09-24 ENCOUNTER — Encounter: Payer: Self-pay | Admitting: Family Medicine

## 2011-09-24 ENCOUNTER — Other Ambulatory Visit (INDEPENDENT_AMBULATORY_CARE_PROVIDER_SITE_OTHER): Payer: Self-pay

## 2011-09-24 VITALS — BP 130/62 | Temp 97.8°F | Wt 271.0 lb

## 2011-09-24 DIAGNOSIS — K8309 Other cholangitis: Secondary | ICD-10-CM

## 2011-09-24 DIAGNOSIS — N2889 Other specified disorders of kidney and ureter: Secondary | ICD-10-CM | POA: Insufficient documentation

## 2011-09-24 DIAGNOSIS — E119 Type 2 diabetes mellitus without complications: Secondary | ICD-10-CM

## 2011-09-24 DIAGNOSIS — K8020 Calculus of gallbladder without cholecystitis without obstruction: Secondary | ICD-10-CM

## 2011-09-24 DIAGNOSIS — N189 Chronic kidney disease, unspecified: Secondary | ICD-10-CM

## 2011-09-24 DIAGNOSIS — D649 Anemia, unspecified: Secondary | ICD-10-CM

## 2011-09-24 DIAGNOSIS — N289 Disorder of kidney and ureter, unspecified: Secondary | ICD-10-CM

## 2011-09-24 LAB — COMPREHENSIVE METABOLIC PANEL
AST: 29 U/L (ref 0–37)
Albumin: 3 g/dL — ABNORMAL LOW (ref 3.5–5.2)
Alkaline Phosphatase: 168 U/L — ABNORMAL HIGH (ref 39–117)
Potassium: 4.3 mEq/L (ref 3.5–5.1)
Sodium: 137 mEq/L (ref 135–145)
Total Bilirubin: 1.4 mg/dL — ABNORMAL HIGH (ref 0.3–1.2)
Total Protein: 7.1 g/dL (ref 6.0–8.3)

## 2011-09-24 LAB — CBC WITH DIFFERENTIAL/PLATELET
Eosinophils Absolute: 0.3 10*3/uL (ref 0.0–0.7)
Lymphs Abs: 1.7 10*3/uL (ref 0.7–4.0)
MCHC: 32 g/dL (ref 30.0–36.0)
MCV: 77.4 fl — ABNORMAL LOW (ref 78.0–100.0)
Monocytes Absolute: 0.4 10*3/uL (ref 0.1–1.0)
Neutrophils Relative %: 70.5 % (ref 43.0–77.0)
Platelets: 469 10*3/uL — ABNORMAL HIGH (ref 150.0–400.0)
RDW: 19 % — ABNORMAL HIGH (ref 11.5–14.6)

## 2011-09-24 NOTE — Progress Notes (Signed)
Subjective:    Patient ID: Frank Ashley, male    DOB: 1941/03/03, 71 y.o.   MRN: [id number]  HPI  Hospital followup. Patient had initially presented here with some epigastric pain but no right upper quadrant tenderness and some nausea-but no fever and nontoxic. Similar symptoms to previous episode of pancreatitis. Lab work including CBC, electrolytes, and amylase unremarkable. Patient had progression of symptoms over the next 24 hours and was admitted the next day with progressive abdominal pain, nausea and vomiting and was diagnosed with ascending cholangitis. Ultrasound revealed dilated common bile duct dilatation. Attempts were made for ERCP which failed secondary to Billroth II anatomy. Drain was applied. Decision was made to delay surgery. Patient had bacteremia with Escherichia coli and is finishing treatment with Keflex and Flagyl. He had no fever since discharge. He has followup with surgeon next week. Plan is to do open cholecystectomy and common bile duct exploration at about 6 weeks.  Patient had some brief diarrhea in hospital but has had none whatsoever since discharge. He has type 2 diabetes has recently had some elevated blood sugars fasting around 300 range. Takes 70/30 insulin and takes 80 units in the morning is 75 units at night. No hypoglycemic symptoms. He has chronic kidney disease with baseline creatinine around 1.6-1.7.  CT scan abdomen showed exophytic lesion right kidney about 5 cm diameter (discharge summary says left kidney mass but CT scan says right kidney). Suspicion for renal cell carcinoma. He has not had any hematuria. There was no urology consult or discussion of biopsy during hospitalization because of his degree of illness.  Past Medical History  Diagnosis Date  . Allergy   . Diabetes mellitus     Type 2  . GERD (gastroesophageal reflux disease)   . Hyperlipidemia   . Peripheral neuropathy     peripheral neuropathy  . Renal insufficiency   . Diabetic foot  ulcers   . Overweight   . Sleep apnea     CPAP  . Pancreatitis 02/2007    ACUTE  . MRSA (methicillin resistant Staphylococcus aureus)   . CAD (coronary artery disease) ~1997    S/p stenting but denies MI   Past Surgical History  Procedure Date  . Coronary angioplasty with stent placement   . Penile prosthesis implant     later removed  . Partial gastrectomy 1973  . Esophagogastroduodenoscopy 09/16/2011    Procedure: ESOPHAGOGASTRODUODENOSCOPY (EGD);  Surgeon: Barrie Folk, MD;  Location: [facility];  Service: Endoscopy;  Laterality: N/A;  . Cholecystectomy 09/17/2011    Procedure: CHOLECYSTECTOMY;  Surgeon: Adolph Pollack, MD;  Location: [facility];  Service: General;  Laterality: N/A;  WITH COMMON DUCT EXPLORATION      reports that he quit smoking about 51 years ago. His smoking use included Cigarettes. He has never used smokeless tobacco. He reports that he does not drink alcohol or use illicit drugs. family history includes Colon cancer (age of onset:61) in his sister; Colon cancer (age of onset:74) in his mother; and Colon cancer (age of onset:75) in his brother.  There is no history of Stomach cancer. Allergies  Allergen Reactions  . Hydrocodone     codone family drug  . Sulfamethoxazole     REACTION: hives      Review of Systems  Constitutional: Negative for fever and chills.  HENT: Negative for sore throat.   Respiratory: Negative for cough and shortness of breath.   Cardiovascular: Negative for chest pain and palpitations.  Gastrointestinal: Negative for  nausea, vomiting and abdominal distention.  Genitourinary: Negative for dysuria and hematuria.  Neurological: Negative for dizziness, syncope and headaches.       Objective:   Physical Exam  Constitutional: He is oriented to person, place, and time. He appears well-developed and well-nourished.  HENT:  Mouth/Throat: Oropharynx is clear and moist.  Neck: Neck supple.  Cardiovascular: Normal rate and regular  rhythm.   Pulmonary/Chest: Effort normal and breath sounds normal. No respiratory distress. He has no wheezes. He has no rales.  Abdominal: Soft. Bowel sounds are normal. He exhibits no distension and no mass. There is no tenderness. There is no rebound and no guarding.       Biliary drain in place. Draining clear bile fluid  Musculoskeletal: He exhibits no edema.  Neurological: He is alert and oriented to person, place, and time.          Assessment & Plan:  #1 recent ascending cholangitis. Patient has biliary drain in place. Plan for open cholecystectomy and duct exploration in 4-6 weeks. #2 liver transaminase elevation. Repeat CMET and CBC #3 type 2 diabetes. Recent worsening control. Continue close monitoring Will adjust insulin accordingly  #4 recent diarrhea transient during hospitalization. No C. difficile testing done. Resolved at this time  #5 right renal mass on recent CT abdomen. Urology referral. Patient's family questioning whether renal biopsy can be done in conjunction with his gallbladder surgery #6 recent anemia. Recheck CBC  #7 recent bacteremia with Escherichia coli. Patient afebrile.  Finish out antibiotics. #8 chronic kidney disease

## 2011-09-27 NOTE — ED Provider Notes (Signed)
Medical screening examination/treatment/procedure(s) were conducted as a shared visit with non-physician practitioner(s) and myself.  I personally evaluated the patient during the encounter  Toy Baker, MD 09/27/11 [phone]

## 2011-09-28 ENCOUNTER — Telehealth: Payer: Self-pay | Admitting: Family Medicine

## 2011-09-28 ENCOUNTER — Ambulatory Visit (INDEPENDENT_AMBULATORY_CARE_PROVIDER_SITE_OTHER): Payer: Medicare Other | Admitting: Surgery

## 2011-09-28 ENCOUNTER — Encounter (INDEPENDENT_AMBULATORY_CARE_PROVIDER_SITE_OTHER): Payer: Self-pay | Admitting: Surgery

## 2011-09-28 DIAGNOSIS — K811 Chronic cholecystitis: Secondary | ICD-10-CM | POA: Insufficient documentation

## 2011-09-28 DIAGNOSIS — K819 Cholecystitis, unspecified: Secondary | ICD-10-CM | POA: Insufficient documentation

## 2011-09-28 DIAGNOSIS — K8050 Calculus of bile duct without cholangitis or cholecystitis without obstruction: Secondary | ICD-10-CM

## 2011-09-28 NOTE — Patient Instructions (Signed)
Cholecystitis   Cholecystitis is swelling and irritation (inflammation) of your gallbladder. This often happens when gallstones or sludge build up in the gallbladder. Treatment is needed right away.  HOME CARE  Home care depends on how you were treated. In general:   If you were given antibiotic medicine, take it as told. Finish the medicine even if you start to feel better.   Only take medicines as told by your doctor.   Eat low-fat foods until your next doctor visit.   Keep all doctor visits as told.  GET HELP RIGHT AWAY IF:   You have more pain and medicine does not help.   Your pain moves to a different part of your belly (abdomen) or to your back.   You have a fever.   You feel sick to your stomach (nauseous).   You throw up (vomit).  MAKE SURE YOU:   Understand these instructions.   Will watch your condition.   Will get help right away if you are not doing well or get worse.  Document Released: 04/08/2011 Document Reviewed: 04/06/2011  [employer] Patient Information 2012 [employer].

## 2011-09-28 NOTE — Telephone Encounter (Signed)
I have already made referral.

## 2011-09-28 NOTE — Telephone Encounter (Signed)
Pt states he saw surgeon this morning and he agreed with Dr. Caryl Never that pt should see a nephrologist.  Pt requesting to have referral set up so he can been seen before his surgery.  Please return call to discuss.

## 2011-09-28 NOTE — Progress Notes (Signed)
Subjective:     Patient ID: Frank Ashley, male   DOB: April 16, 1941, 71 y.o.   MRN: [id number]  HPI Patient returns in followup after cholecystostomy tube placement for chronic cholecystitis and choledocholithiasis. He has had a Billroth II reconstruction it was a patient at [facility]  2 weeks ago. He will need open cholecystectomy and common duct exploration. ERCP was attempted but unsuccessful. His liver function studies have returned to normal and his common bile duct seems well-drained from his percutaneous tube. Denies any pain or fever.  Review of Systems  Constitutional: Negative.   HENT: Negative.   Eyes: Negative.   Cardiovascular: Negative.   Gastrointestinal: Negative.        Objective:   Physical Exam  Constitutional: He appears well-developed and well-nourished.  HENT:  Head: Normocephalic and atraumatic.  Neck: Normal range of motion. Neck supple.  Abdominal:     Hepatic Function Panel     Component Value Date/Time   PROT 7.1 09/24/2011 1407   ALBUMIN 3.0* 09/24/2011 1407   AST 29 09/24/2011 1407   ALT 26 09/24/2011 1407   ALKPHOS 168* 09/24/2011 1407   BILITOT 1.4* 09/24/2011 1407   BILIDIR 4.2* 09/17/2011 0645   IBILI 1.2* 09/17/2011 0645        Assessment:     Chronic cholecystitis and choledocolithiasis Renal mass    Plan:     Needs urology consultation for renal mass Open cholecystectomy and common bile duct exploration in 4 -6 weeks. RTC 2 weeks.

## 2011-09-28 NOTE — Telephone Encounter (Signed)
Pt said Dr Luisa Hart will do gallbladder surgery and maybe nephrologist could do surgery?mass on kidney at same time.

## 2011-09-29 ENCOUNTER — Telehealth: Payer: Self-pay | Admitting: Family Medicine

## 2011-09-29 MED ORDER — GLUCOSE BLOOD VI STRP: ORAL_STRIP | Status: DC

## 2011-09-29 NOTE — Telephone Encounter (Signed)
Pt called back again and said that he also needs to get a script for True Track test strips for meter. Pls call in to [facility] in [place] [phone] in addition to getting refill of Hydromorphone.

## 2011-09-29 NOTE — Telephone Encounter (Signed)
Patient called stating that he need a refill of his hydromorphone originally prescribed by the hospital. Please assist.

## 2011-09-29 NOTE — Telephone Encounter (Signed)
May refill once for #20 tablets only.

## 2011-09-29 NOTE — Telephone Encounter (Signed)
Rx for test strips sent. Please advise on hydromorphone Rf. Thanks

## 2011-09-30 ENCOUNTER — Telehealth: Payer: Self-pay | Admitting: Family Medicine

## 2011-09-30 MED ORDER — HYDROMORPHONE HCL 4 MG PO TABS: 4.0000 mg | ORAL_TABLET | ORAL | Status: DC | PRN

## 2011-09-30 MED ORDER — GLUCOSE BLOOD VI STRP: ORAL_STRIP | Status: DC

## 2011-09-30 MED ORDER — SODIUM CHLORIDE 0.9 % IR SOLN: 1000.0000 mL | Status: DC | PRN

## 2011-09-30 NOTE — Telephone Encounter (Signed)
Rx printed for provider signature, test strips called in yesterday.

## 2011-09-30 NOTE — Telephone Encounter (Signed)
Okay to call into pharmacy

## 2011-09-30 NOTE — Telephone Encounter (Signed)
Pt needs 0.9 % sodium chloride 10 ml bottle. Pt needs rx call into [facility] [phone]. Pt needs solution to flush out drainage tubes on his side.

## 2011-09-30 NOTE — Telephone Encounter (Signed)
Refill called to [facility] at [facility], pt notified.

## 2011-09-30 NOTE — Telephone Encounter (Signed)
Please advise 

## 2011-09-30 NOTE — Telephone Encounter (Signed)
Rx signed and given to front office for pick up. Pt notified and requests that we re-send rx with specific directions 3 x daily testing for test strips. Rx re-sent.

## 2011-09-30 NOTE — Telephone Encounter (Signed)
Addended by: Mervin Kung A on: 09/30/2011 10:56 AM   Modules accepted: Orders

## 2011-09-30 NOTE — Telephone Encounter (Signed)
Pt test his blood sugar 3 times a day. Please call in correct amount of test strips

## 2011-09-30 NOTE — Telephone Encounter (Signed)
Addended by: Mervin Kung A on: 09/30/2011 11:25 AM   Modules accepted: Orders

## 2011-10-08 ENCOUNTER — Other Ambulatory Visit: Payer: Self-pay | Admitting: Family Medicine

## 2011-10-11 ENCOUNTER — Encounter (INDEPENDENT_AMBULATORY_CARE_PROVIDER_SITE_OTHER): Payer: Self-pay | Admitting: Surgery

## 2011-10-11 ENCOUNTER — Ambulatory Visit (INDEPENDENT_AMBULATORY_CARE_PROVIDER_SITE_OTHER): Payer: Medicare Other | Admitting: Surgery

## 2011-10-11 VITALS — BP 128/72 | HR 76 | Temp 98.2°F | Resp 16 | Ht 73.0 in | Wt 276.6 lb

## 2011-10-11 DIAGNOSIS — K8050 Calculus of bile duct without cholangitis or cholecystitis without obstruction: Secondary | ICD-10-CM

## 2011-10-11 MED ORDER — ESOMEPRAZOLE MAGNESIUM 40 MG PO CPDR: 40.0000 mg | DELAYED_RELEASE_CAPSULE | Freq: Every day | ORAL | Status: DC

## 2011-10-11 MED ORDER — HYDROMORPHONE HCL 4 MG PO TABS: 4.0000 mg | ORAL_TABLET | ORAL | Status: AC | PRN

## 2011-10-11 MED ORDER — ALPRAZOLAM 0.5 MG PO TABS: 0.5000 mg | ORAL_TABLET | Freq: Three times a day (TID) | ORAL | Status: DC | PRN

## 2011-10-11 NOTE — Telephone Encounter (Signed)
Alprazolam last filled 09/13/11, #30 with 0 refills

## 2011-10-11 NOTE — Progress Notes (Signed)
Patient ID: Frank Ashley, male   DOB: Jan 12, 1941, 71 y.o.   MRN: [id number]  Chief Complaint  Patient presents with  . Routine Post Op    PO GB; reck drain    HPI Frank Ashley is a 71 y.o. male.   HPIPatient returns in followup of his common bile duct stones and gallstones. His percutaneous cholecystostomy tube is functioning well. He does have occasional panty drain site in his back but is eating okay.  Past Medical History  Diagnosis Date  . Allergy   . Diabetes mellitus     Type 2  . GERD (gastroesophageal reflux disease)   . Hyperlipidemia   . Peripheral neuropathy     peripheral neuropathy  . Renal insufficiency   . Diabetic foot ulcers   . Overweight   . Sleep apnea     CPAP  . Pancreatitis 02/2007    ACUTE  . MRSA (methicillin resistant Staphylococcus aureus)   . CAD (coronary artery disease) ~1997    S/p stenting but denies MI    Past Surgical History  Procedure Date  . Coronary angioplasty with stent placement   . Penile prosthesis implant     later removed  . Partial gastrectomy 1973  . Esophagogastroduodenoscopy 09/16/2011    Procedure: ESOPHAGOGASTRODUODENOSCOPY (EGD);  Surgeon: Barrie Folk, MD;  Location: [facility];  Service: Endoscopy;  Laterality: N/A;  . Cholecystectomy 09/17/2011    Procedure: CHOLECYSTECTOMY;  Surgeon: Adolph Pollack, MD;  Location: [facility];  Service: General;  Laterality: N/A;  WITH COMMON DUCT EXPLORATION      Family History  Problem Relation Age of Onset  . Colon cancer Mother 63  . Colon cancer Sister 5  . Colon cancer Brother 28  . Stomach cancer Neg Hx     Social History History  Substance Use Topics  . Smoking status: Former Smoker    Types: Cigarettes    Quit date: 02/25/1960  . Smokeless tobacco: Never Used  . Alcohol Use: No    Allergies  Allergen Reactions  . Hydrocodone     codone family drug  . Sulfamethoxazole     REACTION: hives    Current Outpatient Prescriptions  Medication Sig  Dispense Refill  . acetaminophen (TYLENOL) 325 MG tablet Take 2 tablets (650 mg total) by mouth every 6 (six) hours as needed (or Fever >/= 101).      [name] ALPRAZolam (XANAX) 0.5 MG tablet Take 1 tablet (0.5 mg total) by mouth 3 (three) times daily as needed. For anxiety.  30 tablet  0  . aspirin 325 MG EC tablet Take 325 mg by mouth 2 (two) times daily.        [name] esomeprazole (NEXIUM) 40 MG capsule Take 1 capsule (40 mg total) by mouth daily before breakfast.  30 capsule  0  . fenofibrate 160 MG tablet TAKE ONE TABLET BY MOUTH EVERY DAY  90 tablet  3  . furosemide (LASIX) 40 MG tablet Take 40 mg by mouth daily as needed. For fluid retention.      . gabapentin (NEURONTIN) 800 MG tablet TAKE 1 TABLET BY MOUTH 5 TIMES DAILY  450 tablet  3  . glucose blood test strip True Track strips. Use to check blood sugar three times daily.  100 each  3  . INS SYRINGE/NEEDLE 1CC/29G (RELION INSULIN SYRINGE) 29G X 1/2" 1 ML MISC by Does not apply route. Use as directed two times a day       . insulin  NPH-insulin regular (NOVOLIN 70/30) (70-30) 100 UNIT/ML injection Inject 80  units subcutaneously in the morning and 75 units in the evening as directed  10 mL  6  . Insulin Pen Needle (1ST CHOICE PEN NEEDLES) 31G X 8 MM MISC by Does not apply route.        [name] lisinopril (PRINIVIL,ZESTRIL) 10 MG tablet TAKE ONE TABLET BY MOUTH EVERY DAY  90 tablet  0  . metoclopramide (REGLAN) 10 MG tablet Take 10 mg by mouth 4 (four) times daily. One by mouth qid- AC meals and q hs as needed for nausea and vomiting       . oxiconazole (OXISTAT) 1 % CREA Apply 1 application topically as needed. For jock itch.      . senna (SENOKOT) 8.6 MG TABS Take 1 tablet (8.6 mg total) by mouth 2 (two) times daily.  60 tablet  0  . simvastatin (ZOCOR) 40 MG tablet TAKE ONE TABLET BY MOUTH EVERY DAY  90 tablet  3  . sodium chloride irrigation 0.9 % irrigation Irrigate with 1,000 mLs as directed as needed.  1000 mL  1  . triamcinolone cream (KENALOG) 0.1  % Apply 1 application topically 2 (two) times daily as needed. For rash on back.      . DISCONTD: esomeprazole (NEXIUM) 40 MG capsule Take 40 mg by mouth daily before breakfast.      . HYDROmorphone (DILAUDID) 4 MG tablet Take 1 tablet (4 mg total) by mouth every 4 (four) hours as needed.  20 tablet  0   Current Facility-Administered Medications  Medication Dose Route Frequency Provider Last Rate Last Dose  . promethazine (PHENERGAN) injection 25 mg  25 mg Intramuscular Once Kristian Covey, MD      . TDaP (BOOSTRIX) injection 0.5 mL  0.5 mL Intramuscular Once Kristian Covey, MD        Review of Systems Review of Systems  Constitutional: Negative for fever, chills and unexpected weight change.  HENT: Negative for hearing loss, congestion, sore throat, trouble swallowing and voice change.   Eyes: Negative for visual disturbance.  Respiratory: Negative for cough and wheezing.   Cardiovascular: Negative for chest pain, palpitations and leg swelling.  Gastrointestinal: Negative for nausea, vomiting, abdominal pain, diarrhea, constipation, blood in stool, abdominal distention, anal bleeding and rectal pain.  Genitourinary: Negative for hematuria and difficulty urinating.  Musculoskeletal: Negative for arthralgias.  Skin: Negative for rash and wound.  Neurological: Negative for seizures, syncope, weakness and headaches.  Hematological: Negative for adenopathy. Does not bruise/bleed easily.  Psychiatric/Behavioral: Negative for confusion.    Blood pressure 128/72, pulse 76, temperature 98.2 F (36.8 C), temperature source Temporal, resp. rate 16, height 6\' 1"  (1.854 m), weight 276 lb 9.6 oz (125.465 kg).  Physical Exam Physical Exam  Constitutional: He appears well-developed and well-nourished.  HENT:  Head: Normocephalic and atraumatic.  Eyes: EOM are normal. Pupils are equal, round, and reactive to light.  Neck: Normal range of motion. Neck supple.  Cardiovascular: Normal rate and  regular rhythm.   Pulmonary/Chest: Effort normal and breath sounds normal.  Abdominal: Soft. Normal appearance.        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 He also is in need of urological assessment of right kidney mass and determination if this will  be tackled at the same time as his cholecystectomy and common duct expiration. This may increase potential operative risk if he requires a right nephrectomy. Once urology sees patient, we can schedule his surgery. Risks of bleeding, infection, the use of T-tube drainage, organ injury, blood clots, death, exacerbation and one medical problems, organ injury, and the need further surgery discussed with the patient.       Daiki Dicostanzo A. 10/11/2011, 12:49 PM

## 2011-10-11 NOTE — Patient Instructions (Signed)
Call us after seen by urologist to schedule surgery.      Common Bile Duct Stones The gallbladder is located in the right side of the upper belly (abdomen) under the liver. It stores bile from the liver until it is needed. Bile is made up of cholesterol, water, salts, fats, proteins, and waste product (bilirubin). The common bile duct is the tube that carries bile from other ducts to the small intestine to help digest fats. Bile duct stones are stone-like pieces of material that form in the gallbladder and become lodged in the common bile duct. Stones may be present in the gallbladder as well as the bile duct. They can vary in size and number. Having a stone in the common bile duct is a serious problem. The stone may pass on its own. However, if it gets stuck and causes a blockage in the common bile duct, yellow color in the skin or eyes (jaundice) develops. If the blockage persists, the bile becomes infected (cholangitis). This is a life-threatening problem. CAUSES  Excess cholesterol in the bile is the most common reason bile hardens into a stone-like material. Excess bilirubin or not enough bile salts may also cause the bile to harden. Why this happens is not entirely clear. Certain factors increase the risk of developing stones, such as:  Being male. Women are twice as likely to develop gallstones, especially those who are pregnant, taking hormone replacement therapy, or taking birth control pills.   Having a family history of stones.   Being overweight (even moderately overweight).   Consuming a high-fat, high-cholesterol diet.   Crash dieting or rapid weight loss.   Being older than 60.   Having [employer] [place] [employer] [place] [employer].   Taking cholesterol-lowering medicine.   Having diabetes.  SYMPTOMS  Small bile duct stones may not cause any symptoms. They are sometimes discovered while having tests for other conditions. Larger stones can block the flow of bile into the  small intestine. This can cause infection and inflammation in the gallbladder, ducts, or sometimes, the liver or pancreas. Symptoms (sometimes called a "gallbladder attack") may include:  Pain in the upper right abdomen (biliary colic). The pain can last from 30 minutes to several hours.   Pain between the shoulder blades at the back.   Pain under the right shoulder.   Nausea.   Vomiting.   Fever.   Jaundice.  DIAGNOSIS  Symptoms can mimic other conditions, so proper diagnosis is important. An imaging test is often performed to confirm the diagnosis and determine the location and size of the stones, such as:  Ultrasound.   CT scan.   Cholescintigraphy, or HIDA (hepatobiliary iminodiacetic acid) scan. This is an imaging technique using a safe, radioactive dye.   ERCP (endoscopic retrograde cholangiopancreatography). This is an imaging technique that can detect and remove stones.  Blood tests may also be performed. TREATMENT  Watchful waiting may be all that is necessary if you do not have symptoms. If you are experiencing pain or other symptoms:  Pain medicine and antibiotics may be given.   ERCP may be performed to locate and remove the stones to relieve the obstruction. In this procedure, a tube is threaded through the mouth, down into the small intestine. After having an ERCP, the gallbladder will often be removed (cholecystectomy) during the same hospitalization. Cholecystectomy is typically performed with a flexible telescope-like instrument (laparoscope) through tiny incisions.   Rarely, some cases may require open surgery, with a larger incision. Open surgery  is needed when ERCP is unsuccessful to remove the stone or if laparoscopic surgery is unsuccessful to remove the gallbladder.   Insertion of a bile drainage tube (cholecystostomy) may be performed to take care of common bile duct stones. In this procedure, a tube is inserted into the gallbladder or the bile ducts in the  liver to relieve the pressure caused by the stone. After the patient gets better, the gallbladder will be removed and the stone dealt with.  PREVENTION   Limit your intake of fats and cholesterol.   Avoid "crash" diets or rapid weight loss.   Maintain a healthy weight.  HOME CARE INSTRUCTIONS   Take pain relievers as directed.   Limit your intake of fats and cholesterol if you experience pain or bloating. Try eating smaller meals.   Follow up with your caregiver for additional exams and testing as directed.   If you have had a procedure or surgery, follow your caregiver's specific instructions for care after surgery.  SEEK MEDICAL CARE IF:  Attacks become more frequent and impact your daily activities. SEEK IMMEDIATE MEDICAL CARE IF:   Your pain does not go away or becomes severe (lasts up to 5 hours).   You have a fever.   You feel nauseous.   You are vomiting.   You appear to have jaundice.   Your stools are light reddish-brown in color.  MAKE SURE YOU:   Understand these instructions.   Will watch your condition.   Will get help right away if you are not doing well or get worse.  Document Released: 07/14/2009 Document Revised: 04/08/2011 Document Reviewed: 07/14/2009 [facility] Patient Information 2012 [place], [place].

## 2011-10-11 NOTE — Telephone Encounter (Signed)
Refill gabapentin and prilosec for 6 months.

## 2011-10-12 ENCOUNTER — Other Ambulatory Visit: Payer: Self-pay | Admitting: *Deleted

## 2011-10-12 MED ORDER — ONDANSETRON 8 MG PO TBDP: 8.0000 mg | ORAL_TABLET | Freq: Three times a day (TID) | ORAL | Status: AC | PRN

## 2011-10-14 ENCOUNTER — Other Ambulatory Visit (HOSPITAL_COMMUNITY): Payer: Self-pay | Admitting: Urology

## 2011-10-14 DIAGNOSIS — N2889 Other specified disorders of kidney and ureter: Secondary | ICD-10-CM

## 2011-10-15 ENCOUNTER — Other Ambulatory Visit (HOSPITAL_COMMUNITY): Payer: Medicare Other

## 2011-10-19 ENCOUNTER — Telehealth (INDEPENDENT_AMBULATORY_CARE_PROVIDER_SITE_OTHER): Payer: Self-pay

## 2011-10-19 NOTE — Telephone Encounter (Signed)
Patient seen by Urology, Dr. Mena Goes scheduled an MRI for Thursday 10/21/11 for further evaluation of renal mass. Patient would like to schedule surgery as soon as , patient aware that his case will be coordinated with Urology and we will call to schedule patient once Dr. Luisa Hart has written order's.

## 2011-10-21 ENCOUNTER — Ambulatory Visit
Admission: RE | Admit: 2011-10-21 | Discharge: 2011-10-21 | Disposition: A | Discharge status: Home / Self Care | Payer: Medicare Other | Source: Ambulatory Visit | Attending: Urology | Admitting: Urology

## 2011-10-21 ENCOUNTER — Encounter (INDEPENDENT_AMBULATORY_CARE_PROVIDER_SITE_OTHER): Payer: Self-pay | Admitting: General Surgery

## 2011-10-21 ENCOUNTER — Ambulatory Visit (INDEPENDENT_AMBULATORY_CARE_PROVIDER_SITE_OTHER): Payer: Medicare Other | Admitting: General Surgery

## 2011-10-21 VITALS — BP 132/84 | HR 84 | Temp 98.1°F | Resp 14 | Ht 73.0 in | Wt 286.6 lb

## 2011-10-21 DIAGNOSIS — K8050 Calculus of bile duct without cholangitis or cholecystitis without obstruction: Secondary | ICD-10-CM

## 2011-10-21 DIAGNOSIS — N2889 Other specified disorders of kidney and ureter: Secondary | ICD-10-CM

## 2011-10-21 MED ORDER — TRAMADOL HCL 50 MG PO TABS: 50.0000 mg | ORAL_TABLET | Freq: Four times a day (QID) | ORAL | Status: DC | PRN

## 2011-10-21 MED ORDER — GADOBENATE DIMEGLUMINE 529 MG/ML IV SOLN
10.0000 mL | Freq: Once | INTRAVENOUS | Status: AC | PRN
  Administered 2011-10-21: 10 mL via INTRAVENOUS

## 2011-10-21 NOTE — Patient Instructions (Signed)
Go to [facility] radiology department tomorrow and have them put on another plastic piece to hold your cholecystostomy tube in place.

## 2011-10-21 NOTE — Progress Notes (Signed)
Subjective:     Patient ID: Frank Ashley, male   DOB: 05-28-1940, 71 y.o.   MRN: [id number]  HPI He presents to Urgent Office c/o sharp pulling pain around cholecystostomy tube site.  He had an MRI of a right renal mass and is interested in the results of that as well.    Review of Systems  Constitutional: Negative for fever and chills.       Objective:   Physical Exam  Constitutional:       Overweight male in NAD.  Abdominal: Soft. He exhibits no mass. There is tenderness (around RUQ drain site). There is no guarding.       Plastic drain holder has come loose and flipped.  Drain suture is intact.  Genitourinary:       4 cm exophytic right renal mass on MRI.       Assessment:     Choledocholithiasis and cholelithiasis with percutaneous cholecystostomy tube holder that has come loose and is causing pain by pulling on his skin.  He is due to have open cholecystectomy and CBDE by Dr. Luisa Hart in coordination with removal of the right renal tumor in the future.    Plan:     He was instructed to go to [facility] Radiology Department tomorrow and have them place another plastic tube holder on the drain.  Dr. Luisa Hart will coordinate his surgery with the Urologist.

## 2011-10-22 ENCOUNTER — Other Ambulatory Visit: Payer: Self-pay | Admitting: Urology

## 2011-10-22 ENCOUNTER — Encounter (HOSPITAL_BASED_OUTPATIENT_CLINIC_OR_DEPARTMENT_OTHER): Payer: Self-pay | Admitting: *Deleted

## 2011-10-22 ENCOUNTER — Other Ambulatory Visit: Payer: Self-pay | Admitting: *Deleted

## 2011-10-22 MED ORDER — SODIUM CHLORIDE 0.9 % IR SOLN: 1000.0000 mL | Status: DC | PRN

## 2011-10-22 NOTE — Progress Notes (Addendum)
NPO AFTER MN. ARRIVES AT 1045. NEEDS ISTAT. CURRENT EKG IN EPIC AND CHART. WILL TAKE GABAPENTIN AND PRILOSEC AM OF SURG W/ SIP OF WATER.  THIS PT CURRENTLY HAS PERC. CHOLECYSTOTOMY TUBE W/ DRAINAGE BAG.  PT IS TO HAVE OPEN CHOLE. W/ RENAL MASS REMOVED COMBINATION SURG. DR CORNETTE HAS ALREADY PUT ORDERS FOR THE CHOLE. , PLEASE BE CAREFUL AND NOT RELEASE ORDERS. DATE OF THIS SURG. HAS BEEN MADE UNTIL AFTER 10-26-2011 PROCEDURE WITH [place].

## 2011-10-23 NOTE — Telephone Encounter (Signed)
Urology still working patient up.  Will schedule once Urology clear on what needs to be done.

## 2011-10-25 ENCOUNTER — Telehealth: Payer: Self-pay | Admitting: Family Medicine

## 2011-10-25 MED ORDER — LISINOPRIL 10 MG PO TABS: 10.0000 mg | ORAL_TABLET | Freq: Every day | ORAL | Status: DC

## 2011-10-25 MED ORDER — ALPRAZOLAM 0.5 MG PO TABS: 0.5000 mg | ORAL_TABLET | Freq: Three times a day (TID) | ORAL | Status: DC | PRN

## 2011-10-25 MED ORDER — SIMVASTATIN 40 MG PO TABS: 40.0000 mg | ORAL_TABLET | Freq: Every day | ORAL | Status: DC

## 2011-10-25 NOTE — Telephone Encounter (Signed)
Pt called req to get refills for simvastatin (ZOCOR) 40 MG tablet,  lisinopril (PRINIVIL,ZESTRIL) 10 MG tablet along with Alprazolam 0.5 mg to [facility].

## 2011-10-25 NOTE — Telephone Encounter (Signed)
Pt call back stating he had a missed call from [place] and felt it was [place]. Please contact with refills have been called in

## 2011-10-25 NOTE — Progress Notes (Signed)
Chart reviewed by Dr. Okey Dupre.  Ok to proceed w surgery.

## 2011-10-25 NOTE — Telephone Encounter (Signed)
Alprazolam last filled #30 with 0 refills on 10/11/11, sig: one tab tid prn.  LMTCB on home number to call and ask how is Frank Ashley taking the Alprazolam? Simvastatin is historical med only?  We have not filled in Epic? Lisinopril ready for refill, pt changing pharmacies   I spoke with pt, Frank Ashley admits to taking the Alprazolam 3 times a day the past 2-3 weeks upset about the MRI, cancer on his kidney and maybe other organs.  Frank Ashley has the surgery tomorrow.

## 2011-10-25 NOTE — Telephone Encounter (Signed)
Pt informed Rx sent and called in

## 2011-10-25 NOTE — Telephone Encounter (Signed)
Refill simvastatin and lisinopril for one year. One month refill of alprazolam

## 2011-10-25 NOTE — Telephone Encounter (Signed)
Pt requesting 1 month refill on ALPRAZolam Prudy Feeler) 0.5 MG tablet    [facility]

## 2011-10-25 NOTE — H&P (Signed)
HPI: Incidental RLP renal mass. Pt presents for left RGP, poss left URS. He has CRI. CT showed RLP mass with effacement of renal sinus and incomplete contrast.   Past Medical History Problems  1. History of  Adult Sleep Apnea 780.57 2. History of  Anxiety (Symptom) 300.00 3. History of  Diabetes Mellitus 250.00 4. History of  Esophageal Reflux 530.81 5. History of  Gastric Ulcer 531.90 6. History of  Gout 274.9 7. History of  Hypercholesterolemia 272.0 8. History of  Nephrolithiasis V13.01  Surgical History Problems  1. History of  Gastric Surgery  Current Meds 1. ALPRAZolam 0.5 MG Oral Tablet; Therapy: (Recorded:12Jun2013) to 2. Aspirin 500 MG TABS; Therapy: (Recorded:12Jun2013) to 3. Furosemide 40 MG Oral Tablet; Therapy: (Recorded:12Jun2013) to 4. HYDROmorphone HCl 4 MG Oral Tablet; Therapy: (Recorded:12Jun2013) to 5. Lisinopril 10 MG Oral Tablet; Therapy: (Recorded:12Jun2013) to 6. NexIUM 40 MG Oral Capsule Delayed Release; Therapy: (Recorded:12Jun2013) to 7. NovoLIN R SOLN; Therapy: (Recorded:12Jun2013) to  Allergies Medication  1. Hydrocodone-Acetaminophen CAPS 2. Sulfamethoxazole-TMP DS TABS  Family History Problems  1. Sororal history of  Blood In Urine 2. Sororal history of  Colon Cancer V16.0 3. Fraternal history of  Colon Cancer V16.0 4. Family history of  Death In The Family Father 5. Family history of  Death In The Family Mother 6. Family history of  Family Health Status Number Of Children 3 sons 1 daughter 65. Paternal history of  Gastric Cancer V16.0 8. Maternal history of  Liver Cancer 9. Fraternal history of  Nephrolithiasis  Social History Problems    Caffeine Use 1 per day   Former Smoker V15.82 smoked for 58yrs quit 15yrs ago   Marital History - Currently Married   Retired From Work Denied    History of  Alcohol Use  Review of Systems Genitourinary, constitutional, skin, eye, otolaryngeal, hematologic/lymphatic, cardiovascular,  pulmonary, endocrine, musculoskeletal, gastrointestinal, neurological and psychiatric system(s) were reviewed and pertinent findings if present are noted.  Gastrointestinal: nausea, vomiting, heartburn and diarrhea.  Constitutional: fever, night sweats and feeling tired (fatigue).  Integumentary: skin rash/lesion and pruritus.  Respiratory: shortness of breath.  Neurological: headache and dizziness.    Vitals Vital Signs [Data Includes: Last 1 Day]  12Jun2013 10:25AM  BMI Calculated: 36.71 BSA Calculated: 2.47 Height: 6 ft 1 in Weight: 277 lb  Blood Pressure: 116 / 65 Temperature: 97.6 F Heart Rate: 84  Physical Exam Constitutional: Well nourished and well developed . No acute distress.  ENT:. The ears and nose are normal in appearance.  Neck: The appearance of the neck is normal and no neck mass is present.  Pulmonary: No respiratory distress and normal respiratory rhythm and effort.  Cardiovascular: Heart rate and rhythm are normal . No peripheral edema.  Abdomen: The abdomen is soft and nontender. No masses are palpated. No CVA tenderness. No hernias are palpable. No hepatosplenomegaly noted.  Rectal: Rectal exam demonstrates normal sphincter tone, no tenderness and no masses. Estimated prostate size is 1+. The prostate has no nodularity and is not tender. The left seminal vesicle is nonpalpable. The right seminal vesicle is nonpalpable. The perineum is normal on inspection.  Genitourinary: Examination of the penis demonstrates no discharge, no masses, no lesions and a normal meatus. The scrotum is without lesions. The right epididymis is palpably normal and non-tender. The left epididymis is palpably normal and non-tender. The right testis is non-tender and without masses. The left testis is non-tender and without masses.  Lymphatics: The femoral and inguinal nodes are not enlarged  or tender.  Skin: Normal skin turgor, no visible rash and no visible skin lesions.  Neuro/Psych:. Mood  and affect are appropriate.    Results/Data Urine [Data Includes: Last 1 Day]   12Jun2013  COLOR YELLOW   APPEARANCE CLEAR   SPECIFIC GRAVITY 1.025   pH 5.0   GLUCOSE NEG mg/dL  BILIRUBIN NEG   KETONE NEG mg/dL  BLOOD NEG   PROTEIN TRACE mg/dL  UROBILINOGEN 0.2 mg/dL  NITRITE NEG   LEUKOCYTE ESTERASE NEG    Old records or history reviewed: 23 pages + epic chart.  The following images/tracing/specimen were independently visualized: [name]    Assessment Assessed  1. Chronic Renal Failure 585.9 2. Renal Neoplasm 239.5 3. Benign Prostatic Hypertrophy 600.00  Plan Benign Prostatic Hypertrophy (600.00)  1. BASIC METABOLIC PANEL  Requested for: 12Jun2013 2. PSA  Requested for: 12Jun2013 Chronic Renal Failure (585.9), Renal Neoplasm (239.5)  3. MRI-KIDNEYS  Requested for: 12Jun2013 4. Follow-up After Test Office  Follow-up  Requested for: 12Jun2013 Health Maintenance (V70.0)  5. UA With REFLEX  Done: 12Jun2013 10:09AM  Discussion/Summary I reviewed the patient's MRI images which confirm a solid enhancing right lower pole renal mass. The liver appeared benign and there is a small left adrenal lesion. I reviewed the patient's CT scan images again as well. The mass is partially exophytic but does seem to surround the lower pole collecting system and efface the fat around the collecting system. It still appears to be a renal neoplasm but there is the possibility of a urothelial cancer. I called and discussed the findings with the patient and the differences between renal and urothelial upper tract cancers. I discussed with him the nature risks benefits and alternatives to cystoscopy with right retrograde pyelogram and right ureteroscopy to evaluate the collecting system and collect cytology. We discussed the possibility of a ureteral stent. All questions answered and he elects to proceed. We discussed the possibility of needing more than one procedure to view the upper tracts.

## 2011-10-26 ENCOUNTER — Encounter (HOSPITAL_BASED_OUTPATIENT_CLINIC_OR_DEPARTMENT_OTHER)
Admission: RE | Disposition: A | Discharge status: Home / Self Care | Payer: Self-pay | Source: Ambulatory Visit | Attending: Urology

## 2011-10-26 ENCOUNTER — Encounter (HOSPITAL_BASED_OUTPATIENT_CLINIC_OR_DEPARTMENT_OTHER): Payer: Self-pay | Admitting: Anesthesiology

## 2011-10-26 ENCOUNTER — Ambulatory Visit (HOSPITAL_BASED_OUTPATIENT_CLINIC_OR_DEPARTMENT_OTHER): Payer: Medicare Other | Admitting: Anesthesiology

## 2011-10-26 ENCOUNTER — Ambulatory Visit (HOSPITAL_BASED_OUTPATIENT_CLINIC_OR_DEPARTMENT_OTHER)
Admission: RE | Admit: 2011-10-26 | Discharge: 2011-10-26 | Disposition: A | Discharge status: Home / Self Care | Payer: Medicare Other | Source: Ambulatory Visit | Attending: Urology | Admitting: Urology

## 2011-10-26 ENCOUNTER — Encounter (HOSPITAL_BASED_OUTPATIENT_CLINIC_OR_DEPARTMENT_OTHER): Payer: Self-pay | Admitting: *Deleted

## 2011-10-26 DIAGNOSIS — Z794 Long term (current) use of insulin: Secondary | ICD-10-CM | POA: Insufficient documentation

## 2011-10-26 DIAGNOSIS — Z79899 Other long term (current) drug therapy: Secondary | ICD-10-CM | POA: Insufficient documentation

## 2011-10-26 DIAGNOSIS — G4730 Sleep apnea, unspecified: Secondary | ICD-10-CM | POA: Insufficient documentation

## 2011-10-26 DIAGNOSIS — E119 Type 2 diabetes mellitus without complications: Secondary | ICD-10-CM | POA: Insufficient documentation

## 2011-10-26 DIAGNOSIS — N189 Chronic kidney disease, unspecified: Secondary | ICD-10-CM | POA: Insufficient documentation

## 2011-10-26 DIAGNOSIS — N289 Disorder of kidney and ureter, unspecified: Secondary | ICD-10-CM | POA: Insufficient documentation

## 2011-10-26 DIAGNOSIS — D4959 Neoplasm of unspecified behavior of other genitourinary organ: Secondary | ICD-10-CM | POA: Insufficient documentation

## 2011-10-26 DIAGNOSIS — E7800 Pure hypercholesterolemia, unspecified: Secondary | ICD-10-CM | POA: Insufficient documentation

## 2011-10-26 DIAGNOSIS — K219 Gastro-esophageal reflux disease without esophagitis: Secondary | ICD-10-CM | POA: Insufficient documentation

## 2011-10-26 HISTORY — DX: Personal history of other diseases of the digestive system: Z87.19

## 2011-10-26 HISTORY — DX: Unspecified osteoarthritis, unspecified site: M19.90

## 2011-10-26 HISTORY — DX: Nocturia: R35.1

## 2011-10-26 HISTORY — DX: Type 2 diabetes mellitus without complications: E11.9

## 2011-10-26 HISTORY — DX: Rash and other nonspecific skin eruption: R21

## 2011-10-26 HISTORY — DX: Adverse effect of unspecified anesthetic, initial encounter: T41.45XA

## 2011-10-26 HISTORY — DX: Obstructive sleep apnea (adult) (pediatric): G47.33

## 2011-10-26 HISTORY — DX: Other specified disorders of kidney and ureter: N28.89

## 2011-10-26 HISTORY — DX: Other complications of anesthesia, initial encounter: T88.59XA

## 2011-10-26 HISTORY — DX: Calculus of bile duct with chronic cholecystitis without obstruction: K80.44

## 2011-10-26 HISTORY — DX: Type 2 diabetes mellitus with foot ulcer: E11.621

## 2011-10-26 HISTORY — DX: Personal history of other diseases of the musculoskeletal system and connective tissue: Z87.39

## 2011-10-26 HISTORY — DX: Essential (primary) hypertension: I10

## 2011-10-26 HISTORY — DX: Frequency of micturition: R35.0

## 2011-10-26 HISTORY — DX: Non-pressure chronic ulcer of other part of unspecified foot with unspecified severity: L97.509

## 2011-10-26 HISTORY — DX: Benign prostatic hyperplasia without lower urinary tract symptoms: N40.0

## 2011-10-26 HISTORY — PX: CYSTOSCOPY/RETROGRADE/URETEROSCOPY: SHX5316

## 2011-10-26 HISTORY — DX: Hematuria, unspecified: R31.9

## 2011-10-26 HISTORY — DX: Anesthesia of skin: R20.0

## 2011-10-26 HISTORY — DX: Personal history of peptic ulcer disease: Z87.11

## 2011-10-26 HISTORY — DX: Personal history of Methicillin resistant Staphylococcus aureus infection: Z86.14

## 2011-10-26 HISTORY — DX: Long term (current) use of insulin: Z79.4

## 2011-10-26 HISTORY — DX: Personal history of urinary calculi: Z87.442

## 2011-10-26 LAB — POCT I-STAT 4, (NA,K, GLUC, HGB,HCT)
Glucose, Bld: 57 mg/dL — ABNORMAL LOW (ref 70–99)
HCT: 33 % — ABNORMAL LOW (ref 39.0–52.0)
Hemoglobin: 11.2 g/dL — ABNORMAL LOW (ref 13.0–17.0)
Potassium: 3.9 mEq/L (ref 3.5–5.1)
Sodium: 144 mEq/L (ref 135–145)

## 2011-10-26 SURGERY — CYSTOSCOPY/RETROGRADE/URETEROSCOPY
Anesthesia: General | Site: Ureter | Wound class: Clean Contaminated

## 2011-10-26 MED ORDER — EPHEDRINE SULFATE 50 MG/ML IJ SOLN
INTRAMUSCULAR | Status: DC | PRN
  Administered 2011-10-26 (×2): 10 mg via INTRAVENOUS

## 2011-10-26 MED ORDER — SODIUM CHLORIDE 0.9 % IR SOLN
Status: DC | PRN
  Administered 2011-10-26: 6000 mL

## 2011-10-26 MED ORDER — ONDANSETRON HCL 4 MG/2ML IJ SOLN
INTRAMUSCULAR | Status: DC | PRN
  Administered 2011-10-26: 4 mg via INTRAVENOUS

## 2011-10-26 MED ORDER — LACTATED RINGERS IV SOLN: INTRAVENOUS | Status: DC

## 2011-10-26 MED ORDER — CEFAZOLIN SODIUM 1-5 GM-% IV SOLN: 1.0000 g | INTRAVENOUS | Status: DC

## 2011-10-26 MED ORDER — DEXAMETHASONE SODIUM PHOSPHATE 4 MG/ML IJ SOLN
INTRAMUSCULAR | Status: DC | PRN
  Administered 2011-10-26: 10 mg via INTRAVENOUS

## 2011-10-26 MED ORDER — CEFAZOLIN SODIUM-DEXTROSE 2-3 GM-% IV SOLR
2.0000 g | INTRAVENOUS | Status: AC
  Administered 2011-10-26: 2 g via INTRAVENOUS

## 2011-10-26 MED ORDER — LIDOCAINE HCL (CARDIAC) 20 MG/ML IV SOLN
INTRAVENOUS | Status: DC | PRN
  Administered 2011-10-26: 100 mg via INTRAVENOUS

## 2011-10-26 MED ORDER — FENTANYL CITRATE 0.05 MG/ML IJ SOLN
INTRAMUSCULAR | Status: DC | PRN
  Administered 2011-10-26: 100 ug via INTRAVENOUS

## 2011-10-26 MED ORDER — IOHEXOL 350 MG/ML SOLN
INTRAVENOUS | Status: DC | PRN
  Administered 2011-10-26: 10 mL

## 2011-10-26 MED ORDER — MIDAZOLAM HCL 5 MG/5ML IJ SOLN
INTRAMUSCULAR | Status: DC | PRN
  Administered 2011-10-26: 2 mg via INTRAVENOUS

## 2011-10-26 MED ORDER — MEPERIDINE HCL 25 MG/ML IJ SOLN: 6.2500 mg | INTRAMUSCULAR | Status: DC | PRN

## 2011-10-26 MED ORDER — FENTANYL CITRATE 0.05 MG/ML IJ SOLN: 25.0000 ug | INTRAMUSCULAR | Status: DC | PRN

## 2011-10-26 MED ORDER — TRAMADOL HCL 50 MG PO TABS: 50.0000 mg | ORAL_TABLET | Freq: Four times a day (QID) | ORAL | Status: DC | PRN

## 2011-10-26 MED ORDER — PROMETHAZINE HCL 25 MG/ML IJ SOLN: 6.2500 mg | INTRAMUSCULAR | Status: DC | PRN

## 2011-10-26 MED ORDER — PROPOFOL 10 MG/ML IV EMUL
INTRAVENOUS | Status: DC | PRN
  Administered 2011-10-26: 200 mg via INTRAVENOUS

## 2011-10-26 MED ORDER — LACTATED RINGERS IV SOLN
INTRAVENOUS | Status: DC
  Administered 2011-10-26: 11:00:00 via INTRAVENOUS

## 2011-10-26 MED ORDER — CIPROFLOXACIN HCL 250 MG PO TABS: 250.0000 mg | ORAL_TABLET | Freq: Two times a day (BID) | ORAL | Status: AC

## 2011-10-26 MED ORDER — DEXTROSE IN LACTATED RINGERS 5 % IV SOLN
INTRAVENOUS | Status: DC
  Administered 2011-10-26: 08:00:00 via INTRAVENOUS

## 2011-10-26 SURGICAL SUPPLY — 39 items
ADAPTER CATH URET PLST 4-6FR (CATHETERS) IMPLANT
ADPR CATH URET STRL DISP 4-6FR (CATHETERS)
BAG DRAIN URO-CYSTO SKYTR STRL (DRAIN) ×2 IMPLANT
BAG DRN UROCATH (DRAIN) ×1
BASKET LASER NITINOL 1.9FR (BASKET) IMPLANT
BASKET STNLS GEMINI 4WIRE 3FR (BASKET) IMPLANT
BASKET ZERO TIP NITINOL 2.4FR (BASKET) IMPLANT
BRUSH URET BIOPSY 3F (UROLOGICAL SUPPLIES) IMPLANT
BSKT STON RTRVL 120 1.9FR (BASKET)
BSKT STON RTRVL GEM 120X11 3FR (BASKET)
BSKT STON RTRVL ZERO TP 2.4FR (BASKET)
CANISTER SUCT LVC 12 LTR MEDI- (MISCELLANEOUS) ×1 IMPLANT
CATH INTERMIT  6FR 70CM (CATHETERS) IMPLANT
CATH URET 5FR 28IN CONE TIP (BALLOONS)
CATH URET 5FR 28IN OPEN ENDED (CATHETERS) IMPLANT
CATH URET 5FR 70CM CONE TIP (BALLOONS) IMPLANT
CLOTH BEACON ORANGE TIMEOUT ST (SAFETY) ×2 IMPLANT
CONT SPECI 4OZ STER CLIK (MISCELLANEOUS) ×1 IMPLANT
DRAPE CAMERA CLOSED 9X96 (DRAPES) IMPLANT
ELECT REM PT RETURN 9FT ADLT (ELECTROSURGICAL)
ELECTRODE REM PT RTRN 9FT ADLT (ELECTROSURGICAL) IMPLANT
GLOVE BIO SURGEON STRL SZ7.5 (GLOVE) ×2 IMPLANT
GLOVE BIOGEL PI IND STRL 7.0 (GLOVE) IMPLANT
GLOVE BIOGEL PI INDICATOR 7.0 (GLOVE) ×2
GOWN PREVENTION PLUS LG XLONG (DISPOSABLE) ×2 IMPLANT
GOWN STRL REIN XL XLG (GOWN DISPOSABLE) ×2 IMPLANT
GUIDEWIRE 0.038 PTFE COATED (WIRE) IMPLANT
GUIDEWIRE ANG ZIPWIRE 038X150 (WIRE) IMPLANT
GUIDEWIRE STR DUAL SENSOR (WIRE) ×2 IMPLANT
IV NS IRRIG 3000ML ARTHROMATIC (IV SOLUTION) ×4 IMPLANT
KIT BALLIN UROMAX 15FX10 (LABEL) IMPLANT
KIT BALLN UROMAX 15FX4 (MISCELLANEOUS) IMPLANT
KIT BALLN UROMAX 26 75X4 (MISCELLANEOUS)
NS IRRIG 500ML POUR BTL (IV SOLUTION) ×1 IMPLANT
PACK CYSTOSCOPY (CUSTOM PROCEDURE TRAY) ×2 IMPLANT
SET HIGH PRES BAL DIL (LABEL)
SHEATH URET ACCESS 12FR/35CM (UROLOGICAL SUPPLIES) IMPLANT
SHEATH URET ACCESS 12FR/55CM (UROLOGICAL SUPPLIES) IMPLANT
SYRINGE IRR TOOMEY STRL 70CC (SYRINGE) IMPLANT

## 2011-10-26 NOTE — Anesthesia Postprocedure Evaluation (Signed)
  Anesthesia Post-op Note  Patient: Frank Ashley  Procedure(s) Performed: Procedure(s) (LRB): CYSTOSCOPY/RETROGRADE/URETEROSCOPY (N/A)  Patient Location: PACU  Anesthesia Type: General  Level of Consciousness: awake and alert   Airway and Oxygen Therapy: Patient Spontanous Breathing  Post-op Pain: mild  Post-op Assessment: Post-op Vital signs reviewed, Patient's Cardiovascular Status Stable, Respiratory Function Stable, Patent Airway and No signs of Nausea or vomiting  Post-op Vital Signs: stable  Complications: No apparent anesthesia complications

## 2011-10-26 NOTE — Discharge Instructions (Signed)
Cystoscopy (Bladder Exam) Care After Refer to this sheet in the next few weeks. These discharge instructions provide you with general information on caring for yourself after you leave the hospital. Your caregiver may also give you specific instructions. Your treatment has been planned according to the most current medical practices available, but unavoidable complications sometimes occur. If you have any problems or questions after discharge, please call your caregiver. AFTER THE PROCEDURE   There may be temporary bleeding and burning with urination.   Drink enough water and fluids to keep your urine clear or pale yellow.  FINDING OUT THE RESULTS OF YOUR TEST Not all test results are available during your visit. If your test results are not back during the visit, make an appointment with your caregiver to find out the results. Do not assume everything is normal if you have not heard from your caregiver or the medical facility. It is important for you to follow up on all of your test results. SEEK IMMEDIATE MEDICAL CARE IF:   There is an increase in blood in the urine or you are passing clots.   There is difficulty passing urine.   You develop the chills.   You have an oral temperature above 102 F (38.9 C), not controlled by medicine.   Belly (abdominal) pain develops.  Document Released: 11/06/2004 Document Revised: 04/08/2011 Document Reviewed: 09/04/2007 ExitCare Patient Information 2012 [place], [facility] Post Anesthesia Home Care Instructions  Activity: Get plenty of rest for the remainder of the day. A responsible adult should stay with you for 24 hours following the procedure.  For the next 24 hours, DO NOT: -Drive a car -Advertising copywriter -Drink alcoholic beverages -Take any medication unless instructed by your physician -Make any legal decisions or sign important papers.  Meals: Start with liquid foods such as gelatin or soup. Progress to regular foods as tolerated. Avoid  greasy, spicy, heavy foods. If nausea and/or vomiting occur, drink only clear liquids until the nausea and/or vomiting subsides. Call your physician if vomiting continues.  Special Instructions/Symptoms: Your throat may feel dry or sore from the anesthesia or the breathing tube placed in your throat during surgery. If this causes discomfort, gargle with warm salt water. The discomfort should disappear within 24 hours.  [name]

## 2011-10-26 NOTE — Transfer of Care (Signed)
Immediate Anesthesia Transfer of Care Note  Patient: Frank Ashley  Procedure(s) Performed: Procedure(s) (LRB): CYSTOSCOPY/RETROGRADE/URETEROSCOPY (N/A)  Patient Location: PACU  Anesthesia Type: General  Level of Consciousness: awake, alert  and oriented  Airway & Oxygen Therapy: Patient Spontanous Breathing and Patient connected to face mask oxygen  Post-op Assessment: Report given to PACU RN and Post -op Vital signs reviewed and stable  Post vital signs: Reviewed and stable  Complications: No apparent anesthesia complications

## 2011-10-26 NOTE — Progress Notes (Signed)
Pt has drainage catheter RLQ intact & draining sm amt cl brown drainage. Pt had this prior to surgery. Secured w medipore tape.

## 2011-10-26 NOTE — Anesthesia Procedure Notes (Signed)
Procedure Name: LMA Insertion Date/Time: 10/26/2011 8:47 AM Performed by: Norva Pavlov Pre-anesthesia Checklist: Patient identified, Emergency Drugs available, Suction available and Patient being monitored Patient Re-evaluated:Patient Re-evaluated prior to inductionOxygen Delivery Method: Circle System Utilized Preoxygenation: Pre-oxygenation with 100% oxygen Intubation Type: IV induction Ventilation: Mask ventilation without difficulty LMA: LMA with gastric port inserted LMA Size: 4.0 Number of attempts: 1 Placement Confirmation: positive ETCO2 Tube secured with: Tape Dental Injury: Teeth and Oropharynx as per pre-operative assessment

## 2011-10-26 NOTE — Anesthesia Preprocedure Evaluation (Addendum)
Anesthesia Evaluation  Patient identified by MRN, date of birth, ID band Patient awake    Reviewed: Allergy & Precautions, H&P , NPO status , Patient's Chart, lab work & pertinent test results  History of Anesthesia Complications Negative for: history of anesthetic complications  Airway Mallampati: III TM Distance: >3 FB Neck ROM: full    Dental  (+) Dental Advisory Given and Caps,    Pulmonary sleep apnea and Continuous Positive Airway Pressure Ventilation ,  breath sounds clear to auscultation  Pulmonary exam normal       Cardiovascular Exercise Tolerance: Good hypertension, Pt. on medications + CAD and + Cardiac Stents Rhythm:regular Rate:Normal  Stent 1997. OK now   Neuro/Psych Peripheral neuropathy  Neuromuscular disease negative neurological ROS  negative psych ROS   GI/Hepatic negative GI ROS, Neg liver ROS, GERD-  Medicated and Controlled,Acute pancreatitis.   Endo/Other  Diabetes mellitus-, Well Controlled, Type 2, Insulin DependentMorbid obesity  Renal/GU negative Renal ROS  negative genitourinary   Musculoskeletal   Abdominal   Peds  Hematology negative hematology ROS (+)   Anesthesia Other Findings Last intubation Grade 3 view over a bougie  Reproductive/Obstetrics negative OB ROS                          Anesthesia Physical  Anesthesia Plan  ASA: III  Anesthesia Plan: General   Post-op Pain Management:    Induction: Intravenous  Airway Management Planned: LMA  Additional Equipment:   Intra-op Plan:   Post-operative Plan: Extubation in OR  Informed Consent: I have reviewed the patients History and Physical, chart, labs and discussed the procedure including the risks, benefits and alternatives for the proposed anesthesia with the patient or authorized representative who has indicated his/her understanding and acceptance.   Dental Advisory Given  Plan Discussed  with: CRNA and Surgeon  Anesthesia Plan Comments:         Anesthesia Quick Evaluation

## 2011-10-26 NOTE — Interval H&P Note (Signed)
History and Physical Interval Note:  10/26/2011 8:33 AM  Frank Ashley  has presented today for surgery, with the diagnosis of right renal mass.  The various methods of treatment have been discussed with the patient and family. After consideration of risks, benefits and other options for treatment, the patient has consented to  Procedure(s) (LRB): CYSTOSCOPY/RETROGRADE/URETEROSCOPY/URETERAL STENT PLACEMENT (RIGHT) as a surgical intervention .  The patient's history has been reviewed, patient examined, no change in status, stable for surgery.  I have reviewed the patients' chart and labs.   I discussed again with patient and wife this right renal mass may be benign or malignant. We discussed evaluation of collecting system and difference between renal cancer and urothelial cancer. We discussed role of renal mass biopsy, the nature, R/B and what we would do with a positive or negative result. If his collecting system and cytologies are normal we would want to proceed with partial possible radical nephrectomy. We discussed again surgical considerations of a partial nephrectomy which is not feasible. He knows his left kidney function is already compromised, but he would need to rely on teh remaining function of the left kidney if the right were removed. Questions were answered to the patient's satisfaction.    Antony Haste

## 2011-10-26 NOTE — Brief Op Note (Signed)
10/26/2011  9:15 AM  PATIENT:  [name]  71 y.o. male  PRE-OPERATIVE DIAGNOSIS: Right renal mass  POST-OPERATIVE DIAGNOSIS:  RIGHT RENAL MASS  PROCEDURE:  Procedure(s) (LRB): CYSTOSCOPY/RETROGRADE PYELOGRAM RIGHT EXAM UNDER ANESTHESIA  SURGEON:  Surgeon(s) and Role:    * Antony Haste, MD - Primary   ANESTHESIA:   general  EBL:  minimal  BLOOD ADMINISTERED:none  DRAINS: none   LOCAL MEDICATIONS USED:  NONE  SPECIMEN:  Aspirate 1) bladder cath for cytology; 2) right renal pelvic wash for cytology  DISPOSITION OF SPECIMEN:  PATHOLOGY  PLAN OF CARE: Discharge to home after PACU  PATIENT DISPOSITION:  PACU - hemodynamically stable.   Delay start of Pharmacological VTE agent (>24hrs) due to surgical blood loss or risk of bleeding: not applicable  [id number]

## 2011-10-27 ENCOUNTER — Telehealth (INDEPENDENT_AMBULATORY_CARE_PROVIDER_SITE_OTHER): Payer: Self-pay | Admitting: Surgery

## 2011-10-27 ENCOUNTER — Encounter: Payer: Self-pay | Admitting: Cardiology

## 2011-10-27 ENCOUNTER — Ambulatory Visit (INDEPENDENT_AMBULATORY_CARE_PROVIDER_SITE_OTHER): Payer: Medicare Other | Admitting: Family Medicine

## 2011-10-27 DIAGNOSIS — I2510 Atherosclerotic heart disease of native coronary artery without angina pectoris: Secondary | ICD-10-CM

## 2011-10-27 DIAGNOSIS — E1142 Type 2 diabetes mellitus with diabetic polyneuropathy: Secondary | ICD-10-CM | POA: Insufficient documentation

## 2011-10-27 DIAGNOSIS — Z01818 Encounter for other preprocedural examination: Secondary | ICD-10-CM

## 2011-10-27 DIAGNOSIS — E785 Hyperlipidemia, unspecified: Secondary | ICD-10-CM | POA: Insufficient documentation

## 2011-10-27 DIAGNOSIS — Z794 Long term (current) use of insulin: Secondary | ICD-10-CM

## 2011-10-27 DIAGNOSIS — E119 Type 2 diabetes mellitus without complications: Secondary | ICD-10-CM

## 2011-10-27 DIAGNOSIS — I10 Essential (primary) hypertension: Secondary | ICD-10-CM | POA: Insufficient documentation

## 2011-10-27 NOTE — Op Note (Signed)
NAME:  Frank Ashley, Frank Ashley NO.:  [id number]  MEDICAL RECORD NO.:  [id number]  LOCATION:                               FACILITY:  [facility]  PHYSICIAN:  Jerilee Field, MD   DATE OF BIRTH:  [birth year]  DATE OF PROCEDURE:  10/26/2011 DATE OF DISCHARGE:                              OPERATIVE REPORT   PREOPERATIVE DIAGNOSIS:  Right renal mass.  POSTOPERATIVE DIAGNOSIS:  Right renal mass.  PROCEDURE:  Exam under anesthesia with cystoscopy and right retrograde pyelogram.  SURGEON:  Jerilee Field, MD  TYPE OF ANESTHESIA:  General.  FINDINGS:  On exam under anesthesia, the patient had a normal penis and testicles without lesion or mass.  He has a suprapubic scar in the midline from placement and removal of a prior penile prosthesis.  Right retrograde pyelogram; there was a normal collecting system on the right without filling defect or stricture.  The renal pelvis and right ureter were also normal without filling defect or stricture.  The right lower pole filled out completely without any abnormality.  On digital rectal exam, the prostate was benign without hard area or nodule.  Also, there was no BPH.  There were no abdominal masses on exam.  DESCRIPTION OF PROCEDURE:  After consent was obtained, the patient was brought to the operating room.  A time-out was performed to confirm the patient and procedure.  After adequate anesthesia, he was placed in the lithotomy position and examined with findings previously dictated.  The patient was then prepped and draped in the usual sterile fashion.  The cystoscope was passed per urethra.  The bladder was examined in its entirety with the 30 and 70 degree lens and noted to be normal with mild trabeculation.  Prostatic urethra was short and nonobstructing and the urethra was normal.  The trigone and ureteral orifices were normal.  The right ureteral orifice was cannulated with a 6-French open-ended catheter which was  advanced up to the right renal pelvis under fluoroscopic guidance.  On scout imaging, there were no calcifications seen in the region of the kidneys.  To the kidney, there appeared to be a clip in the abdomen.  The right renal pelvis was then washed with saline and this was collected for cytology.  It should also be noted when the cystoscope was first advanced in the bladder, the urine was drained and sent for cytology.  The right renal wash was collected for cytology.  After an adequate specimen and several washings, a right retrograde pyelogram was obtained with retrograde injection of contrast. The findings previously dictated.  Given the normal appearance of the anatomy on retrograde, I did not feel the need to perform direct visual exam with ureteroscopy.  The patient was then awakened and taken to the recovery room in stable condition.  COMPLICATIONS:  None.  BLOOD LOSS:  None.  DRAINS:  None.  SPECIMENS: 1. Bladder catheterization for cytology. 2. Right renal pelvic wash for cytology.  DISPOSITION OF SPECIMENS:  To pathology.  The patient disposition, he was awakened, taken to recovery room in stable condition.  He would be discharged to home.          ______________________________ Molli Hazard  Mena Goes, MD     ME/MEDQ  D:  10/26/2011  T:  10/26/2011  Job:  [id number]

## 2011-10-27 NOTE — Progress Notes (Signed)
  Subjective:    Patient ID: Frank Ashley, male    DOB: Oct 18, 1940, 71 y.o.   MRN: [id number]  HPI  Patient is seen for medical followup. He has probable renal cell cancer and has had gallstones and plans surgery for nephrectomy and cholecystectomy. His blood sugars have been very labile. They've been ranging from 50s up to 400. Poor dietary compliance.  Takes combination 70/30 insulin 80 units in the morning and 70 units at night. No recent hypoglycemia.  Increased anxiety symptoms regarding his upcoming surgery. He's been taking alprazolam 0.5 mg up to 3 times a day. This is helping sleep. He is taking hydromorphone for his abdominal pain which is controlling symptoms fairly well.  He has past cardiac history. Reportedly had some type of stent back in 1996. He has not seen a cardiologist in quite some time. No chest pain. Nonsmoker. Needs cardiac clearance for upcoming surgery   Review of Systems  Constitutional: Negative for fatigue.  Eyes: Negative for visual disturbance.  Respiratory: Negative for cough, chest tightness and shortness of breath.   Cardiovascular: Negative for chest pain, palpitations and leg swelling.  Neurological: Negative for dizziness, syncope, weakness, light-headedness and headaches.       Objective:   Physical Exam  Constitutional: He appears well-developed and well-nourished.  Neck: Neck supple. No thyromegaly present.  Cardiovascular: Normal rate and regular rhythm.   Pulmonary/Chest: Effort normal and breath sounds normal. No respiratory distress. He has no wheezes. He has no rales.  Abdominal: Soft. He exhibits no mass. There is no tenderness.  Musculoskeletal: He exhibits no edema.          Assessment & Plan:  #1 CAD history. Needs cardiac clearance for upcoming surgery which will include cholecystectomy and nephrectomy. We'll set up referral. #2 type 2 diabetes. History of poor control. Poor dietary compliance. Continue close monitoring. A1c at  followup following his surgery.

## 2011-10-27 NOTE — Telephone Encounter (Signed)
Pt calling in requesting a refill on Hydromorphone 4mg  that Dr Luisa Hart prescribed a few wks a go b/c the pt can not tolerate Tramadol that Dr Abbey Chatters prescribed last week. The pt states he gets the shakes with the Tramadol and it's not helping with the pain. Pls call pt. When the script is ready.

## 2011-10-28 ENCOUNTER — Encounter: Payer: Self-pay | Admitting: Cardiology

## 2011-10-28 ENCOUNTER — Other Ambulatory Visit (INDEPENDENT_AMBULATORY_CARE_PROVIDER_SITE_OTHER): Payer: Self-pay | Admitting: General Surgery

## 2011-10-28 ENCOUNTER — Ambulatory Visit (INDEPENDENT_AMBULATORY_CARE_PROVIDER_SITE_OTHER): Payer: Medicare Other | Admitting: Cardiology

## 2011-10-28 ENCOUNTER — Telehealth (INDEPENDENT_AMBULATORY_CARE_PROVIDER_SITE_OTHER): Payer: Self-pay

## 2011-10-28 VITALS — BP 124/68 | HR 59 | Ht 73.0 in | Wt 289.0 lb

## 2011-10-28 DIAGNOSIS — R9430 Abnormal result of cardiovascular function study, unspecified: Secondary | ICD-10-CM

## 2011-10-28 DIAGNOSIS — Z01810 Encounter for preprocedural cardiovascular examination: Secondary | ICD-10-CM

## 2011-10-28 DIAGNOSIS — I2510 Atherosclerotic heart disease of native coronary artery without angina pectoris: Secondary | ICD-10-CM

## 2011-10-28 DIAGNOSIS — R0989 Other specified symptoms and signs involving the circulatory and respiratory systems: Secondary | ICD-10-CM

## 2011-10-28 MED ORDER — HYDROMORPHONE HCL 4 MG PO TABS: 4.0000 mg | ORAL_TABLET | Freq: Four times a day (QID) | ORAL | Status: DC | PRN

## 2011-10-28 NOTE — Telephone Encounter (Signed)
Patient walked in to our office requesting a refill on Hydromorphone 4 mg.  Patient states the Tramadol causes tremors. Dr. Luisa Hart on vacation, reviewed with Dr. Biagio Quint and rx for Hydromorphone 4 mg #30, 0 refills.

## 2011-10-28 NOTE — Assessment & Plan Note (Signed)
The patient has known coronary disease. His last cath was in 2000. He had mild in-stent restenosis. He had a 60% LAD lesion. He has not had significant symptoms. However he has diabetes. The operation being considered is substantial. Because of this we really do need to know more information about the patient. He will have a Lexiscan Myoview nuclear stress study.

## 2011-10-28 NOTE — Assessment & Plan Note (Signed)
Historically the patient has normal LV function. However it was assessed last in 2000. He has diabetes and known prior coronary disease. Two-dimensional echo will be done to reassess LV function.

## 2011-10-28 NOTE — Progress Notes (Signed)
HPI This very pleasant gentleman is referred for preop cardiac clearance for a significant abdominal operation. He had cholecystitis and he has a drain in place. At that time the diagnosis was made he was also noted that he has a renal cell carcinoma. He is going to have a combined operation to have his kidney removed and his gallbladder removed. He has had some other abdominal procedures. Surgery will have to be done from the right side of his abdomen.   The patient does have a significant cardiac history. He had severe pain in his right arm in 1996. At that time he was cardiac ischemia and he had a stent placed in his circumflex. In 2000 cardiac catheterization was done in [place] and it showed that he had 50% in-stent restenosis. He had other mild disease of the LAD. He has not been assessed since 2000. He has had no return of the shoulder discomfort. He might feel a rare sensation in that area but it is not exertional. He does have very significant risk factors. He has peripheral neuropathy with his diabetes. He's not having chest pain or shortness of breath.  As part of this evaluation I have carefully reviewed prior primary care notes. I have also searched for prior cardiac records and found his cath report from January, 2000  Allergies  Allergen Reactions  . Hydrocodone Shortness Of Breath  . Tramadol     Unable to take makes pt nervous  . Sulfa Antibiotics Rash    Current Outpatient Prescriptions  Medication Sig Dispense Refill  . acetaminophen (TYLENOL) 325 MG tablet Take 2 tablets (650 mg total) by mouth every 6 (six) hours as needed (or Fever >/= 101).      [name] ALPRAZolam (XANAX) 0.5 MG tablet Take 1 tablet (0.5 mg total) by mouth 3 (three) times daily as needed. For anxiety.  30 tablet  0  . aspirin 325 MG EC tablet Take 325 mg by mouth 2 (two) times daily.       . ciprofloxacin (CIPRO) 250 MG tablet Take 1 tablet (250 mg total) by mouth 2 (two) times daily.  6 tablet  0  .  esomeprazole (NEXIUM) 40 MG capsule Take 40 mg by mouth daily before breakfast. HAS ONLY TWO MORE PILLS LEFT THEN HE WILL TAKE PRILOSEC      . fenofibrate 160 MG tablet TAKE ONE TABLET BY MOUTH EVERY DAY  90 tablet  3  . furosemide (LASIX) 40 MG tablet Take 40 mg by mouth daily as needed. For fluid retention.      . gabapentin (NEURONTIN) 800 MG tablet TAKE ONE TABLET 5 TIMES A DAY  450 tablet  5  . insulin NPH-insulin regular (NOVOLIN 70/30) (70-30) 100 UNIT/ML injection Inject into the skin 2 (two) times daily with a meal. Inject 80  units subcutaneously in the morning and 70 units in the evening as directed      . lisinopril (PRINIVIL,ZESTRIL) 10 MG tablet Take 1 tablet (10 mg total) by mouth daily.  90 tablet  3  . omeprazole (PRILOSEC) 20 MG capsule TAKE ONE CAPSULE BY MOUTH TWICE DAILY  180 capsule  5  . oxiconazole (OXISTAT) 1 % CREA Apply 1 application topically as needed. For jock itch.      . simvastatin (ZOCOR) 40 MG tablet Take 1 tablet (40 mg total) by mouth at bedtime.  90 tablet  3  . sodium chloride irrigation 0.9 % irrigation Irrigate with 1,000 mLs as directed as needed. Irrigation for  pertcutanous chole skin area right upper abd.      . triamcinolone cream (KENALOG) 0.1 % Apply 1 application topically 2 (two) times daily as needed. For rash on back.        History   Social History  . Marital Status: Married    Spouse Name: N/A    Number of Children: N/A  . Years of Education: N/A   Occupational History  . Not on file.   Social History Main Topics  . Smoking status: Former Smoker -- 0.2 packs/day for 2 years    Types: Cigarettes    Quit date: 02/25/1960  . Smokeless tobacco: Never Used  . Alcohol Use: No  . Drug Use: No  . Sexually Active: Not on file   Other Topics Concern  . Not on file   Social History Narrative   Lives at home with wife [place]. Has 4 grown children and 13 grandchildren. Doesn't smoke, no drugs, no alcohol.     Family History  Problem  Relation Age of Onset  . Colon cancer Mother 54  . Colon cancer Sister 22  . Colon cancer Brother 29  . Stomach cancer Neg Hx     Past Medical History  Diagnosis Date  . Hyperlipidemia   . Renal mass, right   . Diabetes mellitus type 2, insulin dependent   . Diabetic foot ulcer CURRENT RIGHT GREAT TOE WOUND W/ DRAINAGE --  DRESSING CHANGE EVERY OTHER DAY  . History of acute pancreatitis 2008  . OSA (obstructive sleep apnea) CPAP USED UP UNTIL 2 YRS AGO  WHEN MASK WAS DAMAGED   . Renal insufficiency   . GERD (gastroesophageal reflux disease)   . Peripheral neuropathy   . Left leg numbness COMPLETE NUMBNESS FROM HIP DOWN TO FOOT  . Numbness in right leg FROM KNEE DOWN TO FOOT  . History of kidney stones AGE 89  . BPH (benign prostatic hypertrophy)   . Choledocholithiasis with chronic cholecystitis     BILE DUCT STONES AND GALLSTONES  . History of gout STABLE PER PT  . Arthritis HANDS  . History of MRSA infection 10 YRS AGO /  SUPERFICIAL SKIN AREA  . Generalized rash RIGHT SHOULDER/ BACK AREA  . Nocturia   . Frequency of urination   . Hematuria   . Hypertension   . History of peptic ulcer disease   . CAD (coronary artery disease)     Circumflex stent 1996  //  catheterization January, 2000, 50-60% mid LAD,  dominant circumflex-50% in-stent restenosis similar to a cath in 1999, RCA small and nondominant,, EF 60%  . Complication of anesthesia clostraphobic  . Preop cardiovascular exam     Cardiac clearance for nephrectomy and cholecystectomy June, 2013  . Ejection fraction     EF 60%, catheterization, 2000    Past Surgical History  Procedure Date  . Penile prosthesis implant 2008    REMOVED 2010 DUE TO MALFUNCTION  . Esophagogastroduodenoscopy 09/16/2011    Procedure: ESOPHAGOGASTRODUODENOSCOPY (EGD);  Surgeon: Barrie Folk, MD;  Location: [facility];  Service: Endoscopy;  Laterality: N/A;  . Ct perc cholecystostomy 09-17-2011  . Right great toe surg. 2000  . Surg for  repair of perferated peptic ulcer AGE 56  . Bilroth ii procedure AGE 11    2 MONTHS LATER,  BILROTH II RECONSTRUCTION  . Coronary angioplasty with stent placement 1996    STENTING X1 CIRCUMFLEX  . Cardiac catheterization 05-04-1998--- REPORT W/ CHART    50%  IN-STENT RESTENOSIS  OF CIRCUMFLEX/ 20% LEFT MAIN/ 50-60% LAD/ RCA NORMAL/ LV NORMAL / EF 60%  . Cataract extraction w/ intraocular lens  implant, bilateral 2005    ROS  Patient denies fever, chills, headache, sweats, rash, change in vision, change in hearing, chest pain, cough, nausea vomiting, urinary symptoms. All other systems are reviewed and are negative.  PHYSICAL EXAM  Patient is overweight. He is oriented to person time and place. Affect is normal. He is a very pleasant gentleman. There is no jugulovenous distention.  There are no carotid bruits. Lungs are clear. Respiratory effort is nonlabored. Cardiac exam reveals S1 and S2. There are no clicks or significant murmurs. The abdomen is protuberant but soft. His gallbladder drain is in place. There is trace peripheral edema. There are no musculoskeletal deformities. He does have the neurologic problems with his neuropathy that affects his walking. There are no skin rashes.  Filed Vitals:   10/28/11 1057  BP: 124/68  Pulse: 59  Height: 6\' 1"  (1.854 m)  Weight: 289 lb (131.09 kg)   EKG is done today and reviewed by me. There is normal sinus rhythm. The EKG is normal. I have compared to the prior tracing. There is no significant change.  ASSESSMENT & PLAN

## 2011-10-28 NOTE — Assessment & Plan Note (Signed)
The patient's cardiac status is stable. As outlined he needs further evaluation at this time however before he can have his significant abdominal operation. We will arrange for 2-D echo to reassess LV function. He will have a nuclear stress study also. We will review these results soon after they are done and be in touch with the patient and his physicians. Hopefully these studies will be stable and we can clear him for his operation.

## 2011-10-28 NOTE — Patient Instructions (Addendum)
Your physician has requested that you have a lexiscan myoview. For further information please visit [email or web address]. Please follow instruction sheet, as given.   Your physician has requested that you have an echocardiogram. Echocardiography is a painless test that uses sound waves to create images of your heart. It provides your doctor with information about the size and shape of your heart and how well your heart's chambers and valves are working. This procedure takes approximately one hour. There are no restrictions for this procedure.   

## 2011-10-29 NOTE — Telephone Encounter (Signed)
What is urology wanting to do?  I have heard nothing from them.  Not needing to rush to OR.  NEEDS 6- 8 WEEKS AND THAT TIME IS LATE July EARLY AUGUST before surgery safe and I have told him that.

## 2011-11-01 ENCOUNTER — Ambulatory Visit (HOSPITAL_COMMUNITY): Payer: Medicare Other | Attending: Cardiology | Admitting: Radiology

## 2011-11-01 VITALS — BP 110/51 | HR 61 | Ht 73.0 in | Wt 282.0 lb

## 2011-11-01 DIAGNOSIS — R5383 Other fatigue: Secondary | ICD-10-CM | POA: Insufficient documentation

## 2011-11-01 DIAGNOSIS — Z87891 Personal history of nicotine dependence: Secondary | ICD-10-CM | POA: Insufficient documentation

## 2011-11-01 DIAGNOSIS — I2510 Atherosclerotic heart disease of native coronary artery without angina pectoris: Secondary | ICD-10-CM | POA: Insufficient documentation

## 2011-11-01 DIAGNOSIS — R079 Chest pain, unspecified: Secondary | ICD-10-CM | POA: Insufficient documentation

## 2011-11-01 DIAGNOSIS — E785 Hyperlipidemia, unspecified: Secondary | ICD-10-CM | POA: Insufficient documentation

## 2011-11-01 DIAGNOSIS — E119 Type 2 diabetes mellitus without complications: Secondary | ICD-10-CM | POA: Insufficient documentation

## 2011-11-01 DIAGNOSIS — R112 Nausea with vomiting, unspecified: Secondary | ICD-10-CM | POA: Insufficient documentation

## 2011-11-01 DIAGNOSIS — E669 Obesity, unspecified: Secondary | ICD-10-CM | POA: Insufficient documentation

## 2011-11-01 DIAGNOSIS — Z01810 Encounter for preprocedural cardiovascular examination: Secondary | ICD-10-CM

## 2011-11-01 DIAGNOSIS — R5381 Other malaise: Secondary | ICD-10-CM | POA: Insufficient documentation

## 2011-11-01 DIAGNOSIS — R9430 Abnormal result of cardiovascular function study, unspecified: Secondary | ICD-10-CM

## 2011-11-01 DIAGNOSIS — Z794 Long term (current) use of insulin: Secondary | ICD-10-CM | POA: Insufficient documentation

## 2011-11-01 DIAGNOSIS — R0602 Shortness of breath: Secondary | ICD-10-CM | POA: Insufficient documentation

## 2011-11-01 MED ORDER — TECHNETIUM TC 99M TETROFOSMIN IV KIT
10.0000 | PACK | Freq: Once | INTRAVENOUS | Status: AC | PRN
  Administered 2011-11-01: 10 via INTRAVENOUS

## 2011-11-01 MED ORDER — REGADENOSON 0.4 MG/5ML IV SOLN
0.4000 mg | Freq: Once | INTRAVENOUS | Status: AC
  Administered 2011-11-01: 0.4 mg via INTRAVENOUS

## 2011-11-01 MED ORDER — TECHNETIUM TC 99M TETROFOSMIN IV KIT
30.0000 | PACK | Freq: Once | INTRAVENOUS | Status: AC | PRN
  Administered 2011-11-01: 30 via INTRAVENOUS

## 2011-11-01 NOTE — Progress Notes (Signed)
[facility] SITE 3 NUCLEAR MED [address] [place] [address] [phone]  Cardiology Nuclear Med Study  Cordon Frank Ashley is a 71 y.o. male     MRN : [id number]     DOB: May 31, 1940  Procedure Date: 11/01/2011  Nuclear Med Background Indication for Stress Test:  Evaluation for Ischemia, Stent Patency and Clearance for Pending Abdominal Surgery on 11/05/11 by Dr. Luisa Hart and Dr. Mena Goes History:  '96 Stent-CFX; '00 Cath:N/O CAD with 50% in-stent restenosis, EF=60% Cardiac Risk Factors: History of Smoking, IDDM Type 2, Lipids and Obesity  Symptoms:  Chest Pain (last episode of chest discomfort was while under the camera this morning), DOE/SOB, Fatigue and Nausea/Vomiting   Nuclear Pre-Procedure Caffeine/Decaff Intake:  None NPO After: 11:00pm   Lungs:  clear O2 Sat: 98% on room air. IV 0.9% NS with Angio Cath:  20g  IV Site: R Antecubital  IV Started by:  Stanton Kidney, EMT-P  Chest Size (in):  54 Cup Size: n/a  Height: 6\' 1"  (1.854 m)  Weight:  282 lb (127.914 kg)  BMI:  Body mass index is 37.21 kg/(m^2). Tech Comments:  CBG=62 @ 9:00 am.    Nuclear Med Study 1 or 2 day study: 1 day  Stress Test Type:  Lexiscan  Reading MD: Frank Millers, MD  Order Authorizing Provider:  Willa Rough, MD  Resting Radionuclide: Technetium 30m Tetrofosmin  Resting Radionuclide Dose: 11.0 mCi   Stress Radionuclide:  Technetium 15m Tetrofosmin  Stress Radionuclide Dose: 33.0 mCi           Stress Protocol Rest HR: 61 Stress HR: 83  Rest BP: 110/51 Stress BP: 109/45  Exercise Time (min): n/a METS: n/a   Predicted Max HR: 150 bpm % Max HR: 55.33 bpm Rate Pressure Product: 9047   Dose of Adenosine (mg):  n/a Dose of Lexiscan: 0.4 mg  Dose of Atropine (mg): n/a Dose of Dobutamine: n/a mcg/kg/min (at max HR)  Stress Test Technologist: Smiley Houseman, CMA-N  Nuclear Technologist:  Domenic Polite, CNMT     Rest Procedure:  Myocardial perfusion imaging was performed at rest 45  minutes following the intravenous administration of Technetium 36m Tetrofosmin.  Rest ECG: Atrial bigeminy.  Stress Procedure:  The patient received IV Lexiscan 0.4 mg over 15-seconds.  Technetium 68m Tetrofosmin injected at 30-seconds.  There were no significant changes with Lexiscan bolus, occasional PVC's were noted.  Quantitative spect images were obtained after a 45 minute delay.  Stress ECG: No significant ST segment change suggestive of ischemia.  QPS Raw Data Images:  Acquisition technically good; normal left ventricular size. Stress Images:  There is decreased uptake in the inferior wall. Rest Images:  There is decreased uptake in the inferior wall, less prominent compared to the stress images. Subtraction (SDS):  These findings are consistent with inferior thinning vs prior infarct and very mild inferior ischemia. Transient Ischemic Dilatation (Normal <1.22):  1.09 Lung/Heart Ratio (Normal <0.45):  0.37  Quantitative Gated Spect Images QGS EDV:  129 ml QGS ESV:  52 ml  Impression Exercise Capacity:  Lexiscan with no exercise. BP Response:  Normal blood pressure response. Clinical Symptoms:  No chest pain or dyspnea. ECG Impression:  No significant ST segment change suggestive of ischemia. Comparison with Prior Nuclear Study: No previous nuclear study performed  Overall Impression:  Low risk stress nuclear study with a small, mild, partially reversible inferior defect consistent with inferior thinning vs small previous infarct and mild inferior ischemia.  LV Ejection Fraction:  59%.  LV Wall Motion:  NL LV Function; NL Wall Motion   Frank Ashley

## 2011-11-02 ENCOUNTER — Other Ambulatory Visit (INDEPENDENT_AMBULATORY_CARE_PROVIDER_SITE_OTHER): Payer: Self-pay

## 2011-11-02 ENCOUNTER — Other Ambulatory Visit (HOSPITAL_COMMUNITY): Payer: Self-pay | Admitting: Cardiology

## 2011-11-02 ENCOUNTER — Telehealth (INDEPENDENT_AMBULATORY_CARE_PROVIDER_SITE_OTHER): Payer: Self-pay

## 2011-11-02 ENCOUNTER — Ambulatory Visit (HOSPITAL_COMMUNITY): Payer: Medicare Other | Attending: Cardiology | Admitting: Radiology

## 2011-11-02 DIAGNOSIS — K8050 Calculus of bile duct without cholangitis or cholecystitis without obstruction: Secondary | ICD-10-CM

## 2011-11-02 DIAGNOSIS — E785 Hyperlipidemia, unspecified: Secondary | ICD-10-CM | POA: Insufficient documentation

## 2011-11-02 DIAGNOSIS — R0989 Other specified symptoms and signs involving the circulatory and respiratory systems: Secondary | ICD-10-CM | POA: Insufficient documentation

## 2011-11-02 DIAGNOSIS — E119 Type 2 diabetes mellitus without complications: Secondary | ICD-10-CM | POA: Insufficient documentation

## 2011-11-02 DIAGNOSIS — R9430 Abnormal result of cardiovascular function study, unspecified: Secondary | ICD-10-CM

## 2011-11-02 DIAGNOSIS — Z01810 Encounter for preprocedural cardiovascular examination: Secondary | ICD-10-CM | POA: Insufficient documentation

## 2011-11-02 DIAGNOSIS — I517 Cardiomegaly: Secondary | ICD-10-CM | POA: Insufficient documentation

## 2011-11-02 DIAGNOSIS — Z87891 Personal history of nicotine dependence: Secondary | ICD-10-CM | POA: Insufficient documentation

## 2011-11-02 DIAGNOSIS — I2510 Atherosclerotic heart disease of native coronary artery without angina pectoris: Secondary | ICD-10-CM

## 2011-11-02 MED ORDER — HYDROMORPHONE HCL 4 MG PO TABS
4.0000 mg | ORAL_TABLET | Freq: Four times a day (QID) | ORAL | Status: AC | PRN
Start: 2011-11-02 — End: 2011-11-12

## 2011-11-02 MED ORDER — PERFLUTREN PROTEIN A MICROSPH IV SUSP
3.0000 mL | Freq: Once | INTRAVENOUS | Status: AC
  Administered 2011-11-02: 3 mL via INTRAVENOUS

## 2011-11-02 NOTE — Telephone Encounter (Signed)
Called patient to make sure he got surgery information. Patient wants more pain medicine. [facility] wrote prescription and it is up front for him to pick up

## 2011-11-02 NOTE — Telephone Encounter (Signed)
Pt calling concerned that he has not heard back with surgery date. Please call pt with update on progress scheduling surgery with Dr Luisa Hart and urologist.

## 2011-11-02 NOTE — Progress Notes (Signed)
Echocardiogram performed.  

## 2011-11-03 ENCOUNTER — Other Ambulatory Visit: Payer: Self-pay | Admitting: Urology

## 2011-11-03 ENCOUNTER — Other Ambulatory Visit (HOSPITAL_COMMUNITY): Payer: Self-pay | Admitting: Interventional Radiology

## 2011-11-03 ENCOUNTER — Inpatient Hospital Stay (HOSPITAL_COMMUNITY)
Admission: RE | Admit: 2011-11-03 | Discharge: 2011-11-03 | Payer: Medicare Other | Source: Ambulatory Visit | Attending: Interventional Radiology | Admitting: Interventional Radiology

## 2011-11-03 ENCOUNTER — Telehealth (INDEPENDENT_AMBULATORY_CARE_PROVIDER_SITE_OTHER): Payer: Self-pay | Admitting: General Surgery

## 2011-11-03 DIAGNOSIS — K819 Cholecystitis, unspecified: Secondary | ICD-10-CM

## 2011-11-03 NOTE — Progress Notes (Addendum)
Patient ID: Frank Ashley, male   DOB: [birth year], 71 y.o.   MRN: [id number] Pt seen in IR dept today secondary to irritation/ mild erythema at cholecystostomy insertion site. Pt is s/p percutaneous cholecystostomy on 09/17/2011 for cholecystitis. He has had some mild burning at GB drain site for past few days. He denies fevers, N/V or drainage from site. Catheter is functioning well and there is no hemobilia. On exam, there is some mild erythema at site but no frank drainage. Green bile is in bag. Suture is intact. Case was d/w Dr. Miles Costain. We will prescribe keflex 500 mg po bid for 7 days to cover potential superficial skin infection. A new dressing was applied to site. Dr. Rosezena Sensor office was notified of above visit and plans were discussed for possible drain exchange . Pt is tent scheduled for surgery in August. They will contact us if exchange is needed prior to surgery.

## 2011-11-03 NOTE — Telephone Encounter (Signed)
Patient at IR to evaluate cholecystotomy drain. Frank Ashley called to see if patient should have his drain changed out since his surgery was pushed back. They would like to change out the drain on 11/15/11 if you give the okay. Per radiologist in IR with a surgery date of 12/20/11 that is too long to leave same drain in place. Please advise. Call back number [phone].

## 2011-11-05 ENCOUNTER — Telehealth (INDEPENDENT_AMBULATORY_CARE_PROVIDER_SITE_OTHER): Payer: Self-pay

## 2011-11-05 ENCOUNTER — Other Ambulatory Visit (INDEPENDENT_AMBULATORY_CARE_PROVIDER_SITE_OTHER): Payer: Self-pay

## 2011-11-05 ENCOUNTER — Other Ambulatory Visit: Payer: Self-pay | Admitting: Oncology

## 2011-11-05 DIAGNOSIS — K8020 Calculus of gallbladder without cholecystitis without obstruction: Secondary | ICD-10-CM

## 2011-11-05 NOTE — Telephone Encounter (Signed)
Calling wanting to know if you are going to do an order in epic on the T-tube getting changed on 7/15 b/c it's been in there a while and the sx date is not till 12/20/11. They are just worried about the tube being in there so long if we don't exchange the tube. Pls call to advise.

## 2011-11-08 ENCOUNTER — Other Ambulatory Visit: Payer: Self-pay | Admitting: Radiology

## 2011-11-09 ENCOUNTER — Telehealth: Payer: Self-pay | Admitting: Family Medicine

## 2011-11-09 ENCOUNTER — Telehealth (HOSPITAL_COMMUNITY): Payer: Self-pay | Admitting: Surgery

## 2011-11-09 ENCOUNTER — Encounter: Payer: Self-pay | Admitting: Cardiology

## 2011-11-09 DIAGNOSIS — R9430 Abnormal result of cardiovascular function study, unspecified: Secondary | ICD-10-CM | POA: Insufficient documentation

## 2011-11-09 DIAGNOSIS — IMO0002 Reserved for concepts with insufficient information to code with codable children: Secondary | ICD-10-CM | POA: Insufficient documentation

## 2011-11-09 NOTE — Telephone Encounter (Signed)
Unsure of what test pt is referring to, and in Epic it appears he has OV today with Cardiologist Dr Myrtis Ser, so perhaps he will be able to answer his questions.  If not, Dr Caryl Never will return on Monday 7/15 and we can return his call at that time.

## 2011-11-09 NOTE — Telephone Encounter (Signed)
Pt called and is req test results from cardio visit last week. Pls call. Pt is aware that pcp is out of office.

## 2011-11-09 NOTE — Progress Notes (Unsigned)
   The patient has had an echo showing normal LV function with an ejection fraction of 60%. He's had a nuclear scan which is low risk. There is no significant ischemia. With these 2 studies normal, the patient is now cleared for his abdominal surgery.  Jerral Bonito, MD

## 2011-11-10 ENCOUNTER — Ambulatory Visit (HOSPITAL_COMMUNITY)
Admission: RE | Admit: 2011-11-10 | Discharge: 2011-11-10 | Disposition: A | Discharge status: Home / Self Care | Payer: Medicare Other | Source: Ambulatory Visit | Attending: Surgery | Admitting: Surgery

## 2011-11-10 DIAGNOSIS — K8020 Calculus of gallbladder without cholecystitis without obstruction: Secondary | ICD-10-CM | POA: Insufficient documentation

## 2011-11-10 DIAGNOSIS — Z434 Encounter for attention to other artificial openings of digestive tract: Secondary | ICD-10-CM | POA: Insufficient documentation

## 2011-11-10 MED ORDER — IOHEXOL 300 MG/ML  SOLN: 15.0000 mL | Freq: Once | INTRAMUSCULAR | Status: AC | PRN

## 2011-11-10 MED ORDER — LIDOCAINE HCL 1 % IJ SOLN
INTRAMUSCULAR | Status: AC
  Filled 2011-11-10: qty 20

## 2011-11-10 NOTE — Procedures (Signed)
Procedure:  Perc cholecystostomy tube change New 10 Fr catheter placed over wire into GB.  Left to gravity bag drainage.

## 2011-11-14 NOTE — Telephone Encounter (Signed)
He should consult with cardiology who ordered his tests

## 2011-11-15 ENCOUNTER — Other Ambulatory Visit (HOSPITAL_COMMUNITY): Payer: Medicare Other

## 2011-11-15 NOTE — Telephone Encounter (Signed)
I would like for him to follow up to discuss treatment options.  Alprazolam is very difficult to stop.

## 2011-11-15 NOTE — Telephone Encounter (Signed)
Pt informed. He wanted to remind Dr Caryl Never of a conversation they had about ordering more Alprazolam next time.  He is taking it TID prn, some days takes 2, there were a few days he needed 4.  Last refill was #30 with 0 refills in June 24.  Requesting refill with #90.

## 2011-11-15 NOTE — Telephone Encounter (Signed)
Pt stated he will not be returning for OV to discuss treatment options re: alprazolam because he is not going to pay for another appt when he reports he remembers discussing this same med at last OV.  Pt C/O calling for a refill every week because #30 is only lasting him a week or so with now with everything he has going on with his CA.  Pt became upset in his language saying things like "Dr Caryl Never needs to document and/or remember his conversations, I have been a patient of his for 30 years and have never abused a medicine"  Pt asked he be asked to reconsider as "the bills are rolling in, I really cannot afford another appt to discuss the same thing as before"

## 2011-11-16 ENCOUNTER — Ambulatory Visit: Payer: Medicare Other | Admitting: Family Medicine

## 2011-11-17 ENCOUNTER — Telehealth: Payer: Self-pay | Admitting: Family Medicine

## 2011-11-17 MED ORDER — CLONAZEPAM 0.5 MG PO TABS: 0.5000 mg | ORAL_TABLET | Freq: Two times a day (BID) | ORAL | Status: DC

## 2011-11-17 NOTE — Telephone Encounter (Signed)
Left message on machine with patient.  I explained my concern regarding escalation of Xanax use and that this medication is very difficult to stop and has high abuse potential in ANYONE.  I would like to suggest trial of Clonazepam 0.5 mg one po bid which I think will help control his significant anxiety symptoms.  If patient agreeable to trial, go ahead and fill #60 with one refill.

## 2011-11-17 NOTE — Telephone Encounter (Signed)
See earlier phone note, new Rx called in, pt aware

## 2011-11-17 NOTE — Telephone Encounter (Signed)
Pt stated MD call him this morning and he is ok with whatever medication doc would like for him to try. [facility] [phone]

## 2011-11-17 NOTE — Telephone Encounter (Signed)
Pt informed and was agreeable to try new med, Rx called in

## 2011-11-26 ENCOUNTER — Telehealth: Payer: Self-pay | Admitting: Family Medicine

## 2011-11-26 NOTE — Telephone Encounter (Signed)
Caller: Erica/Patient; PCP: Evelena Peat; CB#: [phone];  Call regarding Has Upcoming Surgeries - Scheduled for 12/20/11 and Was Prescribed Clonazepam  .5 mgs PO TID FOR ANXIETY and Makes Him Feels very Nervous, Short of Breath and jittery. He does not want to take it. Wants to try ANOTHER MED FOR ANXIETY. PLEASE CHECK WITH DR. Caryl Never AND GIVE HIM A CALL BACK- USES [facility].

## 2011-11-26 NOTE — Telephone Encounter (Signed)
I called pt, he reports he tried 1/2 tab after the first day and it still had the same effect.  He agreed it would not be in his best interest to ask another provider to Rx an alternative and he is OK with waiting until Dr Caryl Never returns on Monday to address.

## 2011-11-28 NOTE — Telephone Encounter (Signed)
D/c Clonazepam and make sure both this med and Xanax are off his list.  Lorazepam 0.5 mg one po q 8 hours prn anxiety.  Disp #60 and I need to see him back after his surgery to reassess.  Try to take as needed only.

## 2011-11-29 MED ORDER — LORAZEPAM 0.5 MG PO TABS: 0.5000 mg | ORAL_TABLET | Freq: Three times a day (TID) | ORAL | Status: DC | PRN

## 2011-11-29 NOTE — Telephone Encounter (Signed)
Rx called in, pt informed, clonazepam and xanax removed from med list. He has already D/C the clonazepam. Pt surgery is August 19th. Pt thanks you. FYI only

## 2011-11-30 ENCOUNTER — Telehealth (INDEPENDENT_AMBULATORY_CARE_PROVIDER_SITE_OTHER): Payer: Self-pay | Admitting: General Surgery

## 2011-11-30 NOTE — Telephone Encounter (Signed)
Pt calling to request refill of Dilaudid to bridge to his surgery on 12/20/11.  Dr. Luisa Hart in office today and wrote Rx for Dilaudid 2 mg, # 30 1-2 po Q4H prn pain, no refill.  Pt aware it is ready for pick up at front desk.

## 2011-12-07 ENCOUNTER — Encounter (HOSPITAL_COMMUNITY): Payer: Self-pay | Admitting: Pharmacy Technician

## 2011-12-14 ENCOUNTER — Encounter (HOSPITAL_COMMUNITY)
Admission: RE | Admit: 2011-12-14 | Discharge: 2011-12-14 | Disposition: A | Discharge status: Home / Self Care | Payer: Medicare Other | Source: Ambulatory Visit | Attending: Surgery | Admitting: Surgery

## 2011-12-14 ENCOUNTER — Encounter (HOSPITAL_COMMUNITY): Payer: Self-pay

## 2011-12-14 ENCOUNTER — Ambulatory Visit (HOSPITAL_COMMUNITY)
Admission: RE | Admit: 2011-12-14 | Discharge: 2011-12-14 | Disposition: A | Discharge status: Home / Self Care | Payer: Medicare Other | Source: Ambulatory Visit | Attending: Surgery | Admitting: Surgery

## 2011-12-14 DIAGNOSIS — Z01818 Encounter for other preprocedural examination: Secondary | ICD-10-CM | POA: Insufficient documentation

## 2011-12-14 DIAGNOSIS — Z01812 Encounter for preprocedural laboratory examination: Secondary | ICD-10-CM | POA: Insufficient documentation

## 2011-12-14 HISTORY — DX: Malignant (primary) neoplasm, unspecified: C80.1

## 2011-12-14 HISTORY — DX: Anxiety disorder, unspecified: F41.9

## 2011-12-14 LAB — DIFFERENTIAL
Basophils Relative: 1 % (ref 0–1)
Monocytes Absolute: 0.7 10*3/uL (ref 0.1–1.0)
Monocytes Relative: 11 % (ref 3–12)
Neutro Abs: 3.2 10*3/uL (ref 1.7–7.7)

## 2011-12-14 LAB — COMPREHENSIVE METABOLIC PANEL
AST: 15 U/L (ref 0–37)
Albumin: 3.5 g/dL (ref 3.5–5.2)
Alkaline Phosphatase: 47 U/L (ref 39–117)
Chloride: 110 mEq/L (ref 96–112)
Creatinine, Ser: 1.75 mg/dL — ABNORMAL HIGH (ref 0.50–1.35)
Potassium: 4.7 mEq/L (ref 3.5–5.1)
Total Bilirubin: 0.2 mg/dL — ABNORMAL LOW (ref 0.3–1.2)

## 2011-12-14 LAB — CBC
Platelets: 254 10*3/uL (ref 150–400)
RDW: 16.9 % — ABNORMAL HIGH (ref 11.5–15.5)
WBC: 7 10*3/uL (ref 4.0–10.5)

## 2011-12-14 MED ORDER — CHLORHEXIDINE GLUCONATE 4 % EX LIQD: 1.0000 "application " | Freq: Once | CUTANEOUS | Status: DC

## 2011-12-14 NOTE — Pre-Procedure Instructions (Signed)
PT HAS EKG AND CARDIOLOGY OFFICE NOTE 10/28/11, NUCLEAR STRESS TEST REPORT 11/01/11, AND ECH0 REPORT FROM 11/02/11--ALL FROM DR. KATZ AND [facility] AND ALL REPORTS IN EPIC. CXR, CBC, DIFF, CMET AND T/S WERE DONE TODAY -PREOP - AT [facility]. PREOP INSTRUCTIONS DISCUSSED WITH PT USING TEACH BACK METHOD. NOTE SENT TO DR. Rosezena Sensor EPIC IN BOX TO REVIEW PT'S PREOP BUN, CREATININE REPORTS.

## 2011-12-14 NOTE — Patient Instructions (Signed)
YOUR SURGERY IS SCHEDULED ON:  Monday  9/19  AT 7:30 AM  REPORT TO [place] SHORT STAY CENTER AT:  5:30 AM      PHONE # FOR SHORT STAY IS [phone]  DO NOT EAT OR DRINK ANYTHING AFTER MIDNIGHT THE NIGHT BEFORE YOUR SURGERY.  YOU MAY BRUSH YOUR TEETH, RINSE OUT YOUR MOUTH--BUT NO WATER, NO FOOD, NO CHEWING GUM, NO MINTS, NO CANDIES, NO CHEWING TOBACCO.  PLEASE TAKE THE FOLLOWING MEDICATIONS THE AM OF YOUR SURGERY WITH A FEW SIPS OF WATER:  ALPRAZOLAM, GABAPENTIN, PRILOSEC    IF YOU USE INHALERS--USE YOUR INHALERS THE AM OF YOUR SURGERY AND BRING INHALERS TO THE HOSPITAL -TAKE TO SURGERY.    IF YOU ARE DIABETIC:  DO NOT TAKE ANY DIABETIC MEDICATIONS THE AM OF YOUR SURGERY.  IF YOU TAKE INSULIN IN THE EVENINGS--PLEASE ONLY TAKE 1/2 NORMAL EVENING DOSE THE NIGHT BEFORE YOUR SURGERY.  NO INSULIN THE AM OF YOUR SURGERY.  IF YOU HAVE SLEEP APNEA AND USE CPAP OR BIPAP--PLEASE BRING THE MASK --NOT THE MACHINE-NOT THE TUBING   -JUST THE MASK. DO NOT BRING VALUABLES, MONEY, CREDIT CARDS.  CONTACT LENS, DENTURES / PARTIALS, GLASSES SHOULD NOT BE WORN TO SURGERY AND IN MOST CASES-HEARING AIDS WILL NEED TO BE REMOVED.  BRING YOUR GLASSES CASE, ANY EQUIPMENT NEEDED FOR YOUR CONTACT LENS. FOR PATIENTS ADMITTED TO THE HOSPITAL--CHECK OUT TIME THE DAY OF DISCHARGE IS 11:00 AM.  ALL INPATIENT ROOMS ARE PRIVATE - WITH BATHROOM, TELEPHONE, TELEVISION AND WIFI INTERNET. IF YOU ARE BEING DISCHARGED THE SAME DAY OF YOUR SURGERY--YOU CAN NOT DRIVE YOURSELF HOME--AND SHOULD NOT GO HOME ALONE BY TAXI OR BUS.  NO DRIVING OR OPERATING MACHINERY FOR 24 HOURS FOLLOWING ANESTHESIA / PAIN MEDICATIONS.                            SPECIAL INSTRUCTIONS:  CHLORHEXIDINE SOAP SHOWER (other brand names are Betasept and Hibiclens ) PLEASE SHOWER WITH CHLORHEXIDINE THE NIGHT BEFORE YOUR SURGERY AND THE AM OF YOUR SURGERY. DO NOT USE CHLORHEXIDINE ON YOUR FACE OR PRIVATE AREAS--YOU MAY USE YOUR NORMAL SOAP THOSE AREAS AND YOUR  NORMAL SHAMPOO.  WOMEN SHOULD AVOID SHAVING UNDER ARMS AND SHAVING LEGS 48 HOURS BEFORE USING CHLORHEXIDINE TO AVOID SKIN IRRITATION.  DO NOT USE IF ALLERGIC TO CHLORHEXIDINE.  PLEASE READ OVER ANY  FACT SHEETS THAT YOU WERE GIVEN: MRSA INFORMATION, BLOOD TRANSFUSION INFORMATION

## 2011-12-14 NOTE — Progress Notes (Signed)
DR. Luisa Hart - PLEASE REVIEW PT'S PREOP CMET REPORT IN EPIC-BUN  AND CREAT

## 2011-12-15 NOTE — Pre-Procedure Instructions (Signed)
DR. Luisa Hart REVIEWED PT'S PREOP CMET REPORT AND OK FOR SURGERY.

## 2011-12-19 NOTE — Anesthesia Preprocedure Evaluation (Addendum)
Anesthesia Evaluation  Patient identified by MRN, date of birth, ID band Patient awake    Reviewed: Allergy & Precautions, H&P , NPO status , Patient's Chart, lab work & pertinent test results  Airway Mallampati: III TM Distance: >3 FB Neck ROM: full    Dental  (+) Dental Advisory Given, Caps and Teeth Intact,    Pulmonary sleep apnea and Continuous Positive Airway Pressure Ventilation ,  breath sounds clear to auscultation  Pulmonary exam normal       Cardiovascular Exercise Tolerance: Good hypertension, Pt. on medications + CAD and + Cardiac Stents Rhythm:regular Rate:Normal  Stent 1997. OK now   Neuro/Psych Anxiety Peripheral neuropathy  Neuromuscular disease negative neurological ROS  negative psych ROS   GI/Hepatic Neg liver ROS, GERD-  Medicated and Controlled,Acute pancreatitis.   Endo/Other  Diabetes mellitus-, Poorly Controlled, Type 2, Insulin DependentMorbid obesity  Renal/GU Renal disease  negative genitourinary   Musculoskeletal   Abdominal   Peds  Hematology negative hematology ROS (+)   Anesthesia Other Findings Last intubation Grade 3 view over a bougie  Reproductive/Obstetrics negative OB ROS                          Anesthesia Physical Anesthesia Plan  ASA: III  Anesthesia Plan: General   Post-op Pain Management:    Induction: Intravenous  Airway Management Planned: Oral ETT  Additional Equipment:   Intra-op Plan:   Post-operative Plan: Extubation in OR  Informed Consent: I have reviewed the patients History and Physical, chart, labs and discussed the procedure including the risks, benefits and alternatives for the proposed anesthesia with the patient or authorized representative who has indicated his/her understanding and acceptance.   Dental advisory given  Plan Discussed with: CRNA  Anesthesia Plan Comments:         Anesthesia Quick Evaluation

## 2011-12-20 ENCOUNTER — Ambulatory Visit (HOSPITAL_COMMUNITY): Payer: Medicare Other

## 2011-12-20 ENCOUNTER — Ambulatory Visit (HOSPITAL_COMMUNITY): Payer: Medicare Other | Admitting: Anesthesiology

## 2011-12-20 ENCOUNTER — Encounter (HOSPITAL_COMMUNITY): Payer: Self-pay | Admitting: Anesthesiology

## 2011-12-20 ENCOUNTER — Encounter (HOSPITAL_COMMUNITY)
Admission: RE | Disposition: A | Discharge status: Home Health | Payer: Self-pay | Source: Ambulatory Visit | Attending: Surgery

## 2011-12-20 ENCOUNTER — Inpatient Hospital Stay (HOSPITAL_COMMUNITY)
Admission: RE | Admit: 2011-12-20 | Discharge: 2012-01-05 | DRG: 411 | Disposition: A | Discharge status: Home Health | Payer: Medicare Other | Source: Ambulatory Visit | Attending: Surgery | Admitting: Surgery

## 2011-12-20 ENCOUNTER — Encounter (HOSPITAL_COMMUNITY): Payer: Self-pay | Admitting: *Deleted

## 2011-12-20 ENCOUNTER — Inpatient Hospital Stay (HOSPITAL_COMMUNITY): Payer: Medicare Other

## 2011-12-20 DIAGNOSIS — K219 Gastro-esophageal reflux disease without esophagitis: Secondary | ICD-10-CM | POA: Diagnosis present

## 2011-12-20 DIAGNOSIS — K8064 Calculus of gallbladder and bile duct with chronic cholecystitis without obstruction: Principal | ICD-10-CM | POA: Diagnosis present

## 2011-12-20 DIAGNOSIS — N189 Chronic kidney disease, unspecified: Secondary | ICD-10-CM | POA: Diagnosis present

## 2011-12-20 DIAGNOSIS — Z8711 Personal history of peptic ulcer disease: Secondary | ICD-10-CM

## 2011-12-20 DIAGNOSIS — G609 Hereditary and idiopathic neuropathy, unspecified: Secondary | ICD-10-CM

## 2011-12-20 DIAGNOSIS — K8050 Calculus of bile duct without cholangitis or cholecystitis without obstruction: Secondary | ICD-10-CM | POA: Diagnosis present

## 2011-12-20 DIAGNOSIS — K811 Chronic cholecystitis: Secondary | ICD-10-CM | POA: Diagnosis present

## 2011-12-20 DIAGNOSIS — T85520A Displacement of bile duct prosthesis, initial encounter: Secondary | ICD-10-CM

## 2011-12-20 DIAGNOSIS — K8060 Calculus of gallbladder and bile duct with cholecystitis, unspecified, without obstruction: Principal | ICD-10-CM | POA: Diagnosis present

## 2011-12-20 DIAGNOSIS — Z794 Long term (current) use of insulin: Secondary | ICD-10-CM

## 2011-12-20 DIAGNOSIS — N179 Acute kidney failure, unspecified: Secondary | ICD-10-CM

## 2011-12-20 DIAGNOSIS — E43 Unspecified severe protein-calorie malnutrition: Secondary | ICD-10-CM | POA: Diagnosis present

## 2011-12-20 DIAGNOSIS — K560 Paralytic ileus: Secondary | ICD-10-CM | POA: Diagnosis not present

## 2011-12-20 DIAGNOSIS — G4733 Obstructive sleep apnea (adult) (pediatric): Secondary | ICD-10-CM | POA: Diagnosis present

## 2011-12-20 DIAGNOSIS — D62 Acute posthemorrhagic anemia: Secondary | ICD-10-CM | POA: Diagnosis not present

## 2011-12-20 DIAGNOSIS — G4730 Sleep apnea, unspecified: Secondary | ICD-10-CM

## 2011-12-20 DIAGNOSIS — I129 Hypertensive chronic kidney disease with stage 1 through stage 4 chronic kidney disease, or unspecified chronic kidney disease: Secondary | ICD-10-CM | POA: Diagnosis present

## 2011-12-20 DIAGNOSIS — A498 Other bacterial infections of unspecified site: Secondary | ICD-10-CM | POA: Diagnosis present

## 2011-12-20 DIAGNOSIS — K5900 Constipation, unspecified: Secondary | ICD-10-CM | POA: Diagnosis not present

## 2011-12-20 DIAGNOSIS — R9430 Abnormal result of cardiovascular function study, unspecified: Secondary | ICD-10-CM

## 2011-12-20 DIAGNOSIS — E1149 Type 2 diabetes mellitus with other diabetic neurological complication: Secondary | ICD-10-CM | POA: Diagnosis present

## 2011-12-20 DIAGNOSIS — A4902 Methicillin resistant Staphylococcus aureus infection, unspecified site: Secondary | ICD-10-CM

## 2011-12-20 DIAGNOSIS — E785 Hyperlipidemia, unspecified: Secondary | ICD-10-CM | POA: Diagnosis present

## 2011-12-20 DIAGNOSIS — I10 Essential (primary) hypertension: Secondary | ICD-10-CM

## 2011-12-20 DIAGNOSIS — L97509 Non-pressure chronic ulcer of other part of unspecified foot with unspecified severity: Secondary | ICD-10-CM | POA: Diagnosis present

## 2011-12-20 DIAGNOSIS — Z79899 Other long term (current) drug therapy: Secondary | ICD-10-CM

## 2011-12-20 DIAGNOSIS — E119 Type 2 diabetes mellitus without complications: Secondary | ICD-10-CM

## 2011-12-20 DIAGNOSIS — K8010 Calculus of gallbladder with chronic cholecystitis without obstruction: Secondary | ICD-10-CM

## 2011-12-20 DIAGNOSIS — R7881 Bacteremia: Secondary | ICD-10-CM

## 2011-12-20 DIAGNOSIS — I2510 Atherosclerotic heart disease of native coronary artery without angina pectoris: Secondary | ICD-10-CM

## 2011-12-20 DIAGNOSIS — K8309 Other cholangitis: Secondary | ICD-10-CM | POA: Diagnosis present

## 2011-12-20 DIAGNOSIS — Z6841 Body Mass Index (BMI) 40.0 and over, adult: Secondary | ICD-10-CM

## 2011-12-20 DIAGNOSIS — K659 Peritonitis, unspecified: Secondary | ICD-10-CM | POA: Diagnosis not present

## 2011-12-20 DIAGNOSIS — E1169 Type 2 diabetes mellitus with other specified complication: Secondary | ICD-10-CM | POA: Diagnosis present

## 2011-12-20 DIAGNOSIS — T85898A Other specified complication of other internal prosthetic devices, implants and grafts, initial encounter: Secondary | ICD-10-CM | POA: Diagnosis not present

## 2011-12-20 DIAGNOSIS — M25519 Pain in unspecified shoulder: Secondary | ICD-10-CM | POA: Diagnosis not present

## 2011-12-20 DIAGNOSIS — E663 Overweight: Secondary | ICD-10-CM | POA: Diagnosis present

## 2011-12-20 DIAGNOSIS — J9600 Acute respiratory failure, unspecified whether with hypoxia or hypercapnia: Secondary | ICD-10-CM

## 2011-12-20 DIAGNOSIS — C649 Malignant neoplasm of unspecified kidney, except renal pelvis: Secondary | ICD-10-CM | POA: Diagnosis present

## 2011-12-20 DIAGNOSIS — J95821 Acute postprocedural respiratory failure: Secondary | ICD-10-CM | POA: Diagnosis not present

## 2011-12-20 DIAGNOSIS — Y836 Removal of other organ (partial) (total) as the cause of abnormal reaction of the patient, or of later complication, without mention of misadventure at the time of the procedure: Secondary | ICD-10-CM | POA: Diagnosis not present

## 2011-12-20 DIAGNOSIS — Z8614 Personal history of Methicillin resistant Staphylococcus aureus infection: Secondary | ICD-10-CM

## 2011-12-20 DIAGNOSIS — E1142 Type 2 diabetes mellitus with diabetic polyneuropathy: Secondary | ICD-10-CM | POA: Diagnosis present

## 2011-12-20 DIAGNOSIS — N2889 Other specified disorders of kidney and ureter: Secondary | ICD-10-CM | POA: Diagnosis present

## 2011-12-20 DIAGNOSIS — J309 Allergic rhinitis, unspecified: Secondary | ICD-10-CM

## 2011-12-20 HISTORY — PX: INTRAOPERATIVE CHOLANGIOGRAM: SHX5230

## 2011-12-20 HISTORY — PX: PARTIAL NEPHRECTOMY: SHX414

## 2011-12-20 HISTORY — DX: Acute pancreatitis without necrosis or infection, unspecified: K85.90

## 2011-12-20 HISTORY — PX: CHOLECYSTECTOMY: SHX55

## 2011-12-20 LAB — POCT I-STAT 4, (NA,K, GLUC, HGB,HCT)
Glucose, Bld: 156 mg/dL — ABNORMAL HIGH (ref 70–99)
HCT: 29 % — ABNORMAL LOW (ref 39.0–52.0)
Hemoglobin: 8.8 g/dL — ABNORMAL LOW (ref 13.0–17.0)
Hemoglobin: 9.9 g/dL — ABNORMAL LOW (ref 13.0–17.0)
Potassium: 4.2 mEq/L (ref 3.5–5.1)
Sodium: 143 mEq/L (ref 135–145)

## 2011-12-20 LAB — BLOOD GAS, ARTERIAL
Acid-base deficit: 9.7 mmol/L — ABNORMAL HIGH (ref 0.0–2.0)
Bicarbonate: 18.9 mEq/L — ABNORMAL LOW (ref 20.0–24.0)
Delivery systems: POSITIVE
Inspiratory PAP: 16
MECHVT: 640 mL
O2 Saturation: 98.7 %
O2 Saturation: 98.4 %
PEEP: 5 cmH2O
Patient temperature: 36.6
Patient temperature: 39.2
Patient temperature: 98.6
Pressure support: 8 cmH2O
RATE: 12 resp/min
RATE: 14 resp/min
TCO2: 14.8 mmol/L (ref 0–100)
TCO2: 19 mmol/L (ref 0–100)
pCO2 arterial: 32.7 mmHg — ABNORMAL LOW (ref 35.0–45.0)
pCO2 arterial: 41.4 mmHg (ref 35.0–45.0)
pH, Arterial: 7.206 — ABNORMAL LOW (ref 7.350–7.450)

## 2011-12-20 LAB — GLUCOSE, CAPILLARY: Glucose-Capillary: 123 mg/dL — ABNORMAL HIGH (ref 70–99)

## 2011-12-20 LAB — CBC
MCH: 25.5 pg — ABNORMAL LOW (ref 26.0–34.0)
MCHC: 32.1 g/dL (ref 30.0–36.0)
Platelets: 180 10*3/uL (ref 150–400)
RDW: 17.1 % — ABNORMAL HIGH (ref 11.5–15.5)

## 2011-12-20 LAB — POCT I-STAT 7, (LYTES, BLD GAS, ICA,H+H)
Acid-base deficit: 11 mmol/L — ABNORMAL HIGH (ref 0.0–2.0)
Calcium, Ion: 1.02 mmol/L — ABNORMAL LOW (ref 1.13–1.30)
HCT: 28 % — ABNORMAL LOW (ref 39.0–52.0)
O2 Saturation: 100 %
Sodium: 139 mEq/L (ref 135–145)
pO2, Arterial: 204 mmHg — ABNORMAL HIGH (ref 80.0–100.0)

## 2011-12-20 LAB — CREATININE, SERUM: Creatinine, Ser: 1.74 mg/dL — ABNORMAL HIGH (ref 0.50–1.35)

## 2011-12-20 SURGERY — CHOLECYSTECTOMY WITH COMMON DUCT EXPLORATION
Anesthesia: General | Laterality: Right | Wound class: Contaminated

## 2011-12-20 MED ORDER — LORAZEPAM 2 MG/ML IJ SOLN: 1.0000 mg | INTRAMUSCULAR | Status: DC | PRN

## 2011-12-20 MED ORDER — NALOXONE HCL 0.4 MG/ML IJ SOLN: 0.4000 mg | INTRAMUSCULAR | Status: DC | PRN

## 2011-12-20 MED ORDER — PROPOFOL 10 MG/ML IV EMUL
INTRAVENOUS | Status: DC | PRN
  Administered 2011-12-20: 250 mg via INTRAVENOUS

## 2011-12-20 MED ORDER — DIPHENHYDRAMINE HCL 12.5 MG/5ML PO ELIX: 12.5000 mg | ORAL_SOLUTION | Freq: Four times a day (QID) | ORAL | Status: DC | PRN

## 2011-12-20 MED ORDER — LORAZEPAM 2 MG/ML IJ SOLN: 1.0000 mg | Freq: Two times a day (BID) | INTRAMUSCULAR | Status: DC | PRN

## 2011-12-20 MED ORDER — ACETAMINOPHEN 10 MG/ML IV SOLN
INTRAVENOUS | Status: AC
  Filled 2011-12-20: qty 100

## 2011-12-20 MED ORDER — SODIUM CHLORIDE 0.9 % IJ SOLN: 9.0000 mL | INTRAMUSCULAR | Status: DC | PRN

## 2011-12-20 MED ORDER — ONDANSETRON HCL 4 MG/2ML IJ SOLN
4.0000 mg | Freq: Four times a day (QID) | INTRAMUSCULAR | Status: DC | PRN
  Administered 2011-12-23 – 2011-12-24 (×2): 4 mg via INTRAVENOUS
  Filled 2011-12-20 (×2): qty 2

## 2011-12-20 MED ORDER — SODIUM CHLORIDE 0.9 % IR SOLN
Status: DC | PRN
  Administered 2011-12-20: 3000 mL

## 2011-12-20 MED ORDER — PHENYLEPHRINE HCL 10 MG/ML IJ SOLN
10.0000 mg | INTRAVENOUS | Status: DC | PRN
  Administered 2011-12-20: 20 ug/min via INTRAVENOUS

## 2011-12-20 MED ORDER — PROMETHAZINE HCL 25 MG/ML IJ SOLN: 6.2500 mg | INTRAMUSCULAR | Status: DC | PRN

## 2011-12-20 MED ORDER — IOHEXOL 300 MG/ML  SOLN
INTRAMUSCULAR | Status: AC
  Filled 2011-12-20: qty 1

## 2011-12-20 MED ORDER — NEOSTIGMINE METHYLSULFATE 1 MG/ML IJ SOLN
INTRAMUSCULAR | Status: DC | PRN
  Administered 2011-12-20: 5 mg via INTRAVENOUS

## 2011-12-20 MED ORDER — FENTANYL CITRATE 0.05 MG/ML IJ SOLN
INTRAMUSCULAR | Status: AC
  Filled 2011-12-20: qty 2

## 2011-12-20 MED ORDER — LIDOCAINE HCL (CARDIAC) 20 MG/ML IV SOLN
INTRAVENOUS | Status: DC | PRN
  Administered 2011-12-20: 50 mg via INTRAVENOUS

## 2011-12-20 MED ORDER — BUPIVACAINE-EPINEPHRINE PF 0.25-1:200000 % IJ SOLN
INTRAMUSCULAR | Status: AC
  Filled 2011-12-20: qty 30

## 2011-12-20 MED ORDER — 0.9 % SODIUM CHLORIDE (POUR BTL) OPTIME
TOPICAL | Status: DC | PRN
  Administered 2011-12-20: 1000 mL

## 2011-12-20 MED ORDER — CIPROFLOXACIN IN D5W 400 MG/200ML IV SOLN
400.0000 mg | INTRAVENOUS | Status: AC
  Administered 2011-12-20: 400 mg via INTRAVENOUS

## 2011-12-20 MED ORDER — ENOXAPARIN SODIUM 30 MG/0.3ML ~~LOC~~ SOLN
30.0000 mg | SUBCUTANEOUS | Status: DC
  Administered 2011-12-21 – 2011-12-22 (×2): 30 mg via SUBCUTANEOUS
  Filled 2011-12-20 (×4): qty 0.3

## 2011-12-20 MED ORDER — CIPROFLOXACIN IN D5W 400 MG/200ML IV SOLN
INTRAVENOUS | Status: AC
  Filled 2011-12-20: qty 200

## 2011-12-20 MED ORDER — SODIUM CHLORIDE 0.9 % IV BOLUS (SEPSIS)
1000.0000 mL | Freq: Once | INTRAVENOUS | Status: AC
  Administered 2011-12-20: 1000 mL via INTRAVENOUS

## 2011-12-20 MED ORDER — ONDANSETRON HCL 4 MG PO TABS: 4.0000 mg | ORAL_TABLET | Freq: Four times a day (QID) | ORAL | Status: DC | PRN

## 2011-12-20 MED ORDER — MANNITOL 25 % IV SOLN
INTRAVENOUS | Status: DC | PRN
  Administered 2011-12-20: 25 g via INTRAVENOUS
  Administered 2011-12-20: 12.5 g via INTRAVENOUS

## 2011-12-20 MED ORDER — HYDROMORPHONE 0.3 MG/ML IV SOLN: INTRAVENOUS | Status: DC

## 2011-12-20 MED ORDER — FENTANYL CITRATE 0.05 MG/ML IJ SOLN: 25.0000 ug | INTRAMUSCULAR | Status: DC | PRN

## 2011-12-20 MED ORDER — HETASTARCH-ELECTROLYTES 6 % IV SOLN
INTRAVENOUS | Status: DC | PRN
  Administered 2011-12-20 (×2): via INTRAVENOUS

## 2011-12-20 MED ORDER — SODIUM CHLORIDE 0.9 % IV SOLN
INTRAVENOUS | Status: DC | PRN
  Administered 2011-12-20 (×4): via INTRAVENOUS

## 2011-12-20 MED ORDER — PROPOFOL 10 MG/ML IV BOLUS
INTRAVENOUS | Status: DC | PRN
  Administered 2011-12-20: 130 mg via INTRAVENOUS

## 2011-12-20 MED ORDER — SUFENTANIL CITRATE 50 MCG/ML IV SOLN
INTRAVENOUS | Status: DC | PRN
  Administered 2011-12-20 (×4): 5 ug via INTRAVENOUS
  Administered 2011-12-20 (×2): 10 ug via INTRAVENOUS
  Administered 2011-12-20: 5 ug via INTRAVENOUS

## 2011-12-20 MED ORDER — DIPHENHYDRAMINE HCL 50 MG/ML IJ SOLN: 12.5000 mg | Freq: Four times a day (QID) | INTRAMUSCULAR | Status: DC | PRN

## 2011-12-20 MED ORDER — ONDANSETRON HCL 4 MG/2ML IJ SOLN: 4.0000 mg | Freq: Four times a day (QID) | INTRAMUSCULAR | Status: DC | PRN

## 2011-12-20 MED ORDER — ONDANSETRON HCL 4 MG/2ML IJ SOLN
INTRAMUSCULAR | Status: DC | PRN
  Administered 2011-12-20: 4 mg via INTRAVENOUS

## 2011-12-20 MED ORDER — CEFAZOLIN SODIUM 1-5 GM-% IV SOLN
INTRAVENOUS | Status: DC | PRN
  Administered 2011-12-20: 3 g via INTRAVENOUS

## 2011-12-20 MED ORDER — LABETALOL HCL 5 MG/ML IV SOLN
5.0000 mg | INTRAVENOUS | Status: DC | PRN
  Administered 2011-12-20 (×2): 5 mg via INTRAVENOUS

## 2011-12-20 MED ORDER — MANNITOL 25 % IV SOLN
25.0000 g | Freq: Once | INTRAVENOUS | Status: DC
  Filled 2011-12-20 (×2): qty 100

## 2011-12-20 MED ORDER — FENTANYL BOLUS VIA INFUSION
50.0000 ug | Freq: Four times a day (QID) | INTRAVENOUS | Status: DC | PRN
  Filled 2011-12-20: qty 100

## 2011-12-20 MED ORDER — TISSEEL VH 10 ML EX KIT
PACK | CUTANEOUS | Status: AC
  Filled 2011-12-20: qty 1

## 2011-12-20 MED ORDER — SUCCINYLCHOLINE CHLORIDE 20 MG/ML IJ SOLN
INTRAMUSCULAR | Status: DC | PRN
  Administered 2011-12-20: 100 mg via INTRAVENOUS

## 2011-12-20 MED ORDER — ACETAMINOPHEN 10 MG/ML IV SOLN
INTRAVENOUS | Status: DC | PRN
  Administered 2011-12-20: 1000 mg via INTRAVENOUS

## 2011-12-20 MED ORDER — SODIUM CHLORIDE 0.9 % IV SOLN
INTRAVENOUS | Status: DC | PRN
  Administered 2011-12-20 (×6): via INTRAVENOUS

## 2011-12-20 MED ORDER — PANTOPRAZOLE SODIUM 40 MG IV SOLR
40.0000 mg | INTRAVENOUS | Status: DC
  Administered 2011-12-20 – 2011-12-21 (×2): 40 mg via INTRAVENOUS
  Filled 2011-12-20 (×3): qty 40

## 2011-12-20 MED ORDER — CEFAZOLIN SODIUM 1-5 GM-% IV SOLN
INTRAVENOUS | Status: AC
  Filled 2011-12-20: qty 50

## 2011-12-20 MED ORDER — SODIUM CHLORIDE 0.9 % IJ SOLN
INTRAMUSCULAR | Status: DC | PRN
  Administered 2011-12-20: 50 mL

## 2011-12-20 MED ORDER — HYDROMORPHONE HCL PF 1 MG/ML IJ SOLN
INTRAMUSCULAR | Status: DC | PRN
  Administered 2011-12-20 (×4): 1 mg via INTRAVENOUS

## 2011-12-20 MED ORDER — KCL IN DEXTROSE-NACL 20-5-0.45 MEQ/L-%-% IV SOLN
INTRAVENOUS | Status: DC
  Administered 2011-12-20: 19:00:00 via INTRAVENOUS
  Administered 2011-12-21: 100 mL/h via INTRAVENOUS
  Filled 2011-12-20 (×3): qty 1000

## 2011-12-20 MED ORDER — CEFAZOLIN SODIUM-DEXTROSE 2-3 GM-% IV SOLR
INTRAVENOUS | Status: AC
  Filled 2011-12-20: qty 50

## 2011-12-20 MED ORDER — BUPIVACAINE LIPOSOME 1.3 % IJ SUSP
INTRAMUSCULAR | Status: DC | PRN
  Administered 2011-12-20: 20 mL

## 2011-12-20 MED ORDER — SODIUM CHLORIDE 0.9 % IJ SOLN
INTRAMUSCULAR | Status: DC | PRN
  Administered 2011-12-20: 10:00:00

## 2011-12-20 MED ORDER — BUPIVACAINE LIPOSOME 1.3 % IJ SUSP
20.0000 mL | Freq: Once | INTRAMUSCULAR | Status: DC
  Filled 2011-12-20: qty 20

## 2011-12-20 MED ORDER — HYDROMORPHONE HCL PF 1 MG/ML IJ SOLN: 1.0000 mg | INTRAMUSCULAR | Status: DC | PRN

## 2011-12-20 MED ORDER — SUCCINYLCHOLINE CHLORIDE 20 MG/ML IJ SOLN
INTRAMUSCULAR | Status: DC | PRN
  Administered 2011-12-20: 140 mg via INTRAVENOUS

## 2011-12-20 MED ORDER — EPHEDRINE SULFATE 50 MG/ML IJ SOLN
INTRAMUSCULAR | Status: DC | PRN
  Administered 2011-12-20: 10 mg via INTRAVENOUS

## 2011-12-20 MED ORDER — LACTATED RINGERS IV SOLN
INTRAVENOUS | Status: DC | PRN
  Administered 2011-12-20: 07:00:00 via INTRAVENOUS

## 2011-12-20 MED ORDER — MIDAZOLAM HCL 5 MG/5ML IJ SOLN
INTRAMUSCULAR | Status: DC | PRN
  Administered 2011-12-20 (×2): 2 mg via INTRAVENOUS

## 2011-12-20 MED ORDER — LORAZEPAM BOLUS VIA INFUSION: 1.0000 mg | INTRAVENOUS | Status: DC | PRN

## 2011-12-20 MED ORDER — GLYCOPYRROLATE 0.2 MG/ML IJ SOLN
INTRAMUSCULAR | Status: DC | PRN
  Administered 2011-12-20: .8 mg via INTRAVENOUS

## 2011-12-20 MED ORDER — LACTATED RINGERS IV SOLN: INTRAVENOUS | Status: DC

## 2011-12-20 MED ORDER — INSULIN ASPART 100 UNIT/ML ~~LOC~~ SOLN
0.0000 [IU] | SUBCUTANEOUS | Status: DC
  Administered 2011-12-20 (×2): 3 [IU] via SUBCUTANEOUS
  Administered 2011-12-21: 5 [IU] via SUBCUTANEOUS
  Administered 2011-12-21: 3 [IU] via SUBCUTANEOUS
  Administered 2011-12-21: 2 [IU] via SUBCUTANEOUS
  Administered 2011-12-21 (×2): 3 [IU] via SUBCUTANEOUS
  Administered 2011-12-21: 5 [IU] via SUBCUTANEOUS
  Administered 2011-12-22 (×5): 3 [IU] via SUBCUTANEOUS
  Administered 2011-12-23: 2 [IU] via SUBCUTANEOUS
  Administered 2011-12-23: 3 [IU] via SUBCUTANEOUS
  Administered 2011-12-23 (×2): 8 [IU] via SUBCUTANEOUS
  Administered 2011-12-23: 5 [IU] via SUBCUTANEOUS
  Administered 2011-12-23: 3 [IU] via SUBCUTANEOUS
  Administered 2011-12-24 – 2011-12-25 (×4): 2 [IU] via SUBCUTANEOUS
  Administered 2011-12-26: 3 [IU] via SUBCUTANEOUS
  Administered 2011-12-26: 2 [IU] via SUBCUTANEOUS
  Administered 2011-12-26: 3 [IU] via SUBCUTANEOUS
  Administered 2011-12-26: 2 [IU] via SUBCUTANEOUS
  Administered 2011-12-27: 3 [IU] via SUBCUTANEOUS
  Administered 2011-12-27 – 2011-12-28 (×4): 2 [IU] via SUBCUTANEOUS
  Administered 2011-12-29: 3 [IU] via SUBCUTANEOUS
  Administered 2011-12-29: 5 [IU] via SUBCUTANEOUS
  Administered 2011-12-29 (×2): 3 [IU] via SUBCUTANEOUS
  Administered 2011-12-29 (×2): 5 [IU] via SUBCUTANEOUS
  Administered 2011-12-30 (×2): 3 [IU] via SUBCUTANEOUS
  Administered 2011-12-30: 5 [IU] via SUBCUTANEOUS
  Administered 2011-12-30: 3 [IU] via SUBCUTANEOUS
  Administered 2011-12-30: 2 [IU] via SUBCUTANEOUS
  Administered 2011-12-30 – 2011-12-31 (×2): 5 [IU] via SUBCUTANEOUS
  Administered 2011-12-31 (×2): 2 [IU] via SUBCUTANEOUS

## 2011-12-20 MED ORDER — ROCURONIUM BROMIDE 100 MG/10ML IV SOLN
INTRAVENOUS | Status: DC | PRN
  Administered 2011-12-20: 20 mg via INTRAVENOUS
  Administered 2011-12-20: 10 mg via INTRAVENOUS
  Administered 2011-12-20 (×2): 20 mg via INTRAVENOUS
  Administered 2011-12-20: 60 mg via INTRAVENOUS

## 2011-12-20 MED ORDER — SODIUM CHLORIDE 0.9 % IV SOLN
50.0000 ug/h | INTRAVENOUS | Status: DC
  Administered 2011-12-20: 100 ug/h via INTRAVENOUS
  Filled 2011-12-20: qty 50

## 2011-12-20 MED ORDER — LABETALOL HCL 5 MG/ML IV SOLN
INTRAVENOUS | Status: AC
  Filled 2011-12-20: qty 4

## 2011-12-20 MED ORDER — PHENYLEPHRINE HCL 10 MG/ML IJ SOLN
INTRAMUSCULAR | Status: DC | PRN
  Administered 2011-12-20: 80 ug via INTRAVENOUS
  Administered 2011-12-20: 120 ug via INTRAVENOUS
  Administered 2011-12-20 (×2): 80 ug via INTRAVENOUS

## 2011-12-20 MED ORDER — LISINOPRIL 10 MG PO TABS: 10.0000 mg | ORAL_TABLET | Freq: Every day | ORAL | Status: DC

## 2011-12-20 SURGICAL SUPPLY — 112 items
APPLICATOR COTTON TIP 6IN STRL (MISCELLANEOUS) ×6 IMPLANT
ATTRACTOMAT 16X20 MAGNETIC DRP (DRAPES) ×3 IMPLANT
BAG BILE SMALL  12/CS (BAG) ×1 IMPLANT
BAG ISL DRAPE 18X18 STRL (DRAPES) ×3
BAG ISOLATION DRAPE 18X18 (DRAPES) ×3 IMPLANT
BLADE EXTENDED COATED 6.5IN (ELECTRODE) ×4 IMPLANT
BLADE HEX COATED 2.75 (ELECTRODE) ×4 IMPLANT
BLADE SURG SZ10 CARB STEEL (BLADE) ×4 IMPLANT
BLADE SURG SZ11 CARB STEEL (BLADE) ×1 IMPLANT
BLADE SURG SZ20 CARB STEEL (BLADE) ×3 IMPLANT
CANISTER SUCTION 2500CC (MISCELLANEOUS) ×7 IMPLANT
CATH EMB 3FR 40CM (CATHETERS) ×1 IMPLANT
CATH EMB 4FR 80CM (CATHETERS) ×1 IMPLANT
CATH REDDICK CHOLANGI 4FR 50CM (CATHETERS) ×1 IMPLANT
CATH T TUBE WHEL MOSS 12FR (CATHETERS) ×1 IMPLANT
CHLORAPREP W/TINT 26ML (MISCELLANEOUS) ×4 IMPLANT
CHOLANGIOGRAM CATH TAUT (CATHETERS) ×3 IMPLANT
CLIP LIGATING HEM O LOK PURPLE (MISCELLANEOUS) ×13 IMPLANT
CLIP LIGATING HEMO O LOK GREEN (MISCELLANEOUS) ×13 IMPLANT
CLIP SUT LAPRA TY ABSORB (SUTURE) ×6 IMPLANT
CLIP TI LARGE 6 (CLIP) ×2 IMPLANT
CLIP TI MEDIUM 6 (CLIP) ×2 IMPLANT
CLOTH BEACON ORANGE TIMEOUT ST (SAFETY) ×7 IMPLANT
COVER MAYO STAND STRL (DRAPES) ×4 IMPLANT
COVER SURGICAL LIGHT HANDLE (MISCELLANEOUS) ×3 IMPLANT
DECANTER SPIKE VIAL GLASS SM (MISCELLANEOUS) IMPLANT
DISSECTOR ROUND CHERRY 3/8 STR (MISCELLANEOUS) ×7 IMPLANT
DRAIN CHANNEL 15F RND FF 3/16 (WOUND CARE) ×4 IMPLANT
DRAIN CHANNEL RND F F (WOUND CARE) ×1 IMPLANT
DRAIN PENROSE 18X1/2 LTX STRL (DRAIN) IMPLANT
DRAPE C-ARM 42X72 X-RAY (DRAPES) ×4 IMPLANT
DRAPE INCISE IOBAN 66X45 STRL (DRAPES) ×3 IMPLANT
DRAPE ISOLATION BAG 18X18 (DRAPES) ×1
DRAPE LAPAROSCOPIC ABDOMINAL (DRAPES) ×4 IMPLANT
DRAPE LAPAROTOMY TRNSV 102X78 (DRAPE) IMPLANT
DRAPE LG THREE QUARTER DISP (DRAPES) IMPLANT
DRAPE SLUSH/WARMER DISC (DRAPES) ×4 IMPLANT
DRAPE TABLE BACK 44X90 PK DISP (DRAPES) ×3 IMPLANT
DRAPE UTILITY 15X26 (DRAPE) ×1 IMPLANT
DRAPE WARM FLUID 44X44 (DRAPE) ×7 IMPLANT
DRSG TEGADERM 8X12 (GAUZE/BANDAGES/DRESSINGS) ×1 IMPLANT
ELECT REM PT RETURN 9FT ADLT (ELECTROSURGICAL) ×8
ELECTRODE REM PT RTRN 9FT ADLT (ELECTROSURGICAL) ×9 IMPLANT
EVACUATOR DRAINAGE 7X20 100CC (MISCELLANEOUS) ×3 IMPLANT
EVACUATOR SILICONE 100CC (DRAIN) IMPLANT
EVACUATOR SILICONE 100CC (MISCELLANEOUS) ×8
GAUZE SPONGE 4X4 16PLY XRAY LF (GAUZE/BANDAGES/DRESSINGS) ×4 IMPLANT
GLOVE BIOGEL M STRL SZ7.5 (GLOVE) ×4 IMPLANT
GLOVE BIOGEL PI IND STRL 7.0 (GLOVE) ×3 IMPLANT
GLOVE BIOGEL PI INDICATOR 7.0 (GLOVE) ×1
GLOVE INDICATOR 8.0 STRL GRN (GLOVE) ×8 IMPLANT
GLOVE SS BIOGEL STRL SZ 8 (GLOVE) ×3 IMPLANT
GLOVE SUPERSENSE BIOGEL SZ 8 (GLOVE) ×2
GOWN STRL NON-REIN LRG LVL3 (GOWN DISPOSABLE) ×3 IMPLANT
GOWN STRL REIN XL XLG (GOWN DISPOSABLE) ×10 IMPLANT
HAND ACTIVATED (MISCELLANEOUS) IMPLANT
HEMOSTAT SNOW SURGICEL 2X4 (HEMOSTASIS) ×2 IMPLANT
HEMOSTAT SURGICEL 4X8 (HEMOSTASIS) ×7 IMPLANT
KIT BASIN OR (CUSTOM PROCEDURE TRAY) ×7 IMPLANT
LOOP MINI RED (MISCELLANEOUS) ×1 IMPLANT
LOOP VESSEL MAXI BLUE (MISCELLANEOUS) ×3 IMPLANT
NS IRRIG 1000ML POUR BTL (IV SOLUTION) ×13 IMPLANT
PACK GENERAL/GYN (CUSTOM PROCEDURE TRAY) ×7 IMPLANT
POSITIONER SURGICAL ARM (MISCELLANEOUS) ×6 IMPLANT
SET CHOLANGIOGRAPH MIX (MISCELLANEOUS) ×4 IMPLANT
SET CYSTO W/LG BORE CLAMP LF (SET/KITS/TRAYS/PACK) ×1 IMPLANT
SPONGE DRAIN TRACH 4X4 STRL 2S (GAUZE/BANDAGES/DRESSINGS) ×3 IMPLANT
SPONGE GAUZE 4X4 12PLY (GAUZE/BANDAGES/DRESSINGS) ×9 IMPLANT
SPONGE LAP 18X18 X RAY DECT (DISPOSABLE) ×6 IMPLANT
SPONGE SURGIFOAM ABS GEL 100 (HEMOSTASIS) IMPLANT
STAPLER VISISTAT 35W (STAPLE) ×7 IMPLANT
SUCTION FRAZIER 12FR DISP (SUCTIONS) ×1 IMPLANT
SUCTION POOLE TIP (SUCTIONS) ×4 IMPLANT
SURGIFLO W/THROMBIN 8M KIT (HEMOSTASIS) ×4 IMPLANT
SUT ETHILON 3 0 PS 1 (SUTURE) ×2 IMPLANT
SUT MON AB 2-0 SH 27 (SUTURE) ×3 IMPLANT
SUT MON AB 2-0 SH27 (SUTURE) ×3 IMPLANT
SUT NOV 1 T60/GS (SUTURE) IMPLANT
SUT NOVA NAB DX-16 0-1 5-0 T12 (SUTURE) IMPLANT
SUT NOVA T20/GS 25 (SUTURE) ×6 IMPLANT
SUT PDS AB 1 CTX 36 (SUTURE) ×6 IMPLANT
SUT PDS AB 1 TP1 96 (SUTURE) ×9 IMPLANT
SUT PDS AB 3-0 SH 27 (SUTURE) ×8 IMPLANT
SUT SILK 0 (SUTURE)
SUT SILK 0 30XBRD TIE 6 (SUTURE) ×3 IMPLANT
SUT SILK 2 0 (SUTURE)
SUT SILK 2-0 18XBRD TIE 12 (SUTURE) ×3 IMPLANT
SUT SILK 2-0 30XBRD TIE 12 (SUTURE) ×3 IMPLANT
SUT SILK 3 0 (SUTURE)
SUT SILK 3 0 SH CR/8 (SUTURE) ×3 IMPLANT
SUT SILK 3-0 18XBRD TIE 12 (SUTURE) ×3 IMPLANT
SUT V-LOC BARB 180 2/0GR6 GS22 (SUTURE) ×4
SUT VIC AB 2-0 BRD 54 (SUTURE) ×1 IMPLANT
SUT VIC AB 2-0 SH 18 (SUTURE) ×1 IMPLANT
SUT VIC AB 2-0 SH 27 (SUTURE) ×4
SUT VIC AB 2-0 SH 27X BRD (SUTURE) ×12 IMPLANT
SUT VIC AB 3-0 SH 27 (SUTURE) ×4
SUT VIC AB 3-0 SH 27X BRD (SUTURE) ×3 IMPLANT
SUT VIC AB 3-0 SH 8-18 (SUTURE) ×1 IMPLANT
SUT VIC AB 4-0 RB1 27 (SUTURE)
SUT VIC AB 4-0 RB1 27XBRD (SUTURE) ×6 IMPLANT
SUT VLOC BARB 180 ABS3/0GR12 (SUTURE) ×12
SUTURE V-LC BRB 180 2/0GR6GS22 (SUTURE) IMPLANT
SUTURE VLOC BRB 180 ABS3/0GR12 (SUTURE) IMPLANT
SYR 20CC LL (SYRINGE) ×2 IMPLANT
SYRINGE 10CC LL (SYRINGE) ×3 IMPLANT
TAPE CLOTH SURG 4X10 WHT LF (GAUZE/BANDAGES/DRESSINGS) ×3 IMPLANT
TOWEL OR 17X26 10 PK STRL BLUE (TOWEL DISPOSABLE) ×9 IMPLANT
TRAY FOLEY CATH 14FRSI W/METER (CATHETERS) ×7 IMPLANT
TUBING CONNECTING 10 (TUBING) ×3 IMPLANT
WATER STERILE IRR 1500ML POUR (IV SOLUTION) ×3 IMPLANT
YANKAUER SUCT BULB TIP NO VENT (SUCTIONS) ×4 IMPLANT

## 2011-12-20 NOTE — Interval H&P Note (Signed)
History and Physical Interval Note:  12/20/2011 7:04 AM  Frank Ashley  has presented today for surgery, with the diagnosis of common bile duct stones  The various methods of treatment have been discussed with the patient and family. After consideration of risks, benefits and other options for treatment, the patient has consented to  Procedure(s) (LRB): CHOLECYSTECTOMY WITH COMMON DUCT EXPLORATION (N/A) NEPHRECTOMY PARTIAL (Right) as a surgical intervention .  The patient's history has been reviewed, patient examined, no change in status, stable for surgery.  I have reviewed the patient's chart and labs.  Questions were answered to the patient's satisfaction.     [name] A.

## 2011-12-20 NOTE — Anesthesia Postprocedure Evaluation (Signed)
  Anesthesia Post-op Note  Patient: Frank Ashley  Procedure(s) Performed: Procedure(s) (LRB): CHOLECYSTECTOMY WITH COMMON DUCT EXPLORATION (N/A) NEPHRECTOMY PARTIAL (Right) INTRAOPERATIVE CHOLANGIOGRAM ()  Patient Location: PACU  Anesthesia Type: General  Level of Consciousness: sedated, confused and responds to stimulation  Airway and Oxygen Therapy: Patient re-intubated and Patient placed on Ventilator (see vital sign flow sheet for setting)  Post-op Pain: mild  Post-op Assessment: RESPIRATORY FUNCTION UNSTABLE  Post-op Vital Signs: stable  Complications: Patient re-intubated

## 2011-12-20 NOTE — H&P (Signed)
Frank Ashley   MRN: [id number]   Description: 71 year old male  Provider: Dortha Schwalbe., MD  Department: [facility]        Diagnoses     Gall stones, common bile duct   - Primary    574.50      Reason for Visit     Routine Post Op    PO GB; reck drain        Vitals - Last Recorded       BP Pulse Temp Resp Ht Wt    128/72 76 98.2 F (36.8 C) (Temporal) 16 6\' 1"  (1.854 m) 276 lb 9.6 oz (125.465 kg)         BMI              36.49 kg/m2                 Progress Notes   Patient ID: Frank Ashley, male   DOB: [birth year], 71 y.o.   MRN: [id number]    Chief Complaint   Patient presents with   .  Routine Post Op       PO GB; reck drain      HPI Frank Ashley is a 71 y.o. male.   HPIPatient returns in followup of his common bile duct stones and gallstones. His percutaneous cholecystostomy tube is functioning well. He does have occasional panty drain site in his back but is eating okay.    Past Medical History   Diagnosis  Date   .  Allergy     .  Diabetes mellitus         Type 2   .  GERD (gastroesophageal reflux disease)     .  Hyperlipidemia     .  Peripheral neuropathy         peripheral neuropathy   .  Renal insufficiency     .  Diabetic foot ulcers     .  Overweight     .  Sleep apnea         CPAP   .  Pancreatitis  02/2007       ACUTE   .  MRSA (methicillin resistant Staphylococcus aureus)     .  CAD (coronary artery disease)  ~1997       S/p stenting but denies MI       Past Surgical History   Procedure  Date   .  Coronary angioplasty with stent placement     .  Penile prosthesis implant         later removed   .  Partial gastrectomy  1973   .  Esophagogastroduodenoscopy  09/16/2011       Procedure: ESOPHAGOGASTRODUODENOSCOPY (EGD);  Surgeon: Barrie Folk, MD;  Location: [facility];  Service: Endoscopy;  Laterality: N/A;   .  Cholecystectomy  09/17/2011       Procedure: CHOLECYSTECTOMY;  Surgeon: Adolph Pollack, MD;   Location: [facility];  Service: General;  Laterality: N/A;  WITH COMMON DUCT EXPLORATION           Family History   Problem  Relation  Age of Onset   .  Colon cancer  Mother  62   .  Colon cancer  Sister  50   .  Colon cancer  Brother  42   .  Stomach cancer  Neg Hx        Social History History   Substance Use Topics   .  Smoking status:  Former Smoker       Types:  Cigarettes       Quit date:  02/25/1960   .  Smokeless tobacco:  Never Used   .  Alcohol Use:  No       Allergies   Allergen  Reactions   .  Hydrocodone         codone family drug   .  Sulfamethoxazole         REACTION: hives       Current Outpatient Prescriptions   Medication  Sig  Dispense  Refill   .  acetaminophen (TYLENOL) 325 MG tablet  Take 2 tablets (650 mg total) by mouth every 6 (six) hours as needed (or Fever >/= 101).         [name]  ALPRAZolam (XANAX) 0.5 MG tablet  Take 1 tablet (0.5 mg total) by mouth 3 (three) times daily as needed. For anxiety.   30 tablet   0   .  aspirin 325 MG EC tablet  Take 325 mg by mouth 2 (two) times daily.           [name]  esomeprazole (NEXIUM) 40 MG capsule  Take 1 capsule (40 mg total) by mouth daily before breakfast.   30 capsule   0   .  fenofibrate 160 MG tablet  TAKE ONE TABLET BY MOUTH EVERY DAY   90 tablet   3   .  furosemide (LASIX) 40 MG tablet  Take 40 mg by mouth daily as needed. For fluid retention.         .  gabapentin (NEURONTIN) 800 MG tablet  TAKE 1 TABLET BY MOUTH 5 TIMES DAILY   450 tablet   3   .  glucose blood test strip  True Track strips. Use to check blood sugar three times daily.   100 each   3   .  INS SYRINGE/NEEDLE 1CC/29G (RELION INSULIN SYRINGE) 29G X 1/2" 1 ML MISC  by Does not apply route. Use as directed two times a day          .  insulin NPH-insulin regular (NOVOLIN 70/30) (70-30) 100 UNIT/ML injection  Inject 80  units subcutaneously in the morning and 75 units in the evening as directed   10 mL   6   .  Insulin Pen Needle (1ST CHOICE PEN  NEEDLES) 31G X 8 MM MISC  by Does not apply route.           [name]  lisinopril (PRINIVIL,ZESTRIL) 10 MG tablet  TAKE ONE TABLET BY MOUTH EVERY DAY   90 tablet   0   .  metoclopramide (REGLAN) 10 MG tablet  Take 10 mg by mouth 4 (four) times daily. One by mouth qid- AC meals and q hs as needed for nausea and vomiting          .  oxiconazole (OXISTAT) 1 % CREA  Apply 1 application topically as needed. For jock itch.         .  senna (SENOKOT) 8.6 MG TABS  Take 1 tablet (8.6 mg total) by mouth 2 (two) times daily.   60 tablet   0   .  simvastatin (ZOCOR) 40 MG tablet  TAKE ONE TABLET BY MOUTH EVERY DAY   90 tablet   3   .  sodium chloride irrigation 0.9 % irrigation  Irrigate with 1,000 mLs as directed as needed.   1000 mL   1   .  triamcinolone cream (KENALOG) 0.1 %  Apply 1 application topically 2 (two) times daily as needed. For rash on back.         .  DISCONTD: esomeprazole (NEXIUM) 40 MG capsule  Take 40 mg by mouth daily before breakfast.         .  HYDROmorphone (DILAUDID) 4 MG tablet  Take 1 tablet (4 mg total) by mouth every 4 (four) hours as needed.   20 tablet   0       Current Facility-Administered Medications   Medication  Dose  Route  Frequency  Provider  Last Rate  Last Dose   .  promethazine (PHENERGAN) injection 25 mg   25 mg  Intramuscular  Once  Kristian Covey, MD         .  TDaP (BOOSTRIX) injection 0.5 mL   0.5 mL  Intramuscular  Once  Kristian Covey, MD            Review of Systems Review of Systems  Constitutional: Negative for fever, chills and unexpected weight change.  HENT: Negative for hearing loss, congestion, sore throat, trouble swallowing and voice change.   Eyes: Negative for visual disturbance.  Respiratory: Negative for cough and wheezing.   Cardiovascular: Negative for chest pain, palpitations and leg swelling.  Gastrointestinal: Negative for nausea, vomiting, abdominal pain, diarrhea, constipation, blood in stool, abdominal distention, anal bleeding and  rectal pain.  Genitourinary: Negative for hematuria and difficulty urinating.  Musculoskeletal: Negative for arthralgias.  Skin: Negative for rash and wound.  Neurological: Negative for seizures, syncope, weakness and headaches.  Hematological: Negative for adenopathy. Does not bruise/bleed easily.  Psychiatric/Behavioral: Negative for confusion.    Blood pressure 128/72, pulse 76, temperature 98.2 F (36.8 C), temperature source Temporal, resp. rate 16, height 6\' 1"  (1.854 m), weight 276 lb 9.6 oz (125.465 kg).   Physical Exam Physical Exam  Constitutional: He appears well-developed and well-nourished.  HENT:   Head: Normocephalic and atraumatic.  Eyes: EOM are normal. Pupils are equal, round, and reactive to light.  Neck: Normal range of motion. Neck supple.  Cardiovascular: Normal rate and regular rhythm.   Pulmonary/Chest: Effort normal and breath sounds normal.  Abdominal: Soft. Normal appearance.          Assessment Choledocholithiasis with history of acute cholecystitis   Billroth II reconstruction due to peptic ulcer disease Right kidney mass       Plan   Patient in need of open cholecystectomy and common bile duct expiration. He also is in need of urological assessment of right kidney mass and determination if this will be tackled at the same time as his cholecystectomy and common duct expiration. This may increase potential operative risk if he requires a right nephrectomy. Once urology sees patient, we can schedule his surgery. Risks of bleeding, infection, the use of T-tube drainage, organ injury, blood clots, death, exacerbation and one medical problems, organ injury, and the need further surgery discussed with the patient.       Raahil Ong A. 12/20/2011

## 2011-12-20 NOTE — Progress Notes (Signed)
Reintubation in PACU secondary to progressive respiratory failure. Under direct laryngoscopy, a 8.0 ETT placed at 22 cm over Bougie without difficulty. Grade 3 view. No complications noted. A Linzey Ramser MD

## 2011-12-20 NOTE — Progress Notes (Signed)
Day of Surgery Subjective: Patient admitted to ICU post-op after re-et by anesthesia for poor resp effort. Getting 1L bolus now for low BP after fentanyl bolus.   Objective: Vital signs in last 24 hours: Temp:  [98 F (36.7 C)-99.5 F (37.5 C)] 99.5 F (37.5 C) (08/19 2000) Pulse Rate:  [79-101] 84  (08/19 2100) Resp:  [0-21] 14  (08/19 2100) BP: (78-149)/(40-63) 78/51 mmHg (08/19 2045) SpO2:  [92 %-100 %] 100 % (08/19 2100) Arterial Line BP: (103-183)/(41-68) 128/47 mmHg (08/19 1615) FiO2 (%):  [1 %-100 %] 40 % (08/19 2052) Weight:  [136 kg (299 lb 13.2 oz)] 136 kg (299 lb 13.2 oz) (08/19 1710)  Intake/Output from previous day:   Intake/Output this shift: Adequate UOP - urine clear without hematuria.   Physical Exam:  Intubated and sedated Dressing C/D/I - no bleeding past marks JP drains with bloody drainage but no full SCD's in place  Lab Results:  Basename 12/20/11 1800 12/20/11 1520 12/20/11 1342  HGB 10.6* 11.0* 9.5*  HCT 33.0* 34.8* 28.0*   BMET  Basename 12/20/11 1800 12/20/11 1342 12/20/11 1242 12/20/11 1047  NA -- 139 143 --  K -- 4.7 4.2 --  CL -- -- -- --  CO2 -- -- -- --  GLUCOSE -- -- 156* 139*  BUN -- -- -- --  CREATININE 1.74* -- -- --  CALCIUM -- -- -- --     Assessment/Plan:  Post-op open chole and right partial Nx Acute respiratory failure  RENAL INSUFFICIENCY, CHRONIC   -H/H and Cr stable. Continue supportive care. Will follow.     LOS: 0 days   Antony Haste 12/20/2011, 9:44 PM
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
## 2011-12-20 NOTE — Progress Notes (Signed)
Pt with poor respiratory effort.  Re intubated per anesthesia.  Transfer to ICU. Ask CCM to see to help with vent management and critical care issues.

## 2011-12-20 NOTE — Transfer of Care (Signed)
Immediate Anesthesia Transfer of Care Note  Patient: Frank Ashley  Procedure(s) Performed: Procedure(s) (LRB): CHOLECYSTECTOMY WITH COMMON DUCT EXPLORATION (N/A) NEPHRECTOMY PARTIAL (Right) INTRAOPERATIVE CHOLANGIOGRAM ()  Patient Location: PACU  Anesthesia Type: General  Level of Consciousness: awake, oriented and patient cooperative  Airway & Oxygen Therapy: Patient Spontanous Breathing and Patient connected to face mask oxygen  Post-op Assessment: Report given to PACU RN, Post -op Vital signs reviewed and stable and Patient moving all extremities X 4  Post vital signs: stable  Complications: No apparent anesthesia complications

## 2011-12-20 NOTE — Therapy (Signed)
Pt in PACU and placed on BIPAP 10/5 @ 100% , BUR 12. ABG's drawn: Results ph 7.05, pco2 72, pO2 304, Bicarb 18.9. Dr. Rica Mast informed and pt intubated with #8.0 ETT. Secured at 22cm at teeth. CXR obtained. Pt placed on vent and transported to ICU.

## 2011-12-20 NOTE — Brief Op Note (Addendum)
12/20/2011  2:18 PM  PATIENT:  Frank Ashley  71 y.o. male  PRE-OPERATIVE DIAGNOSIS: Right renal Mass   POST-OPERATIVE DIAGNOSIS:  Right renal mass  PROCEDURE:  Procedure(s) (LRB): Right partial nephrectomy (following open cholecystectomy on separate op note)  SURGEON:  Surgeon(s) and Role:    * Antony Haste, MD - Primary    * Marcine Matar, MD - Assisting   ANESTHESIA:   general; local with Exparel  EBL:  Total I/O In: 6550 [I.V.:5000; Blood:1050; IV Piggyback:500] Out: 2400 [Urine:200; Blood:2200] - total I/Ofor both cases  BLOOD ADMINISTERED: 3 units PRBC  DRAINS: (RLQ ) Jackson-Pratt drain(s) with closed bulb suction in the RLQ   LOCAL MEDICATIONS USED:  OTHER Exparel in fascia and sub-Q  SPECIMEN:  Biopsy / Limited Resection - right renal mass  DISPOSITION OF SPECIMEN:  PATHOLOGY  COUNTS:  YES x 2  DICTATION: # [id number]  PLAN OF CARE: Admit to inpatient   PATIENT DISPOSITION:  PACU - hemodynamically stable.   Delay start of Pharmacological VTE agent (>24hrs) due to surgical blood loss or risk of bleeding: yes

## 2011-12-20 NOTE — Plan of Care (Signed)
Problem: Phase I Progression Outcomes Goal: Incision/dressings dry and intact Marked bleeding on dressings, post operatively upon arrival to unit.

## 2011-12-20 NOTE — Op Note (Signed)
Preoperative diagnosis: Chronic cholecystitis and choledocholithiasis  Postop diagnosis: Same  Procedure: Open cholecystectomy with common bile duct expiration and removal of common bile duct stones with intraoperative cholangiogram and T-tube cholangiogram/choledocoscopy  Surgeon: Harriette Bouillon M.D.  Assistant: Gaynelle Adu M.D.  Anesthesia: Gen. Endotracheal anesthesia  EBL: 250 cc  Drains: T tube 12 F to common bile duct and round 19 French drain gallbladder fossa  Specimen: Gallbladder  Fluids: 3000 cc crystalloid  Indications for procedure patient presents due to chronic cholecystitis and choledocholithiasis. He had a percutaneous drain placed his gallbladder in May 2013 for chronic cholecystitis. He was found to have common bile duct stones but was not a candidate for ERCP do to previous Billroth II reconstruction. Lengthy preop discussion about options which included transfer to tertiary care center versus open cholecystectomy and common duct expiration were discussed. Risks, benefits and potential the therapies discussed. Actually discussion he was to proceed with open cholecystectomy with common bile duct expiration. Of note he had a right renal cell carcinoma and seen urology for this and they will perform a right nephrectomy during same procedure.The procedure has been discussed with the patient. Operative and non operative treatments have been discussed. Risks of surgery include bleeding, infection,  Common bile duct injury,  Injury to the stomach,liver, colon,small intestine, abdominal wall,  Diaphragm,  Major blood vessels,  And the need for an open procedure.  Other risks include worsening of medical problems, death,  DVT and pulmonary embolism, and cardiovascular events.   Medical options have also been discussed. The patient has been informed of long term expectations of surgery and non surgical options,  The patient agrees to proceed.    Description of procedure: The patient  was met in the holding area. Questions were answered. The right side was marked for nephrectomy. He is taken back to the operating room and placed supine. After induction of general anesthesia, Foley catheter was placed under direct vision and sterile conditions. His abdomen was prepped and draped in a sterile fashion. Timeout was done. A right subcostal incision was made. This is through previous scar from his Billroth II procedure 30 years ago. We into the abdominal cavity without difficulty. There were adhesions taken down from the liver edge with cautery a retractor was placed. The right colon was mobilized away from the lateral attachments. In a the duodenum was kocherized without difficulty. The gallbladder was then excised off the liver bed with cautery. The cystic artery divided between clips. Dissection was taken down the cystic duct. Small incision was made in a [facility] and catheter was used for intraoperative claims repair. He had 2 large stones in the common bile duct. These were obstructing. The duct was 1 cm. The catheter was removed. We followed the cystic duct and the common bile duct and exposed the anterior wall. 3-0 PDS suture were used and placed as stay sutures in the anterior wall of the common bile duct. This was opened using an 11 blade and Potts scissors for a 1 cm opening. Care was taken not to injure the back wall. One large than was visible and removed quickly. A choledochoscope was used and passed distally. One remaining stone was noted. A 3 French Fogarty catheter was used and passed distally through the distal common bile duct into the duodenum. The balloon was inflated in the duodenum and pulled back until resistance encountered and then the balloon was deflated slightly and pulled back to the common duct through the choledochotomy. Stone fragments were  removed. I replaced this scope and saw no residual stone fragments each approximately her distally. A 12 French T-tube was  fashioned and placed in the common bile duct. The duct was closed around the T-tube site with 3-0 PDS suture. T-tube cholangiogram was then performed which showed no residual stones and flow contrast into his duodenum. The T tube a was brought out the midline and secured to the skin and with 2-0 nylon. A second drain and was brought in through separate stab incision to the gallbladder bed. T-tube was flushed easily and drew back bile. There is minimal leakage at the choledochotomy site. Irrigation was used and suctioned out. Surgicel was placed the gallbladder bed. Hemostasis achieved. At this point the case, urology scrubbed  in to perform the right nephrectomy. All counts are correct this point  of sponge, needles and instruments.

## 2011-12-21 ENCOUNTER — Inpatient Hospital Stay (HOSPITAL_COMMUNITY): Payer: Medicare Other

## 2011-12-21 DIAGNOSIS — K811 Chronic cholecystitis: Secondary | ICD-10-CM

## 2011-12-21 DIAGNOSIS — G4730 Sleep apnea, unspecified: Secondary | ICD-10-CM

## 2011-12-21 LAB — GLUCOSE, CAPILLARY
Glucose-Capillary: 166 mg/dL — ABNORMAL HIGH (ref 70–99)
Glucose-Capillary: 225 mg/dL — ABNORMAL HIGH (ref 70–99)

## 2011-12-21 LAB — BASIC METABOLIC PANEL
BUN: 29 mg/dL — ABNORMAL HIGH (ref 6–23)
CO2: 19 mEq/L (ref 19–32)
Calcium: 6.7 mg/dL — ABNORMAL LOW (ref 8.4–10.5)
Chloride: 112 mEq/L (ref 96–112)
Creatinine, Ser: 2.17 mg/dL — ABNORMAL HIGH (ref 0.50–1.35)
GFR calc non Af Amer: 29 mL/min — ABNORMAL LOW (ref >90)
GFR calc non Af Amer: 27 mL/min — ABNORMAL LOW (ref >90)
Glucose, Bld: 210 mg/dL — ABNORMAL HIGH (ref 70–99)
Potassium: 4.9 mEq/L (ref 3.5–5.1)
Sodium: 135 mEq/L (ref 135–145)

## 2011-12-21 LAB — BLOOD GAS, ARTERIAL
O2 Saturation: 95.8 %
Patient temperature: 37
Pressure support: 10 cmH2O
TCO2: 15.9 mmol/L (ref 0–100)
pH, Arterial: 7.237 — ABNORMAL LOW (ref 7.350–7.450)

## 2011-12-21 LAB — CBC
MCH: 25.1 pg — ABNORMAL LOW (ref 26.0–34.0)
MCV: 79.7 fL (ref 78.0–100.0)
Platelets: 155 10*3/uL (ref 150–400)
RBC: 3.79 MIL/uL — ABNORMAL LOW (ref 4.22–5.81)
RDW: 17.4 % — ABNORMAL HIGH (ref 11.5–15.5)
WBC: 7.4 10*3/uL (ref 4.0–10.5)

## 2011-12-21 LAB — LACTIC ACID, PLASMA: Lactic Acid, Venous: 2.3 mmol/L — ABNORMAL HIGH (ref 0.5–2.2)

## 2011-12-21 MED ORDER — CHLORHEXIDINE GLUCONATE 0.12 % MT SOLN
15.0000 mL | Freq: Two times a day (BID) | OROMUCOSAL | Status: DC
  Administered 2011-12-21 – 2011-12-23 (×6): 15 mL via OROMUCOSAL
  Filled 2011-12-21 (×12): qty 15

## 2011-12-21 MED ORDER — SODIUM CHLORIDE 0.9 % IV SOLN
INTRAVENOUS | Status: DC
  Administered 2011-12-21: 10 mL/h via INTRAVENOUS

## 2011-12-21 MED ORDER — SODIUM CHLORIDE 0.9 % IJ SOLN: 9.0000 mL | INTRAMUSCULAR | Status: DC | PRN

## 2011-12-21 MED ORDER — NALOXONE HCL 0.4 MG/ML IJ SOLN: 0.4000 mg | INTRAMUSCULAR | Status: DC | PRN

## 2011-12-21 MED ORDER — DIPHENHYDRAMINE HCL 50 MG/ML IJ SOLN: 12.5000 mg | Freq: Four times a day (QID) | INTRAMUSCULAR | Status: DC | PRN

## 2011-12-21 MED ORDER — SODIUM CHLORIDE 0.45 % IV SOLN
INTRAVENOUS | Status: DC
  Administered 2011-12-21: 10:00:00 via INTRAVENOUS

## 2011-12-21 MED ORDER — DIPHENHYDRAMINE HCL 12.5 MG/5ML PO ELIX: 12.5000 mg | ORAL_SOLUTION | Freq: Four times a day (QID) | ORAL | Status: DC | PRN

## 2011-12-21 MED ORDER — HYDROMORPHONE 0.3 MG/ML IV SOLN
INTRAVENOUS | Status: DC
  Administered 2011-12-21: 12:00:00 via INTRAVENOUS
  Administered 2011-12-21: 1.59 mg via INTRAVENOUS
  Administered 2011-12-21: 3.79 mg via INTRAVENOUS
  Administered 2011-12-22: 18:00:00 via INTRAVENOUS
  Administered 2011-12-22: 0.799 mg via INTRAVENOUS
  Administered 2011-12-22: 1.79 mg via INTRAVENOUS
  Administered 2011-12-22: 1.59 mg via INTRAVENOUS
  Administered 2011-12-22: 01:00:00 via INTRAVENOUS
  Administered 2011-12-22: 1.79 mg via INTRAVENOUS
  Administered 2011-12-22: 1.39 mg via INTRAVENOUS
  Administered 2011-12-23: 0.6 mg via INTRAVENOUS
  Administered 2011-12-23: 3.59 mg via INTRAVENOUS
  Administered 2011-12-23: 2.7 mg via INTRAVENOUS
  Filled 2011-12-21 (×3): qty 25

## 2011-12-21 MED ORDER — BIOTENE DRY MOUTH MT LIQD
15.0000 mL | Freq: Four times a day (QID) | OROMUCOSAL | Status: DC
  Administered 2011-12-21 – 2011-12-24 (×13): 15 mL via OROMUCOSAL

## 2011-12-21 MED ORDER — ONDANSETRON HCL 4 MG/2ML IJ SOLN: 4.0000 mg | Freq: Four times a day (QID) | INTRAMUSCULAR | Status: DC | PRN

## 2011-12-21 MED ORDER — SODIUM BICARBONATE 8.4 % IV SOLN
INTRAVENOUS | Status: DC
  Administered 2011-12-21 – 2011-12-22 (×2): via INTRAVENOUS
  Filled 2011-12-21 (×4): qty 150

## 2011-12-21 MED ORDER — CHLORHEXIDINE GLUCONATE 0.12 % MT SOLN: 15.0000 mL | Freq: Two times a day (BID) | OROMUCOSAL | Status: DC

## 2011-12-21 NOTE — Progress Notes (Signed)
CARE MANAGEMENT NOTE 12/21/2011  Patient:  Frank Ashley, Frank Ashley   Account Number:  [id number]  Date Initiated:  12/21/2011  Documentation initiated by:  Bodin Gorka  Subjective/Objective Assessment:   pt had surg cholecystectomy and renal mass excision, was extubated in pacu then became hypoxic and required reintubation.     Action/Plan:   lives at home   Anticipated DC Date:  12/24/2011   Anticipated DC Plan:  HOME/SELF CARE  In-house referral  NA      DC Planning Services  NA      [facility] Choice  NA   Choice offered to / List presented to:     DME arranged  NA      DME agency  NA     HH arranged  NA      HH agency  NA   Status of service:  In process, will continue to follow Medicare Important Message given?  NA - LOS <3 / Initial given by admissions (If response is "NO", the following Medicare IM given date fields will be blank) Date Medicare IM given:   Date Additional Medicare IM given:    Discharge Disposition:    Per UR Regulation:  Reviewed for med. necessity/level of care/duration of stay  If discussed at Long Length of Stay Meetings, dates discussed:    Comments:  [id number]/Tylee Yum Earlene Plater, RN, BSN, CCM: CHART REVIEWED AND UPDATED. NO DISCHARGE NEEDS PRESENT AT THIS TIME. CASE MANAGEMENT [phone]

## 2011-12-21 NOTE — Consult Note (Signed)
Name: Frank Ashley MRN: [id number] DOB: [birth year]    LOS: 1  Referring Provider:  [place]  Reason for Referral:  Post op resp failure/ re-tubed in PACU for poor resp effort  PULMONARY / CRITICAL CARE MEDICINE  HPI:  75 yowm on ACEI with min remote smoking hx very sedentary but not limited by sob sp Open cholecystectomy with common bile duct expiration and removal of common bile duct stones with intraoperative cholangiogram and T-tube cholangiogram/choledocoscopy and Right partial nephrectomy  on 8/19 and required re-et in pacu for poor resp effort, no obvious aspiration or excess secretions > tx to ICU and PCCM service requested   nursing reports difficult re-et but no stridor p extubation  Does have h/o osa but not using cpap x 10y and no excess hypersomnolence noted by fm   Current Status: Intubated, wide awake on fent gtt  Vital Signs: Temp:  [98.5 F (36.9 C)-99.5 F (37.5 C)] 98.8 F (37.1 C) (08/20 0800) Pulse Rate:  [78-101] 86  (08/20 0800) Resp:  [0-21] 11  (08/20 0800) BP: (78-149)/(40-63) 107/41 mmHg (08/20 0800) SpO2:  [92 %-100 %] 96 % (08/20 0800) Arterial Line BP: (103-183)/(41-68) 128/47 mmHg (08/19 1615) FiO2 (%):  [1 %-100 %] 30 % (08/20 0415) Weight:  [299 lb 13.2 oz (136 kg)] 299 lb 13.2 oz (136 kg) (08/19 1710)  Physical Examination: General: wide awake on fent gtt HEENT ET in place Neck supple No jvd Lungs with distant bs RRR NSR no s3  abd soft, post op, dressings dry and drain with bloody drainage Ext Post pas, no edema        Active Problems:  DM w/o complication type II  HYPERLIPIDEMIA  Overweight  GERD  RENAL INSUFFICIENCY, CHRONIC  SLEEP APNEA  Acute respiratory failure   ASSESSMENT AND PLAN  PULMONARY  Lab 12/20/11 1712 12/20/11 1530 12/20/11 1350 12/20/11 1342  PHART 7.206* 7.022* 7.296* 7.260*  PCO2ART 41.4 79.9* 32.7* 34.4*  PO2ART 117.0* 316.0* 210.0* 204.0*  HCO3 15.8* 18.9* 15.6* 15.6*  O2SAT 97.5 98.4 98.7 100.0     Ventilator Settings: Vent Mode:  [-] PRVC FiO2 (%):  [1 %-100 %] 30 % Set Rate:  [15 bmp] 15 bmp Vt Set:  [640 mL] 640 mL PEEP:  [5 cmH20] 5 cmH20 Plateau Pressure:  [13 cmH20-18 cmH20] 13 cmH20 CXR:  No new infx ETT:  8/19 >>>  A: Acute resp failure/ hypercarbia post op on ACEI pre op but no stridor reported - likely sedation/ poor abd compliance main problems and underlying OSA with narcotics.  P  Weaning well with Tv 800 cc on PS 5/5  extubate 8/20.        Use cpap to tx underlying OSA during sleep - concern that he will be on PCA x 24-48h   CARDIOVASCULAR No results found for this basename: TROPONINI:5,LATICACIDVEN:5, O2SATVEN:5,PROBNP:5 in the last 168 hours   Lines: R Art line RA 8/19>>>  A: Hemodynamics ok, acei on hold  RENAL  Lab 12/21/11 0443 12/20/11 1800 12/20/11 1342 12/20/11 1242 12/20/11 1047 12/14/11 1230  NA 135 -- 139 143 140 141  K 5.1 -- 4.7 -- -- --  CL 113* -- -- -- -- 110  CO2 17* -- -- -- -- 24  BUN 23 -- -- -- -- 25*  CREATININE 2.17* 1.74* -- -- -- 1.75*  CALCIUM 6.7* -- -- -- -- 9.0  MG -- -- -- -- -- --  PHOS -- -- -- -- -- --   Intake/Output  08/19 0701 - 08/20 0700 08/20 0701 - 08/21 0700   I.V. (mL/kg) 7887.1 (58) 110 (0.8)   Blood 1050    IV Piggyback 2010    Total Intake(mL/kg) 10947.1 (80.5) 110 (0.8)   Urine (mL/kg/hr) 1530 (0.5)    Drains 578    Blood 2200    Total Output 4308    Net +6639.1 +110         Foley:  8/19  A:  CRI on ACEI,  s/p right partial nephrectomy  8/19 Lab Results  Component Value Date   CREATININE 2.17* 12/21/2011   CREATININE 1.74* 12/20/2011   CREATININE 1.75* 12/14/2011    P Keep hydrated/ hold further acei     Change IVF to bicarb drip  and dc kcl   GASTROINTESTINAL  Lab 12/14/11 1230  AST 15  ALT 12  ALKPHOS 47  BILITOT 0.2*  PROT 6.5  ALBUMIN 3.5    A:  No issues Start PO in 12 h  HEMATOLOGIC  Lab 12/21/11 0443 12/20/11 1800 12/20/11 1520 12/20/11 1342 12/20/11 1242  12/14/11 1230  HGB 9.5* 10.6* 11.0* 9.5* 9.9* --  HCT 30.2* 33.0* 34.8* 28.0* 29.0* --  PLT 155 180 -- -- -- 254  INR -- -- -- -- -- --  APTT -- -- -- -- -- --   A: acute blood loss anemia s/p 2 units prbc's intraop 8/19 P:  Threshold to tx hgb < 7 unless obvious acute bleeding   INFECTIOUS  Lab 12/21/11 0443 12/20/11 1800 12/14/11 1230  WBC 7.4 7.6 7.0  PROCALCITON -- -- --   Cultures:   Antibiotics:  Ancef pre op 8/19 Cipro pre op 8/19  A:  No obvious infection cipro x 5ds  per urology/CCS  ENDOCRINE  Lab 12/21/11 0747 12/21/11 0314 12/20/11 2340 12/20/11 1945 12/20/11 1456  GLUCAP 168* 166* 166* 162* 158*   A:  AODM P  SSI  NEUROLOGIC  A: Intact R/o hypothyroid > tsh sent 8/19(.807)  BEST PRACTICE / DISPOSITION Level of Care:  ICU Primary Service:  CCS Consultants:  PCCM/urology Code Status:  Full Diet:  NPO DVT Px:  PAS GI Px:  ppi  Skin Integrity:  OK Social / Family:  At bedside  [facility] Minor ACNP Adolph Pollack PCCM Pager [phone] till 3 pm If no answer page [phone]  12/21/2011, 8:37 AM   Care during the described time interval was provided by me and/or other providers on the critical care team.  I have reviewed this patient's available data, including medical history, events of note, physical examination and test results as part of my evaluation  CC time x  40 m  Cyril Mourning MD. Tonny Bollman. [facility] Pager [phone] If no response call [phone]

## 2011-12-21 NOTE — Progress Notes (Signed)
1 Day Post-Op Subjective: Patient alert and in good spirits. Shaking hands and gave thumbs up.  Objective: Vital signs in last 24 hours: Temp:  [98.5 F (36.9 C)-99.5 F (37.5 C)] 98.8 F (37.1 C) (08/20 0400) Pulse Rate:  [78-101] 79  (08/20 0500) Resp:  [0-21] 15  (08/20 0630) BP: (78-149)/(40-63) 122/47 mmHg (08/20 0415) SpO2:  [92 %-100 %] 100 % (08/20 0500) Arterial Line BP: (103-183)/(41-68) 128/47 mmHg (08/19 1615) FiO2 (%):  [1 %-100 %] 30 % (08/20 0415) Weight:  [136 kg (299 lb 13.2 oz)] 136 kg (299 lb 13.2 oz) (08/19 1710)  Intake/Output from previous day: 08/19 0701 - 08/20 0700 In: 10837.1 [I.V.:7777.1; Blood:1050; IV Piggyback:2010] Out: 4208 [Urine:1470; Drains:538; Blood:2200] Intake/Output this shift:    Physical Exam:  Intubated but alert Abd - soft, JP's serosanguinous and output slowing down.  SCD's in place Urine clear  Lab Results:  Basename 12/21/11 0443 12/20/11 1800 12/20/11 1520  HGB 9.5* 10.6* 11.0*  HCT 30.2* 33.0* 34.8*   BMET  Basename 12/21/11 0443 12/20/11 1800 12/20/11 1342 12/20/11 1242  NA 135 -- 139 --  K 5.1 -- 4.7 --  CL 113* -- -- --  CO2 17* -- -- --  GLUCOSE 197* -- -- 156*  BUN 23 -- -- --  CREATININE 2.17* 1.74* -- --  CALCIUM 6.7* -- -- --   No results found for this basename: LABPT:3,INR:3 in the last 72 hours No results found for this basename: LABURIN:1 in the last 72 hours Results for orders placed during the hospital encounter of 12/14/11  SURGICAL PCR SCREEN     Status: Normal   Collection Time   12/14/11 11:26 AM      Component Value Range Status Comment   MRSA, PCR NEGATIVE  NEGATIVE Final    Staphylococcus aureus NEGATIVE  NEGATIVE Final       Assessment/Plan: s/p Procedure(s) (LRB):  CHOLECYSTECTOMY WITH COMMON DUCT EXPLORATION (N/A)  NEPHRECTOMY PARTIAL (Right)  INTRAOPERATIVE CHOLANGIOGRAM () CRI -slight bump in Cr likely from right kidney ischemia during partial, hypotension, blood loss, etc.  Cont to monitor. -will d/c foley when pt up and ambulatory    LOS: 1 day   Antony Haste 12/21/2011, 7:42 AM

## 2011-12-21 NOTE — Op Note (Signed)
NAME:  Frank Ashley, Frank Ashley NO.:  [id number]  MEDICAL RECORD NO.:  [id number]  LOCATION:  1235                         FACILITY:  [facility]  PHYSICIAN:  Jerilee Field, MD   DATE OF BIRTH:  10/02/1940  DATE OF PROCEDURE:  12/20/2011 DATE OF DISCHARGE:                              OPERATIVE REPORT   PREOPERATIVE DIAGNOSIS:  Right renal mass.  POSTOPERATIVE DIAGNOSIS:  Right renal mass.  PROCEDURE:  Right partial nephrectomy.  SURGEON:  Jerilee Field, MD.  ASSISTANT:  Bertram Millard. Dahlstedt, M.D.  ANESTHESIA:  General and local injection of Exparel.  DESCRIPTION OF PROCEDURE:  The patient was taken to the operating room by Dr. Luisa Hart for open cholecystectomy and other procedures. Dr. Luisa Hart had mobilized the ascending and transverse colon to expose the right kidney and Retroperitoneum. Urology was called in to the room.  A second time- out was performed to confirm the patient and procedure.  We started medially and Kocherized the duodenum to identify the right renal hilum, and the renal vein and renal artery were readily identified.  Next, we worked around the lower pole to free it from the surrounding structures and the the psoas muscle posteriorly.  We were able to identify the gonadal vein, and the retroperitoneal fat containing the ureter, although we did not dissect the ureter specifically.  We then opened Gerota's fascia from lateral to medial and slightly caudal to cranial over the lower pole to expose the kidney.  We were able also to expose the renal pelvis and where the renal pelvis met the Renal parenchyma.  The right lower pole mass was easily palpable but the perinephric fat was fused to the kidney and despite working to free the fat from the kidney and the renal mass, we could not do so. Therefore, we made a subcapsular incision anteriorly and superior to the mass.  This allowed [place] to come around medially and laterally until we were able to better  identify the location of the renal mass.  As the mass traveled through the kidney, it went more superior and toward the renal hilum and collecting system, as one traveled from posterior to anterior.  Having identified the mass, bulldog clamp was placed on the renal artery after 25 g of mannitol were given IV.  A Vi-Drape was then placed around the kidney and ice slush was used to cool the kidney. Next, the renal capsule was then cauterized going medially and laterally and posteriorly to demarcate the remaining line of incision. The subcapsular incision had been started anterior.  Through the subcapsular incision, sharp dissection of the mass was undertaken, but as we went further the mass was entered, therefore we went more superior and were able to get on top of the renal mass into normal parenchyma come around behind it and medial and lateral, and finally to resect the mass in its entirety with sharp and blunt dissection.  There was a deeper bleeding vein toward the hilum and with some manual pressure on the kidney to control it a 2-0 figure-of-eight Vicryl was thrown deep in this area and good hemostasis was achieved.  The argon beam was used to fulgurate the kidney parenchyma peripherally.  There was 1 lobular artery that was sutured with a 2-0 figure-of-eight.  This provided good hemostasis. The remaining parenchyma appeared normal with no residual tumor. Next, using a 3-0 V-Loc Vicryl suture, the defect was run closed centrally which also began to pull the renal parenchyma together.  A Surgicel bolster was then placed in the defect and a double- armed 2-0 V-Loc Vicryl suture was then placed and renal defect and capsule were closed in a running horizontal mattress fashion over the Surgicel.  FloSeal was then applied to this and the bulldog was released from the artery corresponding to a 25-minute ischemic time.  There was excellent hemostasis.  Gerota's fascia was then closed over the  kidney with some interrupted 2-0 Vicryl suture.  A Blake drain was placed in the right retroperitoneum exiting through a separate stab wound in the right lower quadrant and sutured to the skin with a 2-0 nylon suture.  The entire wound including the liver bed and the retroperitoneum was irrigated.  The retractors and lap, sponges were removed, and the counts were correct x2.  The colon and omentum was placed back in the RUQ to cover the right retroperitoneum. The omentum was still healthy and affixed to the colon.  The fascia was then run closed with a #1 looped PDS suture starting medial and lateral and meeting in the middle in a running 1-layered closure.  Laterally, several interrupted 0 Vicryl figure-of-eight sutures were placed to provide second layer of closure where the fascia was more readily identifiable in 2 layers.  The subcostal incision was through the patients prior incision. A significant portion of the fascial layers were fused.  All 3 drains were identified and excluded from the closure.  The wound was irrigated and the skin was then reapproximated with staples.  The sterile dressing, 4x4s, and an island dressing were then placed.  The right lower quadrant drain was connected to bulb suction.  Also, while closing the fascia and the skin, 70 mL of Exparel was injected into the wound in 1-2 mL aliquots.  The patient was then extubated and taken to recovery room in stable condition.  ESTIMATED BLOOD LOSS:  2200 mL total for both cases.  FLUIDS:  5000 mL crystalloid, 500 mL Hespan, 1050 mL blood total for both cases  BLOOD ADMINISTERED:  Three units of packed red cells total for both cases (which included 2 units during the general surgery portion of the procedure and 1 unit during the urology portion)  DRAINS:  Right lower quadrant JP drain for the partial nephrectomy portion.  SPECIMEN:  Right renal mass to pathology.    COUNT: correct x 2.  DISPOSITION:  The patient  was extubated and taken to the recovery room in stable condition.  He will be placed in the step-down unit for close observation.          ______________________________ Jerilee Field, MD     ME/MEDQ  D:  12/20/2011  T:  12/21/2011  Job:  [id number]

## 2011-12-21 NOTE — Plan of Care (Signed)
Problem: Phase I Progression Outcomes Goal: Voiding-avoid urinary catheter unless indicated Outcome: Not Met (add Reason) DC post op day two per physician order.

## 2011-12-21 NOTE — Patient Care Conference (Signed)
Patient extubated to a nasal cannula at 2lpm. Vital signs stable. HR 91, BP 119/46/ o2 sat 100%,RR 19. Will continue to monitor. Patient is to wear at night.

## 2011-12-21 NOTE — Progress Notes (Signed)
1 Day Post-Op  Subjective: Intubated but awake and alert  Objective: Vital signs in last 24 hours: Temp:  [98.5 F (36.9 C)-99.5 F (37.5 C)] 98.8 F (37.1 C) (08/20 0400) Pulse Rate:  [78-101] 79  (08/20 0500) Resp:  [0-21] 15  (08/20 0630) BP: (78-149)/(40-63) 122/47 mmHg (08/20 0415) SpO2:  [92 %-100 %] 100 % (08/20 0500) Arterial Line BP: (103-183)/(41-68) 128/47 mmHg (08/19 1615) FiO2 (%):  [1 %-100 %] 30 % (08/20 0415) Weight:  [299 lb 13.2 oz (136 kg)] 299 lb 13.2 oz (136 kg) (08/19 1710) Last BM Date:  (prior to admission)  Intake/Output from previous day: 08/19 0701 - 08/20 0700 In: 10837.1 [I.V.:7777.1; Blood:1050; IV Piggyback:2010] Out: 4208 [Urine:1470; Drains:538; Blood:2200] Intake/Output this shift:    Incision/Wound:T tube with bilious drainage.  Dressing with minimal drainage.  JP serosanguinous.  Lab Results:   Basename 12/21/11 0443 12/20/11 1800  WBC 7.4 7.6  HGB 9.5* 10.6*  HCT 30.2* 33.0*  PLT 155 180   BMET  Basename 12/21/11 0443 12/20/11 1800 12/20/11 1342 12/20/11 1242  NA 135 -- 139 --  K 5.1 -- 4.7 --  CL 113* -- -- --  CO2 17* -- -- --  GLUCOSE 197* -- -- 156*  BUN 23 -- -- --  CREATININE 2.17* 1.74* -- --  CALCIUM 6.7* -- -- --   PT/INR No results found for this basename: LABPROT:2,INR:2 in the last 72 hours ABG  Basename 12/20/11 1712 12/20/11 1530  PHART 7.206* 7.022*  HCO3 15.8* 18.9*    Studies/Results: X-ray Chest Pa Or Ap  12/20/2011  *RADIOLOGY REPORT*  Clinical Data: Ventilator dependent respiratory failure.  CHEST - 1 VIEW  Comparison: 12/14/2011  Findings: There is been placement of an endotracheal tube, with tips seen approximately 4.5 cm above the carina.  Low lung volumes are seen.  Mild bilateral perihilar opacities seen which may be due to atelectasis or edema.  Peripheral lung fields are clear.  No evidence of pleural effusion.  Heart size is within normal limits.  IMPRESSION:  1.  Endotracheal tube in  appropriate position. 2.  Low lung volumes with mild bilateral perihilar atelectasis versus edema.   Original Report Authenticated By: Danae Orleans, M.D.    Dg Cholangiogram Operative  12/20/2011  *RADIOLOGY REPORT*  Clinical data:  Retained bile duct stones on the earlier intraoperative cholangiography.  The surgical procedure was turned into an open cholecystectomy with stone removal.  INTRAOPERATIVE CHOLANGIOGRAM  12/20/2011 1047 hours:  Comparison: Intraoperative cholangiogram earlier same date 0920 hours.  Findings:  A series of cholangiographic images from the C-arm fluoroscopic device were submitted for interpretation post- operatively.  A T-tube is now present in the common bile duct with excellent opacification of the common bile duct, common hepatic duct, and proximal intrahepatic ducts.  2 retained gallstones identified on the earlier cholangiogram are no longer visualized. There is now antegrade flow into the duodenum.  Please correlate with findings at real time fluoroscopy.  IMPRESSION: Interval removal of the 2 retained bile duct since the earlier cholangiogram.  No evidence of bile duct obstruction.  T-tube in place.  These images were submitted for radiologic interpretation only. Please see the procedural report for the amount of contrast and the fluoroscopy time utilized.   Original Report Authenticated By: Arnell Sieving, M.D. ( 12/20/2011 11:18:14 )    Dg Cholangiogram Operative  12/20/2011  *RADIOLOGY REPORT*  Clinical Data: Cholecystectomy for common duct stones  INTRAOPERATIVE CHOLANGIOGRAM  Comparison:  MRI 10/21/2011  Findings: Contrast is injected through the cystic duct remnant. The exam discloses to stones within the common duct. No contrast is seen entering the duodenum.  This could be due to a distal obstructing lesion or insufficient injection.  Slightly heterogeneous filling of the distal common bile duct could be due to sludge or small stone material.  IMPRESSION: Two  definite stones in the common bile duct.  Heterogeneous filling of the distal common duct.  See above.  No contrast seen entering the duodenum.   Original Report Authenticated By: Thomasenia Sales, M.D. ( 12/20/2011 09:54:35 )     Anti-infectives: Anti-infectives     Start     Dose/Rate Route Frequency Ordered Stop   12/20/11 0630   ciprofloxacin (CIPRO) IVPB 400 mg        400 mg 200 mL/hr over 60 Minutes Intravenous 120 min pre-op 12/20/11 0617 12/20/11 0720          Assessment/Plan: s/p Procedure(s) (LRB): CHOLECYSTECTOMY WITH COMMON DUCT EXPLORATION (N/A) NEPHRECTOMY PARTIAL (Right) INTRAOPERATIVE CHOLANGIOGRAM () Hopefully extubate today. Start clears if extubated. Will need 5 days of cipro for T tube Chronic renal insufficiency  Slight bump in Cr cont IVF and follow. Diabetes Mellitus type 2   SSI    LOS: 1 day    Sherolyn Trettin A. 12/21/2011

## 2011-12-22 ENCOUNTER — Inpatient Hospital Stay (HOSPITAL_COMMUNITY): Payer: Medicare Other

## 2011-12-22 ENCOUNTER — Encounter (HOSPITAL_COMMUNITY): Payer: Self-pay | Admitting: Surgery

## 2011-12-22 DIAGNOSIS — N179 Acute kidney failure, unspecified: Secondary | ICD-10-CM | POA: Diagnosis not present

## 2011-12-22 LAB — BLOOD GAS, ARTERIAL
Acid-base deficit: 8 mmol/L — ABNORMAL HIGH (ref 0.0–2.0)
Bicarbonate: 17.6 mEq/L — ABNORMAL LOW (ref 20.0–24.0)
TCO2: 16.9 mmol/L (ref 0–100)
pCO2 arterial: 38.4 mmHg (ref 35.0–45.0)

## 2011-12-22 LAB — GLUCOSE, CAPILLARY
Glucose-Capillary: 152 mg/dL — ABNORMAL HIGH (ref 70–99)
Glucose-Capillary: 165 mg/dL — ABNORMAL HIGH (ref 70–99)
Glucose-Capillary: 178 mg/dL — ABNORMAL HIGH (ref 70–99)
Glucose-Capillary: 209 mg/dL — ABNORMAL HIGH (ref 70–99)

## 2011-12-22 LAB — COMPREHENSIVE METABOLIC PANEL
AST: 118 U/L — ABNORMAL HIGH (ref 0–37)
Albumin: 2.1 g/dL — ABNORMAL LOW (ref 3.5–5.2)
Alkaline Phosphatase: 35 U/L — ABNORMAL LOW (ref 39–117)
Chloride: 104 mEq/L (ref 96–112)
Potassium: 4.6 mEq/L (ref 3.5–5.1)
Total Bilirubin: 0.9 mg/dL (ref 0.3–1.2)

## 2011-12-22 LAB — CBC
HCT: 27.6 % — ABNORMAL LOW (ref 39.0–52.0)
Hemoglobin: 8.9 g/dL — ABNORMAL LOW (ref 13.0–17.0)
MCV: 79.1 fL (ref 78.0–100.0)
RBC: 3.49 MIL/uL — ABNORMAL LOW (ref 4.22–5.81)
RDW: 17.8 % — ABNORMAL HIGH (ref 11.5–15.5)
WBC: 6.9 10*3/uL (ref 4.0–10.5)

## 2011-12-22 LAB — MAGNESIUM: Magnesium: 1.8 mg/dL (ref 1.5–2.5)

## 2011-12-22 LAB — CREATININE, FLUID (PLEURAL, PERITONEAL, JP DRAINAGE): Creat, Fluid: 2.3 mg/dL

## 2011-12-22 MED ORDER — ZOLPIDEM TARTRATE 5 MG PO TABS
5.0000 mg | ORAL_TABLET | Freq: Every evening | ORAL | Status: DC | PRN
  Administered 2011-12-22 – 2012-01-02 (×4): 5 mg via ORAL
  Filled 2011-12-22 (×4): qty 1

## 2011-12-22 MED ORDER — PANTOPRAZOLE SODIUM 40 MG PO TBEC
40.0000 mg | DELAYED_RELEASE_TABLET | Freq: Every day | ORAL | Status: DC
  Administered 2011-12-22 – 2011-12-31 (×8): 40 mg via ORAL
  Filled 2011-12-22 (×11): qty 1

## 2011-12-22 MED ORDER — SODIUM BICARBONATE 650 MG PO TABS
650.0000 mg | ORAL_TABLET | Freq: Two times a day (BID) | ORAL | Status: DC
  Administered 2011-12-22 (×2): 650 mg via ORAL
  Filled 2011-12-22 (×4): qty 1

## 2011-12-22 MED ORDER — DEXTROSE-NACL 5-0.9 % IV SOLN
INTRAVENOUS | Status: DC
  Administered 2011-12-22 – 2011-12-23 (×2): via INTRAVENOUS

## 2011-12-22 MED ORDER — CIPROFLOXACIN IN D5W 400 MG/200ML IV SOLN
400.0000 mg | Freq: Two times a day (BID) | INTRAVENOUS | Status: DC
  Administered 2011-12-22 (×2): 400 mg via INTRAVENOUS
  Filled 2011-12-22 (×3): qty 200

## 2011-12-22 MED ORDER — GABAPENTIN 300 MG PO CAPS
300.0000 mg | ORAL_CAPSULE | Freq: Three times a day (TID) | ORAL | Status: DC
  Administered 2011-12-22 – 2011-12-31 (×22): 300 mg via ORAL
  Filled 2011-12-22 (×33): qty 1

## 2011-12-22 MED ORDER — ALPRAZOLAM 0.5 MG PO TABS
0.5000 mg | ORAL_TABLET | Freq: Three times a day (TID) | ORAL | Status: DC | PRN
  Administered 2011-12-22 – 2011-12-27 (×6): 0.5 mg via ORAL
  Filled 2011-12-22 (×6): qty 1

## 2011-12-22 MED ORDER — POLYETHYLENE GLYCOL 3350 17 G PO PACK
17.0000 g | PACK | Freq: Every day | ORAL | Status: DC
  Administered 2011-12-24 – 2011-12-29 (×4): 17 g via ORAL
  Filled 2011-12-22 (×11): qty 1

## 2011-12-22 NOTE — Progress Notes (Signed)
Inpatient Diabetes Program Recommendations  AACE/ADA: New Consensus Statement on Inpatient Glycemic Control (2013)  Target Ranges:  Prepandial:   less than 140 mg/dL      Peak postprandial:   less than 180 mg/dL (1-2 hours)      Critically ill patients:  140 - 180 mg/dL   Reason for Visit: Hyperglycemia   71 yo wm on ACEI with min remote smoking hx very sedentary but not limited by sob sp Open cholecystectomy with common bile duct expiration and removal of common bile duct stones with intraoperative cholangiogram and T-tube cholangiogram/choledocoscopy and Right partial nephrectomy on 8/19 and required re-et in pacu for poor resp effort, no obvious aspiration or excess secretions > tx to ICU  Results for Frank Ashley, Frank Ashley (MRN [id number]) as of 12/22/2011 13:00  Ref. Range 12/22/2011 06:00  Sodium Latest Range: 135-145 mEq/L 132 (L)  Potassium Latest Range: 3.5-5.1 mEq/L 4.6  Chloride Latest Range: 96-112 mEq/L 104  CO2 Latest Range: 19-32 mEq/L 23  BUN Latest Range: 6-23 mg/dL 36 (H)  Creatinine Latest Range: 0.50-1.35 mg/dL 2.44 (H)  Calcium Latest Range: 8.4-10.5 mg/dL 7.2 (L)  GFR calc non Af Amer Latest Range: >90 mL/min 26 (L)  GFR calc Af Amer Latest Range: >90 mL/min 30 (L)  GResults for Frank Ashley, Frank Ashley (MRN [id number]) as of 12/22/2011 13:00  Ref. Range 12/21/2011 11:30 12/21/2011 16:02 12/21/2011 19:12 12/21/2011 23:35 12/22/2011 03:34 12/22/2011 08:24 12/22/2011 11:57  Glucose-Capillary Latest Range: 70-99 mg/dL 644 (H) 034 (H) 742 (H) 209 (H) 188 (H) 169 (H) 154 (H)   Latest Range: 70-99 mg/dL 595 (H)    Pt on 63/87 insulin 80 units ac Breakfast and 75 units ac Dinner.  Recommendations:  When po intake advances, restart 70/30 at half of home dose. Will follow.

## 2011-12-22 NOTE — Progress Notes (Signed)
2 Days Post-Op Subjective: Patient reports some incisional pain. No flatus. No N/V.  Objective: Vital signs in last 24 hours: Temp:  [96.3 F (35.7 C)-99 F (37.2 C)] 98 F (36.7 C) (08/21 0800) Pulse Rate:  [29-105] 92  (08/21 0617) Resp:  [10-19] 11  (08/21 0617) BP: (112-134)/(43-59) 120/49 mmHg (08/21 0617) SpO2:  [96 %-100 %] 99 % (08/21 0617) FiO2 (%):  [30 %] 30 % (08/20 0913)  Intake/Output from previous day: 08/20 0701 - 08/21 0700 In: 1845.9 [P.O.:60; I.V.:1775.9; IV Piggyback:10] Out: 1195 [Urine:825; Drains:370] Intake/Output this shift:    Physical Exam:  NAD A&O Abd - soft, NT, incision C/D/I Ext - SCD's GU - foley - urine clear JP's decreased output  Lab Results:  Basename 12/22/11 0600 12/21/11 0443 12/20/11 1800  HGB 8.9* 9.5* 10.6*  HCT 27.6* 30.2* 33.0*   BMET  Basename 12/22/11 0600 12/21/11 1453  NA 132* 136  K 4.6 4.9  CL 104 112  CO2 23 19  GLUCOSE 189* 210*  BUN 36* 29*  CREATININE 2.39* 2.30*  CALCIUM 7.2* 6.9*   No results found for this basename: LABPT:3,INR:3 in the last 72 hours No results found for this basename: LABURIN:1 in the last 72 hours Results for orders placed during the hospital encounter of 12/14/11  SURGICAL PCR SCREEN     Status: Normal   Collection Time   12/14/11 11:26 AM      Component Value Range Status Comment   MRSA, PCR NEGATIVE  NEGATIVE Final    Staphylococcus aureus NEGATIVE  NEGATIVE Final     Studies/Results: X-ray Chest Pa Or Ap  12/20/2011  *RADIOLOGY REPORT*  Clinical Data: Ventilator dependent respiratory failure.  CHEST - 1 VIEW  Comparison: 12/14/2011  Findings: There is been placement of an endotracheal tube, with tips seen approximately 4.5 cm above the carina.  Low lung volumes are seen.  Mild bilateral perihilar opacities seen which may be due to atelectasis or edema.  Peripheral lung fields are clear.  No evidence of pleural effusion.  Heart size is within normal limits.  IMPRESSION:  1.   Endotracheal tube in appropriate position. 2.  Low lung volumes with mild bilateral perihilar atelectasis versus edema.   Original Report Authenticated By: Danae Orleans, M.D.    Dg Cholangiogram Operative  12/20/2011  *RADIOLOGY REPORT*  Clinical data:  Retained bile duct stones on the earlier intraoperative cholangiography.  The surgical procedure was turned into an open cholecystectomy with stone removal.  INTRAOPERATIVE CHOLANGIOGRAM  12/20/2011 1047 hours:  Comparison: Intraoperative cholangiogram earlier same date 0920 hours.  Findings:  A series of cholangiographic images from the C-arm fluoroscopic device were submitted for interpretation post- operatively.  A T-tube is now present in the common bile duct with excellent opacification of the common bile duct, common hepatic duct, and proximal intrahepatic ducts.  2 retained gallstones identified on the earlier cholangiogram are no longer visualized. There is now antegrade flow into the duodenum.  Please correlate with findings at real time fluoroscopy.  IMPRESSION: Interval removal of the 2 retained bile duct since the earlier cholangiogram.  No evidence of bile duct obstruction.  T-tube in place.  These images were submitted for radiologic interpretation only. Please see the procedural report for the amount of contrast and the fluoroscopy time utilized.   Original Report Authenticated By: Arnell Sieving, M.D. ( 12/20/2011 11:18:14 )    Dg Cholangiogram Operative  12/20/2011  *RADIOLOGY REPORT*  Clinical Data: Cholecystectomy for common duct stones  INTRAOPERATIVE CHOLANGIOGRAM  Comparison:  MRI 10/21/2011  Findings: Contrast is injected through the cystic duct remnant. The exam discloses to stones within the common duct. No contrast is seen entering the duodenum.  This could be due to a distal obstructing lesion or insufficient injection.  Slightly heterogeneous filling of the distal common bile duct could be due to sludge or small stone material.   IMPRESSION: Two definite stones in the common bile duct.  Heterogeneous filling of the distal common duct.  See above.  No contrast seen entering the duodenum.   Original Report Authenticated By: Thomasenia Sales, M.D. ( 12/20/2011 09:54:35 )    Dg Chest Port 1 View  12/22/2011  *RADIOLOGY REPORT*  Clinical Data: Extubated and atelectasis.  PORTABLE CHEST - 1 VIEW  Comparison: 12/21/2011  Findings: The endotracheal tube has been removed.  There are low lung volumes with bilateral patchy linear densities.  Findings are suggestive for atelectasis.  Heart and mediastinum are stable. Negative for a pneumothorax.  IMPRESSION: Post extubation with low lung volumes and bilateral atelectasis.   Original Report Authenticated By: Richarda Overlie, M.D.    Dg Chest Port 1 View  12/21/2011  *RADIOLOGY REPORT*  Clinical Data: Intubation, ventilatory support, obstructive sleep apnea  PORTABLE CHEST - 1 VIEW  Comparison: 12/20/2011  Findings: Endotracheal tube 5.7 cm above the carina.  Persistent low lung volumes with basilar atelectasis.  Stable heart size and vascularity.  No enlarging effusion or pneumothorax.  IMPRESSION: Stable portable chest exam   Original Report Authenticated By: Judie Petit. Ruel Favors, M.D.     Assessment/Plan: -continue supportive care -I sent RLQ JP for Creatinine -no other GU recs -will follow   LOS: 2 days   Antony Haste 12/22/2011, 8:43 AM

## 2011-12-22 NOTE — Progress Notes (Signed)
2 Days Post-Op  Subjective: Extubated.  Looks well  Objective: Vital signs in last 24 hours: Temp:  [96.3 F (35.7 C)-99 F (37.2 C)] 98.2 F (36.8 C) (08/21 0400) Pulse Rate:  [29-105] 92  (08/21 0617) Resp:  [10-19] 11  (08/21 0617) BP: (112-134)/(43-59) 120/49 mmHg (08/21 0617) SpO2:  [96 %-100 %] 99 % (08/21 0617) FiO2 (%):  [30 %] 30 % (08/20 0913) Last BM Date:  (prior to admission)  Intake/Output from previous day: 08/20 0701 - 08/21 0700 In: 1845.9 [P.O.:60; I.V.:1775.9; IV Piggyback:10] Out: 1195 [Urine:825; Drains:370] Intake/Output this shift:    Incision/Wound:dressing dry.  T tube and JP  Intact.  No bile in JP.    CTA PULMONARY  Lab Results:   Basename 12/22/11 0600 12/21/11 0443  WBC 6.9 7.4  HGB 8.9* 9.5*  HCT 27.6* 30.2*  PLT 155 155   BMET  Basename 12/22/11 0600 12/21/11 1453  NA 132* 136  K 4.6 4.9  CL 104 112  CO2 23 19  GLUCOSE 189* 210*  BUN 36* 29*  CREATININE 2.39* 2.30*  CALCIUM 7.2* 6.9*   PT/INR No results found for this basename: LABPROT:2,INR:2 in the last 72 hours ABG  Basename 12/21/11 1445 12/21/11 0852  PHART 7.283* 7.237*  HCO3 17.6* 16.4*    Studies/Results: X-ray Chest Pa Or Ap  12/20/2011  *RADIOLOGY REPORT*  Clinical Data: Ventilator dependent respiratory failure.  CHEST - 1 VIEW  Comparison: 12/14/2011  Findings: There is been placement of an endotracheal tube, with tips seen approximately 4.5 cm above the carina.  Low lung volumes are seen.  Mild bilateral perihilar opacities seen which may be due to atelectasis or edema.  Peripheral lung fields are clear.  No evidence of pleural effusion.  Heart size is within normal limits.  IMPRESSION:  1.  Endotracheal tube in appropriate position. 2.  Low lung volumes with mild bilateral perihilar atelectasis versus edema.   Original Report Authenticated By: Danae Orleans, M.D.    Dg Cholangiogram Operative  12/20/2011  *RADIOLOGY REPORT*  Clinical data:  Retained bile duct  stones on the earlier intraoperative cholangiography.  The surgical procedure was turned into an open cholecystectomy with stone removal.  INTRAOPERATIVE CHOLANGIOGRAM  12/20/2011 1047 hours:  Comparison: Intraoperative cholangiogram earlier same date 0920 hours.  Findings:  A series of cholangiographic images from the C-arm fluoroscopic device were submitted for interpretation post- operatively.  A T-tube is now present in the common bile duct with excellent opacification of the common bile duct, common hepatic duct, and proximal intrahepatic ducts.  2 retained gallstones identified on the earlier cholangiogram are no longer visualized. There is now antegrade flow into the duodenum.  Please correlate with findings at real time fluoroscopy.  IMPRESSION: Interval removal of the 2 retained bile duct since the earlier cholangiogram.  No evidence of bile duct obstruction.  T-tube in place.  These images were submitted for radiologic interpretation only. Please see the procedural report for the amount of contrast and the fluoroscopy time utilized.   Original Report Authenticated By: Arnell Sieving, M.D. ( 12/20/2011 11:18:14 )    Dg Cholangiogram Operative  12/20/2011  *RADIOLOGY REPORT*  Clinical Data: Cholecystectomy for common duct stones  INTRAOPERATIVE CHOLANGIOGRAM  Comparison:  MRI 10/21/2011  Findings: Contrast is injected through the cystic duct remnant. The exam discloses to stones within the common duct. No contrast is seen entering the duodenum.  This could be due to a distal obstructing lesion or insufficient injection.  Slightly heterogeneous  filling of the distal common bile duct could be due to sludge or small stone material.  IMPRESSION: Two definite stones in the common bile duct.  Heterogeneous filling of the distal common duct.  See above.  No contrast seen entering the duodenum.   Original Report Authenticated By: Thomasenia Sales, M.D. ( 12/20/2011 09:54:35 )    Dg Chest Port 1  View  12/22/2011  *RADIOLOGY REPORT*  Clinical Data: Extubated and atelectasis.  PORTABLE CHEST - 1 VIEW  Comparison: 12/21/2011  Findings: The endotracheal tube has been removed.  There are low lung volumes with bilateral patchy linear densities.  Findings are suggestive for atelectasis.  Heart and mediastinum are stable. Negative for a pneumothorax.  IMPRESSION: Post extubation with low lung volumes and bilateral atelectasis.   Original Report Authenticated By: Richarda Overlie, M.D.    Dg Chest Port 1 View  12/21/2011  *RADIOLOGY REPORT*  Clinical Data: Intubation, ventilatory support, obstructive sleep apnea  PORTABLE CHEST - 1 VIEW  Comparison: 12/20/2011  Findings: Endotracheal tube 5.7 cm above the carina.  Persistent low lung volumes with basilar atelectasis.  Stable heart size and vascularity.  No enlarging effusion or pneumothorax.  IMPRESSION: Stable portable chest exam   Original Report Authenticated By: Judie Petit. Ruel Favors, M.D.     Anti-infectives: Anti-infectives     Start     Dose/Rate Route Frequency Ordered Stop   12/22/11 0815   ciprofloxacin (CIPRO) IVPB 400 mg        400 mg 200 mL/hr over 60 Minutes Intravenous Every 12 hours 12/22/11 0800     12/20/11 0630   ciprofloxacin (CIPRO) IVPB 400 mg        400 mg 200 mL/hr over 60 Minutes Intravenous 120 min pre-op 12/20/11 0617 12/20/11 0720          Assessment/Plan: s/p Procedure(s) (LRB): CHOLECYSTECTOMY WITH COMMON DUCT EXPLORATION (N/A) NEPHRECTOMY PARTIAL (Right) INTRAOPERATIVE CHOLANGIOGRAM () Continue foley due to strict I&O Transfer to floor OOB Advance diet as tolerated CRI  Follow for now Metabolic acidosis  Seems better.  Change IVF to D5 NS AT 100 cc/hr DM  II  Continue SSI for now. Add miralax for constipation prophylaxsis   LOS: 2 days    Shondra Capps A. 12/22/2011

## 2011-12-22 NOTE — Clinical Documentation Improvement (Signed)
RENAL FAILURE DOCUMENTATION CLARIFICATION QUERY  THIS DOCUMENT IS NOT A PERMANENT PART OF THE MEDICAL RECORD  TO RESPOND TO THE THIS QUERY, FOLLOW THE INSTRUCTIONS BELOW:  1. If needed, update documentation for the patient's encounter via the notes activity.  2. Access this query again and click edit on the In [employer].  3. After updating, or not, click F2 to complete all highlighted (required) fields concerning your review. Select "additional documentation in the medical record" OR "no additional documentation provided".  4. Click Sign note button.  5. The deficiency will fall out of your In Basket *Please let us know if you are not able to complete this workflow by phone or e-mail (listed below).  Please update your documentation within the medical record to reflect your response to this query.                                                                                    12/22/11  Dear Dr. Cyril Mourning / [facility]  In a better effort to capture your patient's severity of illness, reflect appropriate length of stay and utilization of resources, a review of the patient medical record has revealed the following indicators.    Based on your clinical judgment, please clarify and document in a progress note and/or discharge summary the clinical condition associated with the following supporting information:  In responding to this query please exercise your independent judgment.  The fact that a query is asked, does not imply that any particular answer is desired or expected.  Possible Clinical Conditions?   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    Per 8/19 progress note pt. Has RENAL INSUFFICIENCY,  CHRONIC.  Signs and Symptoms:   Diagnostics:  Lab:   Component     Latest Ref Rng 12/20/2011 12/20/2011 12/20/2011 12/20/2011 12/20/2011         2:56 PM  3:20 PM  3:30 PM  5:12 PM  6:00 PM  BUN     6 - 23 mg/dL       Creatinine     3.24 - 1.35 mg/dL     4.01 (H)   Component     Latest Ref Rng 12/20/2011 12/20/2011 12/21/2011 12/21/2011 12/21/2011         7:45 PM 11:40 PM  3:14 AM  4:43 AM  7:47 AM  BUN     6 - 23 mg/dL    23   Creatinine     0.50 - 1.35 mg/dL    0.27 (H)    Component     Latest Ref Rng 12/21/2011 12/21/2011 12/21/2011 12/21/2011 12/21/2011         8:52 AM 10:00 AM 11:30 AM  2:45 PM  2:53 PM  BUN     6 - 23 mg/dL     29 (H)  Creatinine     0.50 - 1.35 mg/dL     2.53 (H)   Component     Latest Ref Rng  12/21/2011 12/21/2011 12/21/2011 12/22/2011 12/22/2011         4:02 PM  7:12 PM 11:35 PM  3:34 AM  6:00 AM  BUN     6 - 23 mg/dL     36 (H)  Creatinine     0.50 - 1.35 mg/dL     1.61 (H)    Electrolytes:   Component     Latest Ref Rng 12/20/2011 12/20/2011 12/20/2011 12/20/2011         5:12 PM  6:00 PM  7:45 PM 11:40 PM  GFR calc non Af Amer     >90 mL/min  38 (L)    GFR calc Af Amer     >90 mL/min  44 (L)     Component     Latest Ref Rng 12/21/2011 12/21/2011 12/21/2011 12/21/2011         3:14 AM  4:43 AM  7:47 AM  8:52 AM  GFR calc non Af Amer     >90 mL/min  29 (L)    GFR calc Af Amer     >90 mL/min  34 (L)     Component     Latest Ref Rng 12/21/2011 12/21/2011 12/21/2011 12/21/2011        10:00 AM 11:30 AM  2:45 PM  2:53 PM  GFR calc non Af Amer     >90 mL/min    27 (L)  GFR calc Af Amer     >90 mL/min    31 (L)    Component     Latest Ref Rng 12/22/2011 12/22/2011 12/22/2011         6:00 AM  8:05 AM  8:24 AM  GFR calc non Af Amer     >90 mL/min 26 (L)    GFR calc Af Amer     >90 mL/min 30 (L)    :  Treatments:  Per 8/21 progress note keep hydrated/ hold further acei,  8/21 dc bicarb drip,  start oral bicarb.                      Reviewed:  additional documentation in the medical record in problem list  Thank You,  Shelda Pal RN, BSN, CCM   Clinical Documentation Specialist:  Pager [phone]  Health Information Management [place]

## 2011-12-22 NOTE — Evaluation (Signed)
Physical Therapy Evaluation Patient Details Name: Frank Ashley MRN: [id number] DOB: May 25, 1940 Today's Date: 12/22/2011 Time: 9147-8295 PT Time Calculation (min): 55 min  PT Assessment / Plan / Recommendation Clinical Impression  pt. was admitted 8/19 for extensive surgery of abdomen related to renal mass, s/p partial r nephrectomy> Pt. had been placed in chair position since surgery. Pt. c/o dizziness upon sitting at edge of bed,which worsened. Returned to supine. Pt. will benefit from PT to improve functional mobility and strengthehing to prepare to DC to next facility. Pt. reports wife will not be able to asssit so pt. will need to be Modified Indepent to DC home. Pt. may be a candidat for LTAC or SNF.    PT Assessment  Patient needs continued PT services    Follow Up Recommendations  LTACH (SNF if not LTACH)    Barriers to Discharge Decreased caregiver support      Equipment Recommendations  Rolling walker with 5" wheels (wide)    Recommendations for Other Services OT consult   Frequency Min 3X/week    Precautions / Restrictions Precautions Precaution Comments: dizziness first time sitting, pt.'s size and 2 drains on R and ostomy drain mid line/   Pertinent Vitals/Pain HR 104, sats 100% 2l, BP 159/49, pt c/o dizziness while sitting on edge, first time since 6-7 hour surgery 8/19.      Mobility  Bed Mobility Bed Mobility: Rolling Right;Right Sidelying to Sit;Sitting - Scoot to [place] of Bed;Sit to Sidelying Right;Scooting to [facility] Rolling Right: 3: Mod assist;With rail Right Sidelying to Sit: 1: +2 Total assist;HOB elevated (HOB at 50 ) Right Sidelying to Sit: Patient Percentage: 30% Sitting - Scoot to Edge of Bed: 4: Min assist;With rail Sit to Sidelying Right: 1: +2 Total assist Sit to Sidelying Right: Patient Percentage: 40% Scooting to HOB: 4: Min guard;With rail Details for Bed Mobility Assistance: Pt required lifting assistance to right trunk from sidelying. Pillow  placed over abdomen for support. pt assited back to bed due to increasing c/o dizziness and appeared to be so. eyes not focussing Transfers Details for Transfer Assistance: Not tested due to dizziness and risks    Exercises     PT Diagnosis: Difficulty walking;Acute pain  PT Problem List: Decreased strength;Decreased range of motion;Decreased activity tolerance;Decreased mobility;Cardiopulmonary status limiting activity;Decreased knowledge of use of DME;Obesity;Pain;Decreased knowledge of precautions PT Treatment Interventions: DME instruction;Gait training;Stair training;Functional mobility training;Therapeutic activities;Therapeutic exercise;Patient/family education   PT Goals Acute Rehab PT Goals PT Goal Formulation: With patient Time For Goal Achievement: 01/05/12 Potential to Achieve Goals: Good Pt will go Supine/Side to Sit: Independently;with HOB 0 degrees PT Goal: Supine/Side to Sit - Progress: Goal set today Pt will go Sit to Supine/Side: with HOB 0 degrees Pt will go Sit to Stand: with supervision PT Goal: Sit to Stand - Progress: Goal set today Pt will go Stand to Sit: with supervision PT Goal: Stand to Sit - Progress: Goal set today Pt will Ambulate: 51 - 150 feet;with least restrictive assistive device;with supervision PT Goal: Ambulate - Progress: Goal set today Pt will Go Up / Down Stairs: 3-5 stairs;with supervision;with least restrictive assistive device PT Goal: Up/Down Stairs - Progress: Goal set today  Visit Information  Last PT Received On: 12/22/11 Assistance Needed: +2 (may take 3 due to dizziness and drains and safety)    Subjective Data  Subjective: I feel dizzy. I don't want to try to stand just yet Patient Stated Goal: to go home   Prior Functioning  Home  Living Lives With: Spouse Available Help at Discharge: Family Type of Home: House Home Access: Stairs to enter [employer] of Steps: 3 Entrance Stairs-Rails: None Home Layout: Two  level;Able to live on main level with bedroom/bathroom Bathroom Toilet: Standard Home Adaptive Equipment: None Additional Comments: wife will not be able to asssit physically Prior Function Level of Independence: Independent Able to Take Stairs?: Yes Driving: Yes Comments: has been at home with dtressing changes PTA    Cognition  Overall Cognitive Status: Appears within functional limits for tasks assessed/performed Arousal/Alertness: Awake/alert Orientation Level: Appears intact for tasks assessed Behavior During Session: [facility] for tasks performed    Extremity/Trunk Assessment Right Upper Extremity Assessment RUE ROM/Strength/Tone: [facility] for tasks assessed RUE Sensation: WFL - Light Touch Left Upper Extremity Assessment LUE ROM/Strength/Tone: WFL for tasks assessed LUE Sensation: WFL - Light Touch Right Lower Extremity Assessment RLE ROM/Strength/Tone: WFL for tasks assessed RLE Sensation: WFL - Light Touch Left Lower Extremity Assessment LLE ROM/Strength/Tone: WFL for tasks assessed LLE Sensation: WFL - Light Touch   Balance Static Sitting Balance Static Sitting - Balance Support: Feet supported;Bilateral upper extremity supported Static Sitting - Level of Assistance: 5: Stand by assistance Static Sitting - Comment/# of Minutes: x 8 minutes  End of Session PT - End of Session Activity Tolerance: Treatment limited secondary to medical complications (Comment);Patient limited by fatigue;Patient limited by pain Patient left: with call bell/phone within reach Nurse Communication: Mobility status;Need for lift equipment, and pt's dizziness  GP     Rada Hay 12/22/2011, 10:27 AM  [phone]

## 2011-12-22 NOTE — Progress Notes (Signed)
Name: Frank Ashley MRN: [id number] DOB: Sep 23, 1940    LOS: 2  Referring Provider:  Cornett  Reason for Referral:  Post op resp failure/ re-tubed in PACU for poor resp effort  PULMONARY / CRITICAL CARE MEDICINE  HPI:  37 yowm on ACEI with min remote smoking hx very sedentary but not limited by sob sp Open cholecystectomy with common bile duct expiration and removal of common bile duct stones with intraoperative cholangiogram and T-tube cholangiogram/choledocoscopy and Right partial nephrectomy  on 8/19 and required re-et in pacu for poor resp effort, no obvious aspiration or excess secretions > tx to ICU and PCCM service requested   nursing reports difficult re-et but no stridor p extubation  Does have h/o osa but not using cpap x 10y and no excess hypersomnolence noted by fm   Current Status: Interactive, more anxious last hr, denies pain  Vital Signs: Temp:  [96.3 F (35.7 C)-99 F (37.2 C)] 98.2 F (36.8 C) (08/21 0400) Pulse Rate:  [29-105] 92  (08/21 0617) Resp:  [10-19] 11  (08/21 0617) BP: (112-134)/(43-59) 120/49 mmHg (08/21 0617) SpO2:  [96 %-100 %] 99 % (08/21 0617) FiO2 (%):  [30 %] 30 % (08/20 0913)  Physical Examination: General: wide awake , speech clear HEENT extubated Neck supple No jvd Lungs with distant bs RRR NSR no s3  abd soft, post op, dressings dry and drain with bloody drainage Ext Post pas, no edema        Active Problems:  DM w/o complication type II  HYPERLIPIDEMIA  Overweight  GERD  RENAL INSUFFICIENCY, CHRONIC  SLEEP APNEA  Acute respiratory failure   ASSESSMENT AND PLAN  PULMONARY  Lab 12/21/11 1445 12/21/11 0852 12/20/11 1712 12/20/11 1530 12/20/11 1350  PHART 7.283* 7.237* 7.206* 7.022* 7.296*  PCO2ART 38.4 39.9 41.4 79.9* 32.7*  PO2ART 72.3* 87.0 117.0* 316.0* 210.0*  HCO3 17.6* 16.4* 15.8* 18.9* 15.6*  O2SAT 93.6 95.8 97.5 98.4 98.7   Ventilator Settings: Vent Mode:  [-] PSV FiO2 (%):  [30 %] 30 % PEEP:  [10  cmH20] 10 cmH20 Pressure Support:  [5 cmH20] 5 cmH20 CXR:  No new infx ETT:  8/19 >>>8/20  A: Acute resp failure/ hypercarbia post op on ACEI pre op but no stridor reported - likely sedation/ poor abd compliance main problems and underlying OSA with narcotics.  P  extubated 8/20.        Use cpap to tx underlying OSA during sleep - concern that he will be on PCA x 24-48h 8/21 cpap ordered but not uses. Staff instructed to applycpap nocturnally as he uses cpap at home   CARDIOVASCULAR  Lab 12/21/11 1000  TROPONINI --  LATICACIDVEN 2.3*  PROBNP --     Lines: R Art line RA 8/19>>>8/21  A: Hemodynamics stable  RENAL  Lab 12/22/11 0600 12/21/11 1453 12/21/11 0443 12/20/11 1800 12/20/11 1342 12/20/11 1242  NA 132* 136 135 -- 139 143  K 4.6 4.9 -- -- -- --  CL 104 112 113* -- -- --  CO2 23 19 17* -- -- --  BUN 36* 29* 23 -- -- --  CREATININE 2.39* 2.30* 2.17* 1.74* -- --  CALCIUM 7.2* 6.9* 6.7* -- -- --  MG -- -- -- -- -- --  PHOS -- -- -- -- -- --   Intake/Output      08/20 0701 - 08/21 0700 08/21 0701 - 08/22 0700   P.O. 60    I.V. (mL/kg) 1775.9 (13.1)    Blood  IV Piggyback 10    Total Intake(mL/kg) 1845.9 (13.6)    Urine (mL/kg/hr) 825 (0.3)    Drains 370    Blood     Total Output 1195    Net +650.9          Foley:  8/19  A:  CRI on ACEI,  s/p right partial nephrectomy  8/19 Lab Results  Component Value Date   CREATININE 2.39* 12/22/2011   CREATININE 2.30* 12/21/2011   CREATININE 2.17* 12/21/2011    P Keep hydrated/ hold further acei         8/21 dc bicarb drip     Start oral bicarb    GASTROINTESTINAL  Lab 12/22/11 0600  AST 118*  ALT 46  ALKPHOS 35*  BILITOT 0.9  PROT 4.7*  ALBUMIN 2.1*    A:  No issues Start PO per CCS  HEMATOLOGIC  Lab 12/22/11 0600 12/21/11 0443 12/20/11 1800 12/20/11 1520 12/20/11 1342  HGB 8.9* 9.5* 10.6* 11.0* 9.5*  HCT 27.6* 30.2* 33.0* 34.8* 28.0*  PLT 155 155 180 -- --  INR -- -- -- -- --  APTT -- -- --  -- --   A: acute blood loss anemia s/p 2 units prbc's intraop 8/19 P:  Threshold to tx hgb < 7 unless obvious acute bleeding   INFECTIOUS  Lab 12/22/11 0600 12/21/11 0443 12/20/11 1800  WBC 6.9 7.4 7.6  PROCALCITON -- -- --   Cultures:   Antibiotics:  Ancef pre op 8/19 Cipro pre op 8/19  A:  No obvious infection cipro x 5ds  per urology/CCS  ENDOCRINE  Lab 12/22/11 0334 12/21/11 2335 12/21/11 1912 12/21/11 1602 12/21/11 1130  GLUCAP 188* 209* 225* 185* 162*   A:  AODM P  SSI  NEUROLOGIC  A: Intact R/o hypothyroid > tsh sent 8/19(.807)  BEST PRACTICE / DISPOSITION Level of Care:  ICU Primary Service:  CCS Consultants:  PCCM/urology Code Status:  Full Diet:  NPO DVT Px:  PAS GI Px:  ppi  Skin Integrity:  OK Social / Family:    Brett Canales Minor ACNP Adolph Pollack PCCM Pager [phone] till 3 pm If no answer page [phone]  12/22/2011, 8:26 AM  Independently examined pt, evaluated data & formulated above care plan with NP  ALVA,RAKESH V. [phone]

## 2011-12-22 NOTE — Progress Notes (Signed)
Set up patient on Auto CPAP with full face mask and 1 liter of oxygen bled in.

## 2011-12-23 DIAGNOSIS — N179 Acute kidney failure, unspecified: Secondary | ICD-10-CM

## 2011-12-23 LAB — COMPREHENSIVE METABOLIC PANEL
ALT: 40 U/L (ref 0–53)
Albumin: 2.1 g/dL — ABNORMAL LOW (ref 3.5–5.2)
Alkaline Phosphatase: 40 U/L (ref 39–117)
BUN: 34 mg/dL — ABNORMAL HIGH (ref 6–23)
Chloride: 101 mEq/L (ref 96–112)
Glucose, Bld: 223 mg/dL — ABNORMAL HIGH (ref 70–99)
Potassium: 4.1 mEq/L (ref 3.5–5.1)
Sodium: 132 mEq/L — ABNORMAL LOW (ref 135–145)
Total Bilirubin: 1.4 mg/dL — ABNORMAL HIGH (ref 0.3–1.2)

## 2011-12-23 LAB — CBC
HCT: 27.8 % — ABNORMAL LOW (ref 39.0–52.0)
Hemoglobin: 9 g/dL — ABNORMAL LOW (ref 13.0–17.0)
MCV: 77.9 fL — ABNORMAL LOW (ref 78.0–100.0)
RDW: 17.1 % — ABNORMAL HIGH (ref 11.5–15.5)
WBC: 2.3 10*3/uL — ABNORMAL LOW (ref 4.0–10.5)

## 2011-12-23 LAB — GLUCOSE, CAPILLARY
Glucose-Capillary: 130 mg/dL — ABNORMAL HIGH (ref 70–99)
Glucose-Capillary: 199 mg/dL — ABNORMAL HIGH (ref 70–99)
Glucose-Capillary: 215 mg/dL — ABNORMAL HIGH (ref 70–99)

## 2011-12-23 MED ORDER — INSULIN ASPART PROT & ASPART (70-30 MIX) 100 UNIT/ML ~~LOC~~ SUSP
40.0000 [IU] | Freq: Two times a day (BID) | SUBCUTANEOUS | Status: DC
Start: 2011-12-23 — End: 2011-12-30
  Administered 2011-12-23 – 2011-12-28 (×5): 40 [IU] via SUBCUTANEOUS
  Filled 2011-12-23: qty 3

## 2011-12-23 MED ORDER — SODIUM CHLORIDE 0.45 % IV SOLN
INTRAVENOUS | Status: DC
  Administered 2011-12-23 – 2011-12-24 (×2): 50 mL/h via INTRAVENOUS

## 2011-12-23 MED ORDER — DEXTROSE 5 % IV SOLN
1.0000 g | Freq: Three times a day (TID) | INTRAVENOUS | Status: DC
  Administered 2011-12-23 – 2011-12-31 (×24): 1 g via INTRAVENOUS
  Filled 2011-12-23 (×27): qty 1

## 2011-12-23 MED ORDER — ENOXAPARIN SODIUM 40 MG/0.4ML ~~LOC~~ SOLN
40.0000 mg | SUBCUTANEOUS | Status: DC
  Administered 2011-12-23 – 2012-01-05 (×14): 40 mg via SUBCUTANEOUS
  Filled 2011-12-23 (×19): qty 0.4

## 2011-12-23 MED ORDER — HYDROMORPHONE HCL PF 1 MG/ML IJ SOLN
1.0000 mg | INTRAMUSCULAR | Status: DC | PRN
  Administered 2011-12-23 – 2011-12-25 (×14): 1 mg via INTRAVENOUS
  Filled 2011-12-23 (×16): qty 1

## 2011-12-23 MED ORDER — HYDROMORPHONE HCL 2 MG PO TABS
2.0000 mg | ORAL_TABLET | ORAL | Status: DC | PRN
  Administered 2011-12-24 – 2012-01-01 (×13): 2 mg via ORAL
  Filled 2011-12-23 (×13): qty 1

## 2011-12-23 MED ORDER — BISACODYL 10 MG RE SUPP
10.0000 mg | Freq: Every day | RECTAL | Status: DC | PRN
  Filled 2011-12-23 (×2): qty 1

## 2011-12-23 NOTE — Progress Notes (Signed)
Name: Frank Ashley MRN: [id number] DOB: [birth year]    LOS: 3  Referring Provider:  Cornett  Reason for Referral:  Post op resp failure/ re-tubed in PACU for poor resp effort  PULMONARY / CRITICAL CARE MEDICINE  HPI:  73 yowm on ACEI with min remote smoking hx very sedentary but not limited by sob sp Open cholecystectomy with common bile duct expiration and removal of common bile duct stones with intraoperative cholangiogram and T-tube cholangiogram/choledocoscopy and Right partial nephrectomy  on 8/19 and required re-et in pacu for poor resp effort, no obvious aspiration or excess secretions > tx to ICU and PCCM service requested   nursing reports difficult re-et but no stridor p extubation  Does have h/o osa but not using cpap x 10y and no excess hypersomnolence noted by fm   Current Status: C/o gas pain, oob to chair Slept with cpap -no probs  Vital Signs: Temp:  [97.6 F (36.4 C)-98.9 F (37.2 C)] 98.4 F (36.9 C) (08/22 0400) Pulse Rate:  [86-109] 86  (08/22 0600) Resp:  [12-21] 14  (08/22 0600) BP: (121-143)/(44-58) 127/55 mmHg (08/22 0600) SpO2:  [91 %-99 %] 98 % (08/22 0600) Weight:  [306 lb 3.5 oz (138.9 kg)] 306 lb 3.5 oz (138.9 kg) (08/22 0000)  Physical Examination: General: wide awake , speech clear, sitting in chair Neck supple No jvd Lungs with distant bs RRR NSR no s3  abd soft,obese, +bs, +flatus,  post op, dressings dry and drain with bloody drainage Ext Post pas, no edema        Active Problems:  DM w/o complication type II  HYPERLIPIDEMIA  Overweight  GERD  RENAL INSUFFICIENCY, CHRONIC  SLEEP APNEA  Acute respiratory failure  Acute renal failure   ASSESSMENT AND PLAN  PULMONARY  Lab 12/21/11 1445 12/21/11 0852 12/20/11 1712 12/20/11 1530 12/20/11 1350  PHART 7.283* 7.237* 7.206* 7.022* 7.296*  PCO2ART 38.4 39.9 41.4 79.9* 32.7*  PO2ART 72.3* 87.0 117.0* 316.0* 210.0*  HCO3 17.6* 16.4* 15.8* 18.9* 15.6*  O2SAT 93.6 95.8 97.5 98.4  98.7      CXR:  Dg Chest Port 1 View  12/22/2011  bibasal atx    ETT:  8/19 >>>8/20  A: Acute resp failure/ hypercarbia post op on ACEI pre op but no stridor reported - likely sedation/ poor abd compliance main problems and underlying OSA with narcotics.  P  extubated 8/20.        Use cpap to tx underlying OSA during sleep & provide on discharge  CARDIOVASCULAR  Lab 12/21/11 1000  TROPONINI --  LATICACIDVEN 2.3*  PROBNP --     Lines: R Art line RA 8/19>>>8/21  A: Hemodynamics stable  RENAL  Lab 12/23/11 0315 12/22/11 0600 12/21/11 1453 12/21/11 0443 12/20/11 1800 12/20/11 1342  NA 132* 132* 136 135 -- 139  K 4.1 4.6 -- -- -- --  CL 101 104 112 113* -- --  CO2 23 23 19  17* -- --  BUN 34* 36* 29* 23 -- --  CREATININE 1.92* 2.39* 2.30* 2.17* 1.74* --  CALCIUM 7.6* 7.2* 6.9* 6.7* -- --  MG 2.0 1.8 -- -- -- --  PHOS 2.2* 2.9 -- -- -- --   Intake/Output      08/21 0701 - 08/22 0700 08/22 0701 - 08/23 0700   P.O.     I.V. (mL/kg) 1140.8 (8.2)    Other 30    IV Piggyback 400    Total Intake(mL/kg) 1570.8 (11.3)    Urine (mL/kg/hr) 1105 (  0.3)    Drains 600    Total Output 1705    Net -134.2          Foley:  8/19  A:  CRI on ACEI,  s/p right partial nephrectomy  8/19 Lab Results  Component Value Date   CREATININE 1.92* 12/23/2011   CREATININE 2.39* 12/22/2011   CREATININE 2.30* 12/21/2011    P Keep hydrated/ hold further acei         8/21 dc bicarb drip     Started oral bicarb 8/21 - can  dc with renal function    GASTROINTESTINAL  Lab 12/23/11 0315 12/22/11 0600  AST 58* 118*  ALT 40 46  ALKPHOS 40 35*  BILITOT 1.4* 0.9  PROT 5.1* 4.7*  ALBUMIN 2.1* 2.1*    A:  No issues Started PO per CCS 8/21  HEMATOLOGIC  Lab 12/23/11 0315 12/22/11 0600 12/21/11 0443 12/20/11 1800 12/20/11 1520  HGB 9.0* 8.9* 9.5* 10.6* 11.0*  HCT 27.8* 27.6* 30.2* 33.0* 34.8*  PLT 178 155 155 180 --  INR -- -- -- -- --  APTT -- -- -- -- --   A: acute blood loss  anemia s/p 2 units prbc's intraop 8/19 P:  Threshold to tx hgb < 7 unless obvious acute bleeding   INFECTIOUS  Lab 12/23/11 0315 12/22/11 0600 12/21/11 0443 12/20/11 1800  WBC 2.3* 6.9 7.4 7.6  PROCALCITON -- -- -- --   Cultures:   Antibiotics:  Ancef pre op 8/19 Cipro pre op 8/19  A:  No obvious infection Cefoxitin  Until 8/27  per urology/CCS Follow wc - sudden drop  ENDOCRINE  Lab 12/23/11 0319 12/22/11 2353 12/22/11 1946 12/22/11 1610 12/22/11 1157  GLUCAP 211* 199* 165* 152* 154*   A:  AODM P  SSI      8/21 dc d5ns and change to ns at 50 cc/hr  NEUROLOGIC  A: Intact R/o hypothyroid > tsh sent 8/19(.807) Resume oral xanax for anxiety Dc PCA   BEST PRACTICE / DISPOSITION Level of Care:  ICU/floor ordered Primary Service:  CCS Consultants:  PCCM/urology Code Status:  Full Diet:  NPO DVT Px:  PAS GI Px:  ppi  Skin Integrity:  OK Social / Family:    Brett Canales Minor ACNP Adolph Pollack PCCM Pager [phone] till 3 pm If no answer page [phone]  12/23/2011, 8:17 AM  PCCM to sign off,  arranged outpt FU for OSA Have asked care management to arrange for  autoCPAP 5-15 on discharge  Heena Woodbury V.  [phone]

## 2011-12-23 NOTE — Progress Notes (Signed)
3 Days Post-Op  Subjective: Full of gas  Objective: Vital signs in last 24 hours: Temp:  [97.6 F (36.4 C)-98.9 F (37.2 C)] 98.4 F (36.9 C) (08/22 0400) Pulse Rate:  [86-109] 86  (08/22 0600) Resp:  [12-21] 14  (08/22 0600) BP: (121-143)/(44-58) 127/55 mmHg (08/22 0600) SpO2:  [91 %-99 %] 98 % (08/22 0600) Weight:  [306 lb 3.5 oz (138.9 kg)] 306 lb 3.5 oz (138.9 kg) (08/22 0000) Last BM Date:  (prior to admission)  Intake/Output from previous day: 08/21 0701 - 08/22 0700 In: 1570.8 [I.V.:1140.8; IV Piggyback:400] Out: 1705 [Urine:1105; Drains:600] Intake/Output this shift:    Incision/Wound:wound intact. JP no bile serous  T tube bile drainage minimal    Lab Results:   Basename 12/23/11 0315 12/22/11 0600  WBC 2.3* 6.9  HGB 9.0* 8.9*  HCT 27.8* 27.6*  PLT 178 155   BMET  Basename 12/23/11 0315 12/22/11 0600  NA 132* 132*  K 4.1 4.6  CL 101 104  CO2 23 23  GLUCOSE 223* 189*  BUN 34* 36*  CREATININE 1.92* 2.39*  CALCIUM 7.6* 7.2*   PT/INR No results found for this basename: LABPROT:2,INR:2 in the last 72 hours ABG  Basename 12/21/11 1445 12/21/11 0852  PHART 7.283* 7.237*  HCO3 17.6* 16.4*    Studies/Results: Dg Chest Port 1 View  12/22/2011  *RADIOLOGY REPORT*  Clinical Data: Extubated and atelectasis.  PORTABLE CHEST - 1 VIEW  Comparison: 12/21/2011  Findings: The endotracheal tube has been removed.  There are low lung volumes with bilateral patchy linear densities.  Findings are suggestive for atelectasis.  Heart and mediastinum are stable. Negative for a pneumothorax.  IMPRESSION: Post extubation with low lung volumes and bilateral atelectasis.   Original Report Authenticated By: Richarda Overlie, M.D.    Dg Chest Port 1 View  12/21/2011  *RADIOLOGY REPORT*  Clinical Data: Intubation, ventilatory support, obstructive sleep apnea  PORTABLE CHEST - 1 VIEW  Comparison: 12/20/2011  Findings: Endotracheal tube 5.7 cm above the carina.  Persistent low lung volumes  with basilar atelectasis.  Stable heart size and vascularity.  No enlarging effusion or pneumothorax.  IMPRESSION: Stable portable chest exam   Original Report Authenticated By: Judie Petit. Ruel Favors, M.D.     Anti-infectives: Anti-infectives     Start     Dose/Rate Route Frequency Ordered Stop   12/22/11 0900   ciprofloxacin (CIPRO) IVPB 400 mg  Status:  Discontinued        400 mg 200 mL/hr over 60 Minutes Intravenous Every 12 hours 12/22/11 0800 12/23/11 0732   12/20/11 0630   ciprofloxacin (CIPRO) IVPB 400 mg        400 mg 200 mL/hr over 60 Minutes Intravenous 120 min pre-op 12/20/11 0617 12/20/11 0720          Assessment/Plan: s/p Procedure(s) (LRB): CHOLECYSTECTOMY WITH COMMON DUCT EXPLORATION (N/A) NEPHRECTOMY PARTIAL (Right) INTRAOPERATIVE CHOLANGIOGRAM () Follow WBC  Recheck in AM.  Change from cipro to cefoxitin for a total of 5 more days given T tube and history of chronic cholecystitis CRI  Better  D/c foley Friday Clamp T tube DM II Cont SSI until po intake better.  LOS: 3 days    Leisel Pinette A. 12/23/2011

## 2011-12-23 NOTE — Progress Notes (Signed)
Pt placed on cpap auto titration mode 5-15 cmH2O with 2L O2 bled in. Pt said he wore last night on these settings & it felt fine, SpO2-99%.  Jacqulynn Cadet RRT

## 2011-12-23 NOTE — Progress Notes (Signed)
Physical Therapy Treatment Patient Details Name: Frank Ashley MRN: [id number] DOB: Sep 14, 1940 Today's Date: 12/23/2011 Time: 0832-0905 PT Time Calculation (min): 33 min  PT Assessment / Plan / Recommendation Comments on Treatment Session  Pt is more lethargic today, also in a lot of pain/ used PCA multiple times. Pt. was able to stand x 2 at [facility] and practice step in place. Pt. also c/o gas. continue PT.    Follow Up Recommendations  LTACH    Barriers to Discharge        Equipment Recommendations  Rolling walker with 5" wheels    Recommendations for Other Services OT consult  Frequency Min 3X/week   Plan Discharge plan remains appropriate;Frequency remains appropriate    Precautions / Restrictions Precautions Precaution Comments: abdominal drains Restrictions Weight Bearing Restrictions: No   Pertinent Vitals/Pain C/o 10 pain, has PCA 89 97% 126/40 does c/o some dizziness.    Mobility  Transfers Transfers: Sit to Stand;Stand to Sit Sit to Stand: 1: +2 Total assist;With upper extremity assist;From chair/3-in-1;With armrests Sit to Stand: Patient Percentage: 60% Stand to Sit: 1: +2 Total assist;With armrests;With upper extremity assist;To chair/3-in-1 Stand to Sit: Patient Percentage: 60% Details for Transfer Assistance: Pt was able to scoot himself to the edge of the chair in prep for standing up at RW, VC for use of armrests to push up/reach back.stood x 2 trials. Ambulation/Gait Ambulation/Gait Assistance: 1: +2 Total assist Ambulation/Gait: Patient Percentage: 60% Assistive device: Rolling walker Ambulation/Gait Assistance Details: Pt too lethargic to safely attempt to ambulate, noted legs tending to buckle. Pt. practice small steps in place at [facility] for about 10 steps first trial and 5 steps 2nd trial. Pt weaker 2nd trial/more lethargic Gait Pattern: Trunk flexed;Decreased stance time - left;Decreased stance time - right    Exercises     PT Diagnosis:    PT Problem  List:   PT Treatment Interventions:     PT Goals Acute Rehab PT Goals Pt will go Supine/Side to Sit: Independently;with HOB 0 degrees Pt will go Sit to Supine/Side: Independently;with HOB 0 degrees PT Goal: Sit to Supine/Side - Progress: Goal set today Pt will go Sit to Stand: with supervision;with upper extremity assist PT Goal: Sit to Stand - Progress: Progressing toward goal Pt will go Stand to Sit: with supervision PT Goal: Stand to Sit - Progress: Progressing toward goal Pt will Ambulate: 51 - 150 feet;with least restrictive assistive device;with supervision PT Goal: Ambulate - Progress: Progressing toward goal  Visit Information  Last PT Received On: 12/23/11 Assistance Needed: +2    Subjective Data  Subjective: Why am I so sleepy, I hurt bad.    Cognition  Overall Cognitive Status: Appears within functional limits for tasks assessed/performed Arousal/Alertness: Lethargic Orientation Level: Appears intact for tasks assessed Behavior During Session: Lethargic Cognition - Other Comments: much more lethargic today but able to participate in standing    Balance  Static Sitting Balance Static Sitting - Balance Support: Bilateral upper extremity supported;Feet supported Static Sitting - Level of Assistance: 5: Stand by assistance  End of Session PT - End of Session Activity Tolerance: Patient limited by fatigue;Patient limited by pain;Treatment limited secondary to medication Patient left: in chair;with call bell/phone within reach Nurse Communication: Mobility status;Need for lift equipment   GP     Rada Hay 12/23/2011, 11:07 AM [phone]

## 2011-12-23 NOTE — Progress Notes (Signed)
Patient ID: Frank Ashley, male   DOB: 1940/05/31, 71 y.o.   MRN: [id number]  I saw patient earlier this afternoon. He was napping in the in the chair. He hasn't been ambulating.   JP Cr normal with minimal output.  Serum Cr headed back to baseline.   Stable from GU point of view.   Will follow. Foley out tomorrow.

## 2011-12-24 ENCOUNTER — Encounter (HOSPITAL_COMMUNITY): Payer: Self-pay | Admitting: *Deleted

## 2011-12-24 ENCOUNTER — Inpatient Hospital Stay (HOSPITAL_COMMUNITY): Payer: Medicare Other

## 2011-12-24 ENCOUNTER — Encounter (HOSPITAL_COMMUNITY): Payer: Self-pay | Admitting: Radiology

## 2011-12-24 ENCOUNTER — Encounter (HOSPITAL_COMMUNITY)
Admission: RE | Disposition: A | Discharge status: Home Health | Payer: Self-pay | Source: Ambulatory Visit | Attending: Surgery

## 2011-12-24 ENCOUNTER — Inpatient Hospital Stay (HOSPITAL_COMMUNITY): Payer: Medicare Other | Admitting: *Deleted

## 2011-12-24 DIAGNOSIS — K650 Generalized (acute) peritonitis: Secondary | ICD-10-CM

## 2011-12-24 DIAGNOSIS — T85898A Other specified complication of other internal prosthetic devices, implants and grafts, initial encounter: Secondary | ICD-10-CM

## 2011-12-24 HISTORY — PX: INTRAOPERATIVE CHOLANGIOGRAM: SHX5230

## 2011-12-24 HISTORY — PX: LAPAROTOMY: SHX154

## 2011-12-24 LAB — COMPREHENSIVE METABOLIC PANEL
AST: 30 U/L (ref 0–37)
Albumin: 2.1 g/dL — ABNORMAL LOW (ref 3.5–5.2)
Alkaline Phosphatase: 48 U/L (ref 39–117)
BUN: 32 mg/dL — ABNORMAL HIGH (ref 6–23)
CO2: 20 mEq/L (ref 19–32)
Calcium: 7.8 mg/dL — ABNORMAL LOW (ref 8.4–10.5)
Creatinine, Ser: 1.7 mg/dL — ABNORMAL HIGH (ref 0.50–1.35)
Glucose, Bld: 114 mg/dL — ABNORMAL HIGH (ref 70–99)
Sodium: 133 mEq/L — ABNORMAL LOW (ref 135–145)
Total Bilirubin: 1.3 mg/dL — ABNORMAL HIGH (ref 0.3–1.2)
Total Protein: 5.2 g/dL — ABNORMAL LOW (ref 6.0–8.3)

## 2011-12-24 LAB — TYPE AND SCREEN
ABO/RH(D): B POS
Antibody Screen: NEGATIVE
Unit division: 0
Unit division: 0
Unit division: 0
Unit division: 0
Unit division: 0
Unit division: 0

## 2011-12-24 LAB — GLUCOSE, CAPILLARY
Glucose-Capillary: 102 mg/dL — ABNORMAL HIGH (ref 70–99)
Glucose-Capillary: 115 mg/dL — ABNORMAL HIGH (ref 70–99)

## 2011-12-24 LAB — CBC
Hemoglobin: 9.3 g/dL — ABNORMAL LOW (ref 13.0–17.0)
MCH: 25.3 pg — ABNORMAL LOW (ref 26.0–34.0)
MCHC: 32.7 g/dL (ref 30.0–36.0)
MCV: 77.2 fL — ABNORMAL LOW (ref 78.0–100.0)

## 2011-12-24 LAB — CREATININE, FLUID (PLEURAL, PERITONEAL, JP DRAINAGE): Creat, Fluid: 1.7 mg/dL

## 2011-12-24 SURGERY — LAPAROTOMY, EXPLORATORY
Anesthesia: General | Site: Abdomen

## 2011-12-24 MED ORDER — PROMETHAZINE HCL 25 MG/ML IJ SOLN: 6.2500 mg | INTRAMUSCULAR | Status: DC | PRN

## 2011-12-24 MED ORDER — LACTATED RINGERS IV SOLN: INTRAVENOUS | Status: DC

## 2011-12-24 MED ORDER — DEXTROSE 5 % IV SOLN
3.0000 g | INTRAVENOUS | Status: DC | PRN
  Administered 2011-12-24: 3 g via INTRAVENOUS

## 2011-12-24 MED ORDER — VANCOMYCIN HCL 1000 MG IV SOLR
1500.0000 mg | INTRAVENOUS | Status: DC
  Filled 2011-12-24: qty 1500

## 2011-12-24 MED ORDER — SUCCINYLCHOLINE CHLORIDE 20 MG/ML IJ SOLN
INTRAMUSCULAR | Status: DC | PRN
Start: 2011-12-24 — End: 2011-12-24
  Administered 2011-12-24: 180 mg via INTRAVENOUS

## 2011-12-24 MED ORDER — SODIUM CHLORIDE 0.9 % IR SOLN
Status: DC | PRN
  Administered 2011-12-24: 2000 mL

## 2011-12-24 MED ORDER — ROCURONIUM BROMIDE 100 MG/10ML IV SOLN
INTRAVENOUS | Status: DC | PRN
  Administered 2011-12-24: 35 mg via INTRAVENOUS
  Administered 2011-12-24: 5 mg via INTRAVENOUS

## 2011-12-24 MED ORDER — FENTANYL CITRATE 0.05 MG/ML IJ SOLN
25.0000 ug | INTRAMUSCULAR | Status: DC | PRN
  Administered 2011-12-24: 25 ug via INTRAVENOUS
  Administered 2011-12-24: 50 ug via INTRAVENOUS
  Administered 2011-12-24 (×3): 25 ug via INTRAVENOUS

## 2011-12-24 MED ORDER — IOHEXOL 300 MG/ML  SOLN
INTRAMUSCULAR | Status: DC | PRN
  Administered 2011-12-24: 15 mL

## 2011-12-24 MED ORDER — PROPOFOL 10 MG/ML IV BOLUS
INTRAVENOUS | Status: DC | PRN
  Administered 2011-12-24: 180 mg via INTRAVENOUS

## 2011-12-24 MED ORDER — GLYCOPYRROLATE 0.2 MG/ML IJ SOLN
INTRAMUSCULAR | Status: DC | PRN
  Administered 2011-12-24: 0.6 mg via INTRAVENOUS

## 2011-12-24 MED ORDER — LACTATED RINGERS IV SOLN
INTRAVENOUS | Status: DC | PRN
  Administered 2011-12-24: 15:00:00 via INTRAVENOUS

## 2011-12-24 MED ORDER — NEOSTIGMINE METHYLSULFATE 1 MG/ML IJ SOLN
INTRAMUSCULAR | Status: DC | PRN
  Administered 2011-12-24: 5 mg via INTRAVENOUS

## 2011-12-24 MED ORDER — LIDOCAINE HCL (CARDIAC) 20 MG/ML IV SOLN
INTRAVENOUS | Status: DC | PRN
  Administered 2011-12-24: 100 mg via INTRAVENOUS

## 2011-12-24 MED ORDER — SODIUM CHLORIDE 0.9 % IV SOLN: INTRAVENOUS | Status: DC

## 2011-12-24 MED ORDER — FENTANYL CITRATE 0.05 MG/ML IJ SOLN
INTRAMUSCULAR | Status: DC | PRN
  Administered 2011-12-24 (×2): 50 ug via INTRAVENOUS
  Administered 2011-12-24: 100 ug via INTRAVENOUS
  Administered 2011-12-24 (×3): 50 ug via INTRAVENOUS

## 2011-12-24 MED ORDER — MIDAZOLAM HCL 5 MG/5ML IJ SOLN
INTRAMUSCULAR | Status: DC | PRN
  Administered 2011-12-24: 2 mg via INTRAVENOUS

## 2011-12-24 MED ORDER — SODIUM CHLORIDE 0.45 % IV SOLN
INTRAVENOUS | Status: DC
  Administered 2011-12-25: 125 mL/h via INTRAVENOUS
  Administered 2011-12-26: 02:00:00 via INTRAVENOUS

## 2011-12-24 MED ORDER — SODIUM CHLORIDE 0.9 % IV SOLN
INTRAVENOUS | Status: DC | PRN
  Administered 2011-12-24: 17:00:00 via INTRAVENOUS

## 2011-12-24 MED ORDER — HETASTARCH-ELECTROLYTES 6 % IV SOLN
INTRAVENOUS | Status: DC | PRN
  Administered 2011-12-24: 17:00:00 via INTRAVENOUS

## 2011-12-24 SURGICAL SUPPLY — 48 items
ADPR CATH UNV TPR FL F LL (CATHETERS) ×2
APPLICATOR COTTON TIP 6IN STRL (MISCELLANEOUS) ×3 IMPLANT
BLADE EXTENDED COATED 6.5IN (ELECTRODE) ×1 IMPLANT
BLADE HEX COATED 2.75 (ELECTRODE) ×3 IMPLANT
CANISTER SUCTION 2500CC (MISCELLANEOUS) ×4 IMPLANT
CATH T TUBE WHEL MOSS 14FR (CATHETERS) ×1 IMPLANT
CLOTH BEACON ORANGE TIMEOUT ST (SAFETY) ×3 IMPLANT
CONNECTOR CATH FOLEY FEMALE LL (CATHETERS) ×1 IMPLANT
COVER MAYO STAND STRL (DRAPES) ×1 IMPLANT
DRAIN CHANNEL 19F RND (DRAIN) ×1 IMPLANT
DRAPE C-ARM 42X72 X-RAY (DRAPES) ×1 IMPLANT
DRAPE LAPAROSCOPIC ABDOMINAL (DRAPES) ×3 IMPLANT
DRAPE UTILITY XL STRL (DRAPES) ×1 IMPLANT
DRAPE WARM FLUID 44X44 (DRAPE) ×1 IMPLANT
ELECT REM PT RETURN 9FT ADLT (ELECTROSURGICAL) ×3
ELECTRODE REM PT RTRN 9FT ADLT (ELECTROSURGICAL) ×2 IMPLANT
EVACUATOR SILICONE 100CC (DRAIN) ×1 IMPLANT
GLOVE BIOGEL PI IND STRL 7.0 (GLOVE) ×2 IMPLANT
GLOVE BIOGEL PI INDICATOR 7.0 (GLOVE) ×1
GLOVE INDICATOR 8.0 STRL GRN (GLOVE) ×6 IMPLANT
GLOVE SS BIOGEL STRL SZ 8 (GLOVE) ×2 IMPLANT
GLOVE SUPERSENSE BIOGEL SZ 8 (GLOVE) ×2
GOWN STRL NON-REIN LRG LVL3 (GOWN DISPOSABLE) ×4 IMPLANT
GOWN STRL REIN XL XLG (GOWN DISPOSABLE) ×5 IMPLANT
KIT BASIN OR (CUSTOM PROCEDURE TRAY) ×3 IMPLANT
NS IRRIG 1000ML POUR BTL (IV SOLUTION) ×4 IMPLANT
PACK GENERAL/GYN (CUSTOM PROCEDURE TRAY) ×3 IMPLANT
REMOVER STAPLE SKIN (DISPOSABLE) ×1 IMPLANT
SPONGE GAUZE 4X4 12PLY (GAUZE/BANDAGES/DRESSINGS) ×3 IMPLANT
SPONGE LAP 18X18 X RAY DECT (DISPOSABLE) IMPLANT
STAPLER VISISTAT 35W (STAPLE) ×2 IMPLANT
SUCTION POOLE TIP (SUCTIONS) ×1 IMPLANT
SUT ETHILON 2 0 PS N (SUTURE) ×2 IMPLANT
SUT PDS AB 1 CTX 36 (SUTURE) IMPLANT
SUT SILK 2 0 (SUTURE) ×3
SUT SILK 2-0 18XBRD TIE 12 (SUTURE) IMPLANT
SUT SILK 3 0 (SUTURE) ×3
SUT SILK 3 0 SH CR/8 (SUTURE) ×1 IMPLANT
SUT SILK 3-0 18XBRD TIE 12 (SUTURE) IMPLANT
SUT VIC AB 2-0 SH 18 (SUTURE) IMPLANT
SUT VIC AB 3-0 SH 18 (SUTURE) IMPLANT
SUT VIC AB 3-0 SH 27 (SUTURE) ×6
SUT VIC AB 3-0 SH 27XBRD (SUTURE) IMPLANT
SYR 30ML LL (SYRINGE) ×1 IMPLANT
TAPE CLOTH SURG 4X10 WHT LF (GAUZE/BANDAGES/DRESSINGS) ×1 IMPLANT
TOWEL OR 17X26 10 PK STRL BLUE (TOWEL DISPOSABLE) ×6 IMPLANT
TRAY FOLEY CATH 14FRSI W/METER (CATHETERS) ×1 IMPLANT
YANKAUER SUCT BULB TIP NO VENT (SUCTIONS) ×1 IMPLANT

## 2011-12-24 NOTE — H&P (View-Only) (Signed)
Patient ID: Frank Ashley, male   DOB: 08/30/1940, 71 y.o.   MRN: [id number] CT shows ileus but concern that T tube is dislodged.  T tube irrigated and irrigant immediately in JP drain.Pt looks poor and feel that re exploration is necessary.  Discussed with family and patient.  Risks include bleeding,  Infection,  Worsening medical problems,  And further operations.  Pt may required prolonged intubation as well.   Other risks include death,  Worsening kidney function,  And organ failure.  Alternatives are continue medical management but he is not improving.  They agree to proceed. 

## 2011-12-24 NOTE — Progress Notes (Signed)
4 Days Post-Op  Subjective: Feels bloated.  Miserable.  No BM  Objective: Vital signs in last 24 hours: Temp:  [98.8 F (37.1 C)-100.2 F (37.9 C)] 98.8 F (37.1 C) (08/23 0618) Pulse Rate:  [87-107] 89  (08/23 0618) Resp:  [12-26] 20  (08/23 0618) BP: (119-156)/(49-89) 136/56 mmHg (08/23 0618) SpO2:  [91 %-97 %] 96 % (08/23 0618) Last BM Date:  (prior to admission)  Intake/Output from previous day: 08/22 0701 - 08/23 0700 In: 590.2 [P.O.:60; I.V.:480.2; IV Piggyback:50] Out: 2025 [Urine:1455; Drains:570] Intake/Output this shift:    Incision/Wound:CLEAN DRY INTACT.  DISTENDED AND QUIET ABDOMEN.  JP with some bile noted.  Some epigastric discomfort  Lab Results:   Basename 12/24/11 0350 12/23/11 0315  WBC 3.5* 2.3*  HGB 9.3* 9.0*  HCT 28.4* 27.8*  PLT 231 178   BMET  Basename 12/24/11 0350 12/23/11 0315  NA 133* 132*  K 3.6 4.1  CL 103 101  CO2 20 23  GLUCOSE 114* 223*  BUN 32* 34*  CREATININE 1.70* 1.92*  CALCIUM 7.8* 7.6*   PT/INR No results found for this basename: LABPROT:2,INR:2 in the last 72 hours ABG  Basename 12/21/11 1445 12/21/11 0852  PHART 7.283* 7.237*  HCO3 17.6* 16.4*    Studies/Results: No results found.  Anti-infectives: Anti-infectives     Start     Dose/Rate Route Frequency Ordered Stop   12/23/11 1200   cefOXitin (MEFOXIN) 1 g in dextrose 5 % 50 mL IVPB        1 g 100 mL/hr over 30 Minutes Intravenous Every 8 hours 12/23/11 1037     12/22/11 0900   ciprofloxacin (CIPRO) IVPB 400 mg  Status:  Discontinued        400 mg 200 mL/hr over 60 Minutes Intravenous Every 12 hours 12/22/11 0800 12/23/11 0732   12/20/11 0630   ciprofloxacin (CIPRO) IVPB 400 mg        400 mg 200 mL/hr over 60 Minutes Intravenous 120 min pre-op 12/20/11 0617 12/20/11 0720          Assessment/Plan: s/p Procedure(s) (LRB): CHOLECYSTECTOMY WITH COMMON DUCT EXPLORATION (N/A) NEPHRECTOMY PARTIAL (Right) INTRAOPERATIVE CHOLANGIOGRAM () Constipation    Check KUB today. T tube clamped.  Some bile noted in drain.  Will follow at this point. Will check T tube cholangiogram prior to discharge.  LFT OK. WBC better DM  II  Control seems better. OOB Keep on clears for now. D/c foley  LOS: 4 days    Thresea Doble A. 12/24/2011

## 2011-12-24 NOTE — Interval H&P Note (Signed)
History and Physical Interval Note:  12/24/2011 2:45 PM  Frank Ashley  has presented today for surgery, with the diagnosis of possible illeus  The various methods of treatment have been discussed with the patient and family. After consideration of risks, benefits and other options for treatment, the patient has consented to  Procedure(s) (LRB): EXPLORATORY LAPAROTOMY (N/A) as a surgical intervention .  The patient's history has been reviewed, patient examined, no change in status, stable for surgery.  I have reviewed the patient's chart and labs.  Questions were answered to the patient's satisfaction.     Kyran Whittier A.

## 2011-12-24 NOTE — Progress Notes (Signed)
Patient ID: Frank Ashley, male   DOB: [birth year], 71 y.o.   MRN: [id number]   Pt seen and examined. Notes reviewed. Pt complain of feeling "a lot of gas" in abdomen.  H/H stable Cr 1.7 JP output up some Pt sitting in chair Inc- C/D/I Abd - soft, but distended Ext - SCD's  Foley out  Imp: POD#3 Plan: Keep RLQ JP for now. Diet per Gen Surg. Will follow.

## 2011-12-24 NOTE — Anesthesia Postprocedure Evaluation (Signed)
  Anesthesia Post-op Note  Patient: Frank Ashley  Procedure(s) Performed: Procedure(s) (LRB): EXPLORATORY LAPAROTOMY (N/A) INTRAOPERATIVE CHOLANGIOGRAM (N/A)  Patient Location: PACU  Anesthesia Type: General  Level of Consciousness: awake and alert   Airway and Oxygen Therapy: Patient Spontanous Breathing  Post-op Pain: mild  Post-op Assessment: Post-op Vital signs reviewed, Patient's Cardiovascular Status Stable, Respiratory Function Stable, Patent Airway and No signs of Nausea or vomiting  Post-op Vital Signs: stable  Complications: No apparent anesthesia complications. No breathing problems.Blood pressure at baseline.

## 2011-12-24 NOTE — Transfer of Care (Signed)
Immediate Anesthesia Transfer of Care Note  Patient: Frank Ashley  Procedure(s) Performed: Procedure(s) (LRB): EXPLORATORY LAPAROTOMY (N/A) INTRAOPERATIVE CHOLANGIOGRAM (N/A)  Patient Location: PACU  Anesthesia Type: General  Level of Consciousness: awake, alert  and patient cooperative  Airway & Oxygen Therapy: Patient Spontanous Breathing and Patient connected to face mask oxygen  Post-op Assessment: Report given to PACU RN and Post -op Vital signs reviewed and stable  Post vital signs: Reviewed and stable  Complications: No apparent anesthesia complications

## 2011-12-24 NOTE — Op Note (Signed)
Preoperative diagnosis: Peritonitis and suspected dislodgment of T TUBE  POSTOPERATIVE DIAGNOSIS: Same  Procedure: Exploratory laparotomy with common bile duct expiration and replacement of T-tube and T tube cholangiogram  Surgeon: Harriette Bouillon  M.D.  Assistant: Dr. Manus Rudd  MD  Anesthesia: Gen. Endotracheal anesthesia  EBL: 75 cc  Specimen: None:  Indications for procedure: The patient presents to the operating room 4 days status post open cholecystectomy and common bile duct exploration for chronic cholecystitis and choledocholithiasis. He had a previous Billroth II reconstruction 30 years ago making ERCP not possible or common bile duct clearance. He had increased drainage which was bilious in nature. His T-tube was clamped yesterday but he had more abdominal pain today.CT scan was done today which was suspicious for dislodgment of his T-tube. I flushed his T tube I directly into his JP drain.. I felt that reexploration was necessary to exclude and replace his T tube. He did have bile peritonitis. This was discussed the patient his wife preoperatively and they agree to proceed.  Description of procedure: The patient was met in the holding area and questions were answered. He was taken back to the operating room placed upon where general anesthesia was initiated. I removed both right sided Jackson-Pratt drains. T tube left in place. After sterile prep and drape and timeout was done. Skin staples removed. Right upper quadrant was opened. The fascial sutures were cut. Upon entering the abdominal cavity transverse colon was massively dilated consistent with ileus. There was a small amount of bile in the gallbladder bed and upon inspection the tube had become dislodged from the common bile duct. I removed the old tube. I was able to identify the opening the anterior wall of the common bile duct which was a little less than a centimeter longitudinally in size. A 12 [place] T tube was cut and  replaced into the common bile duct with one limb proximal to the second distal. There is significant inflammation of the surrounding tissues. I placed 3-0 Vicryl stitches on both sides of the opening the common bile duct where the T-tube entered. I flushed this and there was minimal leakage. Intraoperative cholangiogram was performed using one half strength Omnipaque dye. The common bile duct emptied into the duodenum and there is flow proximal and distal. There was knuckling of the proximal portion of the T tube. Given the difficulty in reinserting this and  the inflammatory changes around the common bile duct,   I felt this was adequate drainage and did not want to risk further damage to the common bile duct. Hemostasis was excellent. I suspect the colonic dilation put undue pressure upon the teaching and caused it to become dislodged I brought the T-tube out the upper abdomen above the incision so that there would be less pressure from the transverse colon. A second round Jackson-Pratt drain was replaced into the gallbladder fossa and brought out through the right lower quadrant separate stab incision. The T-tube is draining bile. The abdominal cavity is irrigated. The fascia was then closed #1 PDS. Subcutaneous fat and skin were irrigated. Staples were used to close the skin. Drainage tubes were placed receptacles. Dry dressings were applied. Final counts the sponge, needles and instruments are found to be correct the patient was extubated taken to recovery in satisfactory condition.

## 2011-12-24 NOTE — Anesthesia Preprocedure Evaluation (Signed)
Anesthesia Evaluation  Patient identified by MRN, date of birth, ID band Patient awake    Reviewed: Allergy & Precautions, H&P , NPO status , Patient's Chart, lab work & pertinent test results  History of Anesthesia Complications (+) PONV  Airway Mallampati: II TM Distance: >3 FB Neck ROM: Full    Dental No notable dental hx.    Pulmonary sleep apnea ,  breath sounds clear to auscultation  Pulmonary exam normal       Cardiovascular hypertension, Pt. on medications + CAD Rhythm:Regular Rate:Normal     Neuro/Psych Anxiety  Neuromuscular disease negative psych ROS   GI/Hepatic Neg liver ROS, GERD-  Medicated,  Endo/Other  Type 1, Insulin DependentMorbid obesity  Renal/GU Renal disease  negative genitourinary   Musculoskeletal negative musculoskeletal ROS (+)   Abdominal (+) + obese,   Peds negative pediatric ROS (+)  Hematology negative hematology ROS (+)   Anesthesia Other Findings   Reproductive/Obstetrics negative OB ROS                           Anesthesia Physical Anesthesia Plan  ASA: IV  Anesthesia Plan: General   Post-op Pain Management:    Induction: Intravenous  Airway Management Planned: Oral ETT  Additional Equipment:   Intra-op Plan:   Post-operative Plan: Extubation in OR  Informed Consent: I have reviewed the patients History and Physical, chart, labs and discussed the procedure including the risks, benefits and alternatives for the proposed anesthesia with the patient or authorized representative who has indicated his/her understanding and acceptance.   Dental advisory given  Plan Discussed with: CRNA  Anesthesia Plan Comments:         Anesthesia Quick Evaluation

## 2011-12-24 NOTE — Progress Notes (Signed)
Pt. Refused cpap for tonight. Pt. States he will notify if he changes his mind.

## 2011-12-24 NOTE — Progress Notes (Signed)
Pt declined enema before CT scan this am.  Pt brought back to floor at 12:30, sent to surgery immediately after, 14:30.  Enema not able to be completed.

## 2011-12-24 NOTE — Progress Notes (Signed)
Patient ID: Frank Ashley, male   DOB: [birth year], 71 y.o.   MRN: [id number] CT shows ileus but concern that T tube is dislodged.  T tube irrigated and irrigant immediately in JP drain.Pt looks poor and feel that re exploration is necessary.  Discussed with family and patient.  Risks include bleeding,  Infection,  Worsening medical problems,  And further operations.  Pt may required prolonged intubation as well.   Other risks include death,  Worsening kidney function,  And organ failure.  Alternatives are continue medical management but he is not improving.  They agree to proceed.

## 2011-12-25 LAB — CBC
HCT: 26.5 % — ABNORMAL LOW (ref 39.0–52.0)
Hemoglobin: 8.6 g/dL — ABNORMAL LOW (ref 13.0–17.0)
MCH: 25.1 pg — ABNORMAL LOW (ref 26.0–34.0)
RBC: 3.43 MIL/uL — ABNORMAL LOW (ref 4.22–5.81)

## 2011-12-25 LAB — COMPREHENSIVE METABOLIC PANEL
ALT: 21 U/L (ref 0–53)
Alkaline Phosphatase: 42 U/L (ref 39–117)
BUN: 31 mg/dL — ABNORMAL HIGH (ref 6–23)
CO2: 20 mEq/L (ref 19–32)
Calcium: 7.3 mg/dL — ABNORMAL LOW (ref 8.4–10.5)
GFR calc Af Amer: 51 mL/min — ABNORMAL LOW (ref >90)
GFR calc non Af Amer: 44 mL/min — ABNORMAL LOW (ref >90)
Glucose, Bld: 105 mg/dL — ABNORMAL HIGH (ref 70–99)
Potassium: 3.4 mEq/L — ABNORMAL LOW (ref 3.5–5.1)
Sodium: 134 mEq/L — ABNORMAL LOW (ref 135–145)

## 2011-12-25 LAB — GLUCOSE, CAPILLARY
Glucose-Capillary: 93 mg/dL (ref 70–99)
Glucose-Capillary: 109 mg/dL — ABNORMAL HIGH (ref 70–99)
Glucose-Capillary: 132 mg/dL — ABNORMAL HIGH (ref 70–99)

## 2011-12-25 MED ORDER — HYDROMORPHONE HCL PF 1 MG/ML IJ SOLN
1.0000 mg | INTRAMUSCULAR | Status: DC | PRN
  Administered 2011-12-25: 1 mg via INTRAVENOUS
  Administered 2011-12-25: 2 mg via INTRAVENOUS
  Administered 2011-12-25: 1 mg via INTRAVENOUS
  Administered 2011-12-25 (×2): 2 mg via INTRAVENOUS
  Administered 2011-12-26: 1 mg via INTRAVENOUS
  Administered 2011-12-26: 2 mg via INTRAVENOUS
  Administered 2011-12-26: 1 mg via INTRAVENOUS
  Administered 2011-12-26: 2 mg via INTRAVENOUS
  Administered 2011-12-26: 1 mg via INTRAVENOUS
  Administered 2011-12-26 – 2011-12-29 (×4): 2 mg via INTRAVENOUS
  Filled 2011-12-25: qty 2
  Filled 2011-12-25: qty 1
  Filled 2011-12-25 (×5): qty 2
  Filled 2011-12-25 (×2): qty 1
  Filled 2011-12-25 (×3): qty 2
  Filled 2011-12-25 (×2): qty 1

## 2011-12-25 MED ORDER — ACETAMINOPHEN 10 MG/ML IV SOLN
1000.0000 mg | Freq: Four times a day (QID) | INTRAVENOUS | Status: AC
  Administered 2011-12-25 – 2011-12-26 (×4): 1000 mg via INTRAVENOUS
  Filled 2011-12-25 (×4): qty 100

## 2011-12-25 NOTE — Progress Notes (Signed)
Patient ID: Frank Ashley, male   DOB: February 09, 1941, 71 y.o.   MRN: [id number] 1 Day Post-Op  Subjective: Complaining of a lot of incisional and generalized abdominal pain. Has not been out of bed.  Objective: Vital signs in last 24 hours: Temp:  [97.6 F (36.4 C)-98.8 F (37.1 C)] 97.9 F (36.6 C) (08/24 1017) Pulse Rate:  [92-114] 92  (08/24 1017) Resp:  [14-20] 18  (08/24 1017) BP: (128-174)/(47-85) 128/76 mmHg (08/24 1017) SpO2:  [96 %-100 %] 99 % (08/24 1017) Last BM Date:  (prior to admission)  Intake/Output from previous day: 08/23 0701 - 08/24 0700 In: 1585.4 [I.V.:1085.4; IV Piggyback:500] Out: 3335 [Urine:2425; Drains:635; Blood:275] Intake/Output this shift:    General appearance: alert, mild distress and morbidly obese Resp: clear to auscultation bilaterally GI: minimal diffuse abdominal tenderness but quite tender around his right upper quadrant incision. Appropriate serosanguineous JP drainage and clear bile in the T-tube. Incision/Wound: dressings clean and dry and intact  Lab Results:   Basename 12/25/11 0432 12/24/11 0350  WBC 4.7 3.5*  HGB 8.6* 9.3*  HCT 26.5* 28.4*  PLT 229 231   BMET  Basename 12/25/11 0432 12/24/11 0350  NA 134* 133*  K 3.4* 3.6  CL 104 103  CO2 20 20  GLUCOSE 105* 114*  BUN 31* 32*  CREATININE 1.54* 1.70*  CALCIUM 7.3* 7.8*     Studies/Results: Ct Abdomen Pelvis Wo Contrast  12/24/2011  *RADIOLOGY REPORT*  Clinical Data: Postop day #4 from open cholecystectomy and right partial nephrectomy  CT ABDOMEN AND PELVIS WITHOUT CONTRAST  Technique:  Multidetector CT imaging of the abdomen and pelvis was performed following the standard protocol without intravenous contrast.  Comparison: 09/14/2011  Findings: Imaging through the lower chest shows bilateral lower lobe atelectasis with tiny right pleural effusion.  Subtle heterogeneity of the liver, as seen previously, consistent with geographic fatty infiltration.  Calcified granulomata  are noted in both the liver and the spleen.  The patient is status post distal gastrectomy. The gastrojejunostomy is patent.  A surgical drain entering from the right abdomen is seen coiled in the gallbladder fossa and inferior to the liver. There are some gas locules around the drain, not  unexpected on postop day #4.  More anteriorly, near the midline, the T tube can be seen to enter the peritoneal cavity and the tip is positioned anterior to the porta hepatis. I cannot definitely track the T tube into the common bile duct, but this may be secondary to the T-tube being fairly peripherally positioned.  There is no evidence for a fluid collection around the common bile duct or in the porta hepatis.  No fluid is seen tracking around the liver.  No abdominal aortic aneurysm.  No lymphadenopathy in the abdomen.  There is a small amount of fluid in the retroperitoneal tissues around the right kidney, especially towards the lower pole. Several tiny gas locules are seen in the same region.  This is all compatible with the recent surgery.  Eight JP drain is seen just lateral to the right kidney.  Diffuse fluid filled and distended large and small bowel is evident.  The oral contrast for days study has only migrated slightly into the distended small bowel.  The small bowel loops measure up to about 3.5 cm in diameter.  Imaging through the pelvis shows no free intraperitoneal fluid. Bladder is unremarkable.  There is some body wall edema in the lower abdomen and pelvis.  Bone windows reveal no worrisome lytic  or sclerotic osseous lesions.  IMPRESSION: Diffuse fluid filled and distended large and small bowel with poor contrast transit in the small bowel loops.  Imaging features suggest adynamic ileus.  The tip of the ET tube cannot be definitely confirmed to lie within the common bile duct, but this may be projectional.  No associated fluid collection in the gallbladder fossa or porta hepatis.  There is no fluid around the  liver.  Small amount of fluid and gas adjacent to the lower pole of the right kidney, not unexpected on postoperative day #4.  Findings were discussed with Dr. Luisa Hart on the afternoon of 12/24/2011.   Original Report Authenticated By: ERIC A. MANSELL, M.D.    Dg Cholangiogram Operative  12/24/2011  *RADIOLOGY REPORT*  Clinical Data:   Cholecystectomy, evaluate for retained gallstone  INTRAOPERATIVE CHOLANGIOGRAM  Technique:  Cholangiographic images from the C-arm fluoroscopic device were submitted for interpretation post-operatively.  Please see the procedural report for the amount of contrast and the fluoroscopy time utilized.  Comparison:  CT abdomen pelvis of 12/24/2011.  Findings:   Contrast was injected via the indwelling T-tube.  The common bile duct is opacified.  No filling defect is seen, and there is passage of contrast into the duodenal loop. No extravasation of contrast is noted.  IMPRESSION: No retained common bile duct calculi are seen after injection of T- tube.   Original Report Authenticated By: Juline Patch, M.D.     Anti-infectives: Anti-infectives     Start     Dose/Rate Route Frequency Ordered Stop   12/24/11 1523   vancomycin (VANCOCIN) 1,500 mg in sodium chloride 0.9 % 500 mL IVPB  Status:  Discontinued        1,500 mg 250 mL/hr over 120 Minutes Intravenous 120 min pre-op 12/24/11 1523 12/24/11 1918   12/23/11 1200   cefOXitin (MEFOXIN) 1 g in dextrose 5 % 50 mL IVPB        1 g 100 mL/hr over 30 Minutes Intravenous Every 8 hours 12/23/11 1037     12/22/11 0900   ciprofloxacin (CIPRO) IVPB 400 mg  Status:  Discontinued        400 mg 200 mL/hr over 60 Minutes Intravenous Every 12 hours 12/22/11 0800 12/23/11 0732   12/20/11 0630   ciprofloxacin (CIPRO) IVPB 400 mg        400 mg 200 mL/hr over 60 Minutes Intravenous 120 min pre-op 12/20/11 0617 12/20/11 0720          Assessment/Plan: s/p Procedure(s): EXPLORATORY LAPAROTOMY INTRAOPERATIVE CHOLANGIOGRAM Stable  but poor pain control. We will address his pain medications. Wife was asking about pathology from kidney which has revealed renal cell carcinoma and I gave him this information.   LOS: 5 days    Shaqueta Casady T 12/25/2011

## 2011-12-25 NOTE — Progress Notes (Signed)
1 Day Post-Op Subjective: Patient reports abdominal discomfort and gas pain. I am aware of returned to the operating room last night.  Objective: Vital signs in last 24 hours: Temp:  [97.4 F (36.3 C)-98.8 F (37.1 C)] 98.4 F (36.9 C) (08/24 0559) Pulse Rate:  [88-114] 111  (08/24 0559) Resp:  [14-20] 18  (08/24 0559) BP: (131-174)/(47-85) 131/77 mmHg (08/24 0559) SpO2:  [96 %-100 %] 98 % (08/24 0559)  Intake/Output from previous day: 08/23 0701 - 08/24 0700 In: 1585.4 [I.V.:1085.4; IV Piggyback:500] Out: 3335 [Urine:2425; Drains:635; Blood:275] Intake/Output this shift: Total I/O In: 0  Out: 2070 [Urine:1650; Drains:420]  Physical Exam:  Constitutional: Vital signs reviewed.  Abdomen-dressing dry. Bowel sounds diminished.    Lab Results:  Basename 12/25/11 0432 12/24/11 0350 12/23/11 0315  HGB 8.6* 9.3* 9.0*  HCT 26.5* 28.4* 27.8*   BMET  Basename 12/25/11 0432 12/24/11 0350  NA 134* 133*  K 3.4* 3.6  CL 104 103  CO2 20 20  GLUCOSE 105* 114*  BUN 31* 32*  CREATININE 1.54* 1.70*  CALCIUM 7.3* 7.8*   No results found for this basename: LABPT:3,INR:3 in the last 72 hours No results found for this basename: LABURIN:1 in the last 72 hours Results for orders placed during the hospital encounter of 12/14/11  SURGICAL PCR SCREEN     Status: Normal   Collection Time   12/14/11 11:26 AM      Component Value Range Status Comment   MRSA, PCR NEGATIVE  NEGATIVE Final    Staphylococcus aureus NEGATIVE  NEGATIVE Final     Studies/Results: Ct Abdomen Pelvis Wo Contrast  12/24/2011  *RADIOLOGY REPORT*  Clinical Data: Postop day #4 from open cholecystectomy and right partial nephrectomy  CT ABDOMEN AND PELVIS WITHOUT CONTRAST  Technique:  Multidetector CT imaging of the abdomen and pelvis was performed following the standard protocol without intravenous contrast.  Comparison: 09/14/2011  Findings: Imaging through the lower chest shows bilateral lower lobe atelectasis with  tiny right pleural effusion.  Subtle heterogeneity of the liver, as seen previously, consistent with geographic fatty infiltration.  Calcified granulomata are noted in both the liver and the spleen.  The patient is status post distal gastrectomy. The gastrojejunostomy is patent.  A surgical drain entering from the right abdomen is seen coiled in the gallbladder fossa and inferior to the liver. There are some gas locules around the drain, not  unexpected on postop day #4.  More anteriorly, near the midline, the T tube can be seen to enter the peritoneal cavity and the tip is positioned anterior to the porta hepatis. I cannot definitely track the T tube into the common bile duct, but this may be secondary to the T-tube being fairly peripherally positioned.  There is no evidence for a fluid collection around the common bile duct or in the porta hepatis.  No fluid is seen tracking around the liver.  No abdominal aortic aneurysm.  No lymphadenopathy in the abdomen.  There is a small amount of fluid in the retroperitoneal tissues around the right kidney, especially towards the lower pole. Several tiny gas locules are seen in the same region.  This is all compatible with the recent surgery.  Eight JP drain is seen just lateral to the right kidney.  Diffuse fluid filled and distended large and small bowel is evident.  The oral contrast for days study has only migrated slightly into the distended small bowel.  The small bowel loops measure up to about 3.5 cm in diameter.  Imaging through the pelvis shows no free intraperitoneal fluid. Bladder is unremarkable.  There is some body wall edema in the lower abdomen and pelvis.  Bone windows reveal no worrisome lytic or sclerotic osseous lesions.  IMPRESSION: Diffuse fluid filled and distended large and small bowel with poor contrast transit in the small bowel loops.  Imaging features suggest adynamic ileus.  The tip of the ET tube cannot be definitely confirmed to lie within the  common bile duct, but this may be projectional.  No associated fluid collection in the gallbladder fossa or porta hepatis.  There is no fluid around the liver.  Small amount of fluid and gas adjacent to the lower pole of the right kidney, not unexpected on postoperative day #4.  Findings were discussed with Dr. Luisa Hart on the afternoon of 12/24/2011.   Original Report Authenticated By: ERIC A. MANSELL, M.D.    Dg Cholangiogram Operative  12/24/2011  *RADIOLOGY REPORT*  Clinical Data:   Cholecystectomy, evaluate for retained gallstone  INTRAOPERATIVE CHOLANGIOGRAM  Technique:  Cholangiographic images from the C-arm fluoroscopic device were submitted for interpretation post-operatively.  Please see the procedural report for the amount of contrast and the fluoroscopy time utilized.  Comparison:  CT abdomen pelvis of 12/24/2011.  Findings:   Contrast was injected via the indwelling T-tube.  The common bile duct is opacified.  No filling defect is seen, and there is passage of contrast into the duodenal loop. No extravasation of contrast is noted.  IMPRESSION: No retained common bile duct calculi are seen after injection of T- tube.   Original Report Authenticated By: Juline Patch, M.D.     Assessment/Plan:   Postoperative day 4 right partial nephrectomy/cholecystectomy/time bile duct exploration with revision of T tube last night. Creatinine is improving. No change in plan from a GU standpoint. Correction plans per general surgery.   LOS: 5 days   Marcine Matar M 12/25/2011, 6:49 AM

## 2011-12-26 ENCOUNTER — Inpatient Hospital Stay (HOSPITAL_COMMUNITY): Payer: Medicare Other

## 2011-12-26 LAB — GLUCOSE, CAPILLARY
Glucose-Capillary: 104 mg/dL — ABNORMAL HIGH (ref 70–99)
Glucose-Capillary: 118 mg/dL — ABNORMAL HIGH (ref 70–99)
Glucose-Capillary: 132 mg/dL — ABNORMAL HIGH (ref 70–99)
Glucose-Capillary: 149 mg/dL — ABNORMAL HIGH (ref 70–99)
Glucose-Capillary: 152 mg/dL — ABNORMAL HIGH (ref 70–99)
Glucose-Capillary: 182 mg/dL — ABNORMAL HIGH (ref 70–99)
Glucose-Capillary: 69 mg/dL — ABNORMAL LOW (ref 70–99)

## 2011-12-26 LAB — BASIC METABOLIC PANEL
Chloride: 104 mEq/L (ref 96–112)
GFR calc Af Amer: 65 mL/min — ABNORMAL LOW (ref >90)
GFR calc non Af Amer: 56 mL/min — ABNORMAL LOW (ref >90)
Glucose, Bld: 164 mg/dL — ABNORMAL HIGH (ref 70–99)
Potassium: 4.1 mEq/L (ref 3.5–5.1)
Sodium: 135 mEq/L (ref 135–145)

## 2011-12-26 LAB — CBC
Hemoglobin: 8.8 g/dL — ABNORMAL LOW (ref 13.0–17.0)
MCHC: 33.7 g/dL (ref 30.0–36.0)
RDW: 17.7 % — ABNORMAL HIGH (ref 11.5–15.5)
WBC: 7.4 10*3/uL (ref 4.0–10.5)

## 2011-12-26 MED ORDER — DEXTROSE 50 % IV SOLN
25.0000 mL | Freq: Once | INTRAVENOUS | Status: AC | PRN
  Administered 2011-12-26: 25 mL via INTRAVENOUS
  Filled 2011-12-26: qty 50

## 2011-12-26 MED ORDER — HYDROMORPHONE 0.3 MG/ML IV SOLN
INTRAVENOUS | Status: DC
  Administered 2011-12-26: 17:00:00 via INTRAVENOUS
  Administered 2011-12-26: 2.1 mg via INTRAVENOUS
  Administered 2011-12-26: 1.8 mg via INTRAVENOUS
  Administered 2011-12-27: 2.1 mg via INTRAVENOUS
  Administered 2011-12-27: 1.2 mg via INTRAVENOUS
  Administered 2011-12-27: 3 mg via INTRAVENOUS
  Administered 2011-12-27: 1.8 mg via INTRAVENOUS
  Administered 2011-12-27: 25 mg via INTRAVENOUS
  Administered 2011-12-28: 2.7 mg via INTRAVENOUS
  Administered 2011-12-28: 4.5 mg via INTRAVENOUS
  Administered 2011-12-28: 5.7 mg via INTRAVENOUS
  Administered 2011-12-28: 4.2 mg via INTRAVENOUS
  Administered 2011-12-28: 3.3 mg via INTRAVENOUS
  Administered 2011-12-28: 15:00:00 via INTRAVENOUS
  Administered 2011-12-29: 3.3 mg via INTRAVENOUS
  Administered 2011-12-29: 0.54 mg via INTRAVENOUS
  Administered 2011-12-29: 1.5 mg via INTRAVENOUS
  Filled 2011-12-26 (×6): qty 25

## 2011-12-26 MED ORDER — ONDANSETRON HCL 4 MG/2ML IJ SOLN: 4.0000 mg | Freq: Four times a day (QID) | INTRAMUSCULAR | Status: DC | PRN

## 2011-12-26 MED ORDER — GLUCAGON HCL (RDNA) 1 MG IJ SOLR: 1.0000 mg | Freq: Once | INTRAMUSCULAR | Status: AC | PRN

## 2011-12-26 MED ORDER — NALOXONE HCL 0.4 MG/ML IJ SOLN: 0.4000 mg | INTRAMUSCULAR | Status: DC | PRN

## 2011-12-26 MED ORDER — DIPHENHYDRAMINE HCL 50 MG/ML IJ SOLN: 12.5000 mg | Freq: Four times a day (QID) | INTRAMUSCULAR | Status: DC | PRN

## 2011-12-26 MED ORDER — PHENOL 1.4 % MT LIQD
1.0000 | OROMUCOSAL | Status: DC | PRN
  Filled 2011-12-26: qty 177

## 2011-12-26 MED ORDER — DIPHENHYDRAMINE HCL 12.5 MG/5ML PO ELIX
12.5000 mg | ORAL_SOLUTION | Freq: Four times a day (QID) | ORAL | Status: DC | PRN
  Filled 2011-12-26: qty 5

## 2011-12-26 MED ORDER — DEXTROSE 50 % IV SOLN
25.0000 mL | Freq: Once | INTRAVENOUS | Status: AC | PRN
  Administered 2011-12-27: 25 mL via INTRAVENOUS
  Filled 2011-12-26 (×2): qty 50

## 2011-12-26 MED ORDER — DEXTROSE 5 % IV SOLN: INTRAVENOUS | Status: DC

## 2011-12-26 MED ORDER — GLUCAGON HCL (RDNA) 1 MG IJ SOLR: 0.5000 mg | Freq: Once | INTRAMUSCULAR | Status: AC | PRN

## 2011-12-26 MED ORDER — SODIUM CHLORIDE 0.9 % IJ SOLN: 9.0000 mL | INTRAMUSCULAR | Status: DC | PRN

## 2011-12-26 NOTE — Progress Notes (Signed)
Hypoglycemic Event  CBG: 69  Treatment: D50 IV 25 mL  Symptoms: None  Follow-up CBG: Time: 2035 CBG Result: 104  Possible Reasons for Event: Inadequate meal intake  Comments/MD notified:Pt NPO    Val Eagle, [name]  Remember to initiate Hypoglycemia Order Set & complete

## 2011-12-26 NOTE — Progress Notes (Signed)
2 Days Post-Op Subjective: Patient reports continued pain, although he says this is better than yesterday. He has had flatus and bowel movements.  Objective: Vital signs in last 24 hours: Temp:  [97.6 F (36.4 C)-98.4 F (36.9 C)] 97.6 F (36.4 C) (08/25 0541) Pulse Rate:  [85-92] 85  (08/25 0541) Resp:  [18] 18  (08/25 0541) BP: (126-151)/(68-76) 151/76 mmHg (08/25 0541) SpO2:  [93 %-99 %] 97 % (08/25 0541)  Intake/Output from previous day: 08/24 0701 - 08/25 0700 In: 2485 [I.V.:2275; NG/GT:60; IV Piggyback:150] Out: 3540 [Urine:2550; Emesis/NG output:600; Drains:390] Intake/Output this shift: Total I/O In: 500 [I.V.:500] Out: 3240 [Urine:2250; Emesis/NG output:600; Drains:390]  Physical Exam:  Constitutional: Vital signs reviewed. WD WN in NAD   Eyes: PERRL, No scleral icterus.   Cardiovascular: RRR Pulmonary/Chest: Normal effort Abdominal: Bowel sounds are present. Dressings are dry.   Lab Results:  Basename 12/26/11 0430 12/25/11 0432 12/24/11 0350  HGB 8.8* 8.6* 9.3*  HCT 26.1* 26.5* 28.4*   BMET  Basename 12/26/11 0430 12/25/11 0432  NA 135 134*  K 4.1 3.4*  CL 104 104  CO2 24 20  GLUCOSE 164* 105*  BUN 30* 31*  CREATININE 1.26 1.54*  CALCIUM 7.5* 7.3*   No results found for this basename: LABPT:3,INR:3 in the last 72 hours No results found for this basename: LABURIN:1 in the last 72 hours Results for orders placed during the hospital encounter of 12/14/11  SURGICAL PCR SCREEN     Status: Normal   Collection Time   12/14/11 11:26 AM      Component Value Range Status Comment   MRSA, PCR NEGATIVE  NEGATIVE Final    Staphylococcus aureus NEGATIVE  NEGATIVE Final     Studies/Results:  Assessment/Plan:   Postoperative day 5 right partial nephrectomy. From a urologic/renal standpoint, he is doing well. Specific J-P drainage is not recorded.    I discussed overall pathology findings with the patient-renal cell carcinoma. I have not discussed margin  status with him yet, however. I would recommend Dr. Mena Goes speak with him regarding that.    I wrote for his Foley to be discontinued. He needs to get out of bed.   LOS: 6 days   Marcine Matar M 12/26/2011, 6:18 AM

## 2011-12-26 NOTE — Progress Notes (Signed)
2 Days Post-Op  Subjective: Still c/o RUQ pain which he says is better than yesterday but still significant.  His main complaint is his sore throat.Frank Ashley  BMx2  Objective: Vital signs in last 24 hours: Temp:  [97.6 F (36.4 C)-98.4 F (36.9 C)] 97.6 F (36.4 C) (08/25 0541) Pulse Rate:  [85-89] 85  (08/25 0541) Resp:  [18] 18  (08/25 0541) BP: (126-151)/(68-76) 151/76 mmHg (08/25 0541) SpO2:  [93 %-97 %] 97 % (08/25 0541) Last BM Date: 12/26/11  Intake/Output from previous day: 08/24 0701 - 08/25 0700 In: 3485 [I.V.:3275; NG/GT:60; IV Piggyback:150] Out: 3540 [Urine:2550; Emesis/NG output:600; Drains:390] Intake/Output this shift:    General appearance: alert, cooperative and no distress Resp: nonlabored Cardio: normal rate GI: soft, incisional tenderness in RUQ but wound looks okay, he has mild diffuse tenderness but no peritoneal signs.  Ttube with thin black bile, JP with bile tinged output Extremities: SCD's in place  Lab Results:   Basename 12/26/11 0430 12/25/11 0432  WBC 7.4 4.7  HGB 8.8* 8.6*  HCT 26.1* 26.5*  PLT 243 229   BMET  Basename 12/26/11 0430 12/25/11 0432  NA 135 134*  K 4.1 3.4*  CL 104 104  CO2 24 20  GLUCOSE 164* 105*  BUN 30* 31*  CREATININE 1.26 1.54*  CALCIUM 7.5* 7.3*   PT/INR No results found for this basename: LABPROT:2,INR:2 in the last 72 hours ABG No results found for this basename: PHART:2,PCO2:2,PO2:2,HCO3:2 in the last 72 hours  Studies/Results: Ct Abdomen Pelvis Wo Contrast  12/24/2011  *RADIOLOGY REPORT*  Clinical Data: Postop day #4 from open cholecystectomy and right partial nephrectomy  CT ABDOMEN AND PELVIS WITHOUT CONTRAST  Technique:  Multidetector CT imaging of the abdomen and pelvis was performed following the standard protocol without intravenous contrast.  Comparison: 09/14/2011  Findings: Imaging through the lower chest shows bilateral lower lobe atelectasis with tiny right pleural effusion.  Subtle heterogeneity of  the liver, as seen previously, consistent with geographic fatty infiltration.  Calcified granulomata are noted in both the liver and the spleen.  The patient is status post distal gastrectomy. The gastrojejunostomy is patent.  A surgical drain entering from the right abdomen is seen coiled in the gallbladder fossa and inferior to the liver. There are some gas locules around the drain, not  unexpected on postop day #4.  More anteriorly, near the midline, the T tube can be seen to enter the peritoneal cavity and the tip is positioned anterior to the porta hepatis. I cannot definitely track the T tube into the common bile duct, but this may be secondary to the T-tube being fairly peripherally positioned.  There is no evidence for a fluid collection around the common bile duct or in the porta hepatis.  No fluid is seen tracking around the liver.  No abdominal aortic aneurysm.  No lymphadenopathy in the abdomen.  There is a small amount of fluid in the retroperitoneal tissues around the right kidney, especially towards the lower pole. Several tiny gas locules are seen in the same region.  This is all compatible with the recent surgery.  Eight JP drain is seen just lateral to the right kidney.  Diffuse fluid filled and distended large and small bowel is evident.  The oral contrast for days study has only migrated slightly into the distended small bowel.  The small bowel loops measure up to about 3.5 cm in diameter.  Imaging through the pelvis shows no free intraperitoneal fluid. Bladder is unremarkable.  There  is some body wall edema in the lower abdomen and pelvis.  Bone windows reveal no worrisome lytic or sclerotic osseous lesions.  IMPRESSION: Diffuse fluid filled and distended large and small bowel with poor contrast transit in the small bowel loops.  Imaging features suggest adynamic ileus.  The tip of the ET tube cannot be definitely confirmed to lie within the common bile duct, but this may be projectional.  No  associated fluid collection in the gallbladder fossa or porta hepatis.  There is no fluid around the liver.  Small amount of fluid and gas adjacent to the lower pole of the right kidney, not unexpected on postoperative day #4.  Findings were discussed with Dr. Luisa Hart on the afternoon of 12/24/2011.   Original Report Authenticated By: ERIC A. MANSELL, M.D.    Dg Cholangiogram Operative  12/24/2011  *RADIOLOGY REPORT*  Clinical Data:   Cholecystectomy, evaluate for retained gallstone  INTRAOPERATIVE CHOLANGIOGRAM  Technique:  Cholangiographic images from the C-arm fluoroscopic device were submitted for interpretation post-operatively.  Please see the procedural report for the amount of contrast and the fluoroscopy time utilized.  Comparison:  CT abdomen pelvis of 12/24/2011.  Findings:   Contrast was injected via the indwelling T-tube.  The common bile duct is opacified.  No filling defect is seen, and there is passage of contrast into the duodenal loop. No extravasation of contrast is noted.  IMPRESSION: No retained common bile duct calculi are seen after injection of T- tube.   Original Report Authenticated By: Juline Patch, M.D.     Anti-infectives: Anti-infectives     Start     Dose/Rate Route Frequency Ordered Stop   12/24/11 1523   vancomycin (VANCOCIN) 1,500 mg in sodium chloride 0.9 % 500 mL IVPB  Status:  Discontinued        1,500 mg 250 mL/hr over 120 Minutes Intravenous 120 min pre-op 12/24/11 1523 12/24/11 1918   12/23/11 1200   cefOXitin (MEFOXIN) 1 g in dextrose 5 % 50 mL IVPB        1 g 100 mL/hr over 30 Minutes Intravenous Every 8 hours 12/23/11 1037     12/22/11 0900   ciprofloxacin (CIPRO) IVPB 400 mg  Status:  Discontinued        400 mg 200 mL/hr over 60 Minutes Intravenous Every 12 hours 12/22/11 0800 12/23/11 0732   12/20/11 0630   ciprofloxacin (CIPRO) IVPB 400 mg        400 mg 200 mL/hr over 60 Minutes Intravenous 120 min pre-op 12/20/11 0617 12/20/11 0720           Assessment/Plan: s/p Procedure(s) (LRB): EXPLORATORY LAPAROTOMY (N/A) INTRAOPERATIVE CHOLANGIOGRAM (N/A) He still complains of incisional pain.  Will try PCA dilaudid.  Will trial clamping NG tube since he is showing sign of bowel function.  LOS: 6 days    Frank Ashley 12/26/2011

## 2011-12-26 NOTE — Progress Notes (Signed)
Patient refuses to use nocturnal CPAP tonight due to discomfort with the NG tube. States he tried it last night and was not able to tolerate it. However, he does wish to continue use nocturnally once the NG is removed.

## 2011-12-27 ENCOUNTER — Encounter (HOSPITAL_COMMUNITY): Payer: Self-pay | Admitting: Surgery

## 2011-12-27 LAB — CBC WITH DIFFERENTIAL/PLATELET
Basophils Absolute: 0 10*3/uL (ref 0.0–0.1)
Basophils Relative: 0 % (ref 0–1)
Hemoglobin: 8.9 g/dL — ABNORMAL LOW (ref 13.0–17.0)
MCHC: 32.5 g/dL (ref 30.0–36.0)
Neutro Abs: 5.3 10*3/uL (ref 1.7–7.7)
Neutrophils Relative %: 70 % (ref 43–77)
RDW: 17.6 % — ABNORMAL HIGH (ref 11.5–15.5)

## 2011-12-27 LAB — COMPREHENSIVE METABOLIC PANEL
ALT: 17 U/L (ref 0–53)
AST: 27 U/L (ref 0–37)
Albumin: 1.7 g/dL — ABNORMAL LOW (ref 3.5–5.2)
Calcium: 7.6 mg/dL — ABNORMAL LOW (ref 8.4–10.5)
GFR calc Af Amer: 70 mL/min — ABNORMAL LOW (ref >90)
Sodium: 135 mEq/L (ref 135–145)
Total Protein: 4.8 g/dL — ABNORMAL LOW (ref 6.0–8.3)

## 2011-12-27 LAB — GLUCOSE, CAPILLARY
Glucose-Capillary: 97 mg/dL (ref 70–99)
Glucose-Capillary: 98 mg/dL (ref 70–99)
Glucose-Capillary: 152 mg/dL — ABNORMAL HIGH (ref 70–99)

## 2011-12-27 MED ORDER — DEXTROSE-NACL 5-0.45 % IV SOLN
INTRAVENOUS | Status: DC
  Administered 2011-12-27 – 2011-12-30 (×6): via INTRAVENOUS
  Administered 2011-12-30: 50 mL/h via INTRAVENOUS
  Administered 2011-12-31: 20 mL via INTRAVENOUS
  Filled 2011-12-27: qty 1000

## 2011-12-27 MED ORDER — LORAZEPAM 1 MG PO TABS
1.0000 mg | ORAL_TABLET | ORAL | Status: DC | PRN
  Administered 2011-12-27 – 2011-12-29 (×2): 1 mg via ORAL
  Filled 2011-12-27 (×3): qty 1

## 2011-12-27 MED ORDER — METOCLOPRAMIDE HCL 5 MG/ML IJ SOLN
10.0000 mg | Freq: Four times a day (QID) | INTRAMUSCULAR | Status: DC
  Administered 2011-12-27 – 2011-12-31 (×17): 10 mg via INTRAVENOUS
  Filled 2011-12-27 (×21): qty 2

## 2011-12-27 NOTE — Progress Notes (Signed)
Physical Therapy Treatment Patient Details Name: Frank Ashley MRN: [id number] DOB: 1941/04/27 Today's Date: 12/27/2011 Time: 0940-1005 PT Time Calculation (min): 25 min  PT Assessment / Plan / Recommendation Comments on Treatment Session  Pt pre medicated with Ativan prior to session.  Pt still requires +2 assist and MAX positive reinforcement to decrease anxiety as pt repeated, "Are you sure about this".  Assisted pt OOB to [facility] to void then amb in hallway + 2 assist for equipment and safety)chair following).    Follow Up Recommendations  LTACH    Barriers to Discharge        Equipment Recommendations  Rolling walker with 5" wheels    Recommendations for Other Services    Frequency Min 3X/week   Plan Discharge plan remains appropriate    Precautions / Restrictions Precautions Precautions: Fall Precaution Comments: ABD drain and incision Restrictions Weight Bearing Restrictions: No    Pertinent Vitals/Pain C/o ABD pain/discomfort    Mobility  Bed Mobility Bed Mobility: Supine to Sit Supine to Sit: 1: +2 Total assist Supine to Sit: Patient Percentage: 50% Details for Bed Mobility Assistance: increased time and HOB elevated 45' 2nd ABD incision  Transfers Transfers: Sit to Stand;Stand to Sit Sit to Stand: 1: +2 Total assist;From bed Sit to Stand: Patient Percentage: 50% Stand to Sit: 1: +2 Total assist Stand to Sit: Patient Percentage: 50% Details for Transfer Assistance: 50% VC's for hand placement to increase self assist and decrease anxiety.  Pt repeated, "are you sure about this?'  Ambulation/Gait Ambulation/Gait Assistance: 1: +2 Total assist Ambulation/Gait: Patient Percentage: 60% Ambulation Distance (Feet): 33 Feet Assistive device: Rolling walker Ambulation/Gait Assistance Details: 75% VC's on proper sequencing and proper walker to self distance as pt tended to push walker out too far to the front.  Chair following closely behind and VC's to decrease pt's  anxiety level as he repeated, "Are you sure about this?' Gait Pattern: Step-to pattern;Decreased stance time - left;Decreased stride length;Trunk flexed;Shuffle Gait velocity: decreased     PT Goals                         progressing   Visit Information  Last PT Received On: 12/27/11 Assistance Needed: +2    Subjective Data  Subjective: I feel really sleepy and dopy Patient Stated Goal: home   Cognition    good Anxiety   Balance   poor  End of Session PT - End of Session Equipment Utilized During Treatment: Gait belt;Oxygen (2 lts) Activity Tolerance: Treatment limited secondary to medical complications (Comment) (medications and x2 ABD surgeries) Patient left: in chair;with call bell/phone within reach Nurse Communication: Mobility status  Felecia Shelling  PTA [facility]  Acute  Rehab Pager     [phone]

## 2011-12-27 NOTE — Progress Notes (Signed)
3 Days Post-Op Subjective: Patient complains of incisional pain. Has had BM and flatus. Feeling tired.   Objective: Vital signs in last 24 hours: Temp:  [97.4 F (36.3 C)-98.4 F (36.9 C)] 97.4 F (36.3 C) (08/26 1400) Pulse Rate:  [69-93] 92  (08/26 1400) Resp:  [12-22] 22  (08/26 1400) BP: (146-175)/(67-77) 175/67 mmHg (08/26 1400) SpO2:  [95 %-100 %] 100 % (08/26 1400)  Intake/Output from previous day: 08/25 0701 - 08/26 0700 In: 3115 [P.O.:240; I.V.:2875] Out: 2140 [Urine:1450; Drains:690] Intake/Output this shift: Total I/O In: 1105 [P.O.:360; I.V.:745] Out: 1000 [Urine:800; Drains:200]  Physical Exam: NAD Abd - soft, much less distended Ext - no CCE, no pain  Lab Results:  Basename 12/27/11 0430 12/26/11 0430 12/25/11 0432  HGB 8.9* 8.8* 8.6*  HCT 27.4* 26.1* 26.5*   BMET  Basename 12/27/11 0430 12/26/11 0430  NA 135 135  K 3.9 4.1  CL 104 104  CO2 24 24  GLUCOSE 99 164*  BUN 23 30*  CREATININE 1.18 1.26  CALCIUM 7.6* 7.5*   No results found for this basename: LABPT:3,INR:3 in the last 72 hours No results found for this basename: LABURIN:1 in the last 72 hours Results for orders placed during the hospital encounter of 12/14/11  SURGICAL PCR SCREEN     Status: Normal   Collection Time   12/14/11 11:26 AM      Component Value Range Status Comment   MRSA, PCR NEGATIVE  NEGATIVE Final    Staphylococcus aureus NEGATIVE  NEGATIVE Final     Studies/Results: Dg Abd 1 View  12/26/2011  *RADIOLOGY REPORT*  Clinical Data: Abdominal pain, nausea/vomiting, recent cholecystectomy and partial nephrectomy  ABDOMEN - 1 VIEW  Comparison: CT abdomen pelvis dated 12/24/2011  Findings: Diffuse gaseous distension of small and large bowel, likely reflecting adynamic ileus.  No disproportionate small bowel dilatation to suggest small bowel obstruction.  Cholecystectomy clips.  Surgical drain in the right upper abdomen. Overlying skin staples.  Degenerative changes of the  visualized thoracolumbar spine.  IMPRESSION: Diffuse gaseous distension of small and large bowel, likely reflecting adynamic ileus, unchanged.   Original Report Authenticated By: Charline Bills, M.D.     Assessment/Plan: -no new GU recs -Kidney function improving -discussed path with patient and discussed positive margin status. We discussed significance of a positive margin and nature, risks and benefits of surveillance (local or metastatic recurrence), repeat partial Nephrectomy (technically not feasible as we were deep in the kidney near the hilum at the superior margin), or radical nephrectomy (operative risks, plus long term risks of CRI). We discussed grossly all tumor was removed with deep resection and Argon beam coagulation of remaining parenchyma. All questions answered. He will consider and we will discuss again as his recovery continues.   Antony Haste 12/27/2011, 2:37 PM

## 2011-12-27 NOTE — Progress Notes (Signed)
3 Days Post-Op  Subjective: Had 2 BM  Incisional pain noted  Objective: Vital signs in last 24 hours: Temp:  [97.7 F (36.5 C)-98.4 F (36.9 C)] 98 F (36.7 C) (08/26 0515) Pulse Rate:  [69-93] 69  (08/26 0515) Resp:  [13-20] 13  (08/26 0515) BP: (146-170)/(68-77) 146/74 mmHg (08/26 0515) SpO2:  [95 %-99 %] 99 % (08/26 0515) Last BM Date: 12/26/11  Intake/Output from previous day: 08/25 0701 - 08/26 0700 In: 3115 [P.O.:240; I.V.:2875] Out: 2140 [Urine:1450; Drains:690] Intake/Output this shift:    Resp: clear to auscultation bilaterally Cardio: regular rate and rhythm, S1, S2 normal, no murmur, click, rub or gallop Incision/Wound:intact. JP serous.  T  Tube bile 100 cc  BS present  Lab Results:   Basename 12/27/11 0430 12/26/11 0430  WBC 7.6 7.4  HGB 8.9* 8.8*  HCT 27.4* 26.1*  PLT 307 243   BMET  Basename 12/27/11 0430 12/26/11 0430  NA 135 135  K 3.9 4.1  CL 104 104  CO2 24 24  GLUCOSE 99 164*  BUN 23 30*  CREATININE 1.18 1.26  CALCIUM 7.6* 7.5*   PT/INR No results found for this basename: LABPROT:2,INR:2 in the last 72 hours ABG No results found for this basename: PHART:2,PCO2:2,PO2:2,HCO3:2 in the last 72 hours  Studies/Results: Dg Abd 1 View  12/26/2011  *RADIOLOGY REPORT*  Clinical Data: Abdominal pain, nausea/vomiting, recent cholecystectomy and partial nephrectomy  ABDOMEN - 1 VIEW  Comparison: CT abdomen pelvis dated 12/24/2011  Findings: Diffuse gaseous distension of small and large bowel, likely reflecting adynamic ileus.  No disproportionate small bowel dilatation to suggest small bowel obstruction.  Cholecystectomy clips.  Surgical drain in the right upper abdomen. Overlying skin staples.  Degenerative changes of the visualized thoracolumbar spine.  IMPRESSION: Diffuse gaseous distension of small and large bowel, likely reflecting adynamic ileus, unchanged.   Original Report Authenticated By: Charline Bills, M.D.      Anti-infectives: Anti-infectives     Start     Dose/Rate Route Frequency Ordered Stop   12/24/11 1523   vancomycin (VANCOCIN) 1,500 mg in sodium chloride 0.9 % 500 mL IVPB  Status:  Discontinued        1,500 mg 250 mL/hr over 120 Minutes Intravenous 120 min pre-op 12/24/11 1523 12/24/11 1918   12/23/11 1200   cefOXitin (MEFOXIN) 1 g in dextrose 5 % 50 mL IVPB        1 g 100 mL/hr over 30 Minutes Intravenous Every 8 hours 12/23/11 1037     12/22/11 0900   ciprofloxacin (CIPRO) IVPB 400 mg  Status:  Discontinued        400 mg 200 mL/hr over 60 Minutes Intravenous Every 12 hours 12/22/11 0800 12/23/11 0732   12/20/11 0630   ciprofloxacin (CIPRO) IVPB 400 mg        400 mg 200 mL/hr over 60 Minutes Intravenous 120 min pre-op 12/20/11 0617 12/20/11 0720          Assessment/Plan: s/p Procedure(s) (LRB): EXPLORATORY LAPAROTOMY (N/A) INTRAOPERATIVE CHOLANGIOGRAM (N/A) Resolving ileus Remove NGT Clear liquid diet  LOS: 7 days    Nylan Nevel A. 12/27/2011

## 2011-12-27 NOTE — Progress Notes (Signed)
Hypoglycemic Event  CBG: 66  Treatment: D50 IV 25 mL  Symptoms: None  Follow-up CBG: Time:0420 CBG Result:97  Possible Reasons for Event: Inadequate meal intake  Comments/MD notified:MD notified of hypoglycemic episodes. Orders given.    Frank Ashley  Remember to initiate Hypoglycemia Order Set & complete

## 2011-12-28 LAB — GLUCOSE, CAPILLARY
Glucose-Capillary: 129 mg/dL — ABNORMAL HIGH (ref 70–99)
Glucose-Capillary: 144 mg/dL — ABNORMAL HIGH (ref 70–99)
Glucose-Capillary: 143 mg/dL — ABNORMAL HIGH (ref 70–99)
Glucose-Capillary: 44 mg/dL — CL (ref 70–99)
Glucose-Capillary: 61 mg/dL — ABNORMAL LOW (ref 70–99)
Glucose-Capillary: 121 mg/dL — ABNORMAL HIGH (ref 70–99)

## 2011-12-28 MED ORDER — GLUCOSE-VITAMIN C 4-6 GM-MG PO CHEW
CHEWABLE_TABLET | ORAL | Status: AC
  Administered 2011-12-28: 1
  Filled 2011-12-28: qty 1

## 2011-12-28 MED ORDER — BOOST PLUS PO LIQD
237.0000 mL | Freq: Three times a day (TID) | ORAL | Status: DC
  Administered 2011-12-28 – 2012-01-04 (×3): 237 mL via ORAL
  Filled 2011-12-28 (×27): qty 237

## 2011-12-28 MED ORDER — GLUCOSE-VITAMIN C 4-6 GM-MG PO CHEW
4.0000 | CHEWABLE_TABLET | ORAL | Status: DC | PRN
  Administered 2011-12-28: 2 via ORAL
  Administered 2012-01-02: 4 via ORAL

## 2011-12-28 MED ORDER — HYDRALAZINE HCL 20 MG/ML IJ SOLN: 10.0000 mg | INTRAMUSCULAR | Status: DC | PRN

## 2011-12-28 NOTE — Progress Notes (Signed)
Patient refused ativan as suggested per significant other.  Patient refused CPAP at bedtime.

## 2011-12-28 NOTE — Progress Notes (Signed)
Physical Therapy Treatment Patient Details Name: Frank Ashley MRN: [id number] DOB: 11-24-1940 Today's Date: 12/28/2011 Time: 4132-4401 PT Time Calculation (min): 13 min  PT Assessment / Plan / Recommendation Comments on Treatment Session  Pt. is improving w/ mobility. pt appeared less anxious today. was motivated to ambulate.  Pt will benefit fro HHPT after DC.    Follow Up Recommendations  Home health PT;Skilled nursing facility;Other (comment) (depends on progress and support.)    Barriers to Discharge        Equipment Recommendations  Rolling walker with 5" wheels;3 in 1 bedside comode    Recommendations for Other Services OT consult  Frequency Min 3X/week   Plan Discharge plan needs to be updated;Frequency remains appropriate    Precautions / Restrictions Precautions Precaution Comments: check what last CBG was, drains in abdomen   Pertinent Vitals/Pain Sats 100            5 RA, HR 76 after walk    Mobility  Bed Mobility Bed Mobility: Rolling Right;Right Sidelying to Sit Rolling Right: 5: Supervision;With rail Right Sidelying to Sit: 5: Supervision;With rails;HOB elevated (pt was assisting self to EOB) Sit to Sidelying Right: 4: Min assist Details for Bed Mobility Assistance: Pt required min asssit for LE onto bed. Transfers Transfers: Sit to Stand;Stand to Sit Sit to Stand: 4: Min guard Stand to Sit: 4: Min guard;With upper extremity assist Ambulation/Gait Ambulation/Gait Assistance: 1: +2 Total assist Ambulation/Gait: Patient Percentage: 70% Ambulation Distance (Feet): 33 Feet Assistive device: Rolling walker Ambulation/Gait Assistance Details: Pt. stated he felt light headed so did not increase distance. Pt is standing and using urinal w/ supervision and 1 UE support Gait Pattern: Step-through pattern;Decreased stride length;Wide base of support Gait velocity: decreased    Exercises     PT Diagnosis:    PT Problem List:   PT Treatment Interventions:      PT Goals Acute Rehab PT Goals Pt will go Supine/Side to Sit: Independently;with HOB 0 degrees PT Goal: Supine/Side to Sit - Progress: Progressing toward goal Pt will go Sit to Supine/Side: Independently;with HOB 0 degrees PT Goal: Sit to Supine/Side - Progress: Progressing toward goal Pt will go Sit to Stand: with supervision;with upper extremity assist PT Goal: Sit to Stand - Progress: Progressing toward goal Pt will go Stand to Sit: with supervision PT Goal: Stand to Sit - Progress: Progressing toward goal Pt will Ambulate: 51 - 150 feet;with least restrictive assistive device;with supervision PT Goal: Ambulate - Progress: Progressing toward goal  Visit Information  Last PT Received On: 12/28/11 Assistance Needed: +2    Subjective Data  Subjective: My blood sugar has been low. I might get dizzy   Cognition  Overall Cognitive Status: Appears within functional limits for tasks assessed/performed Arousal/Alertness: Awake/alert Orientation Level: Appears intact for tasks assessed Behavior During Session: [facility] for tasks performed    Balance     End of Session PT - End of Session Activity Tolerance: Patient tolerated treatment well Patient left: with call bell/phone within reach;with family/visitor present Nurse Communication: Mobility status   GP     Rada Hay 12/28/2011, 4:33 PM

## 2011-12-28 NOTE — Progress Notes (Signed)
4 Days Post-Op  Subjective: Incisional pain an issue  Objective: Vital signs in last 24 hours: Temp:  [97.4 F (36.3 C)-98.3 F (36.8 C)] 98.3 F (36.8 C) (08/27 0534) Pulse Rate:  [87-99] 87  (08/27 0534) Resp:  [12-22] 12  (08/27 0534) BP: (165-180)/(67-71) 165/69 mmHg (08/27 0534) SpO2:  [97 %-100 %] 100 % (08/27 0534) FiO2 (%):  [32 %-37 %] 36 % (08/27 0459) Last BM Date: 12/26/11  Intake/Output from previous day: 08/26 0701 - 08/27 0700 In: 2961.7 [P.O.:360; I.V.:2601.7] Out: 3731 [Urine:3200; Drains:530; Stool:1] Intake/Output this shift: Total I/O In: 1525 [I.V.:1525] Out: 1980 [Urine:1750; Drains:230]  Resp: clear to auscultation bilaterally Incision/Wound:clean dry intact.  JP serous T  Tube bile.  Less distended. soft  Lab Results:   Basename 12/27/11 0430 12/26/11 0430  WBC 7.6 7.4  HGB 8.9* 8.8*  HCT 27.4* 26.1*  PLT 307 243   BMET  Basename 12/27/11 0430 12/26/11 0430  NA 135 135  K 3.9 4.1  CL 104 104  CO2 24 24  GLUCOSE 99 164*  BUN 23 30*  CREATININE 1.18 1.26  CALCIUM 7.6* 7.5*   PT/INR No results found for this basename: LABPROT:2,INR:2 in the last 72 hours ABG No results found for this basename: PHART:2,PCO2:2,PO2:2,HCO3:2 in the last 72 hours  Studies/Results: Dg Abd 1 View  12/26/2011  *RADIOLOGY REPORT*  Clinical Data: Abdominal pain, nausea/vomiting, recent cholecystectomy and partial nephrectomy  ABDOMEN - 1 VIEW  Comparison: CT abdomen pelvis dated 12/24/2011  Findings: Diffuse gaseous distension of small and large bowel, likely reflecting adynamic ileus.  No disproportionate small bowel dilatation to suggest small bowel obstruction.  Cholecystectomy clips.  Surgical drain in the right upper abdomen. Overlying skin staples.  Degenerative changes of the visualized thoracolumbar spine.  IMPRESSION: Diffuse gaseous distension of small and large bowel, likely reflecting adynamic ileus, unchanged.   Original Report Authenticated By: Charline Bills, M.D.     Anti-infectives: Anti-infectives     Start     Dose/Rate Route Frequency Ordered Stop   12/24/11 1523   vancomycin (VANCOCIN) 1,500 mg in sodium chloride 0.9 % 500 mL IVPB  Status:  Discontinued        1,500 mg 250 mL/hr over 120 Minutes Intravenous 120 min pre-op 12/24/11 1523 12/24/11 1918   12/23/11 1200   cefOXitin (MEFOXIN) 1 g in dextrose 5 % 50 mL IVPB        1 g 100 mL/hr over 30 Minutes Intravenous Every 8 hours 12/23/11 1037     12/22/11 0900   ciprofloxacin (CIPRO) IVPB 400 mg  Status:  Discontinued        400 mg 200 mL/hr over 60 Minutes Intravenous Every 12 hours 12/22/11 0800 12/23/11 0732   12/20/11 0630   ciprofloxacin (CIPRO) IVPB 400 mg        400 mg 200 mL/hr over 60 Minutes Intravenous 120 min pre-op 12/20/11 0617 12/20/11 0720          Assessment/Plan: s/p Procedure(s) (LRB): EXPLORATORY LAPAROTOMY (N/A) INTRAOPERATIVE CHOLANGIOGRAM (N/A) Patient Active Problem List  Diagnosis  . MRSA  . DM w/o complication type II  . DIABETIC FOOT ULCER, TOE  . HYPERLIPIDEMIA  . Overweight  . PERIPHERAL NEUROPATHY  . Allergic rhinitis, cause unspecified  . GERD  . GALLSTONES  . ACUTE PANCREATITIS  . RENAL INSUFFICIENCY, CHRONIC  . SLEEP APNEA  . Sepsis  . Ascending cholangitis  . Bacteremia due to Escherichia coli  . Right renal mass  . Choledocholithiasis  .  Cholecystitis chronic  . Cholecystitis, unspecified  . Gall stones, common bile duct  . Preop cardiovascular exam  . CAD (coronary artery disease)  . Hypertension  . Diabetes mellitus type 2, insulin dependent  . Hyperlipidemia  . Ejection fraction  . Acute respiratory failure  . Acute renal failure  looks better. Bowels moving.   Continue antibiotics for 5 days for T tube . Cr better. Protein calorie malnutrition severe   Advance diet add boost. Cont PT/OT Wound looks clean.  LOS: 8 days    Ayaan Ringle A. 12/28/2011

## 2011-12-28 NOTE — Progress Notes (Signed)
RT checked on patient to see when he would be ready for CPAP tonight.  Patient will have RN call when he wants CPAP on.

## 2011-12-28 NOTE — Progress Notes (Signed)
Patient ID: Frank Ashley, male   DOB: 09-09-1940, 71 y.o.   MRN: [id number]  Pt without specific complains. Feels "OK".  NAD Abd - soft, inc C/D/I. JP minimal output. Serous. Ext - no CCE    Assessment/Plan:  s/p Procedure(s) (LRB):  EXPLORATORY LAPAROTOMY (N/A)  INTRAOPERATIVE CHOLANGIOGRAM (N/A)  RIGHT PARTIAL NEPHRECTOMY Patient Active Problem List   Diagnosis   .  MRSA   .  DM w/o complication type II   .  DIABETIC FOOT ULCER, TOE   .  HYPERLIPIDEMIA   .  Overweight   .  PERIPHERAL NEUROPATHY   .  Allergic rhinitis, cause unspecified   .  GERD   .  GALLSTONES   .  ACUTE PANCREATITIS   .  RENAL INSUFFICIENCY, CHRONIC   .  SLEEP APNEA   .  Sepsis   .  Ascending cholangitis   .  Bacteremia due to Escherichia coli   .  Right renal mass   .  Choledocholithiasis   .  Cholecystitis chronic   .  Cholecystitis, unspecified   .  Gall stones, common bile duct   .  Preop cardiovascular exam   .  CAD (coronary artery disease)   .  Hypertension   .  Diabetes mellitus type 2, insulin dependent   .  Hyperlipidemia   .  Ejection fraction   .  Acute respiratory failure   .  Acute renal failure      --discussed again with patient his path and significance of positive surgical margin. Discussed again nature, R/B of surveillance, repeat partial nx or proceeding with radical nx. All questions answered. We will plan close surveillance with a CT Kidneys in 3-6 months.

## 2011-12-28 NOTE — Progress Notes (Signed)
0900- pt given 40u 70/30 insulin as ordered.  Pt later expressed concern about amount of insulin and requested cbg to be taken.   1030 cbg 143 1205 cbg  78 1415  cbg 40   hypoglemic protocol started.  Pt had soda drink and 2 glucose tablets per pt request. 1500 cbg  61  Soda drink and 1 tablet of glucose given per pt's request 1635  86    Pt would like to speak with attending Dr. About 70/30 insulin order.

## 2011-12-29 LAB — GLUCOSE, CAPILLARY
Glucose-Capillary: 186 mg/dL — ABNORMAL HIGH (ref 70–99)
Glucose-Capillary: 216 mg/dL — ABNORMAL HIGH (ref 70–99)
Glucose-Capillary: 221 mg/dL — ABNORMAL HIGH (ref 70–99)

## 2011-12-29 LAB — COMPREHENSIVE METABOLIC PANEL
Alkaline Phosphatase: 86 U/L (ref 39–117)
BUN: 11 mg/dL (ref 6–23)
CO2: 28 mEq/L (ref 19–32)
Chloride: 100 mEq/L (ref 96–112)
Creatinine, Ser: 1.13 mg/dL (ref 0.50–1.35)
GFR calc Af Amer: 74 mL/min — ABNORMAL LOW (ref >90)
GFR calc non Af Amer: 64 mL/min — ABNORMAL LOW (ref >90)
Glucose, Bld: 182 mg/dL — ABNORMAL HIGH (ref 70–99)
Potassium: 3.6 mEq/L (ref 3.5–5.1)
Total Bilirubin: 0.5 mg/dL (ref 0.3–1.2)

## 2011-12-29 LAB — CBC
HCT: 27.6 % — ABNORMAL LOW (ref 39.0–52.0)
Hemoglobin: 8.8 g/dL — ABNORMAL LOW (ref 13.0–17.0)
MCV: 77.5 fL — ABNORMAL LOW (ref 78.0–100.0)
WBC: 7.5 10*3/uL (ref 4.0–10.5)

## 2011-12-29 MED ORDER — LORAZEPAM 2 MG/ML IJ SOLN: 0.5000 mg | INTRAMUSCULAR | Status: DC | PRN

## 2011-12-29 MED ORDER — HYDRALAZINE HCL 20 MG/ML IJ SOLN: 20.0000 mg | INTRAMUSCULAR | Status: DC | PRN

## 2011-12-29 MED ORDER — LORAZEPAM 0.5 MG PO TABS: 0.5000 mg | ORAL_TABLET | ORAL | Status: DC | PRN

## 2011-12-29 MED ORDER — LORAZEPAM 0.5 MG PO TABS
0.5000 mg | ORAL_TABLET | ORAL | Status: DC | PRN
  Administered 2011-12-29 – 2012-01-01 (×10): 0.5 mg via ORAL
  Filled 2011-12-29 (×12): qty 1

## 2011-12-29 MED ORDER — HYDROMORPHONE HCL PF 1 MG/ML IJ SOLN
1.0000 mg | INTRAMUSCULAR | Status: DC | PRN
  Administered 2011-12-29 – 2011-12-31 (×6): 1 mg via INTRAVENOUS
  Filled 2011-12-29 (×6): qty 1

## 2011-12-29 MED ORDER — HYDROMORPHONE HCL 2 MG PO TABS
1.0000 mg | ORAL_TABLET | ORAL | Status: DC | PRN
  Filled 2011-12-29 (×2): qty 1

## 2011-12-29 NOTE — Progress Notes (Signed)
Clinical Social Work Department CLINICAL SOCIAL WORK PLACEMENT NOTE 12/29/2011  Patient:  Frank Ashley, Frank Ashley  Account Number:  [id number] Admit date:  12/20/2011  Clinical Social Worker:  Cori Razor, LCSW  Date/time:  12/29/2011 04:04 PM  Clinical Social Work is seeking post-discharge placement for this patient at the following level of care:   SKILLED NURSING   (*CSW will update this form in Epic as items are completed)   12/29/2011  Patient/family provided with [facility] Department of Clinical Social Work's list of facilities offering this level of care within the geographic area requested by the patient (or if unable, by the patient's family).  12/29/2011  Patient/family informed of their freedom to choose among providers that offer the needed level of care, that participate in Medicare, Medicaid or managed care program needed by the patient, have an available bed and are willing to accept the patient.    Patient/family informed of [facility]' ownership interest in [facility], as well as of the fact that they are under no obligation to receive care at this facility.  PASARR submitted to EDS on 12/28/2011 PASARR number received from EDS on 12/28/2011  FL2 transmitted to all facilities in geographic area requested by pt/family on  12/28/2011 FL2 transmitted to all facilities within larger geographic area on 12/29/2011  Patient informed that his/her managed care company has contracts with or will negotiate with  certain facilities, including the following:     Patient/family informed of bed offers received:  12/29/2011 Patient chooses bed at  Physician recommends and patient chooses bed at    Patient to be transferred to  on   Patient to be transferred to facility by   The following physician request were entered in Epic:   Additional Comments:  Cori Razor LCSW [phone]

## 2011-12-29 NOTE — Progress Notes (Signed)
Physical Therapy Treatment Patient Details Name: Frank Ashley MRN: [id number] DOB: 11-27-1940 Today's Date: 12/29/2011 Time: 4098-1191 PT Time Calculation (min): 21 min  PT Assessment / Plan / Recommendation Comments on Treatment Session  Continuing to progress well. However pt will need to be modified independent for safe d/c home. Continues to require +2 for safety during ambulation due to lightheadedness, fatigue. Feel ST rehab would still be beneficial at this time. Will continue to follow. Pt is planning for home on Friday.     Follow Up Recommendations  Skilled nursing facility (HHPT only if pt continues to progress well and appropriate assist available at home)    Barriers to Discharge        Equipment Recommendations  Rolling walker with 5" wheels;3 in 1 bedside comode    Recommendations for Other Services  OT consult  Frequency Min 3X/week   Plan Discharge plan remains appropriate    Precautions / Restrictions Precautions Precautions: Fall Precaution Comments: abdominal drain Restrictions Weight Bearing Restrictions: No   Pertinent Vitals/Pain     Mobility  Bed Mobility Bed Mobility: Supine to Sit;Sit to Supine Supine to Sit: HOB elevated;4: Min guard;With rails Sit to Supine: 4: Min guard;HOB elevated;With rail Details for Bed Mobility Assistance: Heavy relilance on rail. HOB 60-70 degrees.  Transfers Transfers: Sit to Stand;Stand to Sit Sit to Stand: 4: Min assist;With upper extremity assist;From bed Stand to Sit: 4: Min guard;With upper extremity assist;To bed Details for Transfer Assistance: VCs safety, technique, hand placement. Assist to stabilize RW during sit>stand transition.  Ambulation/Gait Ambulation/Gait Assistance: 1: +2 Total assist Ambulation/Gait: Patient Percentage: 80% Ambulation Distance (Feet): 75 Feet (75'x1, 60'x1) Assistive device: Rolling walker Ambulation/Gait Assistance Details: Assist to stabilize intermittently. +2 for safety,  chair follow. Seated rest break needed due to fatigue, lightheadedness. VCs safety, distance from RW.  Gait Pattern: Step-through pattern;Decreased stride length;Wide base of support    Exercises     PT Diagnosis:    PT Problem List:   PT Treatment Interventions:     PT Goals Acute Rehab PT Goals Pt will go Supine/Side to Sit: Independently PT Goal: Supine/Side to Sit - Progress: Progressing toward goal Pt will go Sit to Supine/Side: Independently PT Goal: Sit to Supine/Side - Progress: Progressing toward goal Pt will go Sit to Stand: with supervision PT Goal: Sit to Stand - Progress: Progressing toward goal Pt will go Stand to Sit: with supervision PT Goal: Stand to Sit - Progress: Progressing toward goal Pt will Ambulate: 51 - 150 feet;with least restrictive assistive device;with supervision PT Goal: Ambulate - Progress: Progressing toward goal  Visit Information  Last PT Received On: 12/29/11 Assistance Needed: +1    Subjective Data  Subjective: "This is the furthest I have walked. I have to get ready for home on Friday" Patient Stated Goal: Home   Cognition  Overall Cognitive Status: Appears within functional limits for tasks assessed/performed Arousal/Alertness: Awake/alert Orientation Level: Appears intact for tasks assessed Behavior During Session: [facility] for tasks performed    Balance     End of Session PT - End of Session Activity Tolerance: Patient tolerated treatment well Patient left: in bed;with call bell/phone within reach   GP     Rebeca Alert [facility] 12/29/2011, 2:13 PM [phone]

## 2011-12-29 NOTE — Progress Notes (Signed)
Patient ID: Frank Ashley, male   DOB: Oct 30, 1940, 71 y.o.   MRN: [id number]  Pt without complaint. Passing flatus.   Cr 1.1 H/H stable  -F/u with Urology PA in 2-3 weeks.  -I'll see him in 3 months to plan surveillance CT scan

## 2011-12-29 NOTE — Progress Notes (Signed)
CSW is available to assist with d/c planning. PN reviewed. Will plan to meet with pt this am to review PT recommendations. Pt may benefit from ST SNF placement following hospital d/c.  Cori Razor LCSW [phone]

## 2011-12-29 NOTE — Progress Notes (Signed)
5 Days Post-Op  Subjective: Looks better wants less ativan  Objective: Vital signs in last 24 hours: Temp:  [97.4 F (36.3 C)-98.5 F (36.9 C)] 97.7 F (36.5 C) (08/28 0511) Pulse Rate:  [77-92] 92  (08/28 0511) Resp:  [12-18] 15  (08/28 0746) BP: (150-176)/(70-88) 170/73 mmHg (08/28 0511) SpO2:  [93 %-100 %] 99 % (08/28 0746) Last BM Date: 12/26/11  Intake/Output from previous day: 08/27 0701 - 08/28 0700 In: 800 [I.V.:750; IV Piggyback:50] Out: 2335 [Urine:1725; Drains:610] Intake/Output this shift: Total I/O In: -  Out: 300 [Urine:300]  Incision/Wound:staples intact no drainage  T tube with bile  JP serous minimal in bulb  Lab Results:   Basename 12/29/11 0021 12/27/11 0430  WBC 7.5 7.6  HGB 8.8* 8.9*  HCT 27.6* 27.4*  PLT 384 307   BMET  Basename 12/29/11 0021 12/27/11 0430  NA 135 135  K 3.6 3.9  CL 100 104  CO2 28 24  GLUCOSE 182* 99  BUN 11 23  CREATININE 1.13 1.18  CALCIUM 7.8* 7.6*   PT/INR No results found for this basename: LABPROT:2,INR:2 in the last 72 hours ABG No results found for this basename: PHART:2,PCO2:2,PO2:2,HCO3:2 in the last 72 hours  Studies/Results: No results found.  Anti-infectives: Anti-infectives     Start     Dose/Rate Route Frequency Ordered Stop   12/24/11 1523   vancomycin (VANCOCIN) 1,500 mg in sodium chloride 0.9 % 500 mL IVPB  Status:  Discontinued        1,500 mg 250 mL/hr over 120 Minutes Intravenous 120 min pre-op 12/24/11 1523 12/24/11 1918   12/23/11 1200   cefOXitin (MEFOXIN) 1 g in dextrose 5 % 50 mL IVPB        1 g 100 mL/hr over 30 Minutes Intravenous Every 8 hours 12/23/11 1037     12/22/11 0900   ciprofloxacin (CIPRO) IVPB 400 mg  Status:  Discontinued        400 mg 200 mL/hr over 60 Minutes Intravenous Every 12 hours 12/22/11 0800 12/23/11 0732   12/20/11 0630   ciprofloxacin (CIPRO) IVPB 400 mg        400 mg 200 mL/hr over 60 Minutes Intravenous 120 min pre-op 12/20/11 0617 12/20/11 0720          Assessment/Plan: s/p Procedure(s) (LRB): EXPLORATORY LAPAROTOMY (N/A) INTRAOPERATIVE CHOLANGIOGRAM (N/A) Advance diet DM II better control HTN  Hydralazine PT/OT Needs to move more   LOS: 9 days    Frank Ashley A. 12/29/2011

## 2011-12-29 NOTE — Progress Notes (Signed)
Clinical Social Work Department BRIEF PSYCHOSOCIAL ASSESSMENT 12/29/2011  Patient:  Frank Ashley, Frank Ashley     Account Number:  [id number]     Admit date:  12/20/2011  Clinical Social Worker:  Candie Chroman  Date/Time:  12/29/2011 03:55 PM  Referred by:  Care Management  Date Referred:  12/28/2011 Referred for  SNF Placement   Other Referral:   Interview type:  Patient Other interview type:    PSYCHOSOCIAL DATA Living Status:  WIFE Admitted from facility:   Level of care:   Primary support name:  Rushie Chestnut Primary support relationship to patient:  SPOUSE Degree of support available:   supportive    CURRENT CONCERNS Current Concerns  Post-Acute Placement   Other Concerns:    SOCIAL WORK ASSESSMENT / PLAN Pt is a 71 yr old gentleman living at home with spouse prior to hospitalization. CSW and RNCM met with pt/family to assist with d/c planning. Pt hopes to return home when stable but is willing to consider ST rehab if needed. CSW has initiated SNF search and has provided some bed offers. Family requested a SNF search in [facility] as well as [place] which has been done. Will continue to provide bed offers as received. [employer] has been contacted to begin prior auth process in case SNF is needed.   Assessment/plan status:  Psychosocial Support/Ongoing Assessment of Needs Other assessment/ plan:   HH Services   Information/referral to community resources:   SNF list provided.    PATIENT'S/FAMILY'S RESPONSE TO PLAN OF CARE: Pt would prefer to return home with [facility] but is considering ST rehab if needed.   Cori Razor LCSW [phone]

## 2011-12-29 NOTE — Progress Notes (Addendum)
Inpatient Diabetes Program Recommendations  AACE/ADA: New Consensus Statement on Inpatient Glycemic Control (2013)  Target Ranges:  Prepandial:   less than 140 mg/dL      Peak postprandial:   less than 180 mg/dL (1-2 hours)      Critically ill patients:  140 - 180 mg/dL   Patient has not been receiving doses of 70/30 as ordered.  A dose was given yesterday at 8 a.m. And resulted in HYPOglycemia CBG=44.  Inpatient Diabetes Program Recommendations Insulin - Basal: Decrease 70/30 dose to 20 units  Also, may do better with less frequent Novolog doses. Change to TID ac instead of Q 4 (6xday) Will follow.  Thank you  Piedad Climes RN,BSN,CDE Inpatient Diabetes Coordinator [phone] (team pager)

## 2011-12-29 NOTE — Progress Notes (Signed)
Noted pt IV site out of date asked patient to do IV restart patient refused stated he does not want to be stuck again and he plans on IV being removed tomorrow. Advised patient of the risks and he stated he will wait until tomorrow to find out if the IV will be discontinued. Nakota Elsen RN

## 2011-12-30 ENCOUNTER — Encounter (HOSPITAL_COMMUNITY): Payer: Self-pay

## 2011-12-30 LAB — GLUCOSE, CAPILLARY
Glucose-Capillary: 111 mg/dL — ABNORMAL HIGH (ref 70–99)
Glucose-Capillary: 198 mg/dL — ABNORMAL HIGH (ref 70–99)

## 2011-12-30 MED ORDER — INSULIN ASPART PROT & ASPART (70-30 MIX) 100 UNIT/ML ~~LOC~~ SUSP
20.0000 [IU] | Freq: Two times a day (BID) | SUBCUTANEOUS | Status: DC
  Administered 2011-12-30 – 2012-01-02 (×6): 20 [IU] via SUBCUTANEOUS

## 2011-12-30 NOTE — Progress Notes (Signed)
Physical Therapy Treatment Patient Details Name: Frank Ashley MRN: [id number] DOB: 1940-12-29 Today's Date: 12/30/2011 Time: 5366-4403 PT Time Calculation (min): 26 min  PT Assessment / Plan / Recommendation Comments on Treatment Session  pt with significant pain today.  He is imprvoving slowly, He will need 24/7 supevision initially at D/C    Follow Up Recommendations  Skilled nursing facility;Other (comment) (HHPT if pt continues to progress well and appropriate assist)    Barriers to Discharge        Equipment Recommendations  Rolling walker with 5" wheels;3 in 1 bedside comode (wide RW)    Recommendations for Other Services OT consult  Frequency Min 3X/week   Plan Discharge plan remains appropriate    Precautions / Restrictions Precautions Precautions: Fall Precaution Comments: 2 abdominal drains Restrictions Weight Bearing Restrictions: No   Pertinent Vitals/Pain Pt with c/o pain at right side/back today    Mobility  Bed Mobility Bed Mobility: Sit to Supine Rolling Right: 5: Supervision Supine to Sit: HOB elevated;4: Min guard;With rails Sit to Supine: 4: Min guard;HOB elevated;With rail Details for Bed Mobility Assistance: Heavy relilance on rail. HOB 60-70 degrees.  Transfers Transfers: Sit to Stand;Stand to Sit Sit to Stand: 4: Min assist;With upper extremity assist;From chair/3-in-1 (pt was standing up in room upon my arrival.) Stand to Sit: 4: Min guard;With upper extremity assist;To bed;To chair/3-in-1 Details for Transfer Assistance: VCs safety, technique, hand placement. Assist to stabilize RW during sit>stand transition.  Ambulation/Gait Ambulation/Gait Assistance: 4: Min assist Ambulation Distance (Feet): 150 Feet (75x2 with one standing rest break) Assistive device: Rolling walker Ambulation/Gait Assistance Details: Pt was up on his feet, beginning to ambulate with student nurse upon my arrival.  Pt c/o pain in right side/back througout session RN  aware Gait Pattern: Step-through pattern;Decreased stride length;Wide base of support;Trunk flexed;Antalgic Gait velocity: decreased General Gait Details: pt maintains O2 sats througout session and is able to progress distance, but he is limited by pain today Stairs: No Wheelchair Mobility Wheelchair Mobility: No    Exercises     PT Diagnosis:    PT Problem List:   PT Treatment Interventions:     PT Goals Acute Rehab PT Goals Pt will go Supine/Side to Sit: Independently Pt will go Sit to Supine/Side: Independently PT Goal: Sit to Supine/Side - Progress: Progressing toward goal Pt will go Sit to Stand: with supervision PT Goal: Sit to Stand - Progress: Progressing toward goal Pt will go Stand to Sit: with supervision PT Goal: Stand to Sit - Progress: Progressing toward goal Pt will Ambulate: 51 - 150 feet;with least restrictive assistive device;with supervision PT Goal: Ambulate - Progress: Progressing toward goal  Visit Information  Last PT Received On: 12/30/11 Assistance Needed: +1    Subjective Data  Subjective: My side hurts.  I hope its not something else Patient Stated Goal: to have relief from pain   Cognition  Overall Cognitive Status: Appears within functional limits for tasks assessed/performed Arousal/Alertness: Awake/alert Orientation Level: Appears intact for tasks assessed Behavior During Session: [facility] for tasks performed    Balance     End of Session PT - End of Session Activity Tolerance: Patient limited by pain Patient left: in bed;with call bell/phone within reach Nurse Communication: Patient requests pain meds   GP     Rosey Bath K. Manson Passey, [place] [phone] 12/30/2011, 11:31 AM

## 2011-12-30 NOTE — Progress Notes (Signed)
CSW met with pt/spouse this afternoon to assist with d/c planning. Pt/spouse have declined ST SNF placement at this time. CSW has provided pt/spouse with ST SNF offers but no choice has been made ( incase SNF is needed ). CSW will remain available to assist if plan changes and SNF placement is requested. Blue Medicare requires prior auth for SNF placement. RNCM is assisting with d/c planning to home.  Cori Razor LCSW [phone]

## 2011-12-30 NOTE — Progress Notes (Signed)
Placed pt. On cpap, auto-titrate as per order. Pt. Also has 2L of oxygen with the cpap.

## 2011-12-30 NOTE — Progress Notes (Signed)
6 Days Post-Op  Subjective: Pt with good BM  Objective: Vital signs in last 24 hours: Temp:  [97.6 F (36.4 C)-99 F (37.2 C)] 99 F (37.2 C) (08/28 2200) Pulse Rate:  [93-99] 99  (08/28 2200) Resp:  [14-18] 18  (08/28 2200) BP: (152-187)/(60-80) 152/60 mmHg (08/29 0200) SpO2:  [93 %-99 %] 96 % (08/28 2200) Last BM Date: 12/26/11  Intake/Output from previous day: 08/28 0701 - 08/29 0700 In: 2000 [P.O.:600; I.V.:1150; IV Piggyback:250] Out: 3470 [Urine:3150; Drains:320] Intake/Output this shift: Total I/O In: 1120 [P.O.:120; I.V.:750; IV Piggyback:250] Out: 2015 [Urine:1950; Drains:65]  Incision/Wound: MIN DRAINAGE CLEAN INCISION.  T TUBE BILIOUS AND JP SEROUS  Lab Results:   Basename 12/29/11 0021  WBC 7.5  HGB 8.8*  HCT 27.6*  PLT 384   BMET  Basename 12/29/11 0021  NA 135  K 3.6  CL 100  CO2 28  GLUCOSE 182*  BUN 11  CREATININE 1.13  CALCIUM 7.8*   PT/INR No results found for this basename: LABPROT:2,INR:2 in the last 72 hours ABG No results found for this basename: PHART:2,PCO2:2,PO2:2,HCO3:2 in the last 72 hours  Studies/Results: No results found.  Anti-infectives: Anti-infectives     Start     Dose/Rate Route Frequency Ordered Stop   12/24/11 1523   vancomycin (VANCOCIN) 1,500 mg in sodium chloride 0.9 % 500 mL IVPB  Status:  Discontinued        1,500 mg 250 mL/hr over 120 Minutes Intravenous 120 min pre-op 12/24/11 1523 12/24/11 1918   12/23/11 1200   cefOXitin (MEFOXIN) 1 g in dextrose 5 % 50 mL IVPB        1 g 100 mL/hr over 30 Minutes Intravenous Every 8 hours 12/23/11 1037     12/22/11 0900   ciprofloxacin (CIPRO) IVPB 400 mg  Status:  Discontinued        400 mg 200 mL/hr over 60 Minutes Intravenous Every 12 hours 12/22/11 0800 12/23/11 0732   12/20/11 0630   ciprofloxacin (CIPRO) IVPB 400 mg        400 mg 200 mL/hr over 60 Minutes Intravenous 120 min pre-op 12/20/11 0617 12/20/11 0720          Assessment/Plan: s/p Procedure(s)  (LRB): EXPLORATORY LAPAROTOMY (N/A) INTRAOPERATIVE CHOLANGIOGRAM (N/A) NEEDS iv ACCESSS. D/c  Planning Labs in AM. Hospital bed at home due to abdominal incision and for safety [place]  c and s  LOS: 10 days    Frank Korte A. 12/30/2011

## 2011-12-30 NOTE — Care Management Note (Addendum)
    Page 1 of 2   01/04/2012     2:47:16 PM   CARE MANAGEMENT NOTE 01/04/2012  Patient:  Frank, Ashley   Account Number:  [id number]  Date Initiated:  12/21/2011  Documentation initiated by:  DAVIS,RHONDA  Subjective/Objective Assessment:   pt had surg cholecystectomy and renal mass excision, was extubated in pacu then became hypoxic and required reintubation.     Action/Plan:   lives at home   Anticipated DC Date:  01/01/2012   Anticipated DC Plan:  HOME W HOME HEALTH SERVICES  In-house referral  NA      DC Planning Services  CM consult      [facility] Choice  HOME HEALTH  DURABLE MEDICAL EQUIPMENT   Choice offered to / List presented to:  C-1 Patient   DME arranged  3-N-1  HOSPITAL BED  WALKER - ROLLING  CPAP      DME agency  [facility]     HH arranged  HH-1 RN  HH-2 PT  HH-3 OT      [facility] agency  [facility]   Status of service:  Completed, signed off Medicare Important Message given?  NA - LOS <3 / Initial given by admissions (If response is "NO", the following Medicare IM given date fields will be blank) Date Medicare IM given:   Date Additional Medicare IM given:    Discharge Disposition:  HOME W HOME HEALTH SERVICES  Per UR Regulation:  Reviewed for med. necessity/level of care/duration of stay  If discussed at Long Length of Stay Meetings, dates discussed:    Comments:  01-04-12 Lorenda Ishihara RN CM 1400 Spoke with patient at bedside. Now requesting cane in place of walker. Per PT notes he did well with therapy and cane. Discussed with [facility], they do not have a cane that is rated for his size. They have the appropriate canes at their local store. Per [name] with [facility], so the patient will have access to a device at d/c, he will have to return the walker to the local [facility] DME store and they will credit his acct and supply him with a cane.  12-31-11 Lorenda Ishihara RN CM [phone] Spoke with patient again today at bedside. Feels better about d/c  home than previously, states wife has stayed with hime and feels good about taking him home. [facility] aware of anticipated d/c date of 9/1, awaiting orders still for DME requested.  12-30-11 Lorenda Ishihara RN CM 1400 RNCM and CSW spent considerable amount of time with patient and wife regarding d/c options. Discussed SNF for rehab vs home with Home Health. Wife unsure she can mange at home, waxes and wanes with decision. Patient wants to go home but will consider SNF as last resort. Requesting 3-in-1, hospital bed and rolling walker. Will need RN for drain care and PT/OT for deconditioning. Patient did not have a preferrence on agency. Contacted Darl Pikes with [facility] to assist with HH needs, spoke with [place] about DME. Pulmonary had also requested equipment for patient's CPAP, [facility] aware. Will continue to follow for d/c needs.  [id number] Earlene Plater, RN, BSN, CCM: CHART REVIEWED AND UPDATED. NO DISCHARGE NEEDS PRESENT AT THIS TIME. CASE MANAGEMENT [phone]

## 2011-12-31 ENCOUNTER — Encounter (HOSPITAL_COMMUNITY): Payer: Self-pay | Admitting: Surgery

## 2011-12-31 ENCOUNTER — Encounter (INDEPENDENT_AMBULATORY_CARE_PROVIDER_SITE_OTHER): Payer: Medicare Other | Admitting: Surgery

## 2011-12-31 LAB — COMPREHENSIVE METABOLIC PANEL
Albumin: 2.2 g/dL — ABNORMAL LOW (ref 3.5–5.2)
BUN: 8 mg/dL (ref 6–23)
CO2: 27 mEq/L (ref 19–32)
Chloride: 101 mEq/L (ref 96–112)
Creatinine, Ser: 1.16 mg/dL (ref 0.50–1.35)
GFR calc non Af Amer: 62 mL/min — ABNORMAL LOW (ref >90)
Total Bilirubin: 0.6 mg/dL (ref 0.3–1.2)

## 2011-12-31 LAB — CBC
HCT: 25.7 % — ABNORMAL LOW (ref 39.0–52.0)
MCV: 76.3 fL — ABNORMAL LOW (ref 78.0–100.0)
RDW: 17.4 % — ABNORMAL HIGH (ref 11.5–15.5)
WBC: 8.6 10*3/uL (ref 4.0–10.5)

## 2011-12-31 LAB — GLUCOSE, CAPILLARY
Glucose-Capillary: 115 mg/dL — ABNORMAL HIGH (ref 70–99)
Glucose-Capillary: 118 mg/dL — ABNORMAL HIGH (ref 70–99)
Glucose-Capillary: 144 mg/dL — ABNORMAL HIGH (ref 70–99)
Glucose-Capillary: 87 mg/dL (ref 70–99)

## 2011-12-31 LAB — URINALYSIS, ROUTINE W REFLEX MICROSCOPIC
Leukocytes, UA: NEGATIVE
Protein, ur: NEGATIVE mg/dL
Urobilinogen, UA: 0.2 mg/dL (ref 0.0–1.0)

## 2011-12-31 MED ORDER — POLYETHYLENE GLYCOL 3350 17 G PO PACK: 17.0000 g | PACK | Freq: Every day | ORAL | Status: AC

## 2011-12-31 MED ORDER — HYDROMORPHONE HCL 2 MG PO TABS
2.0000 mg | ORAL_TABLET | Freq: Once | ORAL | Status: AC
  Administered 2011-12-31: 2 mg via ORAL

## 2011-12-31 MED ORDER — METOCLOPRAMIDE HCL 10 MG PO TABS: 10.0000 mg | ORAL_TABLET | Freq: Four times a day (QID) | ORAL | Status: DC | PRN

## 2011-12-31 MED ORDER — AMOXICILLIN-POT CLAVULANATE 875-125 MG PO TABS
1.0000 | ORAL_TABLET | Freq: Two times a day (BID) | ORAL | Status: DC
  Administered 2011-12-31 – 2012-01-05 (×11): 1 via ORAL
  Filled 2011-12-31 (×12): qty 1

## 2011-12-31 MED ORDER — AMOXICILLIN-POT CLAVULANATE 875-125 MG PO TABS: 1.0000 | ORAL_TABLET | Freq: Two times a day (BID) | ORAL | Status: DC

## 2011-12-31 NOTE — Progress Notes (Signed)
Occupational Therapy Treatment Patient Details Name: Frank Ashley MRN: [id number] DOB: [birth year] Today's Date: 12/31/2011 Time: 1530-1550 OT Time Calculation (min): 20 min  OT Assessment / Plan / Recommendation Comments on Treatment Session      Follow Up Recommendations  Home health OT    Barriers to Discharge       Equipment Recommendations  Rolling walker with 5" wheels;3 in 1 bedside comode    Recommendations for Other Services    Frequency Min 2X/week   Plan      Precautions / Restrictions Precautions Precautions: Fall Precaution Comments: 2 abdominal drains Restrictions Weight Bearing Restrictions: No   Pertinent Vitals/Pain No c/o pain    ADL    ADL Comments: written HEP given with blue theraband:  triceps and horizontal abduction:  see below    OT Diagnosis: Generalized weakness  OT Problem List: Decreased strength;Decreased activity tolerance;Pain;Decreased knowledge of use of DME or AE OT Treatment Interventions: Self-care/ADL training;Therapeutic activities;Patient/family education;DME and/or AE instruction   OT Goals Acute Rehab OT Goals OT Goal Formulation: With patient Time For Goal Achievement: 01/14/12 Potential to Achieve Goals: Good  Arm Goals Additional Arm Goal #1: pt will be independent with UE HEP (below 90 degrees until drains removed) Arm Goal: Additional Goal #1 - Progress: Progressing toward goals   Visit Information  Last OT Received On: 12/31/11 Assistance Needed: +1       Prior Functioning  Home Living Lives With: Spouse Bathroom Shower/Tub: Walk-in Stage manager: Standard Prior Function Level of Independence: Independent Communication Communication: No difficulties Dominant Hand: Right    Cognition  Overall Cognitive Status: Appears within functional limits for tasks assessed/performed Arousal/Alertness: Awake/alert Orientation Level: Appears intact for tasks assessed Behavior During Session: [facility] for tasks  performed    Mobility  Shoulder Instructions Bed Mobility Bed Mobility: Sit to Supine Rolling Right: 5: Supervision;With rail       Exercises  General Exercises - Upper Extremity Shoulder Horizontal ABduction: Strengthening;Theraband (below 90; without pulling on incisions) Elbow Extension: Strengthening;Theraband;10 reps (band on bed; straightening elbows) s   Balance    End of Session OT - End of Session Activity Tolerance: Patient tolerated treatment well Patient left: in bed;with call bell/phone within reach  GO     Frank Ashley 12/31/2011, 4:10 PM Marica Otter, OTR/L [phone] 12/31/2011

## 2011-12-31 NOTE — Progress Notes (Signed)
Inpatient Diabetes Program Recommendations  AACE/ADA: New Consensus Statement on Inpatient Glycemic Control (2013)  Target Ranges:  Prepandial:   less than 140 mg/dL      Peak postprandial:   less than 180 mg/dL (1-2 hours)      Critically ill patients:  140 - 180 mg/dL   Reason for Visit: Results for Frank Ashley, Frank Ashley (MRN [id number]) as of 12/31/2011 12:08  Ref. Range 12/30/2011 20:05 12/30/2011 23:46 12/31/2011 03:39 12/31/2011 03:56 12/31/2011 07:55  Glucose-Capillary Latest Range: 70-99 mg/dL 409 (H) 811 (H) 914 (H)  202 (H)   CBG's improved.  May need slight increase in AM dose of 70/30 to 26 units q AM.  Also consider decreasing Novolog correction sensitivity to moderate tid with meals (instead of q 4 hours).

## 2011-12-31 NOTE — Progress Notes (Signed)
Patient ID: Frank Ashley, male   DOB: [birth year], 71 y.o.   MRN: [id number]  Pt feeling better. No complaints.   H/H, Cr stable.   I went over path again with patient. He had questions about how often we would do surveillance, treatment of local recurrence and treatment of metastatic recurrence. We went over all the scenario's in detail. We talked about proceeding with another partial or radical nephrectomy and he would like to continue surveillance. I'll get a CT in 3-4 months to assess the kidneys.

## 2011-12-31 NOTE — Progress Notes (Signed)
7 Days Post-Op  Subjective: Looks and feels better  Objective: Vital signs in last 24 hours: Temp:  [98 F (36.7 C)-100 F (37.8 C)] 99.1 F (37.3 C) (08/30 0600) Pulse Rate:  [84-90] 90  (08/30 0600) Resp:  [18-20] 20  (08/30 0600) BP: (152-154)/(55-71) 152/55 mmHg (08/30 0600) SpO2:  [96 %-100 %] 98 % (08/30 0600) Last BM Date: 12/29/11  Intake/Output from previous day: 08/29 0701 - 08/30 0700 In: 1196.7 [P.O.:720; I.V.:476.7] Out: 2765 [Urine:1150; Drains:665] Intake/Output this shift:    Incision/Wound:no redness but small scanty mucus drainage.  No foul smelling.  T tube draining bile JP clear  Lab Results:   Basename 12/31/11 0356 12/29/11 0021  WBC 8.6 7.5  HGB 8.4* 8.8*  HCT 25.7* 27.6*  PLT 471* 384   BMET  Basename 12/31/11 0356 12/29/11 0021  NA 136 135  K 3.4* 3.6  CL 101 100  CO2 27 28  GLUCOSE 147* 182*  BUN 8 11  CREATININE 1.16 1.13  CALCIUM 8.1* 7.8*   PT/INR No results found for this basename: LABPROT:2,INR:2 in the last 72 hours ABG No results found for this basename: PHART:2,PCO2:2,PO2:2,HCO3:2 in the last 72 hours  Studies/Results: No results found.  Anti-infectives: Anti-infectives     Start     Dose/Rate Route Frequency Ordered Stop   12/24/11 1523   vancomycin (VANCOCIN) 1,500 mg in sodium chloride 0.9 % 500 mL IVPB  Status:  Discontinued        1,500 mg 250 mL/hr over 120 Minutes Intravenous 120 min pre-op 12/24/11 1523 12/24/11 1918   12/23/11 1200   cefOXitin (MEFOXIN) 1 g in dextrose 5 % 50 mL IVPB        1 g 100 mL/hr over 30 Minutes Intravenous Every 8 hours 12/23/11 1037     12/22/11 0900   ciprofloxacin (CIPRO) IVPB 400 mg  Status:  Discontinued        400 mg 200 mL/hr over 60 Minutes Intravenous Every 12 hours 12/22/11 0800 12/23/11 0732   12/20/11 0630   ciprofloxacin (CIPRO) IVPB 400 mg        400 mg 200 mL/hr over 60 Minutes Intravenous 120 min pre-op 12/20/11 0617 12/20/11 0720           Assessment/Plan: s/p Procedure(s) (LRB):  CBD exploration and cholecystectomy with return to OR for T tube dislodgement  Day 13/8 EXPLORATORY LAPAROTOMY (N/A) INTRAOPERATIVE CHOLANGIOGRAM (N/A) DM II  Stable  CRF  Stable Home health set up.   PT/OT Maybe home over the weekend but very weak.  Leave T tube to drain and leave JP in place.  Will follow wound closely but no redness noted. HHN set up   LOS: 11 days    Frank Manning A. 12/31/2011

## 2011-12-31 NOTE — Progress Notes (Signed)
Physical Therapy Treatment Patient Details Name: Frank Ashley MRN: [id number] DOB: 1941/04/11 Today's Date: 12/31/2011 Time: 1450-1510 PT Time Calculation (min): 20 min  PT Assessment / Plan / Recommendation Comments on Treatment Session  Pt improved today in gait, but still with limited endurance.  Began discussions about car transfers and stair negotiation. Will practice steps tomorrow.    Follow Up Recommendations       Barriers to Discharge        Equipment Recommendations  Rolling walker with 5" wheels;3 in 1 bedside comode    Recommendations for Other Services    Frequency     Plan Discharge plan remains appropriate    Precautions / Restrictions Precautions Precautions: Fall Precaution Comments: 2 abdominal drains Restrictions Weight Bearing Restrictions: No   Pertinent Vitals/Pain Pt continues to c/o pain in right back/side    Mobility  Bed Mobility Sit to Supine: 4: Min guard;HOB elevated Transfers Sit to Stand: 5: Supervision;With upper extremity assist Stand to Sit: 5: Supervision;Without upper extremity assist Details for Transfer Assistance: repeated sit to stand 5 times for strengthening Ambulation/Gait Ambulation/Gait Assistance: 4: Min guard Ambulation Distance (Feet): 200 Feet Assistive device: Rolling walker Ambulation/Gait Assistance Details: pt up in chair with OT upon my arrival.  Pt able to participate in exercise and walking Gait Pattern: Step-through pattern;Decreased stride length;Trunk flexed General Gait Details: improved distance.  Pt still keeps trunk flexed with hips behind knees and feet.  difficulty standing erect Stairs: No Wheelchair Mobility Wheelchair Mobility: No    Exercises Other Exercises Other Exercises: bilateral UE hip to hip and knee lifts in sitting to activate core muscles   PT Diagnosis:    PT Problem List:   PT Treatment Interventions:     PT Goals Acute Rehab PT Goals PT Goal: Sit to Stand - Progress:  Progressing toward goal PT Goal: Stand to Sit - Progress: Progressing toward goal PT Goal: Ambulate - Progress: Progressing toward goal  Visit Information  Last PT Received On: 12/31/11 Assistance Needed: +1    Subjective Data  Subjective: How am I going to get out of the car Patient Stated Goal: to go home on Sunday   Cognition  Overall Cognitive Status: Appears within functional limits for tasks assessed/performed Arousal/Alertness: Awake/alert Orientation Level: Appears intact for tasks assessed Behavior During Session: [facility] for tasks performed    Balance  Balance Balance Assessed: Yes Static Standing Balance Static Standing - Balance Support: No upper extremity supported;Bilateral upper extremity supported;Right upper extremity supported;Left upper extremity supported Static Standing - Level of Assistance: 6: Modified independent (Device/Increase time) Static Standing - Comment/# of Minutes: 1  pt attempted to decrease the amount of UE support and bring hips under him for standing balance.  Fatigued after one minute  End of Session PT - End of Session Activity Tolerance: Patient tolerated treatment well Patient left: in chair Nurse Communication: Mobility status   GP     Bayard Hugger. Manson Passey, PT 12/31/2011, 3:57 PM

## 2011-12-31 NOTE — Evaluation (Signed)
Occupational Therapy Evaluation Patient Details Name: Frank Ashley MRN: [id number] DOB: 1940-05-08 Today's Date: 12/31/2011 Time: 8657-8469 OT Time Calculation (min): 13 min  OT Assessment / Plan / Recommendation Clinical Impression  This 71 year old man underwent an exploratory lap is s/p cholecystectomy and renal mass excision.  He required second surgery due to T-drain dislodgement and has 2 drains. He has a h/o DM.  He is appropriate for skilled OT in acute to continue education and ensure safe transfers in the bathroom.    OT Assessment  Patient needs continued OT Services    Follow Up Recommendations  HHOT    Barriers to Discharge      Equipment Recommendations  Rolling walker with 5" wheels;3 in 1 bedside comode    Recommendations for Other Services    Frequency  Min 2X/week    Precautions / Restrictions Precautions Precautions: Fall Precaution Comments: 2 abdominal drains Restrictions Weight Bearing Restrictions: No   Pertinent Vitals/Pain Abdomen sore    ADL  Eating/Feeding: Simulated;Independent Where Assessed - Eating/Feeding: Chair Grooming: Simulated;Supervision/safety Where Assessed - Grooming: Supported standing Upper Body Bathing: Simulated;Set up Where Assessed - Upper Body Bathing: Supported sitting Lower Body Bathing: Simulated;Moderate assistance Where Assessed - Lower Body Bathing: Supported sit to stand Upper Body Dressing: Performed;Minimal assistance (tie gown) Where Assessed - Upper Body Dressing: Supported standing Lower Body Dressing: Simulated;Maximal assistance (currently unable to reach feet:  drains and swelling) Where Assessed - Lower Body Dressing: Supported sit to stand Toilet Transfer: Buyer, retail Method: Sit to stand (bed to bathroom to high recliner) Toileting - Clothing Manipulation and Hygiene: Performed;Moderate assistance Tub/Shower Transfer:  (educated on sequence) Equipment Used: Rolling  walker Transfers/Ambulation Related to ADLs: ambulated to bathroom with RW ADL Comments: Performed LB hygiene after urinal spilled (in standing)    OT Diagnosis: Generalized weakness  OT Problem List: Decreased strength;Decreased activity tolerance;Pain;Decreased knowledge of use of DME or AE OT Treatment Interventions: Self-care/ADL training;Therapeutic activities;Patient/family education;DME and/or AE instruction   OT Goals Acute Rehab OT Goals OT Goal Formulation: With patient Time For Goal Achievement: 01/14/12 Potential to Achieve Goals: Good ADL Goals Pt Will Transfer to Toilet: with modified independence;Ambulation;3-in-1 ADL Goal: Toilet Transfer - Progress: Goal set today Pt Will Perform Toileting - Hygiene: with modified independence;Sit to stand from 3-in-1/toilet ADL Goal: Toileting - Hygiene - Progress: Goal set today Pt Will Perform Tub/Shower Transfer: Shower transfer;with supervision;Ambulation ADL Goal: Tub/Shower Transfer - Progress: Goal set today Arm Goals Additional Arm Goal #1: pt will be independent with UE HEP (below 90 degrees until drains removed) Arm Goal: Additional Goal #1 - Progress: Goal set today Miscellaneous OT Goals Miscellaneous OT Goal #1: Pt will verbalize use of AE for adls OT Goal: Miscellaneous Goal #1 - Progress: Goal set today  Visit Information  Last OT Received On: 12/31/11 Assistance Needed: +1    Subjective Data  Subjective: "Amber told me she'd come help me get cleaned up (urinal spilled)" Patient Stated Goal: agreeable to OT   Prior Functioning  Vision/Perception  Home Living Lives With: Spouse Bathroom Shower/Tub: Health visitor: Standard Prior Function Level of Independence: Independent Communication Communication: No difficulties Dominant Hand: Right      Cognition  Overall Cognitive Status: Appears within functional limits for tasks assessed/performed Arousal/Alertness: Awake/alert Orientation  Level: Appears intact for tasks assessed Behavior During Session: [facility] for tasks performed    Extremity/Trunk Assessment Right Upper Extremity Assessment RUE ROM/Strength/Tone: [facility] for tasks assessed. Kept arms below 90 due to  drains.  Pt has good strength but feels he has lost strength Left Upper Extremity Assessment LUE ROM/Strength/Tone: WFL for tasks assessed   Mobility  Shoulder Instructions  Bed Mobility Sit to Supine: 4: Min guard;HOB elevated Transfers Sit to Stand: 5: Supervision;With upper extremity assist Stand to Sit: 5: Supervision;Without upper extremity assist       Exercise    Balance    End of Session OT - End of Session Activity Tolerance: Patient tolerated treatment well Patient left: in chair;with call bell/phone within reach;with family/visitor present  GO     Suleiman Finigan 12/31/2011, 3:59 PM Marica Otter, OTR/L [phone] 12/31/2011

## 2012-01-01 DIAGNOSIS — I10 Essential (primary) hypertension: Secondary | ICD-10-CM

## 2012-01-01 DIAGNOSIS — T85520A Displacement of bile duct prosthesis, initial encounter: Secondary | ICD-10-CM | POA: Clinically undetermined

## 2012-01-01 LAB — GLUCOSE, CAPILLARY
Glucose-Capillary: 137 mg/dL — ABNORMAL HIGH (ref 70–99)
Glucose-Capillary: 144 mg/dL — ABNORMAL HIGH (ref 70–99)
Glucose-Capillary: 164 mg/dL — ABNORMAL HIGH (ref 70–99)

## 2012-01-01 MED ORDER — ALPRAZOLAM 0.5 MG PO TABS
0.5000 mg | ORAL_TABLET | Freq: Three times a day (TID) | ORAL | Status: DC | PRN
  Administered 2012-01-01 – 2012-01-05 (×13): 0.5 mg via ORAL
  Filled 2012-01-01 (×15): qty 1

## 2012-01-01 MED ORDER — GABAPENTIN 800 MG PO TABS: 800.0000 mg | ORAL_TABLET | Freq: Every day | ORAL | Status: DC

## 2012-01-01 MED ORDER — TRIAMCINOLONE ACETONIDE 0.1 % EX CREA
1.0000 "application " | TOPICAL_CREAM | Freq: Two times a day (BID) | CUTANEOUS | Status: DC | PRN
  Filled 2012-01-01: qty 15

## 2012-01-01 MED ORDER — INSULIN ASPART 100 UNIT/ML ~~LOC~~ SOLN
0.0000 [IU] | Freq: Two times a day (BID) | SUBCUTANEOUS | Status: DC
  Administered 2012-01-02: 5 [IU] via SUBCUTANEOUS
  Administered 2012-01-03: 11 [IU] via SUBCUTANEOUS
  Administered 2012-01-03: 8 [IU] via SUBCUTANEOUS
  Administered 2012-01-04: 2 [IU] via SUBCUTANEOUS
  Administered 2012-01-04: 8 [IU] via SUBCUTANEOUS
  Administered 2012-01-05: 2 [IU] via SUBCUTANEOUS

## 2012-01-01 MED ORDER — OXICONAZOLE NITRATE 1 % EX CREA: 1.0000 "application " | TOPICAL_CREAM | CUTANEOUS | Status: DC | PRN

## 2012-01-01 MED ORDER — POLYETHYLENE GLYCOL 3350 17 G PO PACK
17.0000 g | PACK | Freq: Two times a day (BID) | ORAL | Status: DC
  Administered 2012-01-02: 17 g via ORAL
  Filled 2012-01-01 (×9): qty 1

## 2012-01-01 MED ORDER — HYDROMORPHONE HCL 4 MG PO TABS
4.0000 mg | ORAL_TABLET | ORAL | Status: DC | PRN
  Administered 2012-01-01 – 2012-01-04 (×19): 8 mg via ORAL
  Administered 2012-01-05 (×2): 4 mg via ORAL
  Administered 2012-01-05: 8 mg via ORAL
  Filled 2012-01-01 (×19): qty 2
  Filled 2012-01-01: qty 1
  Filled 2012-01-01 (×2): qty 2
  Filled 2012-01-01: qty 1

## 2012-01-01 MED ORDER — ALPRAZOLAM 0.5 MG PO TABS: 0.5000 mg | ORAL_TABLET | Freq: Three times a day (TID) | ORAL | Status: DC

## 2012-01-01 MED ORDER — FENOFIBRATE 160 MG PO TABS
160.0000 mg | ORAL_TABLET | Freq: Every evening | ORAL | Status: DC
  Administered 2012-01-01 – 2012-01-04 (×4): 160 mg via ORAL
  Filled 2012-01-01 (×5): qty 1

## 2012-01-01 MED ORDER — HYDROMORPHONE HCL PF 1 MG/ML IJ SOLN: 1.0000 mg | INTRAMUSCULAR | Status: DC | PRN

## 2012-01-01 MED ORDER — SIMVASTATIN 40 MG PO TABS
40.0000 mg | ORAL_TABLET | Freq: Every day | ORAL | Status: DC
  Administered 2012-01-01 – 2012-01-04 (×4): 40 mg via ORAL
  Filled 2012-01-01 (×5): qty 1

## 2012-01-01 MED ORDER — PANTOPRAZOLE SODIUM 40 MG PO TBEC
40.0000 mg | DELAYED_RELEASE_TABLET | Freq: Two times a day (BID) | ORAL | Status: DC
  Administered 2012-01-01 – 2012-01-05 (×8): 40 mg via ORAL
  Filled 2012-01-01 (×11): qty 1

## 2012-01-01 MED ORDER — OXICONAZOLE NITRATE 1 % EX CREA
1.0000 "application " | TOPICAL_CREAM | CUTANEOUS | Status: DC | PRN
  Filled 2012-01-01: qty 1

## 2012-01-01 MED ORDER — OXICONAZOLE NITRATE 1 % EX CREA: TOPICAL_CREAM | CUTANEOUS | Status: DC | PRN

## 2012-01-01 MED ORDER — GABAPENTIN 600 MG PO TABS
600.0000 mg | ORAL_TABLET | Freq: Three times a day (TID) | ORAL | Status: DC
  Administered 2012-01-01 (×3): 600 mg via ORAL
  Filled 2012-01-01 (×8): qty 1

## 2012-01-01 MED ORDER — ACETAMINOPHEN 500 MG PO TABS
1000.0000 mg | ORAL_TABLET | Freq: Three times a day (TID) | ORAL | Status: DC
  Administered 2012-01-01 – 2012-01-05 (×13): 1000 mg via ORAL
  Filled 2012-01-01 (×15): qty 2

## 2012-01-01 NOTE — Progress Notes (Signed)
Physical Therapy Treatment Patient Details Name: Frank Ashley MRN: [id number] DOB: 1940/08/17 Today's Date: 01/01/2012 Time: 0930-0940 PT Time Calculation (min): 10 min  PT Assessment / Plan / Recommendation Comments on Treatment Session  pt limited by pain today,  Not able to advance gait or do steps at this time until pain is better under control    Follow Up Recommendations       Barriers to Discharge        Equipment Recommendations       Recommendations for Other Services    Frequency     Plan Discharge plan remains appropriate    Precautions / Restrictions Restrictions Weight Bearing Restrictions: No   Pertinent Vitals/Pain Pt c/o pain below his shoulder blade that is limiting exercise progression    Mobility  Bed Mobility Sit to Supine: 6: Modified independent (Device/Increase time);With rail Details for Bed Mobility Assistance: still using rail but able to lift legs up onto bed on his own Transfers Transfers: Sit to Stand;Stand to Sit Sit to Stand: 5: Supervision;With upper extremity assist Stand to Sit: 5: Supervision;With upper extremity assist Ambulation/Gait Ambulation/Gait Assistance: 5: Supervision Ambulation Distance (Feet): 3 Feet Assistive device: Rolling walker Ambulation/Gait Assistance Details: pt has his own RW delivered identification sticker applied Gait Pattern: Step-through pattern General Gait Details: pt only able to step from chair to get back to bed. Attempted to stand at EOB for standing endurance, but pt not able to tolerate due to pain Stairs: No    Exercises     PT Diagnosis:    PT Problem List:   PT Treatment Interventions:     PT Goals Acute Rehab PT Goals PT Goal: Sit to Supine/Side - Progress: Progressing toward goal PT Goal: Sit to Stand - Progress: Progressing toward goal PT Goal: Stand to Sit - Progress: Progressing toward goal  Visit Information  Last PT Received On: 01/01/12 Assistance Needed: +1    Subjective  Data  Subjective: I had a rough night,  Its this pain Patient Stated Goal: to get better pain relief   Cognition  Overall Cognitive Status: Appears within functional limits for tasks assessed/performed Arousal/Alertness: Awake/alert Orientation Level: Appears intact for tasks assessed Behavior During Session: [facility] for tasks performed    Balance     End of Session PT - End of Session Activity Tolerance: Patient limited by pain Patient left: in bed;Other (comment) (pt gets some relief from folded pillows under knees)   GP     Bayard Hugger. Manson Passey, [place] [phone] 01/01/2012, 9:58 AM

## 2012-01-01 NOTE — Progress Notes (Signed)
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  Today Frank Ashley is doing well. He does have persistent drain output from surgical drain and some persistant flank pain that partially responds to oral dilaudid. No acute events. Working with PT.  Objective: Vital signs in last 24 hours: Temp:  [98.2 F (36.8 C)-98.8 F (37.1 C)] 98.6 F (37 C) (08/31 0631) Pulse Rate:  [80-88] 80  (08/31 0631) Resp:  [18] 18  (08/31 0631) BP: (121-164)/(69-82) 150/82 mmHg (08/31 0631) SpO2:  [96 %-98 %] 96 % (08/31 0631) Last BM Date: 12/31/11  Intake/Output from previous day: 08/30 0701 - 08/31 0700 In: 80 [P.O.:80] Out: 1270 [Urine:725; Drains:545] Intake/Output this shift:    General appearance: alert, cooperative and appears stated age Head: Normocephalic, without obvious abnormality, atraumatic Throat: lips, mucosa, and tongue normal; teeth and gums normal Back: symmetric, no curvature. ROM normal. No CVA tenderness. Resp: clear to auscultation bilaterally Cardio: regular rate and rhythm, S1, S2 normal, no murmur, click, rub or gallop GI: soft, non-tender; bowel sounds normal; no masses,  no organomegaly Extremities: extremities normal, atraumatic, no cyanosis or edema Pulses: 2+ and symmetric Incision/Wound: Most recent surgical incision c/d/i with staples. Biliary tube with biliious output, JP with serous output.  Lab Results:   [facility] 12/31/11 0356  WBC 8.6  HGB 8.4*  HCT 25.7*  PLT 471*   BMET  Basename 12/31/11 0356  NA 136  K 3.4*  CL 101  CO2 27  GLUCOSE 147*  BUN 8  CREATININE 1.16  CALCIUM 8.1*   PT/INR No results found for this basename: LABPROT:2,INR:2 in the last 72 hours ABG No results found for this basename: PHART:2,PCO2:2,PO2:2,HCO3:2 in the last 72 hours  Studies/Results: No results found.  Anti-infectives: Anti-infectives     Start     Dose/Rate  Route Frequency Ordered Stop   12/31/11 1300   amoxicillin-clavulanate (AUGMENTIN) 875-125 MG per tablet 1 tablet        1 tablet Oral Every 12 hours 12/31/11 1157     12/31/11 0000   amoxicillin-clavulanate (AUGMENTIN) 875-125 MG per tablet        1 tablet Oral Every 12 hours 12/31/11 1222 01/10/12 2359   12/24/11 1523   vancomycin (VANCOCIN) 1,500 mg in sodium chloride 0.9 % 500 mL IVPB  Status:  Discontinued        1,500 mg 250 mL/hr over 120 Minutes Intravenous 120 min pre-op 12/24/11 1523 12/24/11 1918   12/23/11 1200   cefOXitin (MEFOXIN) 1 g in dextrose 5 % 50 mL IVPB  Status:  Discontinued        1 g 100 mL/hr over 30 Minutes Intravenous Every 8 hours 12/23/11 1037 12/31/11 1157   12/22/11 0900   ciprofloxacin (CIPRO) IVPB 400 mg  Status:  Discontinued        400 mg 200 mL/hr over 60 Minutes Intravenous Every 12 hours 12/22/11 0800 12/23/11 0732   12/20/11 0630   ciprofloxacin (CIPRO) IVPB 400 mg        400 mg 200 mL/hr over 60 Minutes Intravenous 120 min pre-op 12/20/11 0617 12/20/11 0720          Assessment/Plan: 1 - Renal Cell Carcinoma - will check drain Cr today to verify not urine. Otherwise no GU specific changes.  May consider NSAID or tylenol adjunctives if not contraindicated. If pain truly worsening, may consider repeat axial imaging.  LOS: 12 days    Frank Ashley 01/01/2012

## 2012-01-01 NOTE — Progress Notes (Signed)
Physical Therapy Treatment Patient Details Name: Frank Ashley MRN: [id number] DOB: 1941-02-16 Today's Date: 01/01/2012 Time: 4098-1191 PT Time Calculation (min): 8 min  PT Assessment / Plan / Recommendation Comments on Treatment Session  Much improved gait tolerance this afternoon!  Pt alble to walk 400'  without assistive  device. Pain is controlled. Will practice steps in AM    Follow Up Recommendations  Home health PT    Barriers to Discharge        Equipment Recommendations       Recommendations for Other Services    Frequency     Plan      Precautions / Restrictions Precautions Precautions: Fall Precaution Comments: 2 abdominal drains Restrictions Weight Bearing Restrictions: No   Pertinent Vitals/Pain Pain controlled this afternoon    Mobility  Transfers Transfers: Sit to Stand;Stand to Sit Sit to Stand: 6: Modified independent (Device/Increase time) Stand to Sit: 6: Modified independent (Device/Increase time) Ambulation/Gait Ambulation/Gait Assistance: 5: Supervision Ambulation Distance (Feet): 400 Feet Assistive device: None Ambulation/Gait Assistance Details: encourgaed gentle reiprocol arm swing for balance Gait velocity: decreased General Gait Details: pt able to walk in center of hall without balance loss Stairs: No Wheelchair Mobility Wheelchair Mobility: No    Exercises     PT Diagnosis:    PT Problem List:   PT Treatment Interventions:     PT Goals Acute Rehab PT Goals PT Goal: Sit to Stand - Progress: Met PT Goal: Stand to Sit - Progress: Met PT Goal: Ambulate - Progress: Met  Visit Information  Last PT Received On: 01/01/12    Subjective Data  Subjective: "I want to try without the walker" Patient Stated Goal: to walk   Cognition  Overall Cognitive Status: Appears within functional limits for tasks assessed/performed Arousal/Alertness: Awake/alert Orientation Level: Appears intact for tasks assessed Behavior During Session: [facility]  for tasks performed    Balance  Static Standing Balance Static Standing - Balance Support: No upper extremity supported Static Standing - Level of Assistance: 5: Stand by assistance Static Standing - Comment/# of Minutes: 3  End of Session PT - End of Session Activity Tolerance: Patient tolerated treatment well Patient left: with family/visitor present;with nursing in room (sitting unsupported on edge of bed) Nurse Communication: Mobility status   GP     Donnetta Hail 01/01/2012, 3:08 PM

## 2012-01-01 NOTE — Progress Notes (Signed)
Frank Ashley [id number] 24-Dec-1940   Subjective:  Sore - meds not lasting well No n/v Eating OK PT here to see  Objective:  Vital signs:  Filed Vitals:   12/31/11 0600 12/31/11 1400 12/31/11 2111 01/01/12 0631  BP: 152/55 164/71 121/69 150/82  Pulse: 90 88 83 80  Temp: 99.1 F (37.3 C) 98.2 F (36.8 C) 98.8 F (37.1 C) 98.6 F (37 C)  TempSrc: Oral Oral Oral Oral  Resp: 20 18 18 18   Height:      Weight:      SpO2: 98% 96% 98% 96%    Last BM Date: 12/31/11  Intake/Output   Yesterday:  08/30 0701 - 08/31 0700 In: 80 [P.O.:80] Out: 1270 [Urine:725; Drains:545] This shift:     Bowel function:  Flatus: y  BM: y  Bulb drain scant serosang  Biliary bag with clear bile   Physical Exam:  General: Pt awake/alert/oriented x4 in no acute distress but tired Eyes: PERRL, normal EOM.  Sclera clear.  No icterus Neuro: CN II-XII intact w/o focal sensory/motor deficits. Lymph: No head/neck/groin lymphadenopathy Psych:  No delerium/psychosis/paranoia HENT: Normocephalic, Mucus membranes moist.  No thrush Neck: Supple, No tracheal deviation Chest: No chest wall pain w good excursion CV:  Pulses intact.  Regular rhythm Abdomen: Soft.  Nondistended.  Mildly tender at R flank incision only.  No incarcerated hernias. Ext:  SCDs BLE.  No mjr edema.  No cyanosis Skin: No petechiae / purpurae  Problem List:  Principal Problem:  *Biliary drain displacement - T-tube replaced Active Problems:  MRSA  HYPERLIPIDEMIA  Overweight  PERIPHERAL NEUROPATHY  GERD  RENAL INSUFFICIENCY, CHRONIC  SLEEP APNEA  Ascending cholangitis  Right renal mass  Hypertension  Diabetes mellitus type 2, insulin dependent  Hyperlipidemia   Assessment  Frank Ashley  71 y.o. male  8 Days Post-Op  Procedure(s): EXPLORATORY LAPAROTOMY INTRAOPERATIVE CHOLANGIOGRAM  Fair pain control but w chronic pain  Plan:  -resume home pain regimen -add tylenol (no NSAIDs w Ashley nephrectomy &  recent ATN/ARF just now better) -inc Dilaudid -VTE prophylaxis- SCDs, etc -mobilize as tolerated to help recovery -ABx -HTN controlled  Ardeth Sportsman, M.D., F.A.C.S. Gastrointestinal and Minimally Invasive Surgery [facility] [address], [address] [place], [place] [address] [phone] Main / Paging [phone] Voice Mail   01/01/2012  CARE TEAM:  PCP: Kristian Covey, MD  Outpatient Care Team: Patient Care Team: Kristian Covey, MD as PCP - General Clovis Pu. Cornett, MD (General Surgery) Antony Haste, MD as Attending Physician (Urology)  Inpatient Treatment Team: Treatment Team: Attending Provider: Clovis Pu. Cornett, MD; Surgeon: Antony Haste, MD; Registered Nurse: Bennetta Laos, RN; Registered Nurse: Skipper Cliche, RN; Technician: Lynden Ang, NT; Registered Nurse: Laray Anger, RN; Registered Nurse: Mckinley Jewel, RN; Technician: Melvenia Beam, NT; Physical Therapist: Donnetta Hail, PT   Results:   Labs: Results for orders placed during the hospital encounter of 12/20/11 (from the past 48 hour(s))  GLUCOSE, CAPILLARY     Status: Abnormal   Collection Time   12/30/11 11:53 AM      Component Value Range Comment   Glucose-Capillary 151 (*) 70 - 99 mg/dL    Comment 1 Notify RN      Comment 2 Documented in Chart     GLUCOSE, CAPILLARY     Status: Abnormal   Collection Time   12/30/11  4:11 PM      Component Value Range  Comment   Glucose-Capillary 220 (*) 70 - 99 mg/dL    Comment 1 Notify RN      Comment 2 Documented in Chart     GLUCOSE, CAPILLARY     Status: Abnormal   Collection Time   12/30/11  8:05 PM      Component Value Range Comment   Glucose-Capillary 136 (*) 70 - 99 mg/dL    Comment 1 Notify RN     GLUCOSE, CAPILLARY     Status: Abnormal   Collection Time   12/30/11 11:46 PM      Component Value Range Comment   Glucose-Capillary 111 (*) 70 - 99 mg/dL   GLUCOSE,  CAPILLARY     Status: Abnormal   Collection Time   12/31/11  3:39 AM      Component Value Range Comment   Glucose-Capillary 147 (*) 70 - 99 mg/dL    Comment 1 Notify RN     CBC     Status: Abnormal   Collection Time   12/31/11  3:56 AM      Component Value Range Comment   WBC 8.6  4.0 - 10.5 K/uL    RBC 3.37 (*) 4.22 - 5.81 MIL/uL    Hemoglobin 8.4 (*) 13.0 - 17.0 g/dL    HCT 40.9 (*) 81.1 - 52.0 %    MCV 76.3 (*) 78.0 - 100.0 fL    MCH 24.9 (*) 26.0 - 34.0 pg    MCHC 32.7  30.0 - 36.0 g/dL    RDW 91.4 (*) 78.2 - 15.5 %    Platelets 471 (*) 150 - 400 K/uL   COMPREHENSIVE METABOLIC PANEL     Status: Abnormal   Collection Time   12/31/11  3:56 AM      Component Value Range Comment   Sodium 136  135 - 145 mEq/L    Potassium 3.4 (*) 3.5 - 5.1 mEq/L    Chloride 101  96 - 112 mEq/L    CO2 27  19 - 32 mEq/L    Glucose, Bld 147 (*) 70 - 99 mg/dL    BUN 8  6 - 23 mg/dL    Creatinine, Ser 9.56  0.50 - 1.35 mg/dL    Calcium 8.1 (*) 8.4 - 10.5 mg/dL    Total Protein 5.2 (*) 6.0 - 8.3 g/dL    Albumin 2.2 (*) 3.5 - 5.2 g/dL    AST 18  0 - 37 U/L    ALT 16  0 - 53 U/L    Alkaline Phosphatase 86  39 - 117 U/L    Total Bilirubin 0.6  0.3 - 1.2 mg/dL    GFR calc non Af Amer 62 (*) >90 mL/min    GFR calc Af Amer 72 (*) >90 mL/min   GLUCOSE, CAPILLARY     Status: Abnormal   Collection Time   12/31/11  7:55 AM      Component Value Range Comment   Glucose-Capillary 202 (*) 70 - 99 mg/dL   GLUCOSE, CAPILLARY     Status: Abnormal   Collection Time   12/31/11  1:59 PM      Component Value Range Comment   Glucose-Capillary 118 (*) 70 - 99 mg/dL   GLUCOSE, CAPILLARY     Status: Abnormal   Collection Time   12/31/11  4:51 PM      Component Value Range Comment   Glucose-Capillary 144 (*) 70 - 99 mg/dL   GLUCOSE, CAPILLARY     Status: Abnormal  Collection Time   12/31/11  8:05 PM      Component Value Range Comment   Glucose-Capillary 115 (*) 70 - 99 mg/dL   URINALYSIS, ROUTINE W REFLEX  MICROSCOPIC     Status: Normal   Collection Time   12/31/11  8:15 PM      Component Value Range Comment   Color, Urine YELLOW  YELLOW    APPearance CLEAR  CLEAR    Specific Gravity, Urine 1.009  1.005 - 1.030    pH 7.0  5.0 - 8.0    Glucose, UA NEGATIVE  NEGATIVE mg/dL    Hgb urine dipstick NEGATIVE  NEGATIVE    Bilirubin Urine NEGATIVE  NEGATIVE    Ketones, ur NEGATIVE  NEGATIVE mg/dL    Protein, ur NEGATIVE  NEGATIVE mg/dL    Urobilinogen, UA 0.2  0.0 - 1.0 mg/dL    Nitrite NEGATIVE  NEGATIVE    Leukocytes, UA NEGATIVE  NEGATIVE MICROSCOPIC NOT DONE ON URINES WITH NEGATIVE PROTEIN, BLOOD, LEUKOCYTES, NITRITE, OR GLUCOSE <1000 mg/dL.  GLUCOSE, CAPILLARY     Status: Normal   Collection Time   12/31/11 11:35 PM      Component Value Range Comment   Glucose-Capillary 87  70 - 99 mg/dL   GLUCOSE, CAPILLARY     Status: Abnormal   Collection Time   01/01/12  4:10 AM      Component Value Range Comment   Glucose-Capillary 137 (*) 70 - 99 mg/dL   GLUCOSE, CAPILLARY     Status: Abnormal   Collection Time   01/01/12  8:13 AM      Component Value Range Comment   Glucose-Capillary 150 (*) 70 - 99 mg/dL     Imaging / Studies: No results found.  Medications / Allergies: per chart  Antibiotics: Anti-infectives     Start     Dose/Rate Route Frequency Ordered Stop   12/31/11 1300   amoxicillin-clavulanate (AUGMENTIN) 875-125 MG per tablet 1 tablet        1 tablet Oral Every 12 hours 12/31/11 1157     12/31/11 0000   amoxicillin-clavulanate (AUGMENTIN) 875-125 MG per tablet        1 tablet Oral Every 12 hours 12/31/11 1222 01/10/12 2359   12/24/11 1523   vancomycin (VANCOCIN) 1,500 mg in sodium chloride 0.9 % 500 mL IVPB  Status:  Discontinued        1,500 mg 250 mL/hr over 120 Minutes Intravenous 120 min pre-op 12/24/11 1523 12/24/11 1918   12/23/11 1200   cefOXitin (MEFOXIN) 1 g in dextrose 5 % 50 mL IVPB  Status:  Discontinued        1 g 100 mL/hr over 30 Minutes Intravenous Every 8  hours 12/23/11 1037 12/31/11 1157   12/22/11 0900   ciprofloxacin (CIPRO) IVPB 400 mg  Status:  Discontinued        400 mg 200 mL/hr over 60 Minutes Intravenous Every 12 hours 12/22/11 0800 12/23/11 0732   12/20/11 0630   ciprofloxacin (CIPRO) IVPB 400 mg        400 mg 200 mL/hr over 60 Minutes Intravenous 120 min pre-op 12/20/11 0617 12/20/11 0720

## 2012-01-02 LAB — GLUCOSE, CAPILLARY
Glucose-Capillary: 242 mg/dL — ABNORMAL HIGH (ref 70–99)
Glucose-Capillary: 58 mg/dL — ABNORMAL LOW (ref 70–99)
Glucose-Capillary: 185 mg/dL — ABNORMAL HIGH (ref 70–99)

## 2012-01-02 MED ORDER — GABAPENTIN 300 MG PO CAPS
600.0000 mg | ORAL_CAPSULE | Freq: Three times a day (TID) | ORAL | Status: DC
  Administered 2012-01-02 – 2012-01-05 (×13): 600 mg via ORAL
  Filled 2012-01-02 (×17): qty 2

## 2012-01-02 NOTE — Progress Notes (Signed)
PT has refused CPAP for the night. PT states that the nasal  mask gave him a terrible headache last night after having it on for only 30-40 minutes. PT tried again to wear mask tonight (with straps not so tight), but could only tolerate for less than 5 min. PT did not even want to try full face mask.PT in no distress at this time and RT will continue to monitor.

## 2012-01-02 NOTE — Progress Notes (Signed)
Physical Therapy Treatment Patient Details Name: Frank Ashley MRN: [id number] DOB: 20-Oct-1940 Today's Date: 01/02/2012 Time: 4098-1191 PT Time Calculation (min): 40 min  PT Assessment / Plan / Recommendation Comments on Treatment Session  continued progress but more unsteady today than yesterday afternoon; May benefit from use of [facility], discussed with pt and he agrees    Follow Up Recommendations  Home health PT    Barriers to Discharge        Equipment Recommendations  Other (comment) (??son is making him a cane; if not may need SPC)    Recommendations for Other Services    Frequency Min 3X/week   Plan Discharge plan remains appropriate    Precautions / Restrictions Precautions Precautions: Fall Precaution Comments: 2 abdominal drains   Pertinent Vitals/Pain     Mobility  Bed Mobility Bed Mobility: Sit to Supine Rolling Right: 5: Supervision;With rail Right Sidelying to Sit: 5: Supervision;With rails;HOB elevated Supine to Sit: 5: Supervision;HOB elevated Sitting - Scoot to Edge of Bed: 5: Supervision Transfers Transfers: Sit to Stand;Stand to Sit Sit to Stand: 5: Supervision;From bed;Other (comment) (bench seat in waiting area) Stand to Sit: 6: Modified independent (Device/Increase time);Other (comment);To bed Details for Transfer Assistance: supervision for safety, pt unsteady upon initial stand Ambulation/Gait Ambulation/Gait Assistance: 5: Supervision;4: Min guard Ambulation Distance (Feet): 250 Feet (times 2) Assistive device: None Ambulation/Gait Assistance Details: pt unsteady without use of AD today, requiring intermittent HHA and using the rail in hallway Gait Pattern: Step-through pattern Gait velocity: decreased General Gait Details: pt with LOB x 3, min/guard to recove, reportts meds may be making him "woozy"; required 5 minute rest between ambdistance/prior to stair training Stairs: Yes Stairs Assistance: 4: Min guard Stair Management Technique: One rail  Right;Sideways;Forwards Number of Stairs: 2  (x3)    Exercises     PT Diagnosis:    PT Problem List:   PT Treatment Interventions:     PT Goals Acute Rehab PT Goals Time For Goal Achievement: 01/05/12 Potential to Achieve Goals: Good Pt will go Supine/Side to Sit: Independently PT Goal: Supine/Side to Sit - Progress: Progressing toward goal Pt will go Sit to Supine/Side: Independently PT Goal: Sit to Supine/Side - Progress: Progressing toward goal Pt will go Sit to Stand: with supervision PT Goal: Sit to Stand - Progress: Met Pt will go Stand to Sit: with supervision PT Goal: Stand to Sit - Progress: Met Pt will Ambulate: 51 - 150 feet;with least restrictive assistive device;with modified independence PT Goal: Ambulate - Progress: Updated due to goal met Pt will Go Up / Down Stairs: 3-5 stairs;with supervision;with least restrictive assistive device PT Goal: Up/Down Stairs - Progress: Progressing toward goal  Visit Information  Last PT Received On: 01/02/12 Assistance Needed: +1    Subjective Data  Subjective: i have been walking without anything Patient Stated Goal: home   Cognition  Overall Cognitive Status: Appears within functional limits for tasks assessed/performed Arousal/Alertness: Awake/alert Orientation Level: Appears intact for tasks assessed Behavior During Session: [facility] for tasks performed    Balance     End of Session PT - End of Session Activity Tolerance: Patient tolerated treatment well Patient left: in bed;with call bell/phone within reach;with nursing in room   GP     [facility] 01/02/2012, 1:21 PM

## 2012-01-02 NOTE — Progress Notes (Signed)
Patient ID: Frank Ashley, male   DOB: [birth year], 71 y.o.   MRN: [id number]  9 Days Post-Op Subjective: Patient seems to be better with regard to pain controlled compared to yesterday but still with significant pain which is fairly constant through right posterior chest and flank.  He also has intermittent sharp shooting pains in right upper abdomen.  No nausea or vomiting.  Abdominal drain output was high yesterday and was checked for a creatinine level.  Objective: Vital signs in last 24 hours: Temp:  [98.2 F (36.8 C)-98.7 F (37.1 C)] 98.7 F (37.1 C) (09/01 0500) Pulse Rate:  [78-87] 87  (09/01 0500) Resp:  [18] 18  (09/01 0500) BP: (130-146)/(51) 146/51 mmHg (09/01 0500) SpO2:  [94 %-98 %] 94 % (09/01 0500)  Intake/Output from previous day: 08/31 0701 - 09/01 0700 In: 480 [P.O.:480] Out: 1700 [Urine:1200; Drains:500] Intake/Output this shift:    Physical Exam:  General: Alert and oriented Abdomen: Soft, ND Back: Mild point tenderness over right flank. No erythema.  Lab Results:  [facility] 12/31/11 0356  HGB 8.4*  HCT 25.7*   BMET  Basename 12/31/11 0356  NA 136  K 3.4*  CL 101  CO2 27  GLUCOSE 147*  BUN 8  CREATININE 1.16  CALCIUM 8.1*     Studies/Results: JP Cr 0.6  Assessment/Plan: -- No evidence of urine leak.  Cannot explain patient's pain symptoms from a GU perspective related to partial nephrectomy. If pain is not well controlled and no explanation from a general surgery perspective, would consider imaging to evaluate pain symptoms further.   LOS: 13 days   Zabria Liss,LES 01/02/2012, 9:55 AM

## 2012-01-02 NOTE — Progress Notes (Signed)
Patient ID: Frank Ashley, male   DOB: 06-07-1940, 71 y.o.   MRN: [id number]  General Surgery - [facility] - Progress Note  Subjective: Patient complains of mild to moderate right shoulder pain.  No nausea or emesis.  Ambulatory.  Objective: Vital signs in last 24 hours: Temp:  [98.2 F (36.8 C)-98.7 F (37.1 C)] 98.7 F (37.1 C) (09/01 0500) Pulse Rate:  [78-87] 87  (09/01 0500) Resp:  [18] 18  (09/01 0500) BP: (130-146)/(51) 146/51 mmHg (09/01 0500) SpO2:  [94 %-98 %] 94 % (09/01 0500) Last BM Date: 01/01/12  Intake/Output from previous day: 08/31 0701 - 09/01 0700 In: 480 [P.O.:480] Out: 1700 [Urine:1200; Drains:500]  Exam: HEENT - clear, not icteric Neck - soft Chest - clear bilaterally Cor - RRR, no murmur Abd - soft, obese; BS present; T-tube with good quality bile output; drain with thin serosanguinous output; wound with mild thin brown serous drainage - dressing changed Ext - no significant edema Neuro - grossly intact, no focal deficits  Lab Results:   [facility] 12/31/11 0356  WBC 8.6  HGB 8.4*  HCT 25.7*  PLT 471*     Basename 12/31/11 0356  NA 136  K 3.4*  CL 101  CO2 27  GLUCOSE 147*  BUN 8  CREATININE 1.16  CALCIUM 8.1*    Studies/Results: No results found.  Assessment / Plan: 1.  Status post CBD exploration, replacement of dislodged T-tube  - regular diet  - ambulate  - on po abx (Augmentin)  - monitor drain output  - check labs in AM 9/2  - possible discharge 1-2 days  Velora Heckler, MD, [facility] Office: [phone]  01/02/2012

## 2012-01-03 LAB — CBC
Hemoglobin: 8.3 g/dL — ABNORMAL LOW (ref 13.0–17.0)
MCH: 24.9 pg — ABNORMAL LOW (ref 26.0–34.0)
MCHC: 31.6 g/dL (ref 30.0–36.0)

## 2012-01-03 LAB — GLUCOSE, CAPILLARY

## 2012-01-03 LAB — BASIC METABOLIC PANEL
BUN: 13 mg/dL (ref 6–23)
Calcium: 7.7 mg/dL — ABNORMAL LOW (ref 8.4–10.5)
GFR calc non Af Amer: 49 mL/min — ABNORMAL LOW (ref >90)
Glucose, Bld: 313 mg/dL — ABNORMAL HIGH (ref 70–99)
Sodium: 134 mEq/L — ABNORMAL LOW (ref 135–145)

## 2012-01-03 MED ORDER — HYDROMORPHONE HCL 4 MG PO TABS
8.0000 mg | ORAL_TABLET | Freq: Once | ORAL | Status: AC
  Administered 2012-01-03: 8 mg via ORAL

## 2012-01-03 NOTE — Progress Notes (Signed)
Physical Therapy Treatment Patient Details Name: Frank Ashley MRN: [id number] DOB: 16-Jun-1940 Today's Date: 01/03/2012 Time: 1012-1030 PT Time Calculation (min): 18 min  PT Assessment / Plan / Recommendation Comments on Treatment Session  Pt very motivated to get better.  Great attitude. Asked pt about using a cane and he stated, "I won't need it".     Follow Up Recommendations  Home health PT    Barriers to Discharge        Equipment Recommendations       Recommendations for Other Services    Frequency Min 3X/week   Plan Discharge plan remains appropriate    Precautions / Restrictions Precautions Precautions: Fall Precaution Comments: 2 abdominal drains Restrictions Weight Bearing Restrictions: No    Pertinent Vitals/Pain C/o R posterior flank pain    Mobility  Bed Mobility Bed Mobility: Supine to Sit Supine to Sit: 5: Supervision Details for Bed Mobility Assistance: HOB elevated 40' and use of rail with increased time  Transfers Transfers: Sit to Stand;Stand to Sit Sit to Stand: 5: Supervision;From bed Stand to Sit: 6: Modified independent (Device/Increase time);To chair/3-in-1 Details for Transfer Assistance: increased time  Ambulation/Gait Ambulation/Gait Assistance: 4: Min guard Ambulation Distance (Feet): 250 Feet Assistive device: None Ambulation/Gait Assistance Details: RW delivered and in room however pt stated, "I won't use it".  Amb pt w/o AD in halwway but then he syeady himself using the hall rails and furniture in his room. Gait Pattern: Step-to pattern;Decreased stride length;Trunk flexed Gait velocity: decreased General Gait Details: Pt demon mild unsteady, drunken gait in which he steadys himself using wall/rails/furniture     PT Goals                         progressing    Visit Information  Last PT Received On: 01/03/12 Assistance Needed: +1    Subjective Data  Subjective: I will do all I can to get better.  There are worse off people  than me. Patient Stated Goal: home   Cognition    good   Balance   fair  End of Session PT - End of Session Equipment Utilized During Treatment: Gait belt Activity Tolerance: Patient tolerated treatment well Patient left: in chair;with call bell/phone within reach Nurse Communication: Mobility status (RN observed)   Felecia Shelling  PTA [facility]  Acute  Rehab Pager     [phone]

## 2012-01-03 NOTE — Progress Notes (Signed)
Patient c/o of right flank/shoulder pain. Pain meds not due at this time. Notified Dr. Gerrit Friends and K-Pad order received. Obtained K-pad. K-pad placed under patient. Plan of care discussed with patient. Patient verbalized understanding.

## 2012-01-03 NOTE — Progress Notes (Signed)
Called to patients room to evaluate pain issues.  Patient complaining of pain in rt flank area and back with little to no relief per patient.  Patient up in chair.  Spoke with patient concerning plan of care and repositioned for comfort with emotional support given.  Nursing supervisor called to speak with patient concerning pain control and patient concerns.  Plan discussed with patient.  Patient verbalized understanding.  Patient assisted back to bed without difficulty.  Patient tolerated well.  [phone]- Dr Gerrit Friends called to discuss further pain control options for patient.  Order given for additional PO dilaudid 8mg  x1.  Plan discussed with patient.  Patient administered pain medication per order.  Patient verbalized understanding, call light within reach.  Patient expresses appreciation for pain control measures.  K pad in place.

## 2012-01-03 NOTE — Progress Notes (Signed)
Patient ID: Frank Ashley, male   DOB: [birth year], 71 y.o.   MRN: [id number]  General Surgery - [facility] - Progress Note  Subjective: Patient out of bed to chair.  Using considerable amounts of po narcotics for right shoulder and flank pain.  Objective: Vital signs in last 24 hours: Temp:  [97.8 F (36.6 C)-98.6 F (37 C)] 98.5 F (36.9 C) (09/02 0545) Pulse Rate:  [81-86] 81  (09/02 0545) Resp:  [18] 18  (09/02 0545) BP: (123-153)/(72-77) 142/72 mmHg (09/02 0545) SpO2:  [96 %] 96 % (09/02 0545) Last BM Date: 01/02/12  Intake/Output from previous day: 09/01 0701 - 09/02 0700 In: 720 [P.O.:720] Out: 910 [Urine:300; Drains:610]  Exam: HEENT - clear, not icteric Neck - soft Chest - clear bilaterally Cor - RRR, no murmur Abd - obese, dressings dry and intact; T-tube with good quality bile; JP with serosanguinous fluid - no sign of leak from CBD Ext - no significant edema Neuro - grossly intact, no focal deficits  Lab Results:   [facility] 01/03/12 0356  WBC 7.5  HGB 8.3*  HCT 26.3*  PLT 452*     Basename 01/03/12 0356  NA 134*  K 4.2  CL 102  CO2 27  GLUCOSE 313*  BUN 13  CREATININE 1.41*  CALCIUM 7.7*    Studies/Results: No results found.  Assessment / Plan: 1.  Status post partial nephrectomy, cholecystectomy, and CBDE  - encouraged patient to ambulate and stay OOB today  - encouraged patient to decrease narcotic usage  - per urology note, if pain persists may need repeat CT scan of abdomen  - will check LFT's in AM 9/3  - hyperglycemia - SSI ordered  Velora Heckler, MD, [facility] Office: [phone]  01/03/2012

## 2012-01-03 NOTE — Progress Notes (Addendum)
Patient ID: Frank Ashley, male   DOB: Sep 06, 1940, 71 y.o.   MRN: [id number]  10 Days Post-Op Subjective: Patient still with significant right flank pain of uncertain etiology.  No evidence for urine leak seen based on creatinine level from drain. Patient does state this pain feels similar to pain he had prior to surgery.  Objective: Vital signs in last 24 hours: Temp:  [97.8 F (36.6 C)-98.6 F (37 C)] 98.5 F (36.9 C) (09/02 0545) Pulse Rate:  [81-86] 81  (09/02 0545) Resp:  [18] 18  (09/02 0545) BP: (123-153)/(72-77) 142/72 mmHg (09/02 0545) SpO2:  [96 %] 96 % (09/02 0545)  Intake/Output from previous day: 09/01 0701 - 09/02 0700 In: 720 [P.O.:720] Out: 910 [Urine:300; Drains:610] Intake/Output this shift:    Physical Exam:  General: Alert and oriented Abdomen: Soft, ND Back: Mild point tenderness over right flank. No erythema.  Lab Results:  [facility] 01/03/12 0356  HGB 8.3*  HCT 26.3*   BMET  Basename 01/03/12 0356  NA 134*  K 4.2  CL 102  CO2 27  GLUCOSE 313*  BUN 13  CREATININE 1.41*  CALCIUM 7.7*     Studies/Results: JP Cr 0.6  Assessment/Plan: Renal cell carcinoma s/p partial nephrectomy - Stable from GU standpoint.  Pain symptoms of uncertain etiology and no GU etiology suspected. Would consider further radiologic evaluation if pain remains uncontrolled and no obvious explanation is identified. CT of abdomen with and without IV contrast of abdomen would evaluate the kidney if radiologic evaluation is warranted per General Surgery. However, pt does say this is similar pain to what he had prior to surgery raising suspicion that this may be unrelated to his kidney or recent surgery on gallbladder.  ? Musculoskeletal or neurologic etiology.   LOS: 14 days   Skylynn Burkley,LES 01/03/2012, 9:39 AM

## 2012-01-04 ENCOUNTER — Inpatient Hospital Stay (HOSPITAL_COMMUNITY): Payer: Medicare Other

## 2012-01-04 DIAGNOSIS — E119 Type 2 diabetes mellitus without complications: Secondary | ICD-10-CM

## 2012-01-04 LAB — COMPREHENSIVE METABOLIC PANEL
ALT: 9 U/L (ref 0–53)
AST: 13 U/L (ref 0–37)
CO2: 26 mEq/L (ref 19–32)
Calcium: 7.9 mg/dL — ABNORMAL LOW (ref 8.4–10.5)
Chloride: 100 mEq/L (ref 96–112)
GFR calc Af Amer: 60 mL/min — ABNORMAL LOW (ref >90)
GFR calc non Af Amer: 52 mL/min — ABNORMAL LOW (ref >90)
Glucose, Bld: 251 mg/dL — ABNORMAL HIGH (ref 70–99)
Sodium: 132 mEq/L — ABNORMAL LOW (ref 135–145)
Total Bilirubin: 0.3 mg/dL (ref 0.3–1.2)

## 2012-01-04 LAB — GLUCOSE, CAPILLARY
Glucose-Capillary: 165 mg/dL — ABNORMAL HIGH (ref 70–99)
Glucose-Capillary: 137 mg/dL — ABNORMAL HIGH (ref 70–99)

## 2012-01-04 MED ORDER — INSULIN ASPART PROT & ASPART (70-30 MIX) 100 UNIT/ML ~~LOC~~ SUSP
25.0000 [IU] | Freq: Two times a day (BID) | SUBCUTANEOUS | Status: DC
  Administered 2012-01-04 – 2012-01-05 (×3): 25 [IU] via SUBCUTANEOUS
  Filled 2012-01-04: qty 3

## 2012-01-04 NOTE — Progress Notes (Signed)
Physical Therapy Treatment Patient Details Name: Frank Ashley MRN: [id number] DOB: 03-17-1941 Today's Date: 01/04/2012 Time: 4098-1191 PT Time Calculation (min): 25 min  PT Assessment / Plan / Recommendation Comments on Treatment Session  Pt continues to be motivated to get better.  Pt did like the cane vs the RW. Advised Care Coordinator to switch equipment from RW to [facility].    Follow Up Recommendations  Home health PT    Barriers to Discharge        Equipment Recommendations  Cane    Recommendations for Other Services    Frequency Min 3X/week   Plan Discharge plan remains appropriate    Precautions / Restrictions Precautions Precautions: Fall Precaution Comments: 2 abdominal drains  Restrictions Weight Bearing Restrictions: No    Pertinent Vitals/Pain C/o R flank pain    Mobility  Bed Mobility Bed Mobility: Supine to Sit;Sit to Supine Rolling Right: 6: Modified independent (Device/Increase time);With rail Supine to Sit: 5: Supervision Sit to Supine: 5: Supervision Details for Bed Mobility Assistance: increased time  Transfers Transfers: Sit to Stand;Stand to Sit Sit to Stand: 5: Supervision Stand to Sit: 5: Supervision Details for Transfer Assistance: increased time due to R flank pain  Ambulation/Gait Ambulation/Gait Assistance: 4: Min guard;4: Min assist Ambulation Distance (Feet): 275 Feet Assistive device: Straight cane Ambulation/Gait Assistance Details: amb with SPC as pt cont to decline the need for a RW.  Pt demon mild, staggered gait holding to the hall rails and furniture.  Pt did like to cane vs the RW. Gait Pattern: Step-through pattern;Ataxic Gait velocity: decreased     PT Goals                       progressing    Visit Information  Last PT Received On: 01/04/12 Assistance Needed: +1             End of Session PT - End of Session Equipment Utilized During Treatment: Gait belt Activity Tolerance: Patient tolerated treatment  well Patient left: in bed;with call bell/phone within reach Nurse Communication: Mobility status (RN observed)    Felecia Shelling  PTA [facility]  Acute  Rehab Pager     [phone]

## 2012-01-04 NOTE — Progress Notes (Signed)
Occupational Therapy Treatment Patient Details Name: Frank Ashley MRN: [id number] DOB: 1941-04-12 Today's Date: 01/04/2012 Time: 4098-1191 OT Time Calculation (min): 24 min  OT Assessment / Plan / Recommendation Comments on Treatment Session      Follow Up Recommendations  No OT follow up    Barriers to Discharge       Equipment Recommendations  3 in 1 bedside comode;None recommended by OT;Other (comment) (?SPC)    Recommendations for Other Services    Frequency     Plan Discharge plan needs to be updated    Precautions / Restrictions Precautions Precautions: Fall Precaution Comments: 2 abdominal drains Restrictions Weight Bearing Restrictions: No   Pertinent Vitals/Pain Initially none but back began to hurt him when he was standing for adls.  Pt declined pain meds but repositioned and used existing heating pad    ADL  Upper Body Bathing: Performed;Modified independent Where Assessed - Upper Body Bathing: Unsupported standing Lower Body Bathing: Performed;Modified independent Where Assessed - Lower Body Bathing: Supported sit to stand Upper Body Dressing: Performed (min A to tie gown) Where Assessed - Upper Body Dressing: Unsupported sitting Lower Body Dressing: Performed;Modified independent Where Assessed - Lower Body Dressing: Unsupported sit to stand Transfers/Ambulation Related to ADLs: pt ambulated to bathroom at mod I level and was able to retrieve adls items.  C/O back pain when standing and had to sit to complete adl.  Did not want pain meds but used heating pad ADL Comments: Pt was able to bring legs up, crossing to access feet.  Did not increase pain.  Pt states he is doing well with HEP:  did not address today.      OT Diagnosis:    OT Problem List:   OT Treatment Interventions:     OT Goals ADL Goals Pt Will Transfer to Toilet: with modified independence;Ambulation;3-in-1 ADL Goal: Toilet Transfer - Progress: Met Pt Will Perform Toileting - Hygiene:  with modified independence;Sit to stand from 3-in-1/toilet ADL Goal: Toileting - Hygiene - Progress: Met Miscellaneous OT Goals Miscellaneous OT Goal #1: Pt will verbalize use of AE for adls OT Goal: Miscellaneous Goal #1 - Progress: Met Miscellaneous OT Goal #2: pt will be mod I with ADLS with AE, prn OT Goal: Miscellaneous Goal #2 - Progress: Goal set today  Visit Information  Last OT Received On: 01/04/12 Assistance Needed: +1    Subjective Data      Prior Functioning       Cognition  Overall Cognitive Status: Appears within functional limits for tasks assessed/performed Behavior During Session: [facility] for tasks performed    Mobility  Shoulder Instructions Bed Mobility Rolling Right: 6: Modified independent (Device/Increase time);With rail Transfers Sit to Stand: 6: Modified independent (Device/Increase time)       Exercises      Balance     End of Session OT - End of Session Activity Tolerance: Patient tolerated treatment well Patient left: in bed;with call bell/phone within reach  GO     [facility] 01/04/2012, 10:30 AM Marica Otter, OTR/L [phone] 01/04/2012

## 2012-01-04 NOTE — Progress Notes (Signed)
11 Days Post-Op Subjective: Patient reports right flank pain. Voiding without difficulty. Normal BM.   Objective: Vital signs in last 24 hours: Temp:  [98.7 F (37.1 C)-99.7 F (37.6 C)] 98.7 F (37.1 C) (09/03 0439) Pulse Rate:  [77-92] 77  (09/03 0439) Resp:  [16-20] 20  (09/03 0439) BP: (120-133)/(63-70) 130/66 mmHg (09/03 0439) SpO2:  [93 %-95 %] 95 % (09/03 0439)  Intake/Output from previous day: 09/02 0701 - 09/03 0700 In: 480 [P.O.:480] Out: 740 [Drains:740] Intake/Output this shift:    Physical Exam:  NAD Abd - soft, NT  Lab Results:  [facility] 01/03/12 0356  HGB 8.3*  HCT 26.3*  WBC = 7.5 BMET  Basename 01/04/12 0414 01/03/12 0356  NA 132* 134*  K 4.5 4.2  CL 100 102  CO2 26 27  GLUCOSE 251* 313*  BUN 14 13  CREATININE 1.34 1.41*  CALCIUM 7.9* 7.7*   No results found for this basename: LABPT:3,INR:3 in the last 72 hours No results found for this basename: LABURIN:1 in the last 72 hours Results for orders placed during the hospital encounter of 12/14/11  SURGICAL PCR SCREEN     Status: Normal   Collection Time   12/14/11 11:26 AM      Component Value Range Status Comment   MRSA, PCR NEGATIVE  NEGATIVE Final    Staphylococcus aureus NEGATIVE  NEGATIVE Final     Studies/Results: Ct Abdomen Pelvis Wo Contrast  01/04/2012  *RADIOLOGY REPORT*  Clinical Data: Flank pain  CT ABDOMEN AND PELVIS WITHOUT CONTRAST  Technique:  Multidetector CT imaging of the abdomen and pelvis was performed following the standard protocol without intravenous contrast.  Comparison: 12/26/2011  Findings: Atelectasis is noted in both lung bases.  Postoperative changes of recent cholecystectomy identified.  There is a drainage catheter identified within the gallbladder fossa. Gas is noted within the gallbladder fossa.  There is no focal fluid collections noted.  The pancreas is normal.  Normal appearance of the spleen.  The right adrenal gland appears normal.  Nodule in the left adrenal  gland is unchanged measuring 1.8 cm and 44 HU, image 34.  The left kidney is normal.  There are changes compatible with partial right nephrectomy.  Fluid collection in the nephrectomy bed contains gas and measures 5.6 x 5.7 x 3.3 cm, image 51.  Previously this fluid collection measured 5.7 x 4.30 x 3.5 cm.  Gas is noted within the lumen of the urinary bladder.  Prostate gland and seminal vesicles are unremarkable.  No adenopathy noted within the upper abdomen.  There is no pelvic or inguinal adenopathy.  The debris and gas fills the lumen of the stomach.  The small bowel loops have a normal caliber without evidence for obstruction. There is moderate stool identified throughout the colon up to the level of the rectum.  Review of the visualized osseous structures is significant for lumbar degenerative disc disease.  IMPRESSION:  1.  No evidence for bowel obstruction or ileus. 2.  Fluid collection within partial nephrectomy bed is identified containing gas.  This is not significantly changed in size from 12/24/2011. 3.  Stable position of drainage catheters within the gallbladder fossa with a small amount of surrounding gas.  No fluid collection within the gallbladder fossa noted.   Original Report Authenticated By: Rosealee Albee, M.D.     Assessment/Plan: S/p right partial nephrectomy (Nx) - reviewed CT with colleagues. There is a small stable fluid collection inferior to kidney. There is air density in the  partial Nx bed which can be seen after closing the kidney over Surgicel and this finding can persist for a few months. I would continue observation from a Gu point of view in light of stable vitals and no WBC count in lieu of aspiration or drainage. I plan to rescan in 3-4 months to monitor for tumor recurrence.    LOS: 15 days   Antony Haste 01/04/2012, 10:11 AM

## 2012-01-04 NOTE — Progress Notes (Signed)
11 Days Post-Op  Subjective: Pt walking better.  Right flank pain still an issue.  Severe at times near right scapula.  No worse with inspiration  Objective: Vital signs in last 24 hours: Temp:  [98.7 F (37.1 C)-99.7 F (37.6 C)] 98.7 F (37.1 C) (09/03 0439) Pulse Rate:  [77-92] 77  (09/03 0439) Resp:  [16-20] 20  (09/03 0439) BP: (120-133)/(63-70) 130/66 mmHg (09/03 0439) SpO2:  [93 %-95 %] 95 % (09/03 0439) Last BM Date: 01/02/12  Intake/Output from previous day: 09/02 0701 - 09/03 0700 In: 480 [P.O.:480] Out: 740 [Drains:740] Intake/Output this shift: Total I/O In: -  Out: 440 [Drains:440]  Incision/Wound:clean dry intact.  Sore to palpation right scapular region.  JP serous and T tube draining well.  Lab Results:   [facility] 01/03/12 0356  WBC 7.5  HGB 8.3*  HCT 26.3*  PLT 452*   BMET  Basename 01/04/12 0414 01/03/12 0356  NA 132* 134*  K 4.5 4.2  CL 100 102  CO2 26 27  GLUCOSE 251* 313*  BUN 14 13  CREATININE 1.34 1.41*  CALCIUM 7.9* 7.7*   PT/INR No results found for this basename: LABPROT:2,INR:2 in the last 72 hours ABG No results found for this basename: PHART:2,PCO2:2,PO2:2,HCO3:2 in the last 72 hours  Studies/Results: No results found.  Anti-infectives: Anti-infectives     Start     Dose/Rate Route Frequency Ordered Stop   12/31/11 1300   amoxicillin-clavulanate (AUGMENTIN) 875-125 MG per tablet 1 tablet        1 tablet Oral Every 12 hours 12/31/11 1157     12/31/11 0000   amoxicillin-clavulanate (AUGMENTIN) 875-125 MG per tablet        1 tablet Oral Every 12 hours 12/31/11 1222 01/10/12 2359   12/24/11 1523   vancomycin (VANCOCIN) 1,500 mg in sodium chloride 0.9 % 500 mL IVPB  Status:  Discontinued        1,500 mg 250 mL/hr over 120 Minutes Intravenous 120 min pre-op 12/24/11 1523 12/24/11 1918   12/23/11 1200   cefOXitin (MEFOXIN) 1 g in dextrose 5 % 50 mL IVPB  Status:  Discontinued        1 g 100 mL/hr over 30 Minutes Intravenous  Every 8 hours 12/23/11 1037 12/31/11 1157   12/22/11 0900   ciprofloxacin (CIPRO) IVPB 400 mg  Status:  Discontinued        400 mg 200 mL/hr over 60 Minutes Intravenous Every 12 hours 12/22/11 0800 12/23/11 0732   12/20/11 0630   ciprofloxacin (CIPRO) IVPB 400 mg        400 mg 200 mL/hr over 60 Minutes Intravenous 120 min pre-op 12/20/11 0617 12/20/11 0720          Assessment/Plan: s/p Procedure(s) (LRB): EXPLORATORY LAPAROTOMY (N/A) INTRAOPERATIVE CHOLANGIOGRAM (N/A) Pain an issue.  No IV access.  Check CT today without contrast.  If unrevealing,  Plan discharge to home wed.  Will increase insulin coverage. Staples out today.  LOS: 15 days    Frank Ashley A. 01/04/2012

## 2012-01-05 ENCOUNTER — Telehealth (INDEPENDENT_AMBULATORY_CARE_PROVIDER_SITE_OTHER): Payer: Self-pay

## 2012-01-05 LAB — GLUCOSE, CAPILLARY
Glucose-Capillary: 123 mg/dL — ABNORMAL HIGH (ref 70–99)
Glucose-Capillary: 155 mg/dL — ABNORMAL HIGH (ref 70–99)

## 2012-01-05 MED ORDER — POLYETHYLENE GLYCOL 3350 17 G PO PACK: 17.0000 g | PACK | Freq: Two times a day (BID) | ORAL | Status: AC

## 2012-01-05 MED ORDER — ALPRAZOLAM 0.5 MG PO TABS: 0.5000 mg | ORAL_TABLET | Freq: Three times a day (TID) | ORAL | Status: DC | PRN

## 2012-01-05 MED ORDER — HYDROMORPHONE HCL 4 MG PO TABS: 2.0000 mg | ORAL_TABLET | ORAL | Status: DC | PRN

## 2012-01-05 NOTE — Discharge Summary (Signed)
Physician Discharge Summary  Patient ID: Frank Ashley MRN: [id number] DOB/AGE: 1940/05/27 71 y.o.  Admit date: 12/20/2011 Discharge date: 01/05/2012  Admission Diagnoses: Patient Active Problem List  Diagnosis  . MRSA  . DIABETIC FOOT ULCER, TOE  . HYPERLIPIDEMIA  . Overweight  . PERIPHERAL NEUROPATHY  . Allergic rhinitis, cause unspecified  . GERD  . RENAL INSUFFICIENCY, CHRONIC  . SLEEP APNEA  . Ascending cholangitis  . Bacteremia due to Escherichia coli  . Right renal mass  . CAD (coronary artery disease)  . Hypertension  . Diabetes mellitus type 2, insulin dependent  . Hyperlipidemia  . Ejection fraction  . Biliary drain displacement - T-tube replaced    Discharge Diagnoses:  Patient Active Problem List  Diagnosis  . MRSA  . DIABETIC FOOT ULCER, TOE  . HYPERLIPIDEMIA  . Overweight  . PERIPHERAL NEUROPATHY  . Allergic rhinitis, cause unspecified  . GERD  . RENAL INSUFFICIENCY, CHRONIC  . SLEEP APNEA  . Ascending cholangitis  . Bacteremia due to Escherichia coli  . Right renal mass  . CAD (coronary artery disease)  . Hypertension  . Diabetes mellitus type 2, insulin dependent  . Hyperlipidemia  . Ejection fraction  . Biliary drain displacement - T-tube replaced   Principal Problem:  *Biliary drain displacement - T-tube replaced Active Problems:  MRSA  HYPERLIPIDEMIA  Overweight  PERIPHERAL NEUROPATHY  GERD  RENAL INSUFFICIENCY, CHRONIC  SLEEP APNEA  Ascending cholangitis  Right renal mass  Hypertension  Diabetes mellitus type 2, insulin dependent  Hyperlipidemia   Discharged Condition: fair  Hospital Course: Pt underwent open cholecystectomy and CBD exploration complicated by T tube dislodgement on POD 4 requiring repeat ex lap.  Very weak and debilitated post op.  Also had partial nephrectomy for RCC on right.  Diabetes control achieved and diet slowly resumed.  T  Tube draining bile and colonic distension resolved. Discharged home on POD  16.  Consults: urology  Significant Diagnostic Studies:  Results for orders placed during the hospital encounter of 12/20/11 (from the past 72 hour(s))  GLUCOSE, CAPILLARY     Status: Abnormal   Collection Time   01/02/12  8:09 AM      Component Value Range Comment   Glucose-Capillary 279 (*) 70 - 99 mg/dL   GLUCOSE, CAPILLARY     Status: Abnormal   Collection Time   01/02/12  8:11 AM      Component Value Range Comment   Glucose-Capillary 242 (*) 70 - 99 mg/dL   GLUCOSE, CAPILLARY     Status: Abnormal   Collection Time   01/02/12 12:14 PM      Component Value Range Comment   Glucose-Capillary 58 (*) 70 - 99 mg/dL   GLUCOSE, CAPILLARY     Status: Abnormal   Collection Time   01/02/12  1:06 PM      Component Value Range Comment   Glucose-Capillary 66 (*) 70 - 99 mg/dL   GLUCOSE, CAPILLARY     Status: Normal   Collection Time   01/02/12  5:21 PM      Component Value Range Comment   Glucose-Capillary 97  70 - 99 mg/dL   GLUCOSE, CAPILLARY     Status: Abnormal   Collection Time   01/02/12 10:22 PM      Component Value Range Comment   Glucose-Capillary 185 (*) 70 - 99 mg/dL   CBC     Status: Abnormal   Collection Time   01/03/12  3:56 AM  Component Value Range Comment   WBC 7.5  4.0 - 10.5 K/uL    RBC 3.33 (*) 4.22 - 5.81 MIL/uL    Hemoglobin 8.3 (*) 13.0 - 17.0 g/dL    HCT 40.9 (*) 81.1 - 52.0 %    MCV 79.0  78.0 - 100.0 fL    MCH 24.9 (*) 26.0 - 34.0 pg    MCHC 31.6  30.0 - 36.0 g/dL    RDW 91.4 (*) 78.2 - 15.5 %    Platelets 452 (*) 150 - 400 K/uL   BASIC METABOLIC PANEL     Status: Abnormal   Collection Time   01/03/12  3:56 AM      Component Value Range Comment   Sodium 134 (*) 135 - 145 mEq/L    Potassium 4.2  3.5 - 5.1 mEq/L    Chloride 102  96 - 112 mEq/L    CO2 27  19 - 32 mEq/L    Glucose, Bld 313 (*) 70 - 99 mg/dL    BUN 13  6 - 23 mg/dL    Creatinine, Ser 9.56 (*) 0.50 - 1.35 mg/dL    Calcium 7.7 (*) 8.4 - 10.5 mg/dL    GFR calc non Af Amer 49 (*) >90 mL/min     GFR calc Af Amer 57 (*) >90 mL/min   GLUCOSE, CAPILLARY     Status: Abnormal   Collection Time   01/03/12  7:55 AM      Component Value Range Comment   Glucose-Capillary 293 (*) 70 - 99 mg/dL    Comment 1 Notify RN      Comment 2 Documented in Chart     GLUCOSE, CAPILLARY     Status: Abnormal   Collection Time   01/03/12 12:27 PM      Component Value Range Comment   Glucose-Capillary 215 (*) 70 - 99 mg/dL    Comment 1 Notify RN      Comment 2 Documented in Chart     GLUCOSE, CAPILLARY     Status: Abnormal   Collection Time   01/03/12  4:03 PM      Component Value Range Comment   Glucose-Capillary 307 (*) 70 - 99 mg/dL   GLUCOSE, CAPILLARY     Status: Abnormal   Collection Time   01/03/12  8:46 PM      Component Value Range Comment   Glucose-Capillary 161 (*) 70 - 99 mg/dL   COMPREHENSIVE METABOLIC PANEL     Status: Abnormal   Collection Time   01/04/12  4:14 AM      Component Value Range Comment   Sodium 132 (*) 135 - 145 mEq/L    Potassium 4.5  3.5 - 5.1 mEq/L    Chloride 100  96 - 112 mEq/L    CO2 26  19 - 32 mEq/L    Glucose, Bld 251 (*) 70 - 99 mg/dL    BUN 14  6 - 23 mg/dL    Creatinine, Ser 2.13  0.50 - 1.35 mg/dL    Calcium 7.9 (*) 8.4 - 10.5 mg/dL    Total Protein 5.2 (*) 6.0 - 8.3 g/dL    Albumin 2.1 (*) 3.5 - 5.2 g/dL    AST 13  0 - 37 U/L    ALT 9  0 - 53 U/L    Alkaline Phosphatase 87  39 - 117 U/L    Total Bilirubin 0.3  0.3 - 1.2 mg/dL    GFR calc non Af Amer 52 (*) >  90 mL/min    GFR calc Af Amer 60 (*) >90 mL/min   GLUCOSE, CAPILLARY     Status: Abnormal   Collection Time   01/04/12  7:16 AM      Component Value Range Comment   Glucose-Capillary 273 (*) 70 - 99 mg/dL    Comment 1 Notify RN      Comment 2 Documented in Chart     GLUCOSE, CAPILLARY     Status: Abnormal   Collection Time   01/04/12 11:38 AM      Component Value Range Comment   Glucose-Capillary 165 (*) 70 - 99 mg/dL    Comment 1 Notify RN      Comment 2 Documented in Chart     GLUCOSE,  CAPILLARY     Status: Abnormal   Collection Time   01/04/12  4:05 PM      Component Value Range Comment   Glucose-Capillary 137 (*) 70 - 99 mg/dL    Comment 1 Notify RN      Comment 2 Documented in Chart     GLUCOSE, CAPILLARY     Status: Abnormal   Collection Time   01/04/12  9:13 PM      Component Value Range Comment   Glucose-Capillary 110 (*) 70 - 99 mg/dL   GLUCOSE, CAPILLARY     Status: Abnormal   Collection Time   01/05/12  7:58 AM      Component Value Range Comment   Glucose-Capillary 123 (*) 70 - 99 mg/dL    Comment 1 Notify RN      Comment 2 Documented in Chart     CT-scan of abdomen and pelvis X2  Treatments: surgery: Open cholecystectomy and CBD exploration times 2   Discharge Exam: Blood pressure 140/45, pulse 86, temperature 99.2 F (37.3 C), temperature source Oral, resp. rate 18, height 6\' 1"  (1.854 m), weight 306 lb 3.5 oz (138.9 kg), SpO2 96.00%. Resp: clear to auscultation bilaterally Cardio: regular rate and rhythm, S1, S2 normal, no murmur, click, rub or gallop Incision/Wound:clean and intact with scant drainage.  T tube bile and JP scant.  Disposition: 01-Home or Self Care  Discharge Orders    Future Orders Please Complete By Expires   Diet Carb Modified      Increase activity slowly      Driving Restrictions      Comments:   No driving for 4 weeks   Lifting restrictions      Comments:   No lifting pushing pulling.   Discharge instructions      Comments:   Empty drains daily.  Home health.  Home PT     Medication List  As of 01/05/2012  8:03 AM   TAKE these medications         ALPRAZolam 0.5 MG tablet   Commonly known as: XANAX   Take 1 tablet (0.5 mg total) by mouth 3 (three) times daily as needed for anxiety or sleep.      amoxicillin-clavulanate 875-125 MG per tablet   Commonly known as: AUGMENTIN   Take 1 tablet by mouth every 12 (twelve) hours.      aspirin 325 MG EC tablet   Take 975 mg by mouth 2 (two) times daily.      fenofibrate 160  MG tablet   Take 160 mg by mouth every evening.      furosemide 40 MG tablet   Commonly known as: LASIX   Take 40 mg by mouth daily as needed. For  fluid retention.      gabapentin 800 MG tablet   Commonly known as: NEURONTIN   Take 800 mg by mouth 5 (five) times daily.      HYDROmorphone 4 MG tablet   Commonly known as: DILAUDID   Take 4 mg by mouth every 6 (six) hours as needed. For pain  (FOR GALLBLADDER PAIN)      HYDROmorphone 4 MG tablet   Commonly known as: DILAUDID   Take 0.5 tablets (2 mg total) by mouth every 4 (four) hours as needed.      insulin NPH-insulin regular (70-30) 100 UNIT/ML injection   Commonly known as: NOVOLIN 70/30   Inject into the skin See admin instructions. 80 UNITS EVERY AM WITH BREAKFAST  AND 75 UNITS EVERY EVENING WITH SUPPER      lisinopril 10 MG tablet   Commonly known as: PRINIVIL,ZESTRIL   Take 10 mg by mouth daily. TO PROTECT KIDNEYS    TAKES IN AM      omeprazole 20 MG capsule   Commonly known as: PRILOSEC   Take 40 mg by mouth 2 (two) times daily.      oxiconazole 1 % Crea   Commonly known as: OXISTAT   Apply 1 application topically as needed. For SKIN RASHES      polyethylene glycol packet   Commonly known as: MIRALAX / GLYCOLAX   Take 17 g by mouth 2 (two) times daily.      simvastatin 40 MG tablet   Commonly known as: ZOCOR   Take 1 tablet (40 mg total) by mouth at bedtime.      triamcinolone cream 0.1 %   Commonly known as: KENALOG   Apply 1 application topically 2 (two) times daily as needed. For rash on back.           Follow-up Information    Follow up with Keleigh Kazee A., MD in 5 days.   Contact information:   [address] [name] [address] [phone]       Follow up with [facility], NP in 2 weeks.   Contact information:   [address] [facility] [address] [facility] [name] [address] [phone]           Signed: Harriette Bouillon A. 01/05/2012, 8:03 AM

## 2012-01-05 NOTE — Telephone Encounter (Signed)
I called patient to give him his appointment information. I have scheduled his follow up next Friday 01/14/12 @ 10:00. He did not answer so I left the appointment information on his voicemail and told him to call with any questions or concerns.

## 2012-01-06 ENCOUNTER — Telehealth (INDEPENDENT_AMBULATORY_CARE_PROVIDER_SITE_OTHER): Payer: Self-pay | Admitting: General Surgery

## 2012-01-06 NOTE — Telephone Encounter (Signed)
Pt's wife called to report new swelling of feet and ankles, Lt> Rt, with tightness and discomfort.  Paged and updated Dr. Luisa Hart; advised to call PCP for this.  Wife so advised and she will comply.

## 2012-01-10 ENCOUNTER — Telehealth (INDEPENDENT_AMBULATORY_CARE_PROVIDER_SITE_OTHER): Payer: Self-pay | Admitting: General Surgery

## 2012-01-10 NOTE — Telephone Encounter (Signed)
Pt calling to report his is having a lot of drainage from one of his surgical wounds and was expecting home health care.  He has been home to 2 days now.  Notes from Case Mgr at [facility], stated [facility] waiting for him to be discharged, so called [facility] and spoke with [facility].  She read note stating WIFE states no need for home health for wound care or PT.  Advised [facility] that patient himself is calling and requesting help, so she will reinstate the orders to go.  Called pt and told him to expect call by tomorrow.

## 2012-01-13 ENCOUNTER — Ambulatory Visit (INDEPENDENT_AMBULATORY_CARE_PROVIDER_SITE_OTHER): Payer: Medicare Other | Admitting: Surgery

## 2012-01-13 ENCOUNTER — Encounter (INDEPENDENT_AMBULATORY_CARE_PROVIDER_SITE_OTHER): Payer: Self-pay | Admitting: Surgery

## 2012-01-13 VITALS — BP 124/68 | HR 74 | Resp 14 | Ht 73.0 in | Wt 262.4 lb

## 2012-01-13 DIAGNOSIS — Z9889 Other specified postprocedural states: Secondary | ICD-10-CM

## 2012-01-13 MED ORDER — HYDROMORPHONE HCL 4 MG PO TABS: 4.0000 mg | ORAL_TABLET | Freq: Four times a day (QID) | ORAL | Status: DC | PRN

## 2012-01-13 MED ORDER — ALPRAZOLAM 0.5 MG PO TABS: 0.5000 mg | ORAL_TABLET | Freq: Three times a day (TID) | ORAL | Status: DC | PRN

## 2012-01-13 NOTE — Progress Notes (Signed)
Patient returns after open common bile duct exploration and cholecystectomy on 12/20/2011. He also had a right partial nephrectomy by Dr. Mena Goes for renal cell carcinoma. His postoperative course complicated by dislodgment of his T-tube on postop day 4 requiring reexploration. He is doing remarkably well.  Exam: I resecured his JP drain.. I have clamped his T-tube today. There is separation of the incision but no signs of infection in the middle measuring 2 cm. This was probed with Q-tip and fascia is intact.  Impression: Status post open cholecystectomy and common bile duct expiration for choledocholithiasis  Plan: Return 1 week. He is said to T-tube cholangiograms already which showed no retained stone. Removed T-tube. We'll try to keep JP in place for now. This is resecured today using 2-0 nylon.

## 2012-01-13 NOTE — Patient Instructions (Signed)
Return next week.  Keep dry dressing over the wound and change daily.

## 2012-01-14 ENCOUNTER — Encounter (INDEPENDENT_AMBULATORY_CARE_PROVIDER_SITE_OTHER): Payer: Medicare Other | Admitting: Surgery

## 2012-01-15 ENCOUNTER — Telehealth (INDEPENDENT_AMBULATORY_CARE_PROVIDER_SITE_OTHER): Payer: Self-pay | Admitting: General Surgery

## 2012-01-15 NOTE — Telephone Encounter (Signed)
He had his t-tube clamped 2 days ago and is having some pain and nausea now.  I was able to obtain and bile bag so he could put the t-tube to drainage again.  He is going to pick it up in the ED.  I told him to stay on a clear liquid diet for 24 hours.  If he is not better by tomorrow, I told him to call back.

## 2012-01-17 ENCOUNTER — Telehealth (INDEPENDENT_AMBULATORY_CARE_PROVIDER_SITE_OTHER): Payer: Self-pay | Admitting: General Surgery

## 2012-01-17 NOTE — Telephone Encounter (Signed)
Pt called to report his JP drain came out during the night.  The drainage from it has dropped off a lot.  He is covering the site with an absorbant dressing.  Paged and updated Dr. Luisa Hart; OK to leave out.  Pt updated as well.

## 2012-01-18 ENCOUNTER — Telehealth (INDEPENDENT_AMBULATORY_CARE_PROVIDER_SITE_OTHER): Payer: Self-pay

## 2012-01-18 NOTE — Telephone Encounter (Signed)
Patient called in reporting bleeding from drain site that was previously removed, patient able to decrease the bleeding when placing a pressure gauze over the area.  Patient advised to go to the Emergency Room if the bleeding increases or continues, patient becomes light headed or Frank Ashley bleeding cannot be controlled by placing a pressure gauze over the area.  Patient understood and agreed.  Patient has appointment in Urgent Office 01/19/12 @ 2:45 pm w/Dr. Johna Sheriff for further evaluation of drain site.

## 2012-01-19 ENCOUNTER — Encounter (INDEPENDENT_AMBULATORY_CARE_PROVIDER_SITE_OTHER): Payer: Medicare Other | Admitting: General Surgery

## 2012-01-19 ENCOUNTER — Telehealth: Payer: Self-pay | Admitting: Family Medicine

## 2012-01-19 ENCOUNTER — Telehealth (INDEPENDENT_AMBULATORY_CARE_PROVIDER_SITE_OTHER): Payer: Self-pay

## 2012-01-19 NOTE — Telephone Encounter (Signed)
Pt called this morning to report the bleeding from his incision has stopped.  He has it securely bandaged.  Today's appt with Dr. Johna Sheriff in Urgent Office was cancelled.  Pt has a po with Dr. Luisa Hart on 01/20/29.  He was instructed to call us if the bleeding starts again.

## 2012-01-19 NOTE — Telephone Encounter (Signed)
Pt needs refill on alprazolam call into [facility] [phone]

## 2012-01-20 ENCOUNTER — Encounter: Payer: Self-pay | Admitting: Family Medicine

## 2012-01-20 ENCOUNTER — Ambulatory Visit (INDEPENDENT_AMBULATORY_CARE_PROVIDER_SITE_OTHER): Payer: Medicare Other | Admitting: Family Medicine

## 2012-01-20 VITALS — BP 140/72 | Temp 98.0°F | Wt 256.0 lb

## 2012-01-20 DIAGNOSIS — R630 Anorexia: Secondary | ICD-10-CM

## 2012-01-20 DIAGNOSIS — F411 Generalized anxiety disorder: Secondary | ICD-10-CM

## 2012-01-20 DIAGNOSIS — E119 Type 2 diabetes mellitus without complications: Secondary | ICD-10-CM

## 2012-01-20 DIAGNOSIS — Z23 Encounter for immunization: Secondary | ICD-10-CM

## 2012-01-20 DIAGNOSIS — Z794 Long term (current) use of insulin: Secondary | ICD-10-CM

## 2012-01-20 DIAGNOSIS — F419 Anxiety disorder, unspecified: Secondary | ICD-10-CM

## 2012-01-20 MED ORDER — LORAZEPAM 0.5 MG PO TABS: 0.5000 mg | ORAL_TABLET | Freq: Three times a day (TID) | ORAL | Status: DC | PRN

## 2012-01-20 NOTE — Telephone Encounter (Signed)
Noted  

## 2012-01-20 NOTE — Telephone Encounter (Signed)
Rx last filled on 01/13/12 # 20 by Dr. Luisa Hart. Pls advise.

## 2012-01-20 NOTE — Telephone Encounter (Signed)
Has follow up today and discuss then.

## 2012-01-20 NOTE — Progress Notes (Signed)
Subjective:    Patient ID: Frank Ashley, male    DOB: 07-09-1940, 71 y.o.   MRN: [id number]  HPI  Hospital followup. Patient had open cholecystectomy back in August along with partial nephrectomy for right renal cell carcinoma. Complication postop day 4 T-tube dislodgment. He had some generalized weakness since surgery. CBGs are up and down. Overall fairly well controlled. He has reduced his Novolin 70/30 insulin currently to about 30 units twice daily. Prior to surgery was taken about 80 units in the morning and 70 units at night. No recent hypoglycemia. Very slow to pick up oral intake. Poor appetite.  Still has T-tube draining bile fluid. No fever. No chills. No dyspnea. No cough. No abdominal pain.  Patient had tremendous anxiety prior to surgery. Currently alprazolam. Prior to surgery we had him on lorazepam which he felt worked best. No history of abuse. Previously intolerant of SSRIs.    Postoperative hemoglobin 8.3. Creatinine 1.3 at discharge. Albumin 2.1.  Patient has not yet had flu vaccine. Pneumovax up-to-date  Past Medical History  Diagnosis Date  . Hyperlipidemia   . Renal mass, right   . Diabetes mellitus type 2, insulin dependent   . Diabetic foot ulcer CURRENT RIGHT GREAT TOE WOUND W/ DRAINAGE --  DRESSING CHANGE EVERY OTHER DAY  . History of acute pancreatitis 2008  . OSA (obstructive sleep apnea) CPAP USED UP UNTIL 2 YRS AGO  WHEN MASK WAS DAMAGED   . Renal insufficiency   . GERD (gastroesophageal reflux disease)   . Peripheral neuropathy   . Left leg numbness COMPLETE NUMBNESS FROM HIP DOWN TO FOOT  . Numbness in right leg FROM KNEE DOWN TO FOOT  . History of kidney stones AGE 75  . BPH (benign prostatic hypertrophy)   . Choledocholithiasis with chronic cholecystitis     BILE DUCT STONES AND GALLSTONES  . History of gout STABLE PER PT  . Arthritis HANDS  . History of MRSA infection 10 YRS AGO /  SUPERFICIAL SKIN AREA  . Generalized rash RIGHT SHOULDER/ BACK  AREA  . Nocturia   . Frequency of urination   . Hematuria     YRS AGO  . Hypertension   . History of peptic ulcer disease   . CAD (coronary artery disease)     Circumflex stent 1996  //  catheterization January, 2000, 50-60% mid LAD,  dominant circumflex-50% in-stent restenosis similar to a cath in 1999, RCA small and nondominant,, EF 60%  . Complication of anesthesia clostraphobic  . Preop cardiovascular exam     Cardiac clearance for nephrectomy and cholecystectomy June, 2013  . Ejection fraction     EF 60%, catheterization, 2000  //   EF 55-60%, echo, July, 2013  . Anxiety     SINCE MAY 2013 AND DIAGNOSIS OF CANCER-PT WAS HAVING PANIC ATTACKS-FELT LIKE HE COULDN'T BREATHE--STATES HE IS FEELING BETTER ON ALPRAZOLAM  . Cancer     CANCER IN RIGHT KIDNEY-SURGERY PLANNED  . Acute pancreatitis 03/12/2010    Qualifier: Diagnosis of  By: Caryl Never MD, Marteze Vecchio     Past Surgical History  Procedure Date  . Penile prosthesis implant 2008    REMOVED 2010 DUE TO MALFUNCTION  . Esophagogastroduodenoscopy 09/16/2011    Procedure: ESOPHAGOGASTRODUODENOSCOPY (EGD);  Surgeon: Barrie Folk, MD;  Location: [facility];  Service: Endoscopy;  Laterality: N/A;  . Ct perc cholecystostomy 09-17-2011  . Right great toe surg. 2000  . Surg for repair of perferated peptic ulcer AGE 64  . Bilroth  ii procedure AGE 60    2 MONTHS LATER,  BILROTH II RECONSTRUCTION  . Coronary angioplasty with stent placement 1996    STENTING X1 CIRCUMFLEX  . Cardiac catheterization 05-04-1998--- REPORT W/ CHART    50%  IN-STENT RESTENOSIS OF CIRCUMFLEX/ 20% LEFT MAIN/ 50-60% LAD/ RCA NORMAL/ LV NORMAL / EF 60%  . Cataract extraction w/ intraocular lens  implant, bilateral 2005  . Cystoscopy/retrograde/ureteroscopy 10/26/2011    Procedure: CYSTOSCOPY/RETROGRADE/URETEROSCOPY;  Surgeon: Antony Haste, MD;  Location: [facility];  Service: Urology;  Laterality: N/A;  RIGHT URETEROSCOPY AND RIGHT RETROGRADE  PYELOGRAM  DIABETIC C ARM   . Removal of penile prosthesis   . Cholecystectomy 12/20/2011    Procedure: CHOLECYSTECTOMY WITH COMMON DUCT EXPLORATION;  Surgeon: Maisie Fus A. Cornett, MD;  Location: [facility];  Service: General;  Laterality: N/A;  choloductoscopy  . Intraoperative cholangiogram 12/20/2011    Procedure: INTRAOPERATIVE CHOLANGIOGRAM;  Surgeon: Maisie Fus A. Cornett, MD;  Location: [facility];  Service: General;;  . Partial nephrectomy 12/20/2011    Procedure: NEPHRECTOMY PARTIAL;  Surgeon: Antony Haste, MD;  Location: [facility];  Service: Urology;  Laterality: Right;  Right Partial nephrectomy  . Laparotomy 12/24/2011    Procedure: EXPLORATORY LAPAROTOMY;  Surgeon: Clovis Pu. Cornett, MD;  Location: [facility];  Service: General;  Laterality: N/A;  Replacement of T-tube and Bile duct exploration  . Intraoperative cholangiogram 12/24/2011    Procedure: INTRAOPERATIVE CHOLANGIOGRAM;  Surgeon: Maisie Fus A. Cornett, MD;  Location: [facility];  Service: General;  Laterality: N/A;    reports that he quit smoking about 51 years ago. His smoking use included Cigarettes. He has a .5 pack-year smoking history. He has never used smokeless tobacco. He reports that he does not drink alcohol or use illicit drugs. family history includes Colon cancer (age of onset:61) in his sister; Colon cancer (age of onset:74) in his mother; and Colon cancer (age of onset:75) in his brother.  There is no history of Stomach cancer. Allergies  Allergen Reactions  . Codeine Shortness Of Breath    SOB, NERVOUSNESS  . Hydrocodone Shortness Of Breath  . Oxycodone Shortness Of Breath    SOB, NERVOUSNESS  . Tramadol     Unable to take makes pt nervous  . Sulfa Antibiotics Rash      Review of Systems  Constitutional: Positive for appetite change and fatigue. Negative for fever and chills.  HENT: Negative for sore throat and trouble swallowing.   Respiratory: Negative for cough and shortness of breath.   Cardiovascular: Negative  for chest pain, palpitations and leg swelling.  Gastrointestinal: Negative for vomiting, abdominal pain and blood in stool.  Genitourinary: Negative for dysuria.  Neurological: Positive for weakness. Negative for dizziness and syncope.       Objective:   Physical Exam  Constitutional: He appears well-developed and well-nourished.  HENT:  Mouth/Throat: Oropharynx is clear and moist.  Neck: Neck supple.  Cardiovascular: Normal rate and regular rhythm.   Pulmonary/Chest: Effort normal and breath sounds normal. No respiratory distress. He has no wheezes. He has no rales.  Abdominal: Soft.       T-tube draining bile fluid. Abdomen soft. No significant erythema  Musculoskeletal: He exhibits no edema.          Assessment & Plan:  #1 recent surgery for partial right nephrectomy secondary to renal cell carcinoma and open cholecystectomy. Doing relatively well. T-tube in place. Sees surgeon tomorrow. No evidence for infection  #2 type 2 diabetes, insulin-dependent. CBGs relatively stable.  He had to greatly reduce insulin because of decreased oral intake  #3 chronic anxiety. Transition to lorazepam 0.5 mg every 8 hours as needed. Hopefully can taper off soon #4 health maintenance. Flu vaccine given. Pneumovax up to date. #5 poor nutritional status. Recent albumin 2.1. Gave several suggestions for increasing protein intake. Reassess labs including albumin at followup in one month

## 2012-01-21 ENCOUNTER — Encounter (INDEPENDENT_AMBULATORY_CARE_PROVIDER_SITE_OTHER): Payer: Self-pay | Admitting: Surgery

## 2012-01-21 ENCOUNTER — Ambulatory Visit (INDEPENDENT_AMBULATORY_CARE_PROVIDER_SITE_OTHER): Payer: Medicare Other | Admitting: Surgery

## 2012-01-21 VITALS — BP 140/82 | HR 82 | Temp 97.6°F | Resp 18 | Ht 73.0 in | Wt 252.4 lb

## 2012-01-21 DIAGNOSIS — F419 Anxiety disorder, unspecified: Secondary | ICD-10-CM | POA: Insufficient documentation

## 2012-01-21 DIAGNOSIS — G8918 Other acute postprocedural pain: Secondary | ICD-10-CM

## 2012-01-21 MED ORDER — HYDROMORPHONE HCL 2 MG PO TABS: 2.0000 mg | ORAL_TABLET | ORAL | Status: DC | PRN

## 2012-01-21 NOTE — Patient Instructions (Signed)
Return 1 week.

## 2012-01-21 NOTE — Progress Notes (Signed)
Patient returns in followup after T-tube clamping. He did not do well after 2 days and put a C2 back to drainage due to abdominal discomfort. His bowels moving but he has decreased appetite. No fever chills.  Exam: 2 small open areas which incision clean dry no signs of infection. T tube is draining well.  Impression: Status post open cholecystectomy with T-tube for common duct expiration with intermittent right flank pain  Plan: Check T-tube cholangiogram  CT abdomen pelvis  Returned 1 week  Keep T-tube drainage for now

## 2012-01-24 ENCOUNTER — Other Ambulatory Visit (INDEPENDENT_AMBULATORY_CARE_PROVIDER_SITE_OTHER): Payer: Self-pay

## 2012-01-24 DIAGNOSIS — G8918 Other acute postprocedural pain: Secondary | ICD-10-CM

## 2012-01-24 DIAGNOSIS — R109 Unspecified abdominal pain: Secondary | ICD-10-CM

## 2012-01-25 ENCOUNTER — Other Ambulatory Visit (INDEPENDENT_AMBULATORY_CARE_PROVIDER_SITE_OTHER): Payer: Self-pay

## 2012-01-25 DIAGNOSIS — R109 Unspecified abdominal pain: Secondary | ICD-10-CM

## 2012-01-26 ENCOUNTER — Ambulatory Visit (HOSPITAL_COMMUNITY)
Admission: RE | Admit: 2012-01-26 | Discharge: 2012-01-26 | Disposition: A | Discharge status: Home / Self Care | Payer: Medicare Other | Source: Ambulatory Visit | Attending: Surgery | Admitting: Surgery

## 2012-01-26 ENCOUNTER — Other Ambulatory Visit (HOSPITAL_COMMUNITY): Payer: Medicare Other

## 2012-01-26 DIAGNOSIS — R911 Solitary pulmonary nodule: Secondary | ICD-10-CM | POA: Insufficient documentation

## 2012-01-26 DIAGNOSIS — R109 Unspecified abdominal pain: Secondary | ICD-10-CM | POA: Insufficient documentation

## 2012-01-27 ENCOUNTER — Encounter (INDEPENDENT_AMBULATORY_CARE_PROVIDER_SITE_OTHER): Payer: Medicare Other | Admitting: Surgery

## 2012-01-27 ENCOUNTER — Ambulatory Visit (HOSPITAL_COMMUNITY)
Admission: RE | Admit: 2012-01-27 | Discharge: 2012-01-27 | Disposition: A | Discharge status: Home / Self Care | Payer: Medicare Other | Source: Ambulatory Visit | Attending: Surgery | Admitting: Surgery

## 2012-01-27 DIAGNOSIS — Z4682 Encounter for fitting and adjustment of non-vascular catheter: Secondary | ICD-10-CM | POA: Insufficient documentation

## 2012-01-27 DIAGNOSIS — R109 Unspecified abdominal pain: Secondary | ICD-10-CM

## 2012-01-27 MED ORDER — IOHEXOL 300 MG/ML  SOLN
50.0000 mL | Freq: Once | INTRAMUSCULAR | Status: AC | PRN
  Administered 2012-01-27: 15 mL via INTRAVENOUS

## 2012-01-28 ENCOUNTER — Ambulatory Visit (INDEPENDENT_AMBULATORY_CARE_PROVIDER_SITE_OTHER): Payer: Medicare Other | Admitting: Surgery

## 2012-01-28 ENCOUNTER — Encounter (INDEPENDENT_AMBULATORY_CARE_PROVIDER_SITE_OTHER): Payer: Self-pay | Admitting: Surgery

## 2012-01-28 ENCOUNTER — Telehealth (HOSPITAL_COMMUNITY): Payer: Self-pay | Admitting: *Deleted

## 2012-01-28 ENCOUNTER — Other Ambulatory Visit (INDEPENDENT_AMBULATORY_CARE_PROVIDER_SITE_OTHER): Payer: Self-pay

## 2012-01-28 VITALS — BP 130/76 | HR 79 | Temp 98.2°F | Resp 18 | Ht 73.0 in | Wt 254.4 lb

## 2012-01-28 DIAGNOSIS — Z9889 Other specified postprocedural states: Secondary | ICD-10-CM

## 2012-01-28 DIAGNOSIS — C649 Malignant neoplasm of unspecified kidney, except renal pelvis: Secondary | ICD-10-CM

## 2012-01-28 NOTE — Patient Instructions (Signed)
RETURN 3 WEEKS.  PET SCAN AND ONCOLOGY APPOINTMENT.

## 2012-01-28 NOTE — Progress Notes (Signed)
Patient returns in followup after common bile duct exploration, partial right nephrectomy for renal cell carcinoma and open cholecystectomy. T-tube cholangiogram was done this week which shows patency of his common bile duct. He states the technicians at the suture as T-tube has been "loose". Denies any significant abdominal pain. He is not sleeping well. He is tolerating his diet. CT scan was reviewed which shows no fluid collections. He is almost 6 weeks out from surgery. *RADIOLOGY REPORT*  Clinical Data: Abdominal pain  CT ABDOMEN AND PELVIS WITHOUT CONTRAST  Technique: Multidetector CT imaging of the abdomen and pelvis was  performed following the standard protocol without intravenous  contrast.  Comparison: 01/04/2012  Findings: Drain extending into the gallbladder fossa and in the  region of the common bile duct is stable. The additional surgical  drain has been removed. Inflammatory changes in the gallbladder  fossa involving the descending duodenum are not significantly  changed. No evidence of fluid collection. Post cholecystectomy  clips are noted.  The fluid collection and gas in the region of the lower pole of the  right kidney has nearly completely resolved with minimal residual  gas and fluid density.  Unenhanced spleen, right adrenal gland are within normal limits and  stable. Stable left adrenal nodule.  Vague hypodensity in the left lobe of the liver inferiorly in the  lateral segment measures 2.1 x 2.4 cm.  No disproportionate dilatation of bowel to suggest obstruction.  Atrophic pancreas. Punctate calcifications in the pancreatic head  adjacent to the region of the common bile duct are not  significantly changed.  11 mm left lower lobe pulmonary nodule has increased in size  compared with the recent study. This was not present in 2008.  Soft tissue density in the right inguinal ring distally is present  worrisome for undescended testicle.  IMPRESSION:  Stable ET tube  in the region of the common bile duct. No evidence  of fluid collection in the gallbladder fossa bed. Inflammatory  changes in this area persists.  The other surgical drain has been removed.  Stable punctate calcifications in the head of the pancreas.  Improved fluid and gas collection in the partial right lower  nephrectomy bed.  New 11 mm left lower lobe pulmonary nodule which is apparently  enlarging. It was not present in 2008. This is worrisome for  metastatic disease. Full chest CT workup is recommended.  Possible undescended right testicle. Scrotal ultrasound may be  helpful.  Indeterminate liver lesion.  Original Report Authenticated By: Donavan Burnet, M.D.  Exam: T-tube stitch has been removed. He was extremely loose. It removed easily. The T-tube did not appear to be in the common bile duct. Small opening in wounds are clean. He is nonjaundiced.      Impression: #1 common bile duct stone #2 cholelithiasis #3 right renal cell carcinoma status post right partial nephrectomy, common bile duct exploration with T-tube insertion and cholecystectomy with history of Billroth II reconstruction for peptic ulcer disease 30 years ago  Plan: His T-tube was then removed. Referred to medical oncology for pulmonary nodule noted a recent CT scan history renal cell carcinoma. Scheduled PET scan return to clinic 3 weeks.

## 2012-01-31 ENCOUNTER — Telehealth: Payer: Self-pay | Admitting: Oncology

## 2012-01-31 NOTE — Telephone Encounter (Signed)
S/W pt in re NP appt 10/08 @ 1:30 w/ Dr. Clelia Croft Referring- Dr. Luisa Hart Dx- Renal Cell Carcinoma NP packet mailed

## 2012-01-31 NOTE — Telephone Encounter (Signed)
C/D 01/31/12 for appt. 02/08/12

## 2012-02-02 ENCOUNTER — Other Ambulatory Visit: Payer: Self-pay | Admitting: Family Medicine

## 2012-02-03 ENCOUNTER — Inpatient Hospital Stay: Payer: Medicare Other | Admitting: Pulmonary Disease

## 2012-02-04 ENCOUNTER — Other Ambulatory Visit: Payer: Self-pay | Admitting: Oncology

## 2012-02-04 DIAGNOSIS — C649 Malignant neoplasm of unspecified kidney, except renal pelvis: Secondary | ICD-10-CM

## 2012-02-08 ENCOUNTER — Ambulatory Visit (HOSPITAL_BASED_OUTPATIENT_CLINIC_OR_DEPARTMENT_OTHER): Payer: Medicare Other | Admitting: Oncology

## 2012-02-08 ENCOUNTER — Other Ambulatory Visit (HOSPITAL_BASED_OUTPATIENT_CLINIC_OR_DEPARTMENT_OTHER): Payer: Medicare Other | Admitting: Lab

## 2012-02-08 ENCOUNTER — Telehealth: Payer: Self-pay | Admitting: Oncology

## 2012-02-08 ENCOUNTER — Ambulatory Visit: Payer: Medicare Other

## 2012-02-08 ENCOUNTER — Encounter (HOSPITAL_COMMUNITY)
Admission: RE | Admit: 2012-02-08 | Discharge: 2012-02-08 | Disposition: A | Discharge status: Home / Self Care | Payer: Medicare Other | Source: Ambulatory Visit | Attending: Surgery | Admitting: Surgery

## 2012-02-08 VITALS — BP 138/64 | HR 83 | Temp 97.9°F | Resp 18 | Ht 70.5 in | Wt 266.4 lb

## 2012-02-08 DIAGNOSIS — I2510 Atherosclerotic heart disease of native coronary artery without angina pectoris: Secondary | ICD-10-CM | POA: Insufficient documentation

## 2012-02-08 DIAGNOSIS — M129 Arthropathy, unspecified: Secondary | ICD-10-CM | POA: Insufficient documentation

## 2012-02-08 DIAGNOSIS — Z905 Acquired absence of kidney: Secondary | ICD-10-CM | POA: Insufficient documentation

## 2012-02-08 DIAGNOSIS — R911 Solitary pulmonary nodule: Secondary | ICD-10-CM | POA: Insufficient documentation

## 2012-02-08 DIAGNOSIS — C649 Malignant neoplasm of unspecified kidney, except renal pelvis: Secondary | ICD-10-CM

## 2012-02-08 DIAGNOSIS — K449 Diaphragmatic hernia without obstruction or gangrene: Secondary | ICD-10-CM | POA: Insufficient documentation

## 2012-02-08 DIAGNOSIS — E279 Disorder of adrenal gland, unspecified: Secondary | ICD-10-CM | POA: Insufficient documentation

## 2012-02-08 DIAGNOSIS — I517 Cardiomegaly: Secondary | ICD-10-CM | POA: Insufficient documentation

## 2012-02-08 LAB — CBC WITH DIFFERENTIAL/PLATELET
Eosinophils Absolute: 0.2 10*3/uL (ref 0.0–0.5)
HCT: 32 % — ABNORMAL LOW (ref 38.4–49.9)
LYMPH%: 25.2 % (ref 14.0–49.0)
MCHC: 30.9 g/dL — ABNORMAL LOW (ref 32.0–36.0)
MCV: 76.7 fL — ABNORMAL LOW (ref 79.3–98.0)
MONO%: 6.8 % (ref 0.0–14.0)
NEUT#: 4 10*3/uL (ref 1.5–6.5)
NEUT%: 64.3 % (ref 39.0–75.0)
Platelets: 232 10*3/uL (ref 140–400)
RBC: 4.17 10*6/uL — ABNORMAL LOW (ref 4.20–5.82)

## 2012-02-08 LAB — COMPREHENSIVE METABOLIC PANEL (CC13)
Alkaline Phosphatase: 102 U/L (ref 40–150)
BUN: 27 mg/dL — ABNORMAL HIGH (ref 7.0–26.0)
Creatinine: 1.4 mg/dL — ABNORMAL HIGH (ref 0.7–1.3)
Glucose: 80 mg/dl (ref 70–99)
Sodium: 139 mEq/L (ref 136–145)
Total Bilirubin: 0.5 mg/dL (ref 0.20–1.20)
Total Protein: 5.7 g/dL — ABNORMAL LOW (ref 6.4–8.3)

## 2012-02-08 LAB — GLUCOSE, CAPILLARY: Glucose-Capillary: 80 mg/dL (ref 70–99)

## 2012-02-08 MED ORDER — FLUDEOXYGLUCOSE F - 18 (FDG) INJECTION
17.4000 | Freq: Once | INTRAVENOUS | Status: AC | PRN
  Administered 2012-02-08: 17.4 via INTRAVENOUS

## 2012-02-08 NOTE — Progress Notes (Signed)
CC:   Evelena Peat, M.D. Thomas A. Cornett, M.D. Jerilee Field, MD  REASON FOR CONSULTATION:  Kidney cancer and a lung nodule.  HISTORY OF PRESENT ILLNESS:  Mr. Buggy is a pleasant 71 year old gentleman currently of [place], lived the majority of his life around that area.  He currently retired from [employer] and he has a Engineer, drilling. He is a gentleman with a past medical history significant for hypertension as well as diabetes.  He also has a history of chronic renal insufficiency, who had developed a cholecystitis and underwent an open cholecystectomy back in August of 2013.  During his workup he had found to have a kidney mass suspicious for right renal cell carcinoma. The patient has had a history of a Billroth II reconstruction and due to cholecystitis and choledocholithiasis had a percutaneous drain placed in May of 2013.  His operation was as mentioned done in collaboration between urology and general surgery, both Dr. Mena Goes and Dr. Retta Diones, underwent a right partial nephrectomy on 12/20/2011.  The pathology from that showed a stage T1B NX with a maximum size 4.5 cm, Fuhrman grade 3/4.  Surgical margins were involved with the tumor.  The patient recovered reasonably well from his operation, was discharged on 01/05/2012.  The patient had followup imaging study CT scan abdomen and pelvis on 09/25 by Dr. Luisa Hart and then noted that he had a new 11 mm left lower lobe pulmonary nodule which is apparently enlarging although no previous measurements were noted.  For that reason, the patient was referred to me for evaluation.  Clinically he feels relatively fair.  He is actually recovering quite nicely.  He has gained most activities of daily living.  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  He has a nonhealing abdominal wound at this time that however it is progressing quite nicely.  REVIEW OF SYSTEMS:  Did not report any headaches,  blurry vision, double vision.  Did not report any motor or sensory neuropathy.  Did not report any alteration in mental status.  Not reporting any psychiatric issues, depression.  Not reporting any fever, chills, sweats.  Did not report any cough, hemoptysis, hematemesis.  No nausea or vomiting.  No abdominal pain.  No hematochezia, melena, genitourinary complaints. Rest of review of systems is unremarkable.  PAST MEDICAL HISTORY:  Significant for diabetes, hyperlipidemia, renal insufficiency, history of pancreatitis, history of multiple abdominal surgeries.  He also has history of coronary artery disease status post stent placement, had a partial gastrectomy in 1973.  FAMILY HISTORY:  Colon cancer in his mother, sister and brother, ranges between age of 68 and 24.  SOCIAL HISTORY:  He is married.  He has 4 children, 13 grandchildren. He has quit smoking 40 years ago.  Denied any alcohol abuse.  ALLERGIES:  Allergic to hydrocodone and sulfa drugs.  PHYSICAL EXAMINATION:  General:  Alert, awake gentleman appeared in no active distress.  Vital signs:  His blood pressure is 138/64, pulse is 82, respiration 18, weighs 266 pounds.  HEENT:  Head is normocephalic, atraumatic.  Pupils equal, round, reactive to light.  Oral mucosa moist and pink.  Neck:  Supple without adenopathy.  Heart:  Regular rate and rhythm.  S1, S2.  Lungs:  Clear to auscultation.  No rhonchi, wheeze, dullness to percussion.  Abdomen:  Soft, nontender.  No hepatosplenomegaly.  Extremities:  No clubbing, cyanosis or edema. Neurological:  Intact motor and sensory and deep tendon reflexes.  LABORATORY  DATA:  Showed a hemoglobin of 9.9, white cell count of 6.2, platelet count of 232.  His MCV is 767.  ASSESSMENT AND PLAN:  A 71 year old gentleman with the following issues: 1. Recent diagnosis of a stage IB renal cell carcinoma, clear cell     type measuring 4.5 cm with a Fuhrman nuclear grade 3/4 with     positive  surgical margins.  The patient had been operated by Dr.     Mena Goes.  I do agree with his assessment regarding treatment of     his early stage kidney cancer.  At this point I think a radical     nephrectomy could hinder Mr. Wishon kidney function further.  I     think observation and surveillance is certainly the way to go     especially in light of his multiple comorbid conditions and the     fact that he has not really documented any metastatic disease.  If     he should have local recurrence then maybe cryoablation might be     considered at this time. 2. An 11 mm lung nodule as mentioned was incidentally found after     abdominal imaging.  He did have a PET-CT scan that was done earlier     today, which has not been read completely.  The differential     diagnosis discussed today in detail with Mr. Rawles.  I have told     him that this could be of a nonmalignant etiology but appears to     have not been detected in 2008 and may be slightly enlarged.  This     could be due to metastatic kidney cancer, could be a lung primary,     could be metastatic from a different cancer.  I have given him the     option of biopsying it for surgical removal versus doing an     interval scan in 2-3 months.  At this time Mr. Sulkowski is in favor of     having it removed.  I think that is a reasonable option and for     that reason I will refer him to thoracic surgery to evaluate the     risks and benefits of possible thoracic surgery and thoracotomy     hopefully in a noninvasive approach or video assisted thoracotomy.     I have also asked him to follow up with me after the surgery to     discuss the pathology and then we can discuss whether he needs any     systemic therapy.  Obviously if his PET-CT scan showed widespread     disease, then that is a different approach at this time, and then     we can talk about systemic therapy before that but at this time I     think it is unlikely that he has  widely spread metastatic disease.     I think we are dealing with an isolated lung nodule and if that is     indeed the case then surgical resection will be the way to go.  All     his questions were answered today.    ______________________________ Benjiman Core, M.D. FNS/MEDQ  D:  02/08/2012  T:  02/08/2012  Job:  [id number]

## 2012-02-08 NOTE — Progress Notes (Signed)
Note dictated

## 2012-02-08 NOTE — Telephone Encounter (Signed)
appt made and printed for  Pt pt aware that i will call with ref. appt for tcts     aom

## 2012-02-10 ENCOUNTER — Telehealth: Payer: Self-pay | Admitting: Oncology

## 2012-02-10 NOTE — Telephone Encounter (Signed)
s/w pt and he is aware of his surg appt with dr Kathlee Nations trigt     aom

## 2012-02-11 ENCOUNTER — Institutional Professional Consult (permissible substitution) (INDEPENDENT_AMBULATORY_CARE_PROVIDER_SITE_OTHER): Payer: Medicare Other | Admitting: Cardiothoracic Surgery

## 2012-02-11 ENCOUNTER — Encounter: Payer: Self-pay | Admitting: Cardiothoracic Surgery

## 2012-02-11 VITALS — BP 131/72 | HR 72 | Resp 20 | Ht 73.0 in | Wt 254.0 lb

## 2012-02-11 DIAGNOSIS — K838 Other specified diseases of biliary tract: Secondary | ICD-10-CM

## 2012-02-11 DIAGNOSIS — R222 Localized swelling, mass and lump, trunk: Secondary | ICD-10-CM

## 2012-02-11 DIAGNOSIS — R918 Other nonspecific abnormal finding of lung field: Secondary | ICD-10-CM

## 2012-02-11 DIAGNOSIS — C649 Malignant neoplasm of unspecified kidney, except renal pelvis: Secondary | ICD-10-CM

## 2012-02-11 NOTE — Progress Notes (Signed)
PCP is Kristian Covey, MD Referring Provider is Benjiman Core, MD  Chief Complaint  Patient presents with  . Lung Mass    Referral from Dr Clelia Croft for surgical eval on lung mass, PET Scan 02/08/12    HPI: The patient is a 71 year old obese Caucasian male status post partial right nephrectomy with combined cholecystectomy in August. His postoperative course was complicated by a bile leak for which she has had a percutaneous drain for 2 months recently removed. He has a nonhealing right upper quadrant abdominal incision. Patient had a CT scan looking at the biliary system and a small 8 mm nodule was noted in the left lung base medially against the mediastinum. A PET scan was performed and a small nodule had no metabolic activity. The patient has not smoked since he was 71 years old. Review of the preoperative CT scan in may of this year shows the nodule to be present, May 2013.  The appearance of the small nodule on the several CT scans he has had this year appears to be low risk for malignancy. The nodule is too small the biopsy or to resect. I recommended careful surveillance CT scans and we'll repeat the next scan in 5 months. Of note on the PET scan performed recently the left adrenal gland has a 2.1 cm hypermetabolic nodular density suspicious for malignancy.  Past Medical History  Diagnosis Date  . Hyperlipidemia   . Renal mass, right   . Diabetes mellitus type 2, insulin dependent   . Diabetic foot ulcer CURRENT RIGHT GREAT TOE WOUND W/ DRAINAGE --  DRESSING CHANGE EVERY OTHER DAY  . History of acute pancreatitis 2008  . OSA (obstructive sleep apnea) CPAP USED UP UNTIL 2 YRS AGO  WHEN MASK WAS DAMAGED   . Renal insufficiency   . GERD (gastroesophageal reflux disease)   . Peripheral neuropathy   . Left leg numbness COMPLETE NUMBNESS FROM HIP DOWN TO FOOT  . Numbness in right leg FROM KNEE DOWN TO FOOT  . History of kidney stones AGE 69  . BPH (benign prostatic hypertrophy)   .  Choledocholithiasis with chronic cholecystitis     BILE DUCT STONES AND GALLSTONES  . History of gout STABLE PER PT  . Arthritis HANDS  . History of MRSA infection 10 YRS AGO /  SUPERFICIAL SKIN AREA  . Generalized rash RIGHT SHOULDER/ BACK AREA  . Nocturia   . Frequency of urination   . Hematuria     YRS AGO  . Hypertension   . History of peptic ulcer disease   . CAD (coronary artery disease)     Circumflex stent 1996  //  catheterization January, 2000, 50-60% mid LAD,  dominant circumflex-50% in-stent restenosis similar to a cath in 1999, RCA small and nondominant,, EF 60%  . Complication of anesthesia clostraphobic  . Preop cardiovascular exam     Cardiac clearance for nephrectomy and cholecystectomy June, 2013  . Ejection fraction     EF 60%, catheterization, 2000  //   EF 55-60%, echo, July, 2013  . Anxiety     SINCE MAY 2013 AND DIAGNOSIS OF CANCER-PT WAS HAVING PANIC ATTACKS-FELT LIKE HE COULDN'T BREATHE--STATES HE IS FEELING BETTER ON ALPRAZOLAM  . Cancer     CANCER IN RIGHT KIDNEY-SURGERY PLANNED  . Acute pancreatitis 03/12/2010    Qualifier: Diagnosis of  By: Caryl Never MD, Bruce      Past Surgical History  Procedure Date  . Penile prosthesis implant 2008    REMOVED  2010 DUE TO MALFUNCTION  . Esophagogastroduodenoscopy 09/16/2011    Procedure: ESOPHAGOGASTRODUODENOSCOPY (EGD);  Surgeon: Barrie Folk, MD;  Location: [facility];  Service: Endoscopy;  Laterality: N/A;  . Ct perc cholecystostomy 09-17-2011  . Right great toe surg. 2000  . Surg for repair of perferated peptic ulcer AGE 41  . Bilroth ii procedure AGE 21    2 MONTHS LATER,  BILROTH II RECONSTRUCTION  . Coronary angioplasty with stent placement 1996    STENTING X1 CIRCUMFLEX  . Cardiac catheterization 05-04-1998--- REPORT W/ CHART    50%  IN-STENT RESTENOSIS OF CIRCUMFLEX/ 20% LEFT MAIN/ 50-60% LAD/ RCA NORMAL/ LV NORMAL / EF 60%  . Cataract extraction w/ intraocular lens  implant, bilateral 2005  .  Cystoscopy/retrograde/ureteroscopy 10/26/2011    Procedure: CYSTOSCOPY/RETROGRADE/URETEROSCOPY;  Surgeon: Antony Haste, MD;  Location: [facility];  Service: Urology;  Laterality: N/A;  RIGHT URETEROSCOPY AND RIGHT RETROGRADE PYELOGRAM  DIABETIC C ARM   . Removal of penile prosthesis   . Cholecystectomy 12/20/2011    Procedure: CHOLECYSTECTOMY WITH COMMON DUCT EXPLORATION;  Surgeon: Maisie Fus A. Cornett, MD;  Location: [facility];  Service: General;  Laterality: N/A;  choloductoscopy  . Intraoperative cholangiogram 12/20/2011    Procedure: INTRAOPERATIVE CHOLANGIOGRAM;  Surgeon: Maisie Fus A. Cornett, MD;  Location: [facility];  Service: General;;  . Partial nephrectomy 12/20/2011    Procedure: NEPHRECTOMY PARTIAL;  Surgeon: Antony Haste, MD;  Location: [facility];  Service: Urology;  Laterality: Right;  Right Partial nephrectomy  . Laparotomy 12/24/2011    Procedure: EXPLORATORY LAPAROTOMY;  Surgeon: Clovis Pu. Cornett, MD;  Location: [facility];  Service: General;  Laterality: N/A;  Replacement of T-tube and Bile duct exploration  . Intraoperative cholangiogram 12/24/2011    Procedure: INTRAOPERATIVE CHOLANGIOGRAM;  Surgeon: Maisie Fus A. Cornett, MD;  Location: [facility];  Service: General;  Laterality: N/A;    Family History  Problem Relation Age of Onset  . Colon cancer Mother 11  . Colon cancer Sister 62  . Colon cancer Brother 6  . Stomach cancer Neg Hx     Social History History  Substance Use Topics  . Smoking status: Former Smoker -- 0.2 packs/day for 2 years    Types: Cigarettes    Quit date: 02/25/1960  . Smokeless tobacco: Never Used  . Alcohol Use: No    Current Outpatient Prescriptions  Medication Sig Dispense Refill  . aspirin 325 MG EC tablet Take 975 mg by mouth 2 (two) times daily.       . fenofibrate 160 MG tablet TAKE ONE TABLET BY MOUTH EVERY DAY  90 tablet  3  . furosemide (LASIX) 40 MG tablet Take 40 mg by mouth daily as needed. For fluid retention.       . gabapentin (NEURONTIN) 800 MG tablet Take 800 mg by mouth 5 (five) times daily.      [name] HYDROmorphone (DILAUDID) 2 MG tablet Take 1 tablet (2 mg total) by mouth every 4 (four) hours as needed for pain. Take as needed for pain  30 tablet  0  . insulin NPH-insulin regular (NOVOLIN 70/30) (70-30) 100 UNIT/ML injection Inject into the skin See admin instructions. 80 UNITS EVERY AM WITH BREAKFAST  AND 75 UNITS EVERY EVENING WITH SUPPER      . lisinopril (PRINIVIL,ZESTRIL) 10 MG tablet Take 10 mg by mouth daily. TO PROTECT KIDNEYS    TAKES IN AM      . LORazepam (ATIVAN) 0.5 MG tablet Take 1 tablet (0.5 mg total)  by mouth every 8 (eight) hours as needed for anxiety.  90 tablet  3  . omeprazole (PRILOSEC) 20 MG capsule Take 40 mg by mouth 2 (two) times daily.      [name] oxiconazole (OXISTAT) 1 % CREA Apply 1 application topically as needed. For SKIN RASHES      . simvastatin (ZOCOR) 40 MG tablet Take 1 tablet (40 mg total) by mouth at bedtime.  90 tablet  3  . simvastatin (ZOCOR) 40 MG tablet TAKE ONE TABLET BY MOUTH EVERY DAY  90 tablet  3  . sodium chloride irrigation 0.9 % irrigation       . traMADol (ULTRAM) 50 MG tablet Take 50 mg by mouth every 6 (six) hours as needed.       . triamcinolone cream (KENALOG) 0.1 % Apply 1 application topically 2 (two) times daily as needed. For rash on back.        Allergies  Allergen Reactions  . Codeine Shortness Of Breath    SOB, NERVOUSNESS  . Hydrocodone Shortness Of Breath  . Oxycodone Shortness Of Breath    SOB, NERVOUSNESS  . Sulfa Antibiotics Rash    Review of Systems no fever. The patient has lost 25 pounds with his abdominal surgery. He had a coronary stent placed over 12 years ago. No current symptoms of angina.  BP 131/72  Pulse 72  Resp 20  Ht 6\' 1"  (1.854 m)  Wt 254 lb (115.214 kg)  BMI 33.51 kg/m2  SpO2 99% Physical Exam Gen. obese Caucasian male coming by family in no acute distress HEENT normocephalic Neck no JVD mass or  bruit Lymphatics no palpable adenopathy the neck or supralevator fossa Thorax no deformity tenderness breath sounds clear bilaterally Cardiac regular rhythm without murmur or gallop Abdomen long right upper quadrant oblique incision with central area of drainage, fat necrosis. This was packed with a 2 x 2 gauze. Extremities mild lower edema no tenderness Vascular extremities well perfused Neurologic no focal deficit, motor.  Diagnostic Tests: CT scans of abdomen reviewed from may to October of this year. These are all abdominal CT scans that showed the lower lung fields. The small nodular density appears unchanged and has no metabolic activity on current PET scan  Impression: Subcentimeter lung nodule left base, we'll continue to follow with serial CT scans.  Plan: Return in 5 months with CTA chest using IV contrast.

## 2012-02-12 ENCOUNTER — Encounter: Payer: Self-pay | Admitting: Oncology

## 2012-02-15 ENCOUNTER — Telehealth: Payer: Self-pay | Admitting: *Deleted

## 2012-02-15 ENCOUNTER — Other Ambulatory Visit: Payer: Self-pay | Admitting: Oncology

## 2012-02-15 NOTE — Telephone Encounter (Signed)
Patient confirmed over the phone the new date and time on 02-16-2012 1:30pm

## 2012-02-16 ENCOUNTER — Ambulatory Visit (HOSPITAL_BASED_OUTPATIENT_CLINIC_OR_DEPARTMENT_OTHER): Payer: Medicare Other | Admitting: Oncology

## 2012-02-16 ENCOUNTER — Telehealth: Payer: Self-pay | Admitting: Oncology

## 2012-02-16 VITALS — BP 146/68 | HR 92 | Temp 96.9°F | Resp 20 | Ht 73.0 in | Wt 257.0 lb

## 2012-02-16 DIAGNOSIS — E279 Disorder of adrenal gland, unspecified: Secondary | ICD-10-CM

## 2012-02-16 DIAGNOSIS — C649 Malignant neoplasm of unspecified kidney, except renal pelvis: Secondary | ICD-10-CM

## 2012-02-16 DIAGNOSIS — R911 Solitary pulmonary nodule: Secondary | ICD-10-CM

## 2012-02-16 DIAGNOSIS — N2889 Other specified disorders of kidney and ureter: Secondary | ICD-10-CM

## 2012-02-16 NOTE — Telephone Encounter (Signed)
Printed and gv pt appt schedule for Jan and March 2014

## 2012-02-16 NOTE — Progress Notes (Signed)
Hematology and Oncology Follow Up Visit  Frank Ashley [id number] [birth year] 71 y.o. 02/16/2012 1:52 PM   Principle Diagnosis: 71 year old with the following issues: 1. 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 2. Lung nodule: 11 mm noted on the left lower lung. Currently on observation. 3. Left sided adrenal lesion: possible adenoma vs possible mets from renalcancer.   Current therapy: Observation and follow up.   Interim History:  Mr. Frank Ashley presents today for a follow up visit. He is a nice man I saw on 10/8 with the above issue. Since my last visit, he was seen by Dr. Donata Clay for the evaluation of his lung nodule. He recommended follow up at that time. Patient reports feeling well since the last visit.Clinically he feels relatively fair. He is actually recovering quite nicely. He has gained most activities of daily living. 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 He has a nonhealing abdominal wound at this time that however it is progressing quite nicely.    Medications: I have reviewed the patient's current medications. Current outpatient prescriptions:aspirin 325 MG EC tablet, Take 975 mg by mouth 2 (two) times daily. , Disp: , Rfl: ;  fenofibrate 160 MG tablet, TAKE ONE TABLET BY MOUTH EVERY DAY, Disp: 90 tablet, Rfl: 3;  furosemide (LASIX) 40 MG tablet, Take 40 mg by mouth daily as needed. For fluid retention., Disp: , Rfl: ;  gabapentin (NEURONTIN) 800 MG tablet, Take 800 mg by mouth 5 (five) times daily., Disp: , Rfl:  HYDROmorphone (DILAUDID) 2 MG tablet, Take 1 tablet (2 mg total) by mouth every 4 (four) hours as needed for pain. Take as needed for pain, Disp: 30 tablet, Rfl: 0;  insulin NPH-insulin regular (NOVOLIN 70/30) (70-30) 100 UNIT/ML injection, Inject into the skin See admin instructions. 80 UNITS EVERY AM WITH BREAKFAST  AND 75 UNITS EVERY EVENING WITH SUPPER, Disp: , Rfl:  lisinopril  (PRINIVIL,ZESTRIL) 10 MG tablet, Take 10 mg by mouth daily. TO PROTECT KIDNEYS    TAKES IN AM, Disp: , Rfl: ;  LORazepam (ATIVAN) 0.5 MG tablet, Take 1 tablet (0.5 mg total) by mouth every 8 (eight) hours as needed for anxiety., Disp: 90 tablet, Rfl: 3;  omeprazole (PRILOSEC) 20 MG capsule, Take 40 mg by mouth 2 (two) times daily., Disp: , Rfl:  oxiconazole (OXISTAT) 1 % CREA, Apply 1 application topically as needed. For SKIN RASHES, Disp: , Rfl: ;  simvastatin (ZOCOR) 40 MG tablet, Take 1 tablet (40 mg total) by mouth at bedtime., Disp: 90 tablet, Rfl: 3;  simvastatin (ZOCOR) 40 MG tablet, TAKE ONE TABLET BY MOUTH EVERY DAY, Disp: 90 tablet, Rfl: 3;  sodium chloride irrigation 0.9 % irrigation, , Disp: , Rfl:  traMADol (ULTRAM) 50 MG tablet, Take 50 mg by mouth every 6 (six) hours as needed. , Disp: , Rfl:   Allergies:  Allergies  Allergen Reactions  . Codeine Shortness Of Breath    SOB, NERVOUSNESS  . Hydrocodone Shortness Of Breath  . Oxycodone Shortness Of Breath    SOB, NERVOUSNESS  . Sulfa Antibiotics Rash    Past Medical History, Surgical history, Social history, and Family History were reviewed and updated.  Review of Systems: Constitutional:  Negative for fever, chills, night sweats, anorexia, weight loss, pain. Cardiovascular: no chest pain or dyspnea on exertion Respiratory: negative Neurological: negative Dermatological: negative ENT: negative Skin: Negative. Gastrointestinal: negative Genito-Urinary: negative Hematological  and Lymphatic: negative Breast: negative Musculoskeletal: negative Remaining ROS negative. Physical Exam: Blood pressure 146/68, pulse 92, temperature 96.9 F (36.1 C), temperature source Oral, resp. rate 20, height 6\' 1"  (1.854 m), weight 257 lb (116.574 kg). ECOG: 1 General appearance: alert Head: Normocephalic, without obvious abnormality, atraumatic Neck: no adenopathy, no carotid bruit, no JVD, supple, symmetrical, trachea midline and thyroid  not enlarged, symmetric, no tenderness/mass/nodules Lymph nodes: Cervical, supraclavicular, and axillary nodes normal. Heart:regular rate and rhythm, S1, S2 normal, no murmur, click, rub or gallop Lung:chest clear, no wheezing, rales, normal symmetric air entry Abdomin: soft, non-tender, without masses or organomegaly EXT:no erythema, induration, or nodules   Lab Results: Lab Results  Component Value Date   WBC 6.2 02/08/2012   HGB 9.9* 02/08/2012   HCT 32.0* 02/08/2012   MCV 76.7* 02/08/2012   PLT 232 02/08/2012     Chemistry      Component Value Date/Time   NA 139 02/08/2012 1322   NA 132* 01/04/2012 0414   K 4.0 02/08/2012 1322   K 4.5 01/04/2012 0414   CL 110* 02/08/2012 1322   CL 100 01/04/2012 0414   CO2 20* 02/08/2012 1322   CO2 26 01/04/2012 0414   BUN 27.0* 02/08/2012 1322   BUN 14 01/04/2012 0414   CREATININE 1.4* 02/08/2012 1322   CREATININE 1.34 01/04/2012 0414      Component Value Date/Time   CALCIUM 8.3* 02/08/2012 1322   CALCIUM 7.9* 01/04/2012 0414   ALKPHOS 102 02/08/2012 1322   ALKPHOS 87 01/04/2012 0414   AST 18 02/08/2012 1322   AST 13 01/04/2012 0414   ALT 15 02/08/2012 1322   ALT 9 01/04/2012 0414   BILITOT 0.50 02/08/2012 1322   BILITOT 0.3 01/04/2012 0414       Radiological Studies: NUCLEAR MEDICINE PET SKULL BASE TO THIGH  Fasting Blood Glucose: 18  Technique: 17.4 mCi F-18 FDG was injected intravenously. CT data  was obtained and used for attenuation correction and anatomic  localization only. (This was not acquired as a diagnostic CT  examination.) Additional exam technical data entered on  technologist worksheet.  Comparison: CT abdomen pelvis 01/26/2012. No prior PET or  dedicated chest CTs. Plain film chest of 12/22/2011 is reviewed.  Findings:  Neck: No abnormal hypermetabolism.  Chest: No hypermetabolism to correspond to the left lower lobe  lung nodule, which measures 9 mm on image 112. Of note,this is at  the low end of PET resolution and in a suboptimal  location for PET  evaluation (far lung base).  Abdomen/Pelvis: Mild hypermetabolism corresponding to left adrenal  nodularity. This measures 2.1 cm and a S.U.V. max of 2.9 on image  147. The nodularity is new since 02/05/2007 and slightly increased  since 09/14/2011.  Postoperative right upper quadrant subcutaneous hypermetabolism.  Skeleton: Facet arthropathy on the right at L3-L4.Hypermetabolism  in the left medial gluteal musculature. Example image 228. This  measures a S.U.V. max of 4.8 and corresponds to a vague soft tissue  fullness in this area. The soft tissue fullness is new since  12/23/1936.  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  Mild cardiomegaly with coronary artery atherosclerosis. Small  hiatal hernia.  Other abdominal pelvic findings deferred to recent diagnostic CT.  No acute superimposed process identified. The surgical drain in  the right upper quadrant has been removed, but there is no fluid  collection.  IMPRESSION:  1. The left lower lobe lung nodule is not  hypermetabolic/identified. Of note, this is at the low end of PET  resolution. Therefore, given development since 2008 and possible  mild enlargement since May, isolated metastasis or a synchronous  primary bronchogenic carcinoma are ongoing concerns. If not  removed, recommend follow-up chest CT at 3 months.  2. Mildly hypermetabolic left adrenal nodule. This is also new  since 2008 and possibly enlarging since 09/14/2011. Considerations  include metastatic disease versus less likely a lipid poor adenoma.  3. Left gluteal hypermetabolism and subtle soft tissue edema. The  edema appears new since 12/24/2011. Correlate with recent trauma  or infectious symptoms in this area. Early intramuscular metastasis  is felt less likely.    Impression and Plan:  71 year old gentleman with the following issues:  1. Recent diagnosis of a stage IB  renal cell carcinoma, clear cell type measuring 4.5 cm with a Fuhrman nuclear grade 3/4 with positive surgical margins. The patient had been operated by Dr. Mena Goes. I do agree with his assessment regarding treatment of this early stage kidney cancer. At this point I think a radical  nephrectomy could hinder Mr. Howdeshell kidney function further. If he should have local recurrence then maybe cryoablation might be  considered at this time.   2. An 11 mm lung nodule as mentioned was incidentally found after abdominal imaging. He was seen by Dr. Donata Clay and felt a repeat CT scan in 5 months is the was to go and certainly agree with that.   3. 2.1 cm left adrenal lesion. The scans reviewed today (PET/CT and CT from 09/2011) with radiology and the patient today. The size of this lesion is stable over 5 month, the enhancement pattern is consistent with an denoma. However, given the mild SUV uptake, it is important to make sure it is not cancer met.  I discussed the option of surgical resection vs serial observation with imaging studies. He is agreeable to repeat imaging studies in 5 month with the chest imaging.   SHADAD,FIRAS, MD 10/16/20131:52 PM

## 2012-02-18 ENCOUNTER — Telehealth (INDEPENDENT_AMBULATORY_CARE_PROVIDER_SITE_OTHER): Payer: Self-pay

## 2012-02-18 NOTE — Telephone Encounter (Signed)
Patient called in with concerns of open wound that has not healed. He states his oncology doctor looked at him and suggested he start packing the area to help it heal. Patient called asking if he should start this packing now or wait until Mondays appointment. I asked patient if he knew how to do packing or wet to dry dressing changes and he said that he did not. I offered patient to come in for a nurse only visit today to have area looked at to see what kind of wound care patient possibly needs but he said he would just wait to see Dr. Luisa Hart on Monday. I told patient to keep clean dry guaze over area until his appointment.

## 2012-02-21 ENCOUNTER — Encounter (INDEPENDENT_AMBULATORY_CARE_PROVIDER_SITE_OTHER): Payer: Self-pay | Admitting: Surgery

## 2012-02-21 ENCOUNTER — Ambulatory Visit (INDEPENDENT_AMBULATORY_CARE_PROVIDER_SITE_OTHER): Payer: Medicare Other | Admitting: Surgery

## 2012-02-21 VITALS — BP 136/78 | HR 76 | Temp 98.2°F | Resp 16 | Ht 73.0 in | Wt 260.0 lb

## 2012-02-21 DIAGNOSIS — Z9889 Other specified postprocedural states: Secondary | ICD-10-CM

## 2012-02-21 NOTE — Patient Instructions (Signed)
Apply neosporin to wound twice a day.  Cover with dry dressing.  Return 3 weeks

## 2012-02-21 NOTE — Progress Notes (Signed)
Patient returns in followup. He is seen cardiothoracic surgery and they have opted to follow spot on his lung. He does have an adrenal mass the left this was felt to be something to follow which I agree. Both areas have been present before and the changes were minimal and packed did not conclusively show these to be consistent with malignancy. He has multiple comorbidities as well an operative risk for him is significant. He is doing well. He still has drainage from his wound.  Exam: Incision exam and there is a 3 x 2 cm open wound with no signs of infection. It is 1 cm deep. Neosporin applied and dry dressing.  Impression: Status post open cholecystectomy and common bile duct expiration and partial nephrectomy for right renal cell carcinoma with a pulmonary mass measuring 1 cm and left adrenal mass measuring 2 cm noted on PET and CT which are relatively stable. Surgical excision versus observation were offered the patient and he wishes to follow these radiographically. Return in 3 weeks for wound check. He'll need a followup CT scan in 3-6 months.

## 2012-02-23 ENCOUNTER — Ambulatory Visit (INDEPENDENT_AMBULATORY_CARE_PROVIDER_SITE_OTHER): Payer: Medicare Other | Admitting: Family Medicine

## 2012-02-23 ENCOUNTER — Encounter: Payer: Self-pay | Admitting: Family Medicine

## 2012-02-23 VITALS — BP 142/72 | Temp 97.9°F | Wt 262.0 lb

## 2012-02-23 DIAGNOSIS — Z23 Encounter for immunization: Secondary | ICD-10-CM

## 2012-02-23 DIAGNOSIS — Z794 Long term (current) use of insulin: Secondary | ICD-10-CM

## 2012-02-23 DIAGNOSIS — E119 Type 2 diabetes mellitus without complications: Secondary | ICD-10-CM

## 2012-02-23 DIAGNOSIS — Z299 Encounter for prophylactic measures, unspecified: Secondary | ICD-10-CM

## 2012-02-23 MED ORDER — ZOSTER VACCINE LIVE 19400 UNT/0.65ML ~~LOC~~ SOLR: 0.6500 mL | Freq: Once | SUBCUTANEOUS | Status: DC

## 2012-02-23 NOTE — Progress Notes (Signed)
  Subjective:    Patient ID: Frank Ashley, male    DOB: [birth year], 71 y.o.   MRN: [id number]  HPI  Patient seen for followup. Recent right nephrectomy for renal cell carcinoma. Patient also had cholecystectomy. Incidental note of left lower lung nodule and adrenal mass. Recent PET scan inconclusive. Plans are to followup with repeat x-rays in a few months. Followed by oncology as well as surgery. He had some mild weight loss which he attributes to his efforts. Good appetite. Recent albumin increasing at 3.1. Recent hemoglobin 9.9. Overall feels well  Diabetes has been stable. In fact he has recently been holding frequently his morning dose of insulin. He's had one episode where his blood sugar dropped to 35.   Patient requesting consideration for shingles vaccine. No contraindications.  has already had flu vaccine   Review of Systems  Constitutional: Negative for fatigue.  Eyes: Negative for visual disturbance.  Respiratory: Negative for cough, chest tightness and shortness of breath.   Cardiovascular: Negative for chest pain, palpitations and leg swelling.  Neurological: Negative for dizziness, syncope, weakness, light-headedness and headaches.       Objective:   Physical Exam  Constitutional: He appears well-developed and well-nourished.  Neck: Neck supple. No thyromegaly present.  Cardiovascular: Normal rate and regular rhythm.   Pulmonary/Chest: Effort normal and breath sounds normal. No respiratory distress. He has no wheezes. He has no rales.  Musculoskeletal: He exhibits no edema.          Assessment & Plan:  #1Type 2 diabetes. Recheck A1c. Continue close monitoring.  #2 hypertension. Marginal control. Continue weight loss efforts and close monitoring  #3 health maintenance. Shingles vaccine given. No contraindications

## 2012-02-24 NOTE — Progress Notes (Signed)
Quick Note:  Pt informed ______ 

## 2012-02-29 ENCOUNTER — Ambulatory Visit (INDEPENDENT_AMBULATORY_CARE_PROVIDER_SITE_OTHER): Payer: Medicare Other | Admitting: Surgery

## 2012-02-29 ENCOUNTER — Encounter (INDEPENDENT_AMBULATORY_CARE_PROVIDER_SITE_OTHER): Payer: Self-pay | Admitting: Surgery

## 2012-02-29 VITALS — BP 142/58 | HR 80 | Temp 97.0°F | Resp 24 | Ht 73.0 in | Wt 259.8 lb

## 2012-02-29 DIAGNOSIS — R109 Unspecified abdominal pain: Secondary | ICD-10-CM

## 2012-02-29 NOTE — Progress Notes (Signed)
Patient returns to clinic today due to discomfort along his right costal margin. He is 2 months out from an open cholecystectomy and common duct expiration. He is a chronic open wound there that is slowly healing. He developed more swelling just along the right costal margin above the incision 3 days ago. He also is more discomfort than before there. No obvious fever or chills. No nausea vomiting or history of jaundice. No recent cough.  Exam: Right upper quadrant costal incision healed except for the central portion which is open 3 x 2 cm this is clean. There is no redness. There is no fluctuance it is tender right along the inferior aspect of his right costal margin to palpation.  Impression: Right upper quadrant pain along right costal margin  Plan: Given his complicated history I feel CT of the abdomen pelvis  without contrast.I see no signs of infection cannot exclude intra-abdominal cause. Return to clinic after.

## 2012-02-29 NOTE — Patient Instructions (Signed)
WILL SCHEDULE ct TO EVALUATE PAIN.  KEEP APPT SCHEDULED.

## 2012-03-01 ENCOUNTER — Telehealth: Payer: Self-pay | Admitting: Family Medicine

## 2012-03-01 MED ORDER — AMOXICILLIN-POT CLAVULANATE 875-125 MG PO TABS: 1.0000 | ORAL_TABLET | Freq: Two times a day (BID) | ORAL | Status: AC

## 2012-03-01 NOTE — Telephone Encounter (Signed)
Please advise 

## 2012-03-01 NOTE — Telephone Encounter (Signed)
May refill once but needs to be seen if draining.

## 2012-03-01 NOTE — Telephone Encounter (Signed)
Pt called req to get refill of Augmentin for diabetic ulcer on pts rt great toe. [facility].

## 2012-03-01 NOTE — Telephone Encounter (Signed)
Pt reports toe is red, no draining noted.

## 2012-03-02 ENCOUNTER — Other Ambulatory Visit: Payer: Medicare Other

## 2012-03-07 ENCOUNTER — Ambulatory Visit
Admission: RE | Admit: 2012-03-07 | Discharge: 2012-03-07 | Disposition: A | Discharge status: Home / Self Care | Payer: Medicare Other | Source: Ambulatory Visit | Attending: Surgery | Admitting: Surgery

## 2012-03-07 ENCOUNTER — Ambulatory Visit: Payer: Medicare Other | Admitting: Oncology

## 2012-03-07 DIAGNOSIS — R109 Unspecified abdominal pain: Secondary | ICD-10-CM

## 2012-03-13 ENCOUNTER — Encounter (INDEPENDENT_AMBULATORY_CARE_PROVIDER_SITE_OTHER): Payer: Self-pay | Admitting: Surgery

## 2012-03-13 ENCOUNTER — Ambulatory Visit (INDEPENDENT_AMBULATORY_CARE_PROVIDER_SITE_OTHER): Payer: Medicare Other | Admitting: Surgery

## 2012-03-13 VITALS — BP 150/84 | HR 100 | Temp 97.1°F | Ht 73.0 in | Wt 261.2 lb

## 2012-03-13 DIAGNOSIS — Z9889 Other specified postprocedural states: Secondary | ICD-10-CM

## 2012-03-13 MED ORDER — TRAMADOL HCL 50 MG PO TABS: 50.0000 mg | ORAL_TABLET | Freq: Four times a day (QID) | ORAL | Status: DC | PRN

## 2012-03-13 NOTE — Patient Instructions (Signed)
Continue wound care.  Return 1 month. 

## 2012-03-13 NOTE — Progress Notes (Signed)
Patient is doing well. He returns today for wound check. His CT scan showed no complicating feature.  Exam: Open. This wound measures 3 x 2 cm. It is clean. No signs of hernia. No signs of abscess.  *RADIOLOGY REPORT*  Clinical Data: Right lateral abdominal pain. History of renal cell  cancer. Lung nodule and adrenal nodule follow-up.  CT ABDOMEN AND PELVIS WITHOUT CONTRAST  Technique: Multidetector CT imaging of the abdomen and pelvis was  performed following the standard protocol without intravenous  contrast.  Comparison: 01/26/2012  Findings: Well circumscribed rounded nodule is noted in the medial  left lung base on image 15. This measures 8 mm and is unchanged.  Tiny subpleural small nodule in the left lower lobe on image 12  laterally is stable. No new pulmonary nodules in the lung bases.  No pleural effusions.  Left adrenal nodule is again noted, measuring 2.0 cm. This  measures 41 HU and therefore is not palpable for diagnostic of an  adenoma.  Prior cholecystectomy. Postsurgical changes are noted in the right  upper quadrant. Previously seen drainage catheter has been  removed. The gas in soft tissue area adjacent to the lower pole of  the right kidney has resolved. Mild bilateral perinephric  stranding. No hydronephrosis.  Small calcifications in the liver and spleen compatible with old  granulomatous disease. Pancreas is atrophic, grossly unremarkable  otherwise. Small bowel is decompressed. Postoperative changes in  the region of the distal stomach. Colon grossly unremarkable as is  the urinary bladder and prostate. No free fluid, free air or  adenopathy.  IMPRESSION:  Stable scattered bilateral basilar nodules.  Stable left adrenal nodule.  Stable postoperative changes in the abdomen as above.  Original Report Authenticated By: Charlett Nose, M.D.  Assessment: Status post open cholecystectomy and common bile duct exploration  Partial right nephrectomy for renal cell  carcinoma  History pulmonary nodule stable  Adrenal adenoma stable  Plan: Return in one month. Refill Ultram. Continue wound care.

## 2012-03-17 ENCOUNTER — Encounter: Payer: Self-pay | Admitting: Oncology

## 2012-03-27 ENCOUNTER — Encounter: Payer: Self-pay | Admitting: Family Medicine

## 2012-03-27 ENCOUNTER — Ambulatory Visit (INDEPENDENT_AMBULATORY_CARE_PROVIDER_SITE_OTHER): Payer: Medicare Other | Admitting: Family Medicine

## 2012-03-27 VITALS — BP 120/60 | Temp 98.6°F | Wt 254.0 lb

## 2012-03-27 DIAGNOSIS — L299 Pruritus, unspecified: Secondary | ICD-10-CM

## 2012-03-27 DIAGNOSIS — R197 Diarrhea, unspecified: Secondary | ICD-10-CM

## 2012-03-27 MED ORDER — CHOLESTYRAMINE 4 G PO PACK: PACK | ORAL | Status: DC

## 2012-03-27 MED ORDER — HYDROXYZINE HCL 50 MG PO TABS: 50.0000 mg | ORAL_TABLET | Freq: Three times a day (TID) | ORAL | Status: DC | PRN

## 2012-03-27 NOTE — Progress Notes (Signed)
Subjective:     Patient ID: Frank Ashley, male   DOB: Sep 25, 1940, 71 y.o.   MRN: [id number]  HPI  71 year old recently s/p R partial nephrectomy and cholecystectomy here for evaluation of pruritis.  Pt states that symptoms began about 6 weeks ago, and have worsened over the past 3 weeks.  He describes that he itches all over, and that it feels like it is inside.  He denies any rash, hives, or specific places that itch more than any others.  Has had diarrhea since surgery, and notices that it is light in color as well.  Has tried Benadryl up to 6 tabs/24 hrs without relief.  Has tried lotion for dry skin, but states that this actually makes itching worse.  Denies fever, nausea or vomiting.  Review of Systems  Constitutional: Negative for fever.       Pruritis x6 weeks.  Gastrointestinal: Positive for diarrhea. Negative for nausea and vomiting.       Objective:   Physical Exam  Constitutional: He appears well-developed and well-nourished.  Skin: Skin is warm and dry. No rash noted. No erythema.       Several places on UE and back where he has scratched and broken skin.  No rash or urticaria noted.       Assessment:     71 year old here for evaluation of 6 weeks of pruritis.    Plan:     1. Pruritis: Nonspecific.  No rash or urticaria to suggest allergic reaction.  Pt did have recent cholecystectomy, but no evidence of acute liver pathology--no jaundice, vomiting.  Could be bile acid deposition.  Rx Questran 4G, start with one packet per day with meal and can titrate up depending on TG levels.  Pt's TG levels controlled on recent lipid panel, but low threshold to re-check, given his history of elevation.  This also should help resolve pt's diarrhea.  Also Rx hydroxyzine 50mg  TID for pruritis.  Do not take any more Benadryl.  Follow up again in 1 week.  Marthann Schiller     Agree with assessment and plan as per Marthann Schiller, MS 3 He does not have findings to suggest significant  intrahepatic cholestasis such as jaundice, scleral icterus, etc.  We are going to try to use the Questran for his daily diarrhea/loose stools following the cholecystectomy. Atarax for the itching and labs (hepatic, TSH, BMP) if symptoms persist.  Evelena Peat MD

## 2012-04-03 ENCOUNTER — Ambulatory Visit (INDEPENDENT_AMBULATORY_CARE_PROVIDER_SITE_OTHER): Payer: Medicare Other | Admitting: Family Medicine

## 2012-04-03 ENCOUNTER — Telehealth: Payer: Self-pay | Admitting: Family Medicine

## 2012-04-03 ENCOUNTER — Encounter: Payer: Self-pay | Admitting: Family Medicine

## 2012-04-03 VITALS — BP 120/80 | Temp 98.1°F | Wt 255.0 lb

## 2012-04-03 DIAGNOSIS — R197 Diarrhea, unspecified: Secondary | ICD-10-CM

## 2012-04-03 DIAGNOSIS — L282 Other prurigo: Secondary | ICD-10-CM

## 2012-04-03 DIAGNOSIS — Z794 Long term (current) use of insulin: Secondary | ICD-10-CM

## 2012-04-03 DIAGNOSIS — E119 Type 2 diabetes mellitus without complications: Secondary | ICD-10-CM

## 2012-04-03 LAB — BASIC METABOLIC PANEL
Calcium: 8.9 mg/dL (ref 8.4–10.5)
GFR: 41.63 mL/min — ABNORMAL LOW (ref >60.00)
Potassium: 4 mEq/L (ref 3.5–5.1)
Sodium: 141 mEq/L (ref 135–145)

## 2012-04-03 LAB — HEPATIC FUNCTION PANEL
ALT: 15 U/L (ref 0–53)
AST: 17 U/L (ref 0–37)
Albumin: 3.7 g/dL (ref 3.5–5.2)
Total Bilirubin: 0.6 mg/dL (ref 0.3–1.2)
Total Protein: 6.6 g/dL (ref 6.0–8.3)

## 2012-04-03 MED ORDER — TRIAMCINOLONE ACETONIDE 0.1 % EX CREA: TOPICAL_CREAM | Freq: Two times a day (BID) | CUTANEOUS | Status: AC

## 2012-04-03 MED ORDER — INSULIN NPH ISOPHANE & REGULAR (70-30) 100 UNIT/ML ~~LOC~~ SUSP: SUBCUTANEOUS | Status: DC

## 2012-04-03 MED ORDER — HYDROXYZINE HCL 50 MG PO TABS: 50.0000 mg | ORAL_TABLET | Freq: Three times a day (TID) | ORAL | Status: DC | PRN

## 2012-04-03 MED ORDER — TRIAMCINOLONE ACETONIDE 0.1 % EX CREA: TOPICAL_CREAM | Freq: Two times a day (BID) | CUTANEOUS | Status: DC

## 2012-04-03 NOTE — Progress Notes (Signed)
Subjective:    Patient ID: Frank Ashley, male    DOB: 1941/04/13, 71 y.o.   MRN: [id number]  HPI  Patient seen for followup regarding pruritus and postcholecystectomy diarrhea. Refer to prior note. Started cholestyramine. He has noted improvement in his diarrhea symptoms which have essentially resolved. He has not noted much improvement with itching. This is mostly related to trunk region. Atarax does help especially at night. He has not had any evidence for obvious cholestasis such as jaundice. No nausea or vomiting. Diabetes well controlled. Recent A1c 6.6%. No recent new medications otherwise.  Patient has change of insurance regarding drug plan and will need to be changed to Humulin 70/30-currently on Novolin 70/30.  Past Medical History  Diagnosis Date  . Hyperlipidemia   . Renal mass, right   . Diabetes mellitus type 2, insulin dependent   . Diabetic foot ulcer CURRENT RIGHT GREAT TOE WOUND W/ DRAINAGE --  DRESSING CHANGE EVERY OTHER DAY  . History of acute pancreatitis 2008  . OSA (obstructive sleep apnea) CPAP USED UP UNTIL 2 YRS AGO  WHEN MASK WAS DAMAGED   . Renal insufficiency   . GERD (gastroesophageal reflux disease)   . Peripheral neuropathy   . Left leg numbness COMPLETE NUMBNESS FROM HIP DOWN TO FOOT  . Numbness in right leg FROM KNEE DOWN TO FOOT  . History of kidney stones AGE 56  . BPH (benign prostatic hypertrophy)   . Choledocholithiasis with chronic cholecystitis     BILE DUCT STONES AND GALLSTONES  . History of gout STABLE PER PT  . Arthritis HANDS  . History of MRSA infection 10 YRS AGO /  SUPERFICIAL SKIN AREA  . Generalized rash RIGHT SHOULDER/ BACK AREA  . Nocturia   . Frequency of urination   . Hematuria     YRS AGO  . Hypertension   . History of peptic ulcer disease   . CAD (coronary artery disease)     Circumflex stent 1996  //  catheterization January, 2000, 50-60% mid LAD,  dominant circumflex-50% in-stent restenosis similar to a cath in 1999,  RCA small and nondominant,, EF 60%  . Complication of anesthesia clostraphobic  . Preop cardiovascular exam     Cardiac clearance for nephrectomy and cholecystectomy June, 2013  . Ejection fraction     EF 60%, catheterization, 2000  //   EF 55-60%, echo, July, 2013  . Anxiety     SINCE MAY 2013 AND DIAGNOSIS OF CANCER-PT WAS HAVING PANIC ATTACKS-FELT LIKE HE COULDN'T BREATHE--STATES HE IS FEELING BETTER ON ALPRAZOLAM  . Cancer     CANCER IN RIGHT KIDNEY-SURGERY PLANNED  . Acute pancreatitis 03/12/2010    Qualifier: Diagnosis of  By: Caryl Never MD, Aurthur Wingerter     Past Surgical History  Procedure Date  . Penile prosthesis implant 2008    REMOVED 2010 DUE TO MALFUNCTION  . Esophagogastroduodenoscopy 09/16/2011    Procedure: ESOPHAGOGASTRODUODENOSCOPY (EGD);  Surgeon: Barrie Folk, MD;  Location: [facility];  Service: Endoscopy;  Laterality: N/A;  . Ct perc cholecystostomy 09-17-2011  . Right great toe surg. 2000  . Surg for repair of perferated peptic ulcer AGE 48  . Bilroth ii procedure AGE 66    2 MONTHS LATER,  BILROTH II RECONSTRUCTION  . Coronary angioplasty with stent placement 1996    STENTING X1 CIRCUMFLEX  . Cardiac catheterization 05-04-1998--- REPORT W/ CHART    50%  IN-STENT RESTENOSIS OF CIRCUMFLEX/ 20% LEFT MAIN/ 50-60% LAD/ RCA NORMAL/ LV NORMAL / EF 60%  .  Cataract extraction w/ intraocular lens  implant, bilateral 2005  . Cystoscopy/retrograde/ureteroscopy 10/26/2011    Procedure: CYSTOSCOPY/RETROGRADE/URETEROSCOPY;  Surgeon: Antony Haste, MD;  Location: [facility];  Service: Urology;  Laterality: N/A;  RIGHT URETEROSCOPY AND RIGHT RETROGRADE PYELOGRAM  DIABETIC C ARM   . Removal of penile prosthesis   . Cholecystectomy 12/20/2011    Procedure: CHOLECYSTECTOMY WITH COMMON DUCT EXPLORATION;  Surgeon: Maisie Fus A. Cornett, MD;  Location: [facility];  Service: General;  Laterality: N/A;  choloductoscopy  . Intraoperative cholangiogram 12/20/2011     Procedure: INTRAOPERATIVE CHOLANGIOGRAM;  Surgeon: Maisie Fus A. Cornett, MD;  Location: [facility];  Service: General;;  . Partial nephrectomy 12/20/2011    Procedure: NEPHRECTOMY PARTIAL;  Surgeon: Antony Haste, MD;  Location: [facility];  Service: Urology;  Laterality: Right;  Right Partial nephrectomy  . Laparotomy 12/24/2011    Procedure: EXPLORATORY LAPAROTOMY;  Surgeon: Clovis Pu. Cornett, MD;  Location: [facility];  Service: General;  Laterality: N/A;  Replacement of T-tube and Bile duct exploration  . Intraoperative cholangiogram 12/24/2011    Procedure: INTRAOPERATIVE CHOLANGIOGRAM;  Surgeon: Maisie Fus A. Cornett, MD;  Location: [facility];  Service: General;  Laterality: N/A;    reports that he quit smoking about 52 years ago. His smoking use included Cigarettes. He has a .5 pack-year smoking history. He has never used smokeless tobacco. He reports that he does not drink alcohol or use illicit drugs. family history includes Colon cancer (age of onset:61) in his sister; Colon cancer (age of onset:74) in his mother; and Colon cancer (age of onset:75) in his brother.  There is no history of Stomach cancer. Allergies  Allergen Reactions  . Codeine Shortness Of Breath    SOB, NERVOUSNESS  . Hydrocodone Shortness Of Breath  . Oxycodone Shortness Of Breath    SOB, NERVOUSNESS  . Sulfa Antibiotics Rash     Review of Systems  Constitutional: Negative for fever and chills.  Respiratory: Negative for shortness of breath.   Cardiovascular: Negative for chest pain.  Gastrointestinal: Negative for nausea, vomiting and abdominal pain.  Genitourinary: Negative for dysuria.  Hematological: Negative for adenopathy.       Objective:   Physical Exam  Constitutional: He is oriented to person, place, and time. He appears well-developed and well-nourished.  Cardiovascular: Normal rate and regular rhythm.   Pulmonary/Chest: Effort normal and breath sounds normal. No respiratory distress. He has no wheezes. He  has no rales.  Musculoskeletal: He exhibits no edema.       Callus left great toe with no ulcer.  Neurological: He is alert and oriented to person, place, and time.  Skin: Skin is dry. No erythema.       Few excoriations on trunk.  No visible rash.  Generally dry skin.          Assessment & Plan:  Pruritis.  Doubt intrahepatic cholestasis.  ?related to dryness.  Check labs with TSH, BMP, and hepatic.  Type 2 diabetes.  Change insulin to Humulin 70/30 for cost issues. Continue with atarax and triamcinolone 1:1 with eucerin.  Diarrhea post cholecystectomy.  Improved on cholestyramine.

## 2012-04-03 NOTE — Patient Instructions (Addendum)
Apply medicated cream/lotion to body twice daily and especially after bathing.

## 2012-04-03 NOTE — Telephone Encounter (Signed)
Pharmacy needs clarification on Eucerin and triamcinolone cream (KENALOG) 0.1 %

## 2012-04-03 NOTE — Telephone Encounter (Signed)
Question answered. 

## 2012-04-04 ENCOUNTER — Other Ambulatory Visit: Payer: Self-pay | Admitting: *Deleted

## 2012-04-04 MED ORDER — INSULIN NPH ISOPHANE & REGULAR (70-30) 100 UNIT/ML ~~LOC~~ SUSP: SUBCUTANEOUS | Status: DC

## 2012-04-04 MED ORDER — HYDROXYZINE HCL 50 MG PO TABS: 50.0000 mg | ORAL_TABLET | Freq: Three times a day (TID) | ORAL | Status: DC | PRN

## 2012-04-07 ENCOUNTER — Ambulatory Visit (INDEPENDENT_AMBULATORY_CARE_PROVIDER_SITE_OTHER): Payer: Medicare Other | Admitting: Surgery

## 2012-04-07 ENCOUNTER — Encounter (INDEPENDENT_AMBULATORY_CARE_PROVIDER_SITE_OTHER): Payer: Self-pay | Admitting: Surgery

## 2012-04-07 VITALS — BP 130/64 | HR 72 | Temp 97.3°F | Resp 16 | Ht 73.0 in | Wt 254.5 lb

## 2012-04-07 DIAGNOSIS — S31109A Unspecified open wound of abdominal wall, unspecified quadrant without penetration into peritoneal cavity, initial encounter: Secondary | ICD-10-CM

## 2012-04-07 NOTE — Progress Notes (Signed)
Patient is doing well. He returns today for wound check. His CT scan showed no complicating feature.  Exam: Open. This wound measures 1 cm. It is clean. No signs of hernia. No signs of abscess.  *RADIOLOGY REPORT*  Clinical Data: Right lateral abdominal pain. History of renal cell  cancer. Lung nodule and adrenal nodule follow-up.  CT ABDOMEN AND PELVIS WITHOUT CONTRAST  Technique: Multidetector CT imaging of the abdomen and pelvis was  performed following the standard protocol without intravenous  contrast.  Comparison: 01/26/2012  Findings: Well circumscribed rounded nodule is noted in the medial  left lung base on image 15. This measures 8 mm and is unchanged.  Tiny subpleural small nodule in the left lower lobe on image 12  laterally is stable. No new pulmonary nodules in the lung bases.  No pleural effusions.  Left adrenal nodule is again noted, measuring 2.0 cm. This  measures 41 HU and therefore is not palpable for diagnostic of an  adenoma.  Prior cholecystectomy. Postsurgical changes are noted in the right  upper quadrant. Previously seen drainage catheter has been  removed. The gas in soft tissue area adjacent to the lower pole of  the right kidney has resolved. Mild bilateral perinephric  stranding. No hydronephrosis.  Small calcifications in the liver and spleen compatible with old  granulomatous disease. Pancreas is atrophic, grossly unremarkable  otherwise. Small bowel is decompressed. Postoperative changes in  the region of the distal stomach. Colon grossly unremarkable as is  the urinary bladder and prostate. No free fluid, free air or  adenopathy.  IMPRESSION:  Stable scattered bilateral basilar nodules.  Stable left adrenal nodule.  Stable postoperative changes in the abdomen as above.  Original Report Authenticated By: Charlett Nose, M.D.  Assessment: Status post open cholecystectomy and common bile duct exploration  Partial right nephrectomy for renal cell  carcinoma  History pulmonary nodule stable  Adrenal adenoma stable  Plan: Return in 6 month.  Continue wound care.

## 2012-04-07 NOTE — Patient Instructions (Signed)
Return 3 months

## 2012-05-02 ENCOUNTER — Telehealth: Payer: Self-pay | Admitting: Family Medicine

## 2012-05-02 MED ORDER — INSULIN NPH ISOPHANE & REGULAR (70-30) 100 UNIT/ML ~~LOC~~ SUSP: SUBCUTANEOUS | Status: DC

## 2012-05-02 NOTE — Telephone Encounter (Signed)
Pharmacy called and we need to change amount ordered of insulin. I spoke with pharmacist and gave the new order. ( 90 day supply )

## 2012-05-19 ENCOUNTER — Telehealth: Payer: Self-pay | Admitting: Oncology

## 2012-05-19 ENCOUNTER — Other Ambulatory Visit (HOSPITAL_BASED_OUTPATIENT_CLINIC_OR_DEPARTMENT_OTHER): Payer: Medicare Other | Admitting: Lab

## 2012-05-19 ENCOUNTER — Ambulatory Visit (HOSPITAL_BASED_OUTPATIENT_CLINIC_OR_DEPARTMENT_OTHER): Payer: Medicare Other | Admitting: Oncology

## 2012-05-19 VITALS — BP 128/68 | HR 79 | Temp 96.8°F | Resp 20 | Ht 73.0 in | Wt 263.8 lb

## 2012-05-19 DIAGNOSIS — C649 Malignant neoplasm of unspecified kidney, except renal pelvis: Secondary | ICD-10-CM

## 2012-05-19 DIAGNOSIS — E279 Disorder of adrenal gland, unspecified: Secondary | ICD-10-CM

## 2012-05-19 DIAGNOSIS — R911 Solitary pulmonary nodule: Secondary | ICD-10-CM

## 2012-05-19 DIAGNOSIS — N2889 Other specified disorders of kidney and ureter: Secondary | ICD-10-CM

## 2012-05-19 LAB — CBC WITH DIFFERENTIAL/PLATELET
Basophils Absolute: 0 10*3/uL (ref 0.0–0.1)
EOS%: 4.4 % (ref 0.0–7.0)
HCT: 36.6 % — ABNORMAL LOW (ref 38.4–49.9)
HGB: 12 g/dL — ABNORMAL LOW (ref 13.0–17.1)
MCH: 24.8 pg — ABNORMAL LOW (ref 27.2–33.4)
MONO#: 0.5 10*3/uL (ref 0.1–0.9)
NEUT#: 3.4 10*3/uL (ref 1.5–6.5)
NEUT%: 56 % (ref 39.0–75.0)
RDW: 18.1 % — ABNORMAL HIGH (ref 11.0–14.6)
WBC: 6 10*3/uL (ref 4.0–10.3)
lymph#: 1.9 10*3/uL (ref 0.9–3.3)

## 2012-05-19 LAB — COMPREHENSIVE METABOLIC PANEL (CC13)
ALT: 24 U/L (ref 0–55)
AST: 18 U/L (ref 5–34)
Albumin: 3.2 g/dL — ABNORMAL LOW (ref 3.5–5.0)
BUN: 43 mg/dL — ABNORMAL HIGH (ref 7.0–26.0)
CO2: 24 mEq/L (ref 22–29)
Calcium: 9.1 mg/dL (ref 8.4–10.4)
Chloride: 106 mEq/L (ref 98–107)
Creatinine: 2 mg/dL — ABNORMAL HIGH (ref 0.7–1.3)
Potassium: 5 mEq/L (ref 3.5–5.1)

## 2012-05-19 NOTE — Telephone Encounter (Signed)
gv and printed appt schedule for March °

## 2012-05-19 NOTE — Progress Notes (Signed)
Hematology and Oncology Follow Up Visit  Frank Ashley [id number] [birth year] 72 y.o. 05/19/2012 1:42 PM   Principle Diagnosis: 72 year old with the following issues: 1. 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 2. Lung nodule: 11 mm noted on the left lower lung. Currently on observation. 3. Left sided adrenal lesion: possible adenoma vs possible mets from renal cancer.   Current therapy: Observation and follow up.   Interim History:  Frank Ashley presents today for a follow up visit. He is a nice man I saw on 10/8 with the above issue. Since my last visit, he reports feeling well since the last visit. Clinically he feels relatively fair. He is actually recovering quite nicely. He has gained most activities of daily living. He is eating well, moving around really well. He has a nonhealing abdominal wound at this time that however it is progressing quite nicely.    Medications: I have reviewed the patient's current medications. Current outpatient prescriptions:aspirin 325 MG EC tablet, Take 975 mg by mouth 2 (two) times daily. , Disp: , Rfl: ;  fenofibrate 160 MG tablet, TAKE ONE TABLET BY MOUTH EVERY DAY, Disp: 90 tablet, Rfl: 3;  furosemide (LASIX) 40 MG tablet, Take 40 mg by mouth daily as needed. For fluid retention., Disp: , Rfl: ;  gabapentin (NEURONTIN) 800 MG tablet, Take 800 mg by mouth 5 (five) times daily., Disp: , Rfl:  hydrOXYzine (ATARAX/VISTARIL) 50 MG tablet, Take 1 tablet (50 mg total) by mouth 3 (three) times daily as needed for itching., Disp: 30 tablet, Rfl: 0;  insulin NPH-insulin regular (HUMULIN 70/30) (70-30) 100 UNIT/ML injection, 80 units in the AM, 70 units in the PM, Disp: 140 mL, Rfl: 3;  lisinopril (PRINIVIL,ZESTRIL) 10 MG tablet, Take 10 mg by mouth daily. TO PROTECT KIDNEYS    TAKES IN AM, Disp: , Rfl:  LORazepam (ATIVAN) 0.5 MG tablet, Take 1 tablet (0.5 mg total) by mouth every 8 (eight) hours as needed for anxiety., Disp:  90 tablet, Rfl: 3;  omeprazole (PRILOSEC) 20 MG capsule, Take 40 mg by mouth 2 (two) times daily., Disp: , Rfl: ;  oxiconazole (OXISTAT) 1 % CREA, Apply 1 application topically as needed. For SKIN RASHES, Disp: , Rfl: ;  simvastatin (ZOCOR) 40 MG tablet, TAKE ONE TABLET BY MOUTH EVERY DAY, Disp: 90 tablet, Rfl: 3 traMADol (ULTRAM) 50 MG tablet, Take 1 tablet (50 mg total) by mouth every 6 (six) hours as needed., Disp: 30 tablet, Rfl: 1;  triamcinolone cream (KENALOG) 0.1 %, Apply topically 2 (two) times daily. Compound with Eucerin 1:1 and apply to affected area bid prn itching, Disp: 454 g, Rfl: 1  Allergies:  Allergies  Allergen Reactions  . Codeine Shortness Of Breath    SOB, NERVOUSNESS  . Hydrocodone Shortness Of Breath  . Oxycodone Shortness Of Breath    SOB, NERVOUSNESS  . Sulfa Antibiotics Rash    Past Medical History, Surgical history, Social history, and Family History were reviewed and updated.  Review of Systems: Constitutional:  Negative for fever, chills, night sweats, anorexia, weight loss, pain. Cardiovascular: no chest pain or dyspnea on exertion Respiratory: negative Neurological: negative Dermatological: negative ENT: negative Skin: Negative. Gastrointestinal: negative Genito-Urinary: negative Hematological and Lymphatic: negative Breast: negative Musculoskeletal: negative Remaining ROS negative. Physical Exam: Blood pressure 128/68, pulse 79, temperature 96.8 F (36 C), temperature source Oral, resp. rate 20, height 6\' 1"  (1.854 m), weight 263 lb 12.8 oz (119.659 kg). ECOG: 1  General appearance: alert Head: Normocephalic, without obvious abnormality, atraumatic Neck: no adenopathy, no carotid bruit, no JVD, supple, symmetrical, trachea midline and thyroid not enlarged, symmetric, no tenderness/mass/nodules Lymph nodes: Cervical, supraclavicular, and axillary nodes normal. Heart:regular rate and rhythm, S1, S2 normal, no murmur, click, rub or  gallop Lung:chest clear, no wheezing, rales, normal symmetric air entry Abdomin: soft, non-tender, without masses or organomegaly EXT:no erythema, induration, or nodules   Lab Results: Lab Results  Component Value Date   WBC 6.0 05/19/2012   HGB 12.0* 05/19/2012   HCT 36.6* 05/19/2012   MCV 75.9* 05/19/2012   PLT 249 05/19/2012     Chemistry      Component Value Date/Time   NA 141 04/03/2012 1017   NA 139 02/08/2012 1322   K 4.0 04/03/2012 1017   K 4.0 02/08/2012 1322   CL 107 04/03/2012 1017   CL 110* 02/08/2012 1322   CO2 26 04/03/2012 1017   CO2 20* 02/08/2012 1322   BUN 27* 04/03/2012 1017   BUN 27.0* 02/08/2012 1322   CREATININE 1.7* 04/03/2012 1017   CREATININE 1.4* 02/08/2012 1322      Component Value Date/Time   CALCIUM 8.9 04/03/2012 1017   CALCIUM 8.3* 02/08/2012 1322   ALKPHOS 68 04/03/2012 1017   ALKPHOS 102 02/08/2012 1322   AST 17 04/03/2012 1017   AST 18 02/08/2012 1322   ALT 15 04/03/2012 1017   ALT 15 02/08/2012 1322   BILITOT 0.6 04/03/2012 1017   BILITOT 0.50 02/08/2012 1322       Impression and Plan:  72 year old gentleman with the following issues:  1. Recent diagnosis of a stage IB renal cell carcinoma, clear cell type measuring 4.5 cm with a Fuhrman nuclear grade 3/4 with positive surgical margins. The patient had been operated by Dr. Mena Goes.I think a radical nephrectomy could hinder Frank Ashley kidney function further. If he should have local recurrence then maybe cryoablation might be  considered at this time.   2. An 11 mm lung nodule as mentioned was incidentally found after abdominal imaging. He was seen by Dr. Donata Clay and felt a repeat CT scan in 2 months is the was to go and certainly agree with that.   3. 2.1 cm left adrenal lesion. The pattern is consistent with an denoma. However, given the mild SUV uptake, it is important to make sure it is not cancer met.  I discussed the option of surgical resection vs serial observation with imaging studies. He is  agreeable to repeat imaging studies in 2 month with the chest imaging.   Eli Hose, MD 1/17/20141:42 PM

## 2012-07-07 ENCOUNTER — Telehealth: Payer: Self-pay | Admitting: Oncology

## 2012-07-07 NOTE — Telephone Encounter (Signed)
Pt called and r/s lab,MD and ct to April 2014 from March , nurse notified

## 2012-07-10 ENCOUNTER — Ambulatory Visit (INDEPENDENT_AMBULATORY_CARE_PROVIDER_SITE_OTHER): Payer: Medicare Other | Admitting: Surgery

## 2012-07-10 ENCOUNTER — Encounter (INDEPENDENT_AMBULATORY_CARE_PROVIDER_SITE_OTHER): Payer: Self-pay | Admitting: Surgery

## 2012-07-10 VITALS — BP 124/82 | HR 76 | Resp 14 | Ht 73.0 in | Wt 265.4 lb

## 2012-07-10 DIAGNOSIS — Z8553 Personal history of malignant neoplasm of renal pelvis: Secondary | ICD-10-CM

## 2012-07-10 DIAGNOSIS — L089 Local infection of the skin and subcutaneous tissue, unspecified: Secondary | ICD-10-CM

## 2012-07-10 NOTE — Patient Instructions (Signed)
Return as needed

## 2012-07-10 NOTE — Progress Notes (Signed)
Subjective:     Patient ID: Frank Ashley, male   DOB: 1941/02/14, 72 y.o.   MRN: [id number]  HPI 72 year old with the following issues:  1. 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  2. Lung nodule: 11 mm noted on the left lower lung. Currently on observation.  3. Left sided adrenal lesion: possible adenoma vs possible mets from renal cancer.  Pt with history of open cholecystectomy and CBD exploration and chronic wound.  Doing well.   Review of Systems  Constitutional: Negative.   HENT: Negative.   Respiratory: Negative.   Cardiovascular: Negative.   Gastrointestinal: Negative.        Objective:   Physical Exam  Constitutional: He appears well-developed and well-nourished.  HENT:  Head: Normocephalic and atraumatic.  Eyes: Conjunctivae are normal. Pupils are equal, round, and reactive to light.  Abdominal: Soft. Bowel sounds are normal.    72     Assessment:     72 year old with the following issues:  1. 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  2. Lung nodule: 11 mm noted on the left lower lung. Currently on observation.  3. Left sided adrenal lesion: possible adenoma vs possible mets from renal cancer.  Chronic wound healed abdomen    Plan:     Return as needed.  Cont oncology follow up

## 2012-07-18 ENCOUNTER — Ambulatory Visit (HOSPITAL_COMMUNITY): Payer: Medicare Other

## 2012-07-18 ENCOUNTER — Other Ambulatory Visit: Payer: Medicare Other | Admitting: Lab

## 2012-07-20 ENCOUNTER — Ambulatory Visit: Payer: Medicare Other | Admitting: Oncology

## 2012-07-21 ENCOUNTER — Other Ambulatory Visit: Payer: Self-pay | Admitting: *Deleted

## 2012-07-21 DIAGNOSIS — R918 Other nonspecific abnormal finding of lung field: Secondary | ICD-10-CM

## 2012-07-25 ENCOUNTER — Other Ambulatory Visit: Payer: Self-pay | Admitting: *Deleted

## 2012-07-25 DIAGNOSIS — R918 Other nonspecific abnormal finding of lung field: Secondary | ICD-10-CM

## 2012-08-09 ENCOUNTER — Other Ambulatory Visit: Payer: Self-pay | Admitting: Oncology

## 2012-08-09 DIAGNOSIS — N2889 Other specified disorders of kidney and ureter: Secondary | ICD-10-CM

## 2012-08-22 ENCOUNTER — Other Ambulatory Visit: Payer: Self-pay | Admitting: *Deleted

## 2012-08-22 MED ORDER — HYDROXYZINE HCL 50 MG PO TABS: 50.0000 mg | ORAL_TABLET | Freq: Three times a day (TID) | ORAL | Status: DC | PRN

## 2012-08-23 ENCOUNTER — Ambulatory Visit: Payer: Medicare Other | Admitting: Cardiothoracic Surgery

## 2012-08-24 ENCOUNTER — Other Ambulatory Visit: Payer: Self-pay | Admitting: Oncology

## 2012-08-24 ENCOUNTER — Ambulatory Visit (HOSPITAL_COMMUNITY)
Admission: RE | Admit: 2012-08-24 | Discharge: 2012-08-24 | Disposition: A | Discharge status: Home / Self Care | Payer: Medicare Other | Source: Ambulatory Visit | Attending: Oncology | Admitting: Oncology

## 2012-08-24 ENCOUNTER — Other Ambulatory Visit (HOSPITAL_BASED_OUTPATIENT_CLINIC_OR_DEPARTMENT_OTHER): Payer: Medicare Other | Admitting: Lab

## 2012-08-24 DIAGNOSIS — C649 Malignant neoplasm of unspecified kidney, except renal pelvis: Secondary | ICD-10-CM

## 2012-08-24 DIAGNOSIS — R911 Solitary pulmonary nodule: Secondary | ICD-10-CM | POA: Insufficient documentation

## 2012-08-24 DIAGNOSIS — N2889 Other specified disorders of kidney and ureter: Secondary | ICD-10-CM

## 2012-08-24 DIAGNOSIS — N210 Calculus in bladder: Secondary | ICD-10-CM | POA: Insufficient documentation

## 2012-08-24 DIAGNOSIS — N201 Calculus of ureter: Secondary | ICD-10-CM | POA: Insufficient documentation

## 2012-08-24 DIAGNOSIS — Z905 Acquired absence of kidney: Secondary | ICD-10-CM | POA: Insufficient documentation

## 2012-08-24 LAB — CBC WITH DIFFERENTIAL/PLATELET
BASO%: 0.7 % (ref 0.0–2.0)
EOS%: 4.9 % (ref 0.0–7.0)
HCT: 40.1 % (ref 38.4–49.9)
MCH: 25.9 pg — ABNORMAL LOW (ref 27.2–33.4)
MCHC: 32.4 g/dL (ref 32.0–36.0)
MONO#: 0.6 10*3/uL (ref 0.1–0.9)
RDW: 16.1 % — ABNORMAL HIGH (ref 11.0–14.6)
WBC: 6 10*3/uL (ref 4.0–10.3)
lymph#: 2.3 10*3/uL (ref 0.9–3.3)

## 2012-08-24 LAB — COMPREHENSIVE METABOLIC PANEL (CC13)
ALT: 15 U/L (ref 0–55)
AST: 17 U/L (ref 5–34)
Albumin: 3.6 g/dL (ref 3.5–5.0)
CO2: 25 mEq/L (ref 22–29)
Calcium: 9.8 mg/dL (ref 8.4–10.4)
Chloride: 107 mEq/L (ref 98–107)
Potassium: 4.8 mEq/L (ref 3.5–5.1)
Sodium: 141 mEq/L (ref 136–145)
Total Protein: 7.1 g/dL (ref 6.4–8.3)

## 2012-08-25 ENCOUNTER — Ambulatory Visit (HOSPITAL_BASED_OUTPATIENT_CLINIC_OR_DEPARTMENT_OTHER): Payer: Medicare Other | Admitting: Oncology

## 2012-08-25 ENCOUNTER — Telehealth: Payer: Self-pay | Admitting: Oncology

## 2012-08-25 VITALS — BP 146/65 | HR 74 | Temp 97.5°F | Resp 18 | Ht 73.0 in | Wt 269.7 lb

## 2012-08-25 DIAGNOSIS — C649 Malignant neoplasm of unspecified kidney, except renal pelvis: Secondary | ICD-10-CM

## 2012-08-25 DIAGNOSIS — E279 Disorder of adrenal gland, unspecified: Secondary | ICD-10-CM

## 2012-08-25 DIAGNOSIS — N2889 Other specified disorders of kidney and ureter: Secondary | ICD-10-CM

## 2012-08-25 DIAGNOSIS — R911 Solitary pulmonary nodule: Secondary | ICD-10-CM

## 2012-08-25 NOTE — Progress Notes (Signed)
Hematology and Oncology Follow Up Visit  Frank Ashley [id number] 1941-02-13 72 y.o. 08/25/2012 12:14 PM   Principle Diagnosis: 72 year old with the following issues: 1. 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 2. Lung nodule: 11 mm noted on the left lower lung. Currently on observation. 3. Left sided adrenal lesion: possible adenoma vs possible mets from renal cancer.   Current therapy: Observation and follow up.   Interim History:  Frank Ashley presents today for a follow up visit. He is a nice man I am following with the above issues. Since my last visit, he reports feeling well since the last visit. Clinically he feels relatively fair. He has gained most activities of daily living. He is eating well, moving around really well. He has a nonhealing abdominal wound and it is progressing quite nicely.    Medications: I have reviewed the patient's current medications. Current outpatient prescriptions:aspirin 325 MG EC tablet, Take 975 mg by mouth 2 (two) times daily. , Disp: , Rfl: ;  fenofibrate 160 MG tablet, TAKE ONE TABLET BY MOUTH EVERY DAY, Disp: 90 tablet, Rfl: 3;  furosemide (LASIX) 40 MG tablet, Take 40 mg by mouth daily as needed. For fluid retention., Disp: , Rfl: ;  gabapentin (NEURONTIN) 800 MG tablet, Take 800 mg by mouth 5 (five) times daily., Disp: , Rfl:  insulin NPH-insulin regular (HUMULIN 70/30) (70-30) 100 UNIT/ML injection, 80 units in the AM, 70 units in the PM, Disp: 140 mL, Rfl: 3;  lisinopril (PRINIVIL,ZESTRIL) 10 MG tablet, Take 10 mg by mouth daily. TO PROTECT KIDNEYS    TAKES IN AM, Disp: , Rfl: ;  LORazepam (ATIVAN) 0.5 MG tablet, Take 1 tablet (0.5 mg total) by mouth every 8 (eight) hours as needed for anxiety., Disp: 90 tablet, Rfl: 3 omeprazole (PRILOSEC) 20 MG capsule, Take 40 mg by mouth 2 (two) times daily., Disp: , Rfl: ;  oxiconazole (OXISTAT) 1 % CREA, Apply 1 application topically as needed. For SKIN RASHES, Disp: ,  Rfl: ;  simvastatin (ZOCOR) 40 MG tablet, TAKE ONE TABLET BY MOUTH EVERY DAY, Disp: 90 tablet, Rfl: 3;  traMADol (ULTRAM) 50 MG tablet, Take 1 tablet (50 mg total) by mouth every 6 (six) hours as needed., Disp: 30 tablet, Rfl: 1 triamcinolone cream (KENALOG) 0.1 %, Apply topically 2 (two) times daily. Compound with Eucerin 1:1 and apply to affected area bid prn itching, Disp: 454 g, Rfl: 1  Allergies:  Allergies  Allergen Reactions  . Codeine Shortness Of Breath    SOB, NERVOUSNESS  . Hydrocodone Shortness Of Breath  . Oxycodone Shortness Of Breath    SOB, NERVOUSNESS  . Sulfa Antibiotics Rash    Past Medical History, Surgical history, Social history, and Family History were reviewed and updated.  Review of Systems: Constitutional:  Negative for fever, chills, night sweats, anorexia, weight loss, pain. Cardiovascular: no chest pain or dyspnea on exertion Respiratory: negative Neurological: negative Dermatological: negative ENT: negative Skin: Negative. Gastrointestinal: negative Genito-Urinary: negative Hematological and Lymphatic: negative Breast: negative Musculoskeletal: negative Remaining ROS negative. Physical Exam: Blood pressure 146/65, pulse 74, temperature 97.5 F (36.4 C), temperature source Oral, resp. rate 18, height 6\' 1"  (1.854 m), weight 269 lb 11.2 oz (122.335 kg). ECOG: 1 General appearance: alert Head: Normocephalic, without obvious abnormality, atraumatic Neck: no adenopathy, no carotid bruit, no JVD, supple, symmetrical, trachea midline and thyroid not enlarged, symmetric, no tenderness/mass/nodules Lymph nodes: Cervical, supraclavicular, and axillary nodes normal. Heart:regular rate  and rhythm, S1, S2 normal, no murmur, click, rub or gallop Lung:chest clear, no wheezing, rales, normal symmetric air entry Abdomin: soft, non-tender, without masses or organomegaly EXT:no erythema, induration, or nodules   Lab Results: Lab Results  Component Value Date    WBC 6.0 08/24/2012   HGB 13.0 08/24/2012   HCT 40.1 08/24/2012   MCV 80.0 08/24/2012   PLT 235 08/24/2012     Chemistry      Component Value Date/Time   NA 141 08/24/2012 1009   NA 141 04/03/2012 1017   K 4.8 08/24/2012 1009   K 4.0 04/03/2012 1017   CL 107 08/24/2012 1009   CL 107 04/03/2012 1017   CO2 25 08/24/2012 1009   CO2 26 04/03/2012 1017   BUN 32.8* 08/24/2012 1009   BUN 27* 04/03/2012 1017   CREATININE 2.2* 08/24/2012 1009   CREATININE 1.7* 04/03/2012 1017      Component Value Date/Time   CALCIUM 9.8 08/24/2012 1009   CALCIUM 8.9 04/03/2012 1017   ALKPHOS 54 08/24/2012 1009   ALKPHOS 68 04/03/2012 1017   AST 17 08/24/2012 1009   AST 17 04/03/2012 1017   ALT 15 08/24/2012 1009   ALT 15 04/03/2012 1017   BILITOT 0.38 08/24/2012 1009   BILITOT 0.6 04/03/2012 1017     CT CHEST, ABDOMEN AND PELVIS WITHOUT CONTRAST  Technique: Multidetector CT imaging of the chest, abdomen and  pelvis was performed following the standard protocol without IV  contrast.  IV contrast was withheld secondary the patient's elevated  creatinine of 2.2.  Comparison: CT 03/07/2012, PET CT 02/08/2012  CT CHEST  Findings: No axillary or supraclavicular lymphadenopathy. No  mediastinal or hilar lymphadenopathy. No pericardial fluid.  Coronary calcifications are present.  Review of the lung parenchyma demonstrate stable pulmonary nodules  at the lung bases. The largest lumbar nodule measures 8 mm in the  medial left lower lobe (image 48) compared to 8 mm on prior.  Exemplary nodule in the right lower lobe measures 6 mm (image 39)  compared to 6 mm on comparison abdominal CT 03/07/2012. The upper  lobe pulmonary nodule is more difficult to compare the CT portion  of the PET CT scan but grossly unchanged.  IMPRESSION:  1. Stable pulmonary nodule at the lung bases and upper lobes.  2. No evidence of disease progression in the thorax.  CT ABDOMEN AND PELVIS  Findings: Non-IV contrast images demonstrate no focal  hepatic  lesion. There is mild fatty infiltration the liver. Post  cholecystectomy. The pancreas is atrophic. The spleen and right  adrenal gland normal.  There is nodular enlargement of the left adrenal gland which  appears to slowly increased in size over time measuring 29 x 27 mm  (image 65) compared to 21 x 20 mm on CT of 12/24/2011. Gland was  mildly hypermetabolic on comparison PET CT scan. The lesion was  not an adenoma by MRI 10/21/2011.  Within the lower pole of the right kidney, at the site of the  partial nephrectomy, there is increased fullness measuring 34 x 40  mm (image 77, series 2) compared to 31 x 30 mm on CT 03/07/2012.  This is a concerning for local recurrence. Left kidney appears  normal.  The stomach, small bowel, and colon are normal.  Abdominal aorta normal caliber. No retroperitoneal periportal  lymphadenopathy.  No free fluid the pelvis. Distal ureteral stones bladder stones.  Prostate gland bladder normal. No pelvic lymphadenopathy. Review  of bone windows demonstrates no  aggressive osseous lesions.  IMPRESSION:  1. Increased fullness within the lower pole of the right kidney at  site of partial nephrectomy site is concerning for local cancer  recurrence. Recommend MRI with and without contrast for further  evaluation. The patient should be able to receive a reduced dose  of Gadolinium safely with current GFR.  2. Interval enlargement of the left adrenal gland. Findings are  concerning for indolent neoplasm.    Impression and Plan:  72 year old gentleman with the following issues:  1. Diagnosis of a stage IB renal cell carcinoma, clear cell type measuring 4.5 cm with a Fuhrman nuclear grade 3/4 with positive surgical margins. The patient had been operated by Dr. Mena Goes.I think a radical nephrectomy could hinder Mr. Gearhart kidney function further. CT scan results discussed today and it may suggest local recurrence. I will prefer him to IR for possible  cryoablation. He is following with Dr. Mena Goes regarding that as well.   2. An 11 mm lung nodule as mentioned was incidentally found after abdominal imaging. CT scan showed stable nodules.   3. 2.1 cm left adrenal lesion. This slightly enlarged. Without a biopsy, it will be difficult to determine exactly what we are dealing with. Adenoma vs a slow growing neoplasm. We will continue to follow for now.   Follow up in three months.    Eli Hose, MD 4/25/201412:14 PM

## 2012-08-25 NOTE — Telephone Encounter (Signed)
gv and printed appt sched and avs for pt for July...called IR and was informed by Victorino Dike to call [phone] and leave vm and the kidney cryoablation will get scheduled

## 2012-08-30 ENCOUNTER — Encounter: Payer: Self-pay | Admitting: Cardiothoracic Surgery

## 2012-08-30 ENCOUNTER — Ambulatory Visit (INDEPENDENT_AMBULATORY_CARE_PROVIDER_SITE_OTHER): Payer: Medicare Other | Admitting: Cardiothoracic Surgery

## 2012-08-30 VITALS — BP 129/79 | HR 93 | Resp 16 | Ht 73.0 in | Wt 269.0 lb

## 2012-08-30 DIAGNOSIS — C641 Malignant neoplasm of right kidney, except renal pelvis: Secondary | ICD-10-CM

## 2012-08-30 DIAGNOSIS — R918 Other nonspecific abnormal finding of lung field: Secondary | ICD-10-CM

## 2012-08-30 DIAGNOSIS — C649 Malignant neoplasm of unspecified kidney, except renal pelvis: Secondary | ICD-10-CM

## 2012-08-30 DIAGNOSIS — E279 Disorder of adrenal gland, unspecified: Secondary | ICD-10-CM

## 2012-08-30 NOTE — Progress Notes (Signed)
PCP is Kristian Covey, MD Referring Provider is Benjiman Core, MD  Chief Complaint  Patient presents with  . Lung Lesion    6 month f/u with CT CHEST/ABD/PELVIS per Dr. Clelia Croft    HPI: 72 year old nonsmoker with 11 mm left lower lobe nodule first noted on CT the abdomen for right renal cell cancer year ago and has not changed.  The patient had a partial right nephrectomy for renal cell carcinoma one year ago the left lower lobe nodule was noted at that time that time. It is smooth round and appears low risk. It was negative on PET scan performed last fall. The patient has no pulmonary symptoms.  The patient is being carefully followed by his oncologist Dr. Clelia Croft and recent scans show possible local recurrence in the right kidney as well as possible metastatic disease of the left adrenal gland. Further MRI scan and possible biopsies are pending. Past Medical History  Diagnosis Date  . Hyperlipidemia   . Renal mass, right   . Diabetes mellitus type 2, insulin dependent   . Diabetic foot ulcer CURRENT RIGHT GREAT TOE WOUND W/ DRAINAGE --  DRESSING CHANGE EVERY OTHER DAY  . History of acute pancreatitis 2008  . OSA (obstructive sleep apnea) CPAP USED UP UNTIL 2 YRS AGO  WHEN MASK WAS DAMAGED   . Renal insufficiency   . GERD (gastroesophageal reflux disease)   . Peripheral neuropathy   . Left leg numbness COMPLETE NUMBNESS FROM HIP DOWN TO FOOT  . Numbness in right leg FROM KNEE DOWN TO FOOT  . History of kidney stones AGE 88  . BPH (benign prostatic hypertrophy)   . Choledocholithiasis with chronic cholecystitis     BILE DUCT STONES AND GALLSTONES  . History of gout STABLE PER PT  . Arthritis HANDS  . History of MRSA infection 10 YRS AGO /  SUPERFICIAL SKIN AREA  . Generalized rash RIGHT SHOULDER/ BACK AREA  . Nocturia   . Frequency of urination   . Hematuria     YRS AGO  . Hypertension   . History of peptic ulcer disease   . CAD (coronary artery disease)     Circumflex  stent 1996  //  catheterization January, 2000, 50-60% mid LAD,  dominant circumflex-50% in-stent restenosis similar to a cath in 1999, RCA small and nondominant,, EF 60%  . Complication of anesthesia clostraphobic  . Preop cardiovascular exam     Cardiac clearance for nephrectomy and cholecystectomy June, 2013  . Ejection fraction     EF 60%, catheterization, 2000  //   EF 55-60%, echo, July, 2013  . Anxiety     SINCE MAY 2013 AND DIAGNOSIS OF CANCER-PT WAS HAVING PANIC ATTACKS-FELT LIKE HE COULDN'T BREATHE--STATES HE IS FEELING BETTER ON ALPRAZOLAM  . Cancer     CANCER IN RIGHT KIDNEY-SURGERY PLANNED  . Acute pancreatitis 03/12/2010    Qualifier: Diagnosis of  By: Caryl Never MD, Bruce      Past Surgical History  Procedure Laterality Date  . Penile prosthesis implant  2008    REMOVED 2010 DUE TO MALFUNCTION  . Esophagogastroduodenoscopy  09/16/2011    Procedure: ESOPHAGOGASTRODUODENOSCOPY (EGD);  Surgeon: Barrie Folk, MD;  Location: [facility];  Service: Endoscopy;  Laterality: N/A;  . Ct perc cholecystostomy  09-17-2011  . Right great toe surg.  2000  . Surg for repair of perferated peptic ulcer  AGE 31  . Bilroth ii procedure  AGE 2    2 MONTHS LATER,  BILROTH II  RECONSTRUCTION  . Coronary angioplasty with stent placement  1996    STENTING X1 CIRCUMFLEX  . Cardiac catheterization  05-04-1998--- REPORT W/ CHART    50%  IN-STENT RESTENOSIS OF CIRCUMFLEX/ 20% LEFT MAIN/ 50-60% LAD/ RCA NORMAL/ LV NORMAL / EF 60%  . Cataract extraction w/ intraocular lens  implant, bilateral  2005  . Cystoscopy/retrograde/ureteroscopy  10/26/2011    Procedure: CYSTOSCOPY/RETROGRADE/URETEROSCOPY;  Surgeon: Antony Haste, MD;  Location: [facility];  Service: Urology;  Laterality: N/A;  RIGHT URETEROSCOPY AND RIGHT RETROGRADE PYELOGRAM  DIABETIC C ARM   . Removal of penile prosthesis    . Cholecystectomy  12/20/2011    Procedure: CHOLECYSTECTOMY WITH COMMON DUCT EXPLORATION;   Surgeon: Maisie Fus A. Cornett, MD;  Location: [facility];  Service: General;  Laterality: N/A;  choloductoscopy  . Intraoperative cholangiogram  12/20/2011    Procedure: INTRAOPERATIVE CHOLANGIOGRAM;  Surgeon: Maisie Fus A. Cornett, MD;  Location: [facility];  Service: General;;  . Partial nephrectomy  12/20/2011    Procedure: NEPHRECTOMY PARTIAL;  Surgeon: Antony Haste, MD;  Location: [facility];  Service: Urology;  Laterality: Right;  Right Partial nephrectomy  . Laparotomy  12/24/2011    Procedure: EXPLORATORY LAPAROTOMY;  Surgeon: Clovis Pu. Cornett, MD;  Location: [facility];  Service: General;  Laterality: N/A;  Replacement of T-tube and Bile duct exploration  . Intraoperative cholangiogram  12/24/2011    Procedure: INTRAOPERATIVE CHOLANGIOGRAM;  Surgeon: Maisie Fus A. Cornett, MD;  Location: [facility];  Service: General;  Laterality: N/A;    Family History  Problem Relation Age of Onset  . Colon cancer Mother 12  . Colon cancer Sister 33  . Colon cancer Brother 72  . Stomach cancer Neg Hx     Social History History  Substance Use Topics  . Smoking status: Former Smoker -- 0.25 packs/day for 2 years    Types: Cigarettes    Quit date: 02/25/1960  . Smokeless tobacco: Never Used  . Alcohol Use: No    Current Outpatient Prescriptions  Medication Sig Dispense Refill  . aspirin 325 MG EC tablet Take 975 mg by mouth 2 (two) times daily.       . fenofibrate 160 MG tablet TAKE ONE TABLET BY MOUTH EVERY DAY  90 tablet  3  . furosemide (LASIX) 40 MG tablet Take 40 mg by mouth daily as needed. For fluid retention.      . gabapentin (NEURONTIN) 800 MG tablet Take 800 mg by mouth 5 (five) times daily.      . insulin NPH-insulin regular (HUMULIN 70/30) (70-30) 100 UNIT/ML injection 80 units in the AM, 70 units in the PM  140 mL  3  . lisinopril (PRINIVIL,ZESTRIL) 10 MG tablet Take 10 mg by mouth daily. TO PROTECT KIDNEYS    TAKES IN AM      . LORazepam (ATIVAN) 0.5 MG tablet Take 1 mg by mouth every 8 (eight)  hours as needed for anxiety.      [name] omeprazole (PRILOSEC) 20 MG capsule Take 40 mg by mouth 2 (two) times daily.      [name] oxiconazole (OXISTAT) 1 % CREA Apply 1 application topically as needed. For SKIN RASHES      . simvastatin (ZOCOR) 40 MG tablet TAKE ONE TABLET BY MOUTH EVERY DAY  90 tablet  3  . traMADol (ULTRAM) 50 MG tablet Take 1 tablet (50 mg total) by mouth every 6 (six) hours as needed.  30 tablet  1  . triamcinolone cream (KENALOG) 0.1 %  Apply topically 2 (two) times daily. Compound with Eucerin 1:1 and apply to affected area bid prn itching  454 g  1   No current facility-administered medications for this visit.    Allergies  Allergen Reactions  . Codeine Shortness Of Breath    SOB, NERVOUSNESS  . Hydrocodone Shortness Of Breath  . Oxycodone Shortness Of Breath    SOB, NERVOUSNESS  . Sulfa Antibiotics Rash    Review of Systems Positive or history of CAD treated percutaneously as noted above Right upper quadrant abdominal wound is finally granulated in healed following open right partial nephrectomy  BP 129/79  Pulse 93  Resp 16  Ht 6\' 1"  (1.854 m)  Wt 269 lb (122.018 kg)  BMI 35.5 kg/m2  SpO2 98% Physical Exam Alert and comfortable Neck without JVD or adenopathy Breath sounds clear and equal Cardiac rhythm regular Multiple abdominal surgical incision is healed well No pedal edema  Diagnostic Tests: CT scan of chest reviewed showing no change in the round left lower lobe 11 mm nodule Impression: Left lower lobe nodule risk for metastatic or malignant potential without change over one year and with negative activity and PET  Plan: The patient will be followed by his oncologist if any changes occur in the left lower lobe nodule I will see the patient back in consultation-return when necessary

## 2012-09-01 ENCOUNTER — Other Ambulatory Visit: Payer: Self-pay | Admitting: Urology

## 2012-09-01 DIAGNOSIS — C649 Malignant neoplasm of unspecified kidney, except renal pelvis: Secondary | ICD-10-CM

## 2012-09-07 ENCOUNTER — Ambulatory Visit
Admission: RE | Admit: 2012-09-07 | Discharge: 2012-09-07 | Disposition: A | Discharge status: Home / Self Care | Payer: Medicare Other | Source: Ambulatory Visit | Attending: Urology | Admitting: Urology

## 2012-09-07 DIAGNOSIS — C649 Malignant neoplasm of unspecified kidney, except renal pelvis: Secondary | ICD-10-CM

## 2012-09-13 ENCOUNTER — Ambulatory Visit
Admission: RE | Admit: 2012-09-13 | Discharge: 2012-09-13 | Disposition: A | Discharge status: Home / Self Care | Payer: Medicare Other | Source: Ambulatory Visit | Attending: Oncology | Admitting: Oncology

## 2012-09-13 DIAGNOSIS — N2889 Other specified disorders of kidney and ureter: Secondary | ICD-10-CM

## 2012-09-18 ENCOUNTER — Other Ambulatory Visit: Payer: Self-pay | Admitting: Family Medicine

## 2012-09-28 ENCOUNTER — Other Ambulatory Visit: Payer: Self-pay | Admitting: *Deleted

## 2012-09-28 MED ORDER — LISINOPRIL 10 MG PO TABS: 10.0000 mg | ORAL_TABLET | Freq: Every day | ORAL | Status: DC

## 2012-10-04 ENCOUNTER — Other Ambulatory Visit (HOSPITAL_COMMUNITY): Payer: Self-pay | Admitting: Nephrology

## 2012-10-04 DIAGNOSIS — C641 Malignant neoplasm of right kidney, except renal pelvis: Secondary | ICD-10-CM

## 2012-10-09 ENCOUNTER — Other Ambulatory Visit: Payer: Self-pay | Admitting: Nephrology

## 2012-10-09 ENCOUNTER — Other Ambulatory Visit: Payer: Self-pay | Admitting: *Deleted

## 2012-10-09 DIAGNOSIS — N50811 Right testicular pain: Secondary | ICD-10-CM

## 2012-10-09 MED ORDER — LORAZEPAM 0.5 MG PO TABS: 1.0000 mg | ORAL_TABLET | Freq: Three times a day (TID) | ORAL | Status: DC | PRN

## 2012-10-09 MED ORDER — LISINOPRIL 10 MG PO TABS: 10.0000 mg | ORAL_TABLET | Freq: Every day | ORAL | Status: DC

## 2012-10-11 ENCOUNTER — Ambulatory Visit
Admission: RE | Admit: 2012-10-11 | Discharge: 2012-10-11 | Disposition: A | Discharge status: Home / Self Care | Payer: Medicare Other | Source: Ambulatory Visit | Attending: Nephrology | Admitting: Nephrology

## 2012-10-11 DIAGNOSIS — N50811 Right testicular pain: Secondary | ICD-10-CM

## 2012-10-12 ENCOUNTER — Encounter (HOSPITAL_COMMUNITY): Admission: RE | Admit: 2012-10-12 | Payer: Medicare Other | Source: Ambulatory Visit

## 2012-10-20 ENCOUNTER — Encounter: Payer: Self-pay | Admitting: Family Medicine

## 2012-10-20 NOTE — Telephone Encounter (Signed)
e

## 2012-10-24 ENCOUNTER — Encounter (HOSPITAL_COMMUNITY)
Admission: RE | Admit: 2012-10-24 | Discharge: 2012-10-24 | Disposition: A | Discharge status: Home / Self Care | Payer: Medicare Other | Source: Ambulatory Visit | Attending: Nephrology | Admitting: Nephrology

## 2012-10-24 DIAGNOSIS — C649 Malignant neoplasm of unspecified kidney, except renal pelvis: Secondary | ICD-10-CM | POA: Insufficient documentation

## 2012-10-24 DIAGNOSIS — C641 Malignant neoplasm of right kidney, except renal pelvis: Secondary | ICD-10-CM

## 2012-10-24 MED ORDER — TECHNETIUM TC 99M MERTIATIDE
15.0000 | Freq: Once | INTRAVENOUS | Status: AC | PRN
  Administered 2012-10-24: 15 via INTRAVENOUS

## 2012-10-29 ENCOUNTER — Other Ambulatory Visit: Payer: Self-pay | Admitting: Family Medicine

## 2012-11-21 ENCOUNTER — Encounter: Payer: Self-pay | Admitting: Nephrology

## 2012-11-23 ENCOUNTER — Other Ambulatory Visit: Payer: Self-pay

## 2012-11-23 MED ORDER — SIMVASTATIN 40 MG PO TABS: ORAL_TABLET | ORAL | Status: DC

## 2012-11-24 ENCOUNTER — Other Ambulatory Visit: Payer: Medicare Other | Admitting: Lab

## 2012-11-24 ENCOUNTER — Ambulatory Visit: Payer: Medicare Other | Admitting: Oncology

## 2012-11-28 ENCOUNTER — Encounter: Payer: Self-pay | Admitting: Oncology

## 2012-12-27 ENCOUNTER — Other Ambulatory Visit: Payer: Self-pay | Admitting: Family Medicine

## 2013-01-11 ENCOUNTER — Telehealth: Payer: Self-pay

## 2013-01-11 NOTE — Telephone Encounter (Signed)
Refill with one additional refill. 

## 2013-01-11 NOTE — Telephone Encounter (Signed)
Lorazepam Last refill #60 1 refill on 10/09/12 Last visit 04/03/12  [facility] pharmacy

## 2013-01-12 MED ORDER — LORAZEPAM 0.5 MG PO TABS: 1.0000 mg | ORAL_TABLET | Freq: Three times a day (TID) | ORAL | Status: DC | PRN

## 2013-01-12 NOTE — Telephone Encounter (Signed)
RX called in to the pharmacy.  

## 2013-01-25 ENCOUNTER — Encounter: Payer: Self-pay | Admitting: Family Medicine

## 2013-01-26 ENCOUNTER — Telehealth: Payer: Self-pay

## 2013-01-26 MED ORDER — INSULIN NPH ISOPHANE & REGULAR (70-30) 100 UNIT/ML ~~LOC~~ SUSP: SUBCUTANEOUS | Status: DC

## 2013-01-26 NOTE — Telephone Encounter (Signed)
Prior auth on NOVOLIN insulin has been approved by [facility] 01/26/13 - 01/26/14, at which time it will need a renewal. Patient is aware.

## 2013-01-26 NOTE — Telephone Encounter (Signed)
Pt sent this message: Frank Ashley is sending you an exception for Novolin 70/30 because Humlin cost is 1278.00 and Novolin is 300.00 Please send [facility] 90 day Rx on Novolin 70/30 to fill I cannot afford Humlin , I am in process of getting Votrient cancer drug Thanks for all you great care over the years, I should have listen more to you   Is okay to change rx for patient

## 2013-01-26 NOTE — Telephone Encounter (Signed)
rx sent to pharmacy

## 2013-01-26 NOTE — Telephone Encounter (Signed)
Switch pt from humulin 70/30 to novolin 70/30 as he has requested.  Would put in instructions to "take as directed"   See pt request.

## 2013-01-26 NOTE — Telephone Encounter (Signed)
Yes. OK to change.  

## 2013-01-31 LAB — HM DIABETES EYE EXAM: HM Diabetic Eye Exam: NORMAL

## 2013-02-15 ENCOUNTER — Other Ambulatory Visit: Payer: Self-pay | Admitting: Family Medicine

## 2013-02-19 ENCOUNTER — Other Ambulatory Visit: Payer: Self-pay | Admitting: Family Medicine

## 2013-02-19 ENCOUNTER — Telehealth: Payer: Self-pay

## 2013-02-19 ENCOUNTER — Encounter: Payer: Self-pay | Admitting: Family Medicine

## 2013-02-19 MED ORDER — GLUCOSE BLOOD VI STRP: ORAL_STRIP | Status: DC

## 2013-02-22 MED ORDER — GLUCOSE BLOOD VI STRP: ORAL_STRIP | Status: DC

## 2013-02-22 NOTE — Telephone Encounter (Addendum)
Pt states he never received glucose blood (TRUETRACK TEST) test strip That were supposed to be sent to [facility]. [facility] states they never received the dx code.so they will not fill. Pt would like you to sent the test strips to  [facility] instead. glucose blood (TRUETRACK TEST) test strip dx code.

## 2013-02-22 NOTE — Telephone Encounter (Signed)
Sent RX to [facility] and patient is informed

## 2013-02-22 NOTE — Addendum Note (Signed)
Addended by: Thomasena Edis on: 02/22/2013 03:56 PM   Modules accepted: Orders

## 2013-02-23 ENCOUNTER — Other Ambulatory Visit: Payer: Self-pay

## 2013-02-23 ENCOUNTER — Telehealth: Payer: Self-pay | Admitting: Family Medicine

## 2013-02-23 MED ORDER — GLUCOSE BLOOD VI STRP: ORAL_STRIP | Status: DC

## 2013-02-23 NOTE — Telephone Encounter (Signed)
Pt states his insurance will not pay for the test strips for the glucose blood (TRUETRACK TEST) test strip Pt would like a new meter that medicare will pay for the test strips. pls advise.

## 2013-02-23 NOTE — Telephone Encounter (Signed)
Called patient and informed patient we have a Accu-Chek Nano machine and that he will need to check with his insurance

## 2013-03-08 ENCOUNTER — Other Ambulatory Visit: Payer: Self-pay

## 2013-04-25 ENCOUNTER — Telehealth: Payer: Self-pay

## 2013-04-25 ENCOUNTER — Telehealth: Payer: Self-pay | Admitting: Family Medicine

## 2013-04-25 MED ORDER — LORAZEPAM 0.5 MG PO TABS: 1.0000 mg | ORAL_TABLET | Freq: Three times a day (TID) | ORAL | Status: DC | PRN

## 2013-04-25 NOTE — Telephone Encounter (Signed)
Pt has scheduled a cpe for 1/13. Pt would like to know if you are getting the blood work from his cancer doc.Dr Greggory Stallion? Pt did not schedule any labs b/c he states he is getting blood work done EOW and will print off for you if you need. pls advise.

## 2013-04-25 NOTE — Addendum Note (Signed)
Addended by: Shelby Dubin E on: 04/25/2013 01:11 PM   Modules accepted: Orders

## 2013-04-25 NOTE — Telephone Encounter (Signed)
Last visit 04/03/12 Last refill 01/12/13 #60 1 refill

## 2013-04-25 NOTE — Telephone Encounter (Signed)
[facility] requesting refill of LORazepam (ATIVAN) 0.5 MG tablet #60, last filled 03/12/13

## 2013-04-25 NOTE — Telephone Encounter (Signed)
Refill for #30 .  Needs office follow up as not seen in one year.

## 2013-04-25 NOTE — Telephone Encounter (Signed)
Can you please call and set up appt for pt. Just a follow up he has not been seen in a year. Thank you. This can be done on Friday if you need to call him

## 2013-04-25 NOTE — Telephone Encounter (Signed)
Called and left message on phamacy VM. And left message on pt voicemail.

## 2013-04-27 NOTE — Telephone Encounter (Signed)
Pt informed to bring labs and to fast just in case he needs to have more labs drawn

## 2013-05-15 ENCOUNTER — Encounter: Payer: Self-pay | Admitting: Family Medicine

## 2013-05-15 ENCOUNTER — Ambulatory Visit (INDEPENDENT_AMBULATORY_CARE_PROVIDER_SITE_OTHER): Payer: Medicare Other | Admitting: Family Medicine

## 2013-05-15 VITALS — BP 130/64 | HR 69 | Temp 97.7°F | Ht 73.0 in | Wt 272.0 lb

## 2013-05-15 DIAGNOSIS — Z0000 Encounter for general adult medical examination without abnormal findings: Secondary | ICD-10-CM

## 2013-05-15 DIAGNOSIS — E1165 Type 2 diabetes mellitus with hyperglycemia: Secondary | ICD-10-CM

## 2013-05-15 DIAGNOSIS — IMO0001 Reserved for inherently not codable concepts without codable children: Secondary | ICD-10-CM

## 2013-05-15 DIAGNOSIS — E669 Obesity, unspecified: Secondary | ICD-10-CM | POA: Insufficient documentation

## 2013-05-15 DIAGNOSIS — I10 Essential (primary) hypertension: Secondary | ICD-10-CM

## 2013-05-15 DIAGNOSIS — Z23 Encounter for immunization: Secondary | ICD-10-CM

## 2013-05-15 LAB — HM DIABETES FOOT EXAM

## 2013-05-15 MED ORDER — LORAZEPAM 0.5 MG PO TABS: 1.0000 mg | ORAL_TABLET | Freq: Four times a day (QID) | ORAL | Status: DC | PRN

## 2013-05-15 NOTE — Patient Instructions (Signed)
Set up appointment with wound care center at [facility].  If you need referral let us know.

## 2013-05-15 NOTE — Progress Notes (Signed)
Pre visit review using our clinic review tool, if applicable. No additional management support is needed unless otherwise documented below in the visit note. 

## 2013-05-15 NOTE — Progress Notes (Signed)
Subjective:    Patient ID: Frank Ashley, male    DOB: 18-Sep-1940, 73 y.o.   MRN: [id number]  HPI Patient seen for Medicare wellness exam and medical followup. Medical problems include history of obesity, type 2 diabetes with complications including neuropathy and nephropathy. He has history of CAD, hypertension, hyperlipidemia, chronic kidney disease, GERD, renal cell carcinoma.  Currently followed at [facility] for metastatic renal cell carcinoma. He had initial surgery here and 6 months later had recurrence of renal cell carcinoma with metastasis to the left adrenal gland also some chest nodules. He is on chemotherapy treatment at [facility]. His current creatinine is around 3.0. He is followed by nephrology. They took him off of ACE inhibitor. His blood pressure is well controlled. He does not have his current medication list with him today.  He has had long-standing history of right toe ulcer and even with treatment at wound care center (including bariatric therapy) this was not fully healed. He has not any recent drainage. He has no pain whatsoever because of advanced neuropathy. Denies any recent redness or swelling.  He had 23 valent pneumonia vaccine several years ago. Flu vaccine earlier this year. He's had previous colonoscopy 2 years ago.  He takes chronic Ativan 1 the morning 1 early afternoon and 2 at night and has been on this regimen for quite some time. No recent falls. No history of misuse. No alcohol use.  Past Medical History  Diagnosis Date  . Hyperlipidemia   . Renal mass, right   . Diabetes mellitus type 2, insulin dependent   . Diabetic foot ulcer CURRENT RIGHT GREAT TOE WOUND W/ DRAINAGE --  DRESSING CHANGE EVERY OTHER DAY  . History of acute pancreatitis 2008  . OSA (obstructive sleep apnea) CPAP USED UP UNTIL 2 YRS AGO  WHEN MASK WAS DAMAGED   . Renal insufficiency   . GERD (gastroesophageal reflux disease)   . Peripheral neuropathy   . Left leg  numbness COMPLETE NUMBNESS FROM HIP DOWN TO FOOT  . Numbness in right leg FROM KNEE DOWN TO FOOT  . History of kidney stones AGE 73  . BPH (benign prostatic hypertrophy)   . Choledocholithiasis with chronic cholecystitis     BILE DUCT STONES AND GALLSTONES  . History of gout STABLE PER PT  . Arthritis HANDS  . History of MRSA infection 10 YRS AGO /  SUPERFICIAL SKIN AREA  . Generalized rash RIGHT SHOULDER/ BACK AREA  . Nocturia   . Frequency of urination   . Hematuria     YRS AGO  . Hypertension   . History of peptic ulcer disease   . CAD (coronary artery disease)     Circumflex stent 1996  //  catheterization January, 2000, 50-60% mid LAD,  dominant circumflex-50% in-stent restenosis similar to a cath in 1999, RCA small and nondominant,, EF 60%  . Complication of anesthesia clostraphobic  . Preop cardiovascular exam     Cardiac clearance for nephrectomy and cholecystectomy June, 2013  . Ejection fraction     EF 60%, catheterization, 2000  //   EF 55-60%, echo, July, 2013  . Anxiety     SINCE MAY 2013 AND DIAGNOSIS OF CANCER-PT WAS HAVING PANIC ATTACKS-FELT LIKE HE COULDN'T BREATHE--STATES HE IS FEELING BETTER ON ALPRAZOLAM  . Cancer     CANCER IN RIGHT KIDNEY-SURGERY PLANNED  . Acute pancreatitis 03/12/2010    Qualifier: Diagnosis of  By: Elease Hashimoto MD, Tennyson Kallen     Past Surgical History  Procedure Laterality Date  . Penile prosthesis implant  2008    REMOVED 2010 DUE TO MALFUNCTION  . Esophagogastroduodenoscopy  09/16/2011    Procedure: ESOPHAGOGASTRODUODENOSCOPY (EGD);  Surgeon: Missy Sabins, MD;  Location: [facility];  Service: Endoscopy;  Laterality: N/A;  . Ct perc cholecystostomy  09-17-2011  . Right great toe surg.  2000  . Surg for repair of perferated peptic ulcer  AGE 73  . Bilroth ii procedure  AGE 73    2 MONTHS LATER,  BILROTH II RECONSTRUCTION  . Coronary angioplasty with stent placement  1996    STENTING X1 CIRCUMFLEX  . Cardiac catheterization  05-04-1998---  REPORT W/ CHART    50%  IN-STENT RESTENOSIS OF CIRCUMFLEX/ 20% LEFT MAIN/ 50-60% LAD/ RCA NORMAL/ LV NORMAL / EF 60%  . Cataract extraction w/ intraocular lens  implant, bilateral  2005  . Cystoscopy/retrograde/ureteroscopy  10/26/2011    Procedure: CYSTOSCOPY/RETROGRADE/URETEROSCOPY;  Surgeon: Fredricka Bonine, MD;  Location: [facility];  Service: Urology;  Laterality: N/A;  RIGHT URETEROSCOPY AND RIGHT RETROGRADE PYELOGRAM  DIABETIC C ARM   . Removal of penile prosthesis    . Cholecystectomy  12/20/2011    Procedure: CHOLECYSTECTOMY WITH COMMON DUCT EXPLORATION;  Surgeon: Marcello Moores A. Cornett, MD;  Location: [facility];  Service: General;  Laterality: N/A;  choloductoscopy  . Intraoperative cholangiogram  12/20/2011    Procedure: INTRAOPERATIVE CHOLANGIOGRAM;  Surgeon: Marcello Moores A. Cornett, MD;  Location[facility];  Service: General;;  . Partial nephrectomy  12/20/2011    Procedure: NEPHRECTOMY PARTIAL;  Surgeon: Fredricka Bonine, MD;  [facility];  Service: Urology;  Laterality: Right;  Right Partial nephrectomy  . Laparotomy  12/24/2011    Procedure: EXPLORATORY LAPAROTOMY;  Surgeon: Joyice Faster. Cornett, MD;  [facility];  Service: General;  Laterality: N/A;  Replacement of T-tube and Bile duct exploration  . Intraoperative cholangiogram  12/24/2011    Procedure: INTRAOPERATIVE CHOLANGIOGRAM;  Surgeon: Marcello Moores A. Cornett, MD;  Location: [facility];  Service: General;  Laterality: N/A;    reports that he quit smoking about 53 years ago. His smoking use included Cigarettes. He has a .5 pack-year smoking history. He has never used smokeless tobacco. He reports that he does not drink alcohol or use illicit drugs. family history includes Colon cancer (age of onset: 24) in his sister; Colon cancer (age of onset: 63) in his mother; Colon cancer (age of onset: 8) in his brother. There is no history of Stomach cancer. Allergies  Allergen Reactions  . Codeine Shortness Of Breath     SOB, NERVOUSNESS  . Hydrocodone Shortness Of Breath  . Oxycodone Shortness Of Breath    SOB, NERVOUSNESS  . Sulfa Antibiotics Rash   1.  Risk factors based on Past Medical , Social, and Family history reviewed and as above 2.  Limitations in physical activities no consistent exercise 3.  Depression/mood mood stable. He has some chronic anxiety which is stable 4.  Hearing no deficits 5.  ADLs independent in all 6.  Cognitive function (orientation to time and place, language, writing, speech,memory) no memory deficits. Language and judgment intact 7.  Home Safety no issues  8.  Height, weight, and visual acuity. All stable 9.  Counseling discussed weight management. 10. Recommendation of preventive services. Prevnar 13 11. Labs based on risk factors no labs obtained. He is getting extensive lab workup or [place] 12. Care Plan would recommend referral back to wound care center and he plans to do this  at [facility] regarding his right toe ulcer    Review of Systems  Constitutional: Negative for fever, activity change, appetite change and fatigue.  HENT: Negative for congestion, ear pain and trouble swallowing.   Eyes: Negative for pain and visual disturbance.  Respiratory: Negative for cough, shortness of breath and wheezing.   Cardiovascular: Negative for chest pain and palpitations.  Gastrointestinal: Negative for nausea, vomiting, abdominal pain, diarrhea, constipation, blood in stool, abdominal distention and rectal pain.  Endocrine: Negative for polydipsia and polyuria.  Genitourinary: Negative for dysuria, hematuria and testicular pain.  Musculoskeletal: Negative for arthralgias and joint swelling.  Skin: Positive for wound. Negative for rash.  Neurological: Negative for dizziness, syncope and headaches.  Hematological: Negative for adenopathy.  Psychiatric/Behavioral: Negative for confusion and dysphoric mood.       Objective:   Physical Exam  Constitutional:  He is oriented to person, place, and time. He appears well-developed and well-nourished. No distress.  HENT:  Head: Normocephalic and atraumatic.  Right Ear: External ear normal.  Left Ear: External ear normal.  Mouth/Throat: Oropharynx is clear and moist.  Eyes: Conjunctivae and EOM are normal. Pupils are equal, round, and reactive to light.  Neck: Normal range of motion. Neck supple. No thyromegaly present.  Cardiovascular: Normal rate, regular rhythm and normal heart sounds.   No murmur heard. Pulmonary/Chest: No respiratory distress. He has no wheezes. He has no rales.  Abdominal: Soft. Bowel sounds are normal. He exhibits no distension and no mass. There is no tenderness. There is no rebound and no guarding.  Musculoskeletal: He exhibits no edema.  Lymphadenopathy:    He has no cervical adenopathy.  Neurological: He is alert and oriented to person, place, and time. He displays normal reflexes. No cranial nerve deficit.  Skin: No rash noted.  Patient has right toe ulcer volar surface right great toe which is 8 x 12 mm. About 3 mm deep. Thick calloused rim and used #15 blade and debrided fairly extensively around the rim of the ulcer. No active drainage. No surrounding erythema  Patient has also has very small 2 x 2 millimeter superficial left toe ulcer.  No drainage and no erythema.  Psychiatric: He has a normal mood and affect.          Assessment & Plan:  #1 health maintenance. Prevnar 13 given. Continue yearly flu vaccine #2 renal cell carcinoma with mets followed at [facility] #3 chronic kidney disease with current creatinine around 3.0 #4 nonhealing long-standing right diabetic toe ulcer. We recommend wound care treatment at [facility] and he will be consulting with them next week #5 type 2 diabetes with history of poor control. Currently managed with insulin #6 hypertension which is currently stable #7 hyperlipidemia #8 chronic anxiety.  He has been on Lorazepam  for some time and requesting refills today.

## 2013-05-19 IMAGING — CT CT ABD-PELV W/O CM
2 of 3 series · 17 of 39 positions shown, 19 images · non-contrast
Comparison: 12/26/2011

CLINICAL DATA: Flank pain

CT ABDOMEN AND PELVIS WITHOUT CONTRAST
TECHNIQUE: Multidetector CT imaging of the abdomen and pelvis was
performed following the standard protocol without intravenous
contrast.

[Series 4: lung windows · axial · 0.98mm/px · z∈[-147,-37]mm · 14 of 25 slices shown, 16 images]
[im 2/25  soft-tissue]
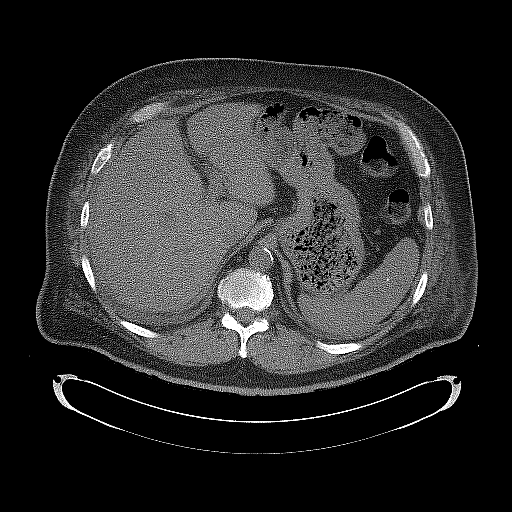
[im 2/25  bone]
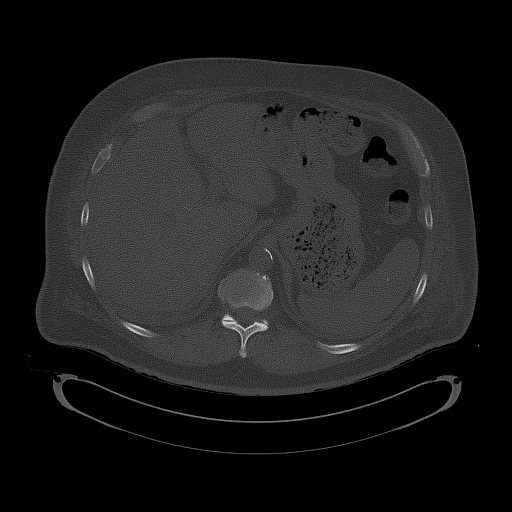
[im 4/25  soft-tissue]
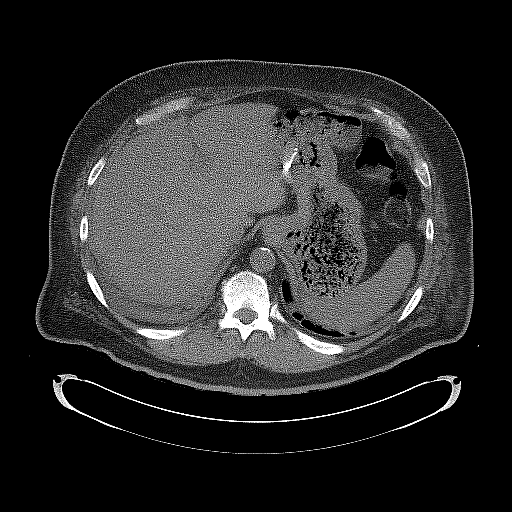
[im 6/25  soft-tissue]
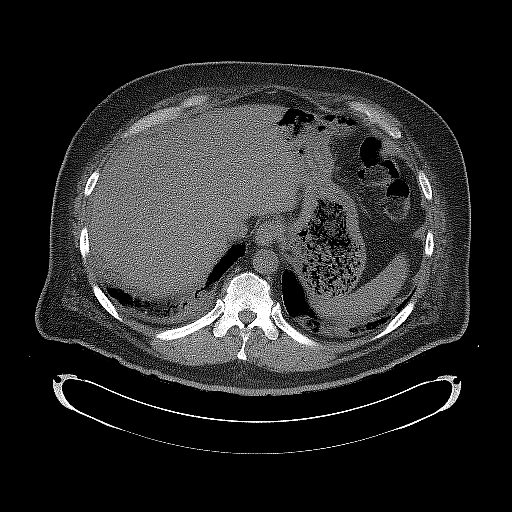
[im 7/25  soft-tissue]
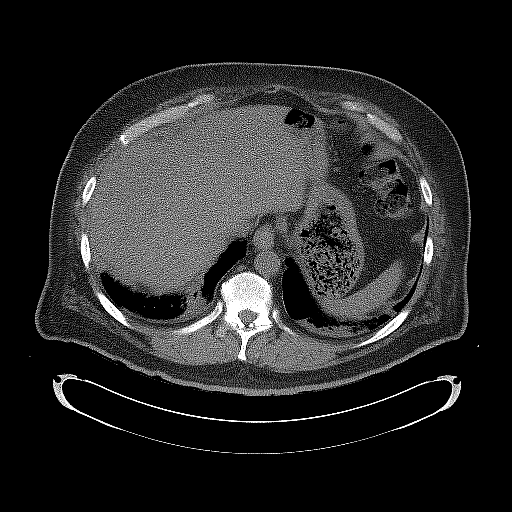
[im 9/25  soft-tissue]
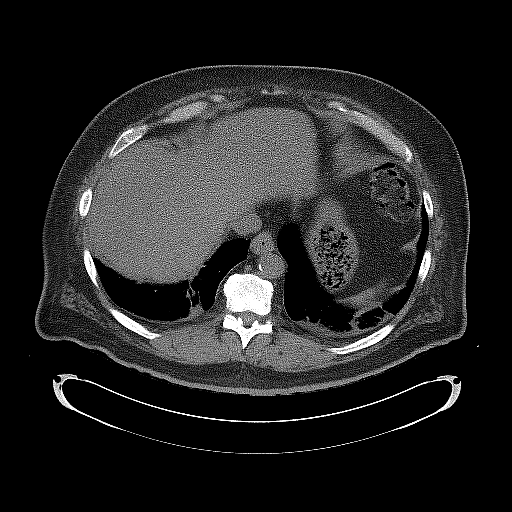
[im 11/25  soft-tissue]
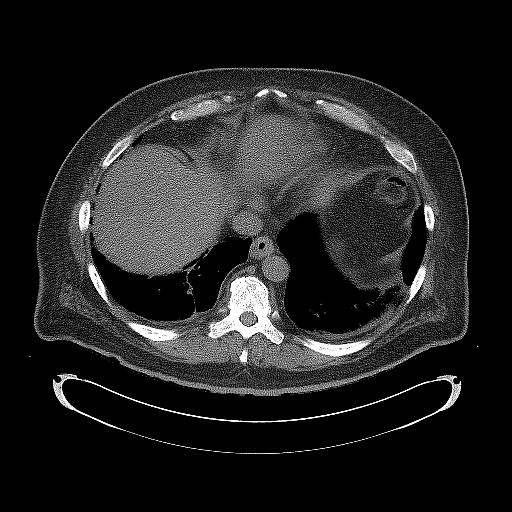
[im 12/25  soft-tissue]
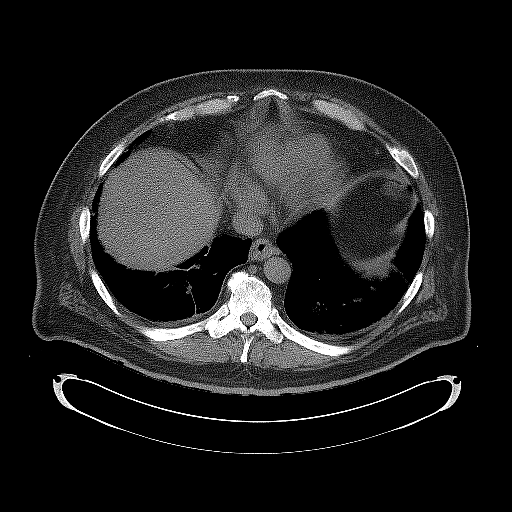
[im 14/25  soft-tissue]
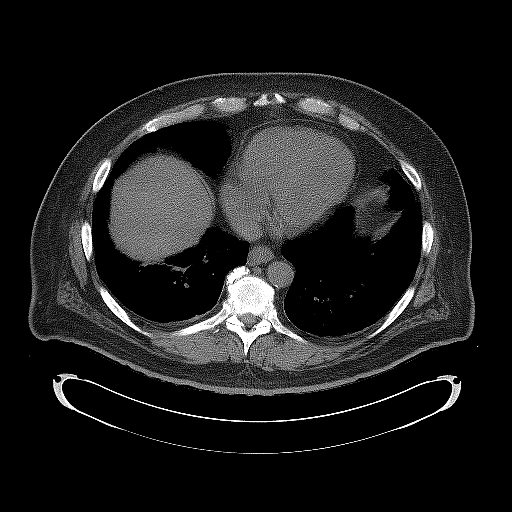
[im 15/25  soft-tissue]
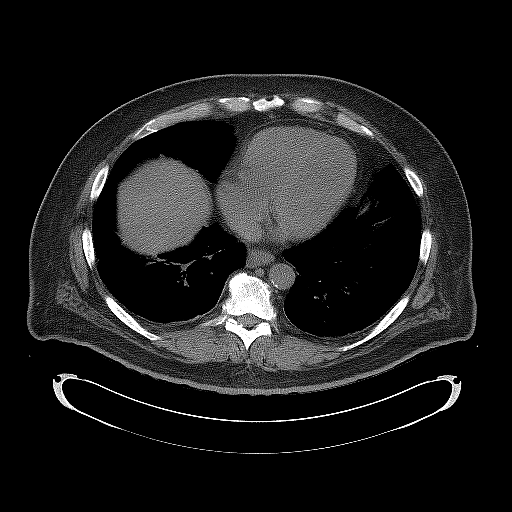
[im 15/25  bone]
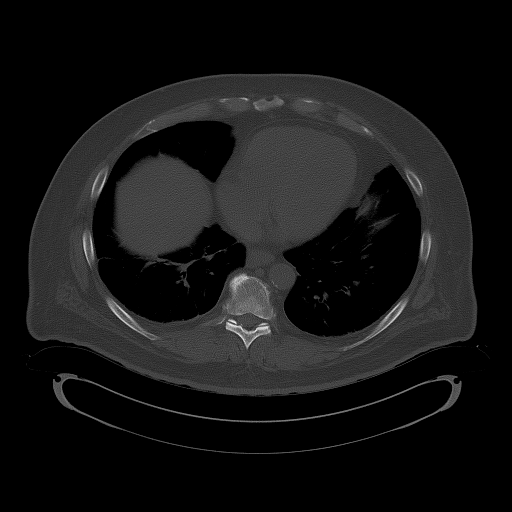
[im 17/25  soft-tissue]
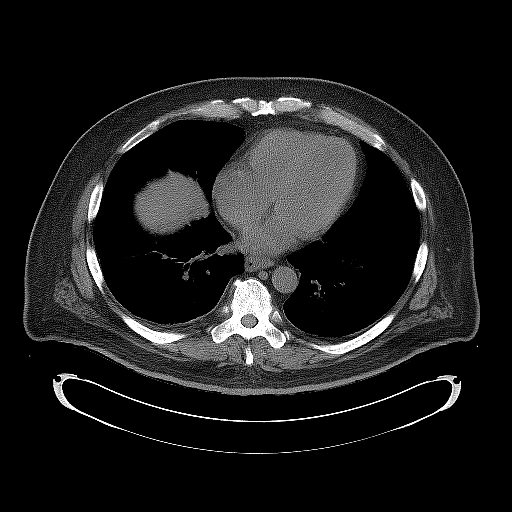
[im 19/25  soft-tissue]
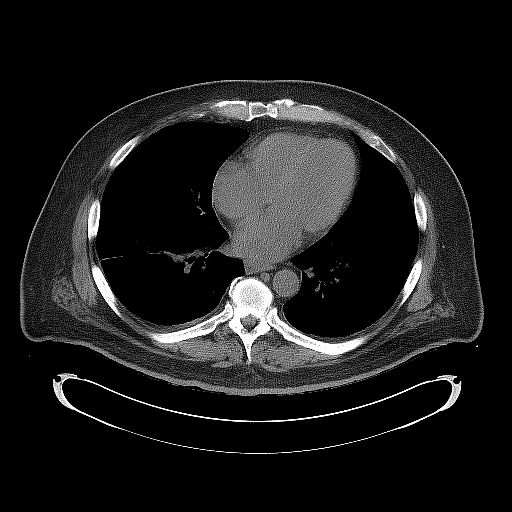
[im 20/25  soft-tissue]
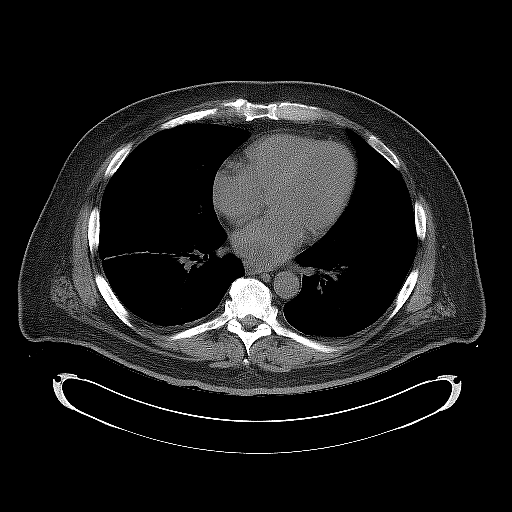
[im 22/25  soft-tissue]
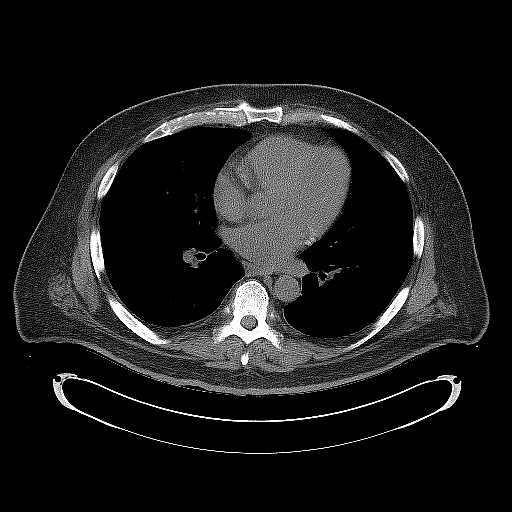
[im 24/25  soft-tissue]
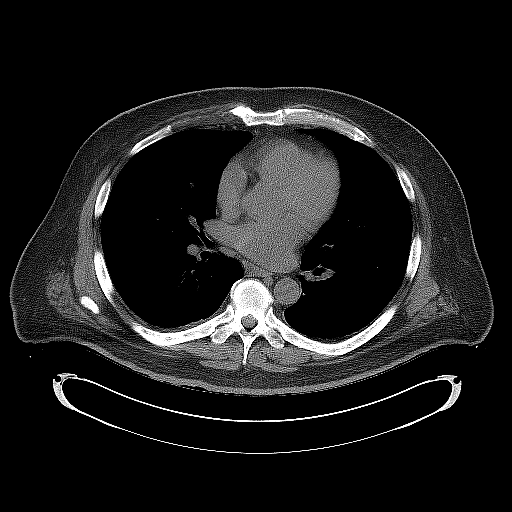

[Series 602: <mpr thick range> · coronal · 1.01mm/px · 3 of 109 slices shown]
[im 37/109  soft-tissue]
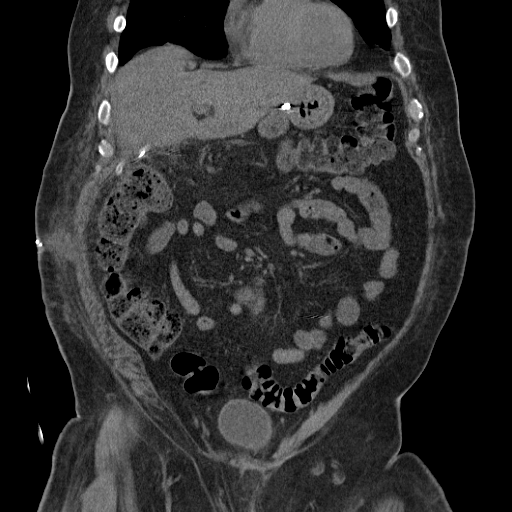
[im 49/109  soft-tissue]
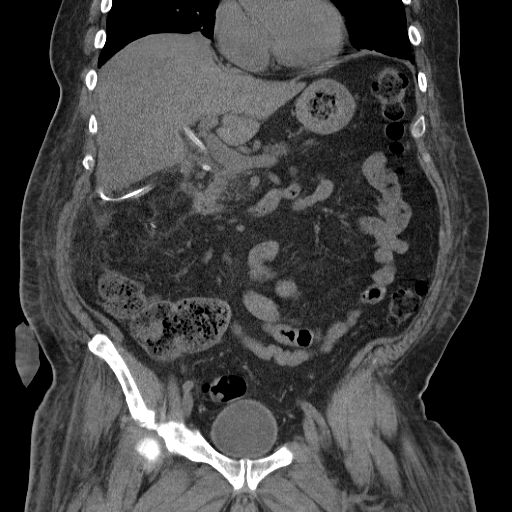
[im 61/109  soft-tissue]
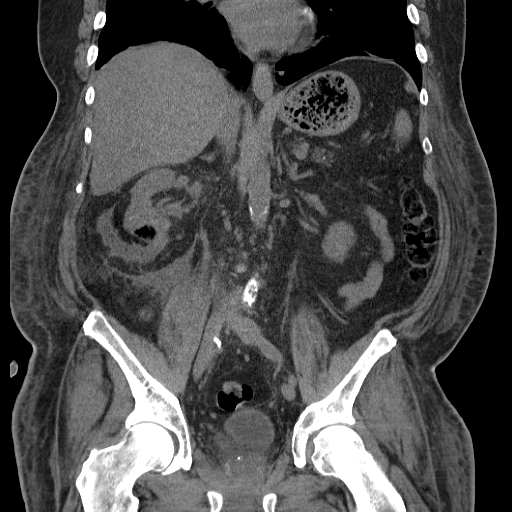

[17 of 39 positions shown; findings below may reference images not displayed]

FINDINGS: Atelectasis is noted in both lung bases.

Postoperative changes of recent cholecystectomy identified.  There
is a drainage catheter identified within the gallbladder fossa.
Gas is noted within the gallbladder fossa.  There is no focal fluid
collections noted.  The pancreas is normal.  Normal appearance of
the spleen.

The right adrenal gland appears normal.  Nodule in the left adrenal
gland is unchanged measuring 1.8 cm and 44 HU, image 34.  The left
kidney is normal.  There are changes compatible with partial right
nephrectomy.  Fluid collection in the nephrectomy bed contains gas
and measures 5.6 x 5.7 x 3.3 cm, image 51.  Previously this fluid
collection measured 5.7 x 4.30 x 3.5 cm.  Gas is noted within the
lumen of the urinary bladder.  Prostate gland and seminal vesicles
are unremarkable.

No adenopathy noted within the upper abdomen.  There is no pelvic
or inguinal adenopathy.

The debris and gas fills the lumen of the stomach.  The small bowel
loops have a normal caliber without evidence for obstruction.
There is moderate stool identified throughout the colon up to the
level of the rectum.

Review of the visualized osseous structures is significant for
lumbar degenerative disc disease.
IMPRESSION: 1.  No evidence for bowel obstruction or ileus.
2.  Fluid collection within partial nephrectomy bed is identified
containing gas.  This is not significantly changed in size from
12/24/2011.
3.  Stable position of drainage catheters within the gallbladder
fossa with a small amount of surrounding gas.  No fluid collection
within the gallbladder fossa noted.

## 2013-05-21 ENCOUNTER — Encounter: Payer: Self-pay | Admitting: Family Medicine

## 2013-05-21 ENCOUNTER — Other Ambulatory Visit: Payer: Self-pay

## 2013-07-10 ENCOUNTER — Ambulatory Visit: Payer: Medicare Other | Admitting: Family Medicine

## 2013-07-16 ENCOUNTER — Telehealth: Payer: Self-pay | Admitting: Family Medicine

## 2013-07-16 ENCOUNTER — Other Ambulatory Visit: Payer: Self-pay | Admitting: Family Medicine

## 2013-07-16 NOTE — Telephone Encounter (Signed)
[facility] - [place] - [address] requesting re-fill on LORazepam (ATIVAN) 0.5 MG tablet

## 2013-07-18 NOTE — Telephone Encounter (Signed)
Too soon. Should not need refills yet

## 2013-07-18 NOTE — Telephone Encounter (Signed)
Last visit 05/15/13 Last refill 05/15/13 #120 3 refills

## 2013-07-18 NOTE — Telephone Encounter (Signed)
Called pharmacist and pt does have refills at the pharmacy

## 2013-08-09 ENCOUNTER — Other Ambulatory Visit: Payer: Self-pay

## 2013-09-12 ENCOUNTER — Other Ambulatory Visit: Payer: Self-pay | Admitting: Family Medicine

## 2013-10-02 ENCOUNTER — Telehealth: Payer: Self-pay | Admitting: Family Medicine

## 2013-10-02 NOTE — Telephone Encounter (Signed)
[employer] sent PA request for oxiconazole (OXISTAT) 1 % CREA.  I don't see a current re-fill on it by you, is it okay to submit??  If so, what is the diagnosis??

## 2013-10-03 MED ORDER — OXICONAZOLE NITRATE 1 % EX CREA: 1.0000 "application " | TOPICAL_CREAM | CUTANEOUS | Status: DC | PRN

## 2013-10-03 NOTE — Telephone Encounter (Signed)
Dx; fungal skin rash.  I am not sure who prescribed initially.

## 2013-10-03 NOTE — Telephone Encounter (Signed)
I received a callback from Fritz Pickerel the Triage nurse in Dr. Moshe Cipro office and they stated that they did not re-fill pt's medication.  Can you please send a RX for oxiconazole (OXISTAT) 1 % CREA to  [place], [place] - [address].  This will have the RX under your name and I can submit the PA once it is received.  I confirmed with the pt what his preferred pharmacy is as well.

## 2013-10-03 NOTE — Addendum Note (Signed)
Addended by: Noe Gens E on: 10/03/2013 01:25 PM   Modules accepted: Orders

## 2013-10-03 NOTE — Telephone Encounter (Signed)
Rx sent to pharmacy   

## 2013-10-03 NOTE — Telephone Encounter (Signed)
I tried to process the PA with the insurance company but the RX was filled by Dr. Lawanna Kobus at [facility].  The PA information is under that physician and their office approved the re-fill.  I called the office and left a message for his re-fill nurse notifying them of the PA request and that they approved the re-fill.   I called the pt and he said the pharmacy sent to re-fill request to Dr. Iona Beard and they in deed approved the re-fill.  I explained the situation about the PA to the pt and of the steps I took to help get the PA resolved.  Pt understood and said he will also call their office to make sure he is able to get his RX.  I told him that they are not going to take ownership of the PA request, then have the pharmacy send the re-fill request to [place] and see if Dr. Elease Hashimoto will fill.

## 2013-10-12 ENCOUNTER — Telehealth: Payer: Self-pay | Admitting: Family Medicine

## 2013-10-12 NOTE — Telephone Encounter (Signed)
Tramadol last refill 03/13/12 #30 1 refill Lorazepam last refill 05/15/13 #120 3 refill Ambien no refill  Last visit 05/15/13

## 2013-10-12 NOTE — Telephone Encounter (Signed)
[facility] - [place] - [address] requesting refills of the following:  traMADol (ULTRAM) 50 MG tablet LORazepam (ATIVAN) 0.5 MG tablet zolpidem (AMBIEN) 10 MG tablet

## 2013-10-14 NOTE — Telephone Encounter (Signed)
Should not take ambien and lorazepam together.  May refill lorazepam for 3 months.  Refill Ultram once.   Needs office follow up.

## 2013-10-16 MED ORDER — TRAMADOL HCL 50 MG PO TABS: 50.0000 mg | ORAL_TABLET | Freq: Four times a day (QID) | ORAL | Status: DC | PRN

## 2013-10-16 MED ORDER — LORAZEPAM 0.5 MG PO TABS: 1.0000 mg | ORAL_TABLET | Freq: Four times a day (QID) | ORAL | Status: DC | PRN

## 2013-10-16 NOTE — Telephone Encounter (Signed)
RX's called into pharmacy

## 2013-10-24 ENCOUNTER — Other Ambulatory Visit: Payer: Self-pay

## 2013-10-24 MED ORDER — GLUCOSE BLOOD VI STRP: ORAL_STRIP | Status: DC

## 2013-11-20 ENCOUNTER — Telehealth: Payer: Self-pay | Admitting: *Deleted

## 2013-11-20 NOTE — Telephone Encounter (Signed)
Spoke with patient and he would like to have an a1c done.  He is followed at [facility] but has not had an a1c.  Patient is also due a lipid panel.  Patient will come in fasting so Dr Elease Hashimoto can order labs necessary.  Diabetic bundle

## 2013-11-23 ENCOUNTER — Ambulatory Visit: Payer: Medicare Other | Admitting: Family Medicine

## 2013-11-27 ENCOUNTER — Encounter: Payer: Self-pay | Admitting: Family Medicine

## 2013-11-27 ENCOUNTER — Ambulatory Visit (INDEPENDENT_AMBULATORY_CARE_PROVIDER_SITE_OTHER): Payer: Medicare Other | Admitting: Family Medicine

## 2013-11-27 VITALS — BP 128/68 | HR 72 | Temp 98.3°F | Wt 258.0 lb

## 2013-11-27 DIAGNOSIS — E1169 Type 2 diabetes mellitus with other specified complication: Secondary | ICD-10-CM

## 2013-11-27 DIAGNOSIS — C801 Malignant (primary) neoplasm, unspecified: Secondary | ICD-10-CM

## 2013-11-27 DIAGNOSIS — L97509 Non-pressure chronic ulcer of other part of unspecified foot with unspecified severity: Secondary | ICD-10-CM

## 2013-11-27 DIAGNOSIS — C649 Malignant neoplasm of unspecified kidney, except renal pelvis: Secondary | ICD-10-CM

## 2013-11-27 DIAGNOSIS — L97519 Non-pressure chronic ulcer of other part of right foot with unspecified severity: Secondary | ICD-10-CM

## 2013-11-27 DIAGNOSIS — E11621 Type 2 diabetes mellitus with foot ulcer: Secondary | ICD-10-CM

## 2013-11-27 DIAGNOSIS — E785 Hyperlipidemia, unspecified: Secondary | ICD-10-CM

## 2013-11-27 DIAGNOSIS — Z794 Long term (current) use of insulin: Secondary | ICD-10-CM

## 2013-11-27 DIAGNOSIS — L97529 Non-pressure chronic ulcer of other part of left foot with unspecified severity: Secondary | ICD-10-CM

## 2013-11-27 DIAGNOSIS — E119 Type 2 diabetes mellitus without complications: Secondary | ICD-10-CM

## 2013-11-27 LAB — LIPID PANEL
CHOLESTEROL: 90 mg/dL (ref 0–200)
HDL: 26.2 mg/dL — AB (ref >39.00)
LDL CALC: 30 mg/dL (ref 0–99)
NonHDL: 63.8
TRIGLYCERIDES: 171 mg/dL — AB (ref 0.0–149.0)
Total CHOL/HDL Ratio: 3
VLDL: 34.2 mg/dL (ref 0.0–40.0)

## 2013-11-27 LAB — BASIC METABOLIC PANEL
BUN: 19 mg/dL (ref 6–23)
CHLORIDE: 109 meq/L (ref 96–112)
CO2: 23 mEq/L (ref 19–32)
CREATININE: 2.2 mg/dL — AB (ref 0.4–1.5)
Calcium: 8.8 mg/dL (ref 8.4–10.5)
GFR: 30.75 mL/min — ABNORMAL LOW (ref >60.00)
Glucose, Bld: 65 mg/dL — ABNORMAL LOW (ref 70–99)
Potassium: 4.3 mEq/L (ref 3.5–5.1)
Sodium: 138 mEq/L (ref 135–145)

## 2013-11-27 LAB — HEPATIC FUNCTION PANEL
ALT: 12 U/L (ref 0–53)
AST: 21 U/L (ref 0–37)
Albumin: 3.8 g/dL (ref 3.5–5.2)
Alkaline Phosphatase: 46 U/L (ref 39–117)
BILIRUBIN DIRECT: 0.1 mg/dL (ref 0.0–0.3)
TOTAL PROTEIN: 7 g/dL (ref 6.0–8.3)
Total Bilirubin: 0.7 mg/dL (ref 0.2–1.2)

## 2013-11-27 LAB — HEMOGLOBIN A1C: Hgb A1c MFr Bld: 7.1 % — ABNORMAL HIGH (ref 4.6–6.5)

## 2013-11-27 MED ORDER — FENOFIBRATE 160 MG PO TABS: ORAL_TABLET | ORAL | Status: DC

## 2013-11-27 NOTE — Progress Notes (Signed)
Pre visit review using our clinic review tool, if applicable. No additional management support is needed unless otherwise documented below in the visit note. 

## 2013-11-27 NOTE — Progress Notes (Signed)
Subjective:    Patient ID: Frank Ashley, male    DOB: November 13, 1940, 73 y.o.   MRN: [id number]  Diabetes Pertinent negatives for hypoglycemia include no dizziness or headaches. Pertinent negatives for diabetes include no chest pain, no fatigue, no polydipsia, no polyuria and no weakness.   Here for medical followup. He's been lost to follow up here for some time because of his metastatic renal cancer followed at [facility]. He's lost 14 pounds since he was here last. States appetite is fair.  Type 2 diabetes. History of poor control by most recent A1C which was over one year ago on record here 6.6%. Early sugars in the morning frequently less than 100 but frequently over 300 at night. He takes 70/30 insulin and usually 80 units at night and morning insulin dosing has varied tremendously. He has chronic ulcers and calluses on both great toes. He has not seen a podiatrist recently. Denies any drainage or erythema.  Dyslipidemia. Remains on Lipitor. He has history of CAD but no recent chest pains. Other medications reviewed.   Past Medical History  Diagnosis Date  . Hyperlipidemia   . Renal mass, right   . Diabetes mellitus type 2, insulin dependent   . Diabetic foot ulcer CURRENT RIGHT GREAT TOE WOUND W/ DRAINAGE --  DRESSING CHANGE EVERY OTHER DAY  . History of acute pancreatitis 2008  . OSA (obstructive sleep apnea) CPAP USED UP UNTIL 2 YRS AGO  WHEN MASK WAS DAMAGED   . Renal insufficiency   . GERD (gastroesophageal reflux disease)   . Peripheral neuropathy   . Left leg numbness COMPLETE NUMBNESS FROM HIP DOWN TO FOOT  . Numbness in right leg FROM KNEE DOWN TO FOOT  . History of kidney stones AGE 89  . BPH (benign prostatic hypertrophy)   . Choledocholithiasis with chronic cholecystitis     BILE DUCT STONES AND GALLSTONES  . History of gout STABLE PER PT  . Arthritis HANDS  . History of MRSA infection 10 YRS AGO /  SUPERFICIAL SKIN AREA  . Generalized rash RIGHT SHOULDER/ BACK  AREA  . Nocturia   . Frequency of urination   . Hematuria     YRS AGO  . Hypertension   . History of peptic ulcer disease   . CAD (coronary artery disease)     Circumflex stent 1996  //  catheterization January, 2000, 50-60% mid LAD,  dominant circumflex-50% in-stent restenosis similar to a cath in 1999, RCA small and nondominant,, EF 60%  . Complication of anesthesia clostraphobic  . Preop cardiovascular exam     Cardiac clearance for nephrectomy and cholecystectomy June, 2013  . Ejection fraction     EF 60%, catheterization, 2000  //   EF 55-60%, echo, July, 2013  . Anxiety     SINCE MAY 2013 AND DIAGNOSIS OF CANCER-PT WAS HAVING PANIC ATTACKS-FELT LIKE HE COULDN'T BREATHE--STATES HE IS FEELING BETTER ON ALPRAZOLAM  . Cancer     CANCER IN RIGHT KIDNEY-SURGERY PLANNED  . Acute pancreatitis 03/12/2010    Qualifier: Diagnosis of  By: Elease Hashimoto MD, Fani Rotondo     Past Surgical History  Procedure Laterality Date  . Penile prosthesis implant  2008    REMOVED 2010 DUE TO MALFUNCTION  . Esophagogastroduodenoscopy  09/16/2011    Procedure: ESOPHAGOGASTRODUODENOSCOPY (EGD);  Surgeon: Missy Sabins, MD;  Location: [facility];  Service: Endoscopy;  Laterality: N/A;  . Ct perc cholecystostomy  09-17-2011  . Right great toe surg.  2000  .  Surg for repair of perferated peptic ulcer  AGE 70  . Bilroth ii procedure  AGE 77    2 MONTHS LATER,  BILROTH II RECONSTRUCTION  . Coronary angioplasty with stent placement  1996    STENTING X1 CIRCUMFLEX  . Cardiac catheterization  05-04-1998--- REPORT W/ CHART    50%  IN-STENT RESTENOSIS OF CIRCUMFLEX/ 20% LEFT MAIN/ 50-60% LAD/ RCA NORMAL/ LV NORMAL / EF 60%  . Cataract extraction w/ intraocular lens  implant, bilateral  2005  . Cystoscopy/retrograde/ureteroscopy  10/26/2011    Procedure: CYSTOSCOPY/RETROGRADE/URETEROSCOPY;  Surgeon: Fredricka Bonine, MD;  Location: [facility];  Service: Urology;  Laterality: N/A;  RIGHT URETEROSCOPY  AND RIGHT RETROGRADE PYELOGRAM  DIABETIC C ARM   . Removal of penile prosthesis    . Cholecystectomy  12/20/2011    Procedure: CHOLECYSTECTOMY WITH COMMON DUCT EXPLORATION;  Surgeon: Marcello Moores A. Cornett, MD;  Location: [facility];  Service: General;  Laterality: N/A;  choloductoscopy  . Intraoperative cholangiogram  12/20/2011    Procedure: INTRAOPERATIVE CHOLANGIOGRAM;  Surgeon: Marcello Moores A. Cornett, MD;  Location: [facility];  Service: General;;  . Partial nephrectomy  12/20/2011    Procedure: NEPHRECTOMY PARTIAL;  Surgeon: Fredricka Bonine, MD;  [facility]: [facility];  Service: Urology;  Laterality: Right;  Right Partial nephrectomy  . Laparotomy  12/24/2011    Procedure: EXPLORATORY LAPAROTOMY;  Surgeon: Joyice Faster. Cornett, MD;  Location: [facility];  Service: General;  Laterality: N/A;  Replacement of T-tube and Bile duct exploration  . Intraoperative cholangiogram  12/24/2011    Procedure: INTRAOPERATIVE CHOLANGIOGRAM;  Surgeon: Marcello Moores A. Cornett, MD;  Location: [facility];  Service: General;  Laterality: N/A;    reports that he quit smoking about 53 years ago. His smoking use included Cigarettes. He has a .5 pack-year smoking history. He has never used smokeless tobacco. He reports that he does not drink alcohol or use illicit drugs. family history includes Colon cancer (age of onset: 61) in his sister; Colon cancer (age of onset: 51) in his mother; Colon cancer (age of onset: 68) in his brother. There is no history of Stomach cancer. Allergies  Allergen Reactions  . Codeine Shortness Of Breath    SOB, NERVOUSNESS  . Hydrocodone Shortness Of Breath  . Oxycodone Shortness Of Breath    SOB, NERVOUSNESS  . Sulfa Antibiotics Rash      Review of Systems  Constitutional: Negative for fatigue.  Eyes: Negative for visual disturbance.  Respiratory: Negative for cough, chest tightness and shortness of breath.   Cardiovascular: Negative for chest pain, palpitations and leg swelling.  Endocrine: Negative for  polydipsia and polyuria.  Neurological: Negative for dizziness, syncope, weakness, light-headedness and headaches.       Objective:   Physical Exam  Constitutional: He appears well-developed and well-nourished.  HENT:  Mouth/Throat: Oropharynx is clear and moist.  Neck: Neck supple. No thyromegaly present.  Cardiovascular: Normal rate and regular rhythm.   Pulmonary/Chest: Effort normal and breath sounds normal. No respiratory distress. He has no wheezes. He has no rales.  Skin:  Patient has thick calluses over both great toes bilaterally. Superficial ulcers but no drainage.          Assessment & Plan:  #1 type 2 diabetes. History of poor control. Recheck A1c. Recommend regular eye exams #2 hypertension which is stable and at goal #3 dyslipidemia. Repeat lipid and hepatic panel #4 history of chronic diabetic foot ulcers. Recommend podiatry referral and we'll set this up #5 metastatic renal cell  cancer.  Followed at [facility]. #6 CKD.  Repeat BMP.

## 2013-12-03 ENCOUNTER — Ambulatory Visit: Payer: Self-pay | Admitting: Podiatry

## 2013-12-17 ENCOUNTER — Other Ambulatory Visit: Payer: Self-pay

## 2013-12-17 MED ORDER — INSULIN NPH ISOPHANE & REGULAR (70-30) 100 UNIT/ML ~~LOC~~ SUSP: SUBCUTANEOUS | Status: DC

## 2014-01-11 ENCOUNTER — Encounter: Payer: Self-pay | Admitting: Podiatry

## 2014-01-11 ENCOUNTER — Other Ambulatory Visit: Payer: Self-pay

## 2014-01-11 ENCOUNTER — Ambulatory Visit (INDEPENDENT_AMBULATORY_CARE_PROVIDER_SITE_OTHER): Payer: BC Managed Care – HMO | Admitting: Podiatry

## 2014-01-11 VITALS — Ht 73.0 in | Wt 258.0 lb

## 2014-01-11 DIAGNOSIS — G609 Hereditary and idiopathic neuropathy, unspecified: Secondary | ICD-10-CM

## 2014-01-11 DIAGNOSIS — L97509 Non-pressure chronic ulcer of other part of unspecified foot with unspecified severity: Secondary | ICD-10-CM

## 2014-01-11 DIAGNOSIS — E1169 Type 2 diabetes mellitus with other specified complication: Secondary | ICD-10-CM

## 2014-01-11 DIAGNOSIS — E1149 Type 2 diabetes mellitus with other diabetic neurological complication: Secondary | ICD-10-CM

## 2014-01-11 DIAGNOSIS — E11610 Type 2 diabetes mellitus with diabetic neuropathic arthropathy: Secondary | ICD-10-CM

## 2014-01-11 MED ORDER — ATORVASTATIN CALCIUM 40 MG PO TABS: 40.0000 mg | ORAL_TABLET | Freq: Every day | ORAL | Status: DC

## 2014-01-11 NOTE — Patient Instructions (Signed)
Seen for foot ulcer. Debrided all necrotic tissue and padded. Do dressing change daily as instructed. Return in 2 weeks.

## 2014-01-11 NOTE — Progress Notes (Signed)
Subjective: 73 year old male presents stating that he has a wound that failed to heal. Stated that he has been treated at wound care center with hyperbaric chamber to heal the wound. He also noted a new blistering lesion under instep area where bone is protruding on left foot x 3 months. This foot hurts at the blister upon ambulation. He was told that his cancer medication that might hinder any wound healing.  Now he is wearing a pair of worn out diabetic shoe with soft inserts and need a new pair.  Stated that he was diagnosed with neuropathy for 15 years and been diabetic for 30 + years.   Objective: Dermatologic:  On going open ulcer plantar surface, about 1cm in diamter right hallux duration of many years. No edema, no erythema or drainage associated with this wound. New skin breakdown and yellow and brown blister on left foot at instep area x 3 months. Mild erythema surrounding the blistering skin.  New ulceration under left hallux from old callus, cracked callus deep to subdermal layer. Vascular:  All pedal pulses are palpable. No sign of ischemic change bilateral.   Neurologic:  Has existing diagnosis of peripheral neuropathy.  Fail to respond to monofilament sensory testing.  Orthopedic:  Collapsing medial arch with painterly protruding mid tarsal complex  left foot at about Naviculocuneiform joint with associated skin break  Breakdown is limited to skin with intra dermal bleeding and fluid accumulation.  Elevated first ray with excess weight bearing on both great toes.   Assessment: None healing ulcer right hallux. New pressure ulcer left hallux and mid foot.  Charcot foot left. Abnormal biomechanics and structural failure, leading to excess weight bearing on both great toes.  Plan: Reviewed the findings and available options. Debrided all necrotic tissues and dressing using Amerigel ointment.  Applied off loading aperture pad and Buttress pad. Home care instruction  discussed. Return in 2 weeks.

## 2014-01-17 ENCOUNTER — Telehealth: Payer: Self-pay | Admitting: Family Medicine

## 2014-01-17 MED ORDER — CARVEDILOL 12.5 MG PO TABS: 12.5000 mg | ORAL_TABLET | Freq: Two times a day (BID) | ORAL | Status: DC

## 2014-01-17 NOTE — Telephone Encounter (Signed)
[facility] [place] - [address] sent re-fill request for carvedilol (COREG) 12.5 MG tablet

## 2014-01-17 NOTE — Telephone Encounter (Signed)
Rx sent to pharmacy   

## 2014-01-25 ENCOUNTER — Telehealth: Payer: Self-pay | Admitting: *Deleted

## 2014-01-25 ENCOUNTER — Ambulatory Visit (INDEPENDENT_AMBULATORY_CARE_PROVIDER_SITE_OTHER): Payer: BC Managed Care – HMO | Admitting: Podiatry

## 2014-01-25 ENCOUNTER — Encounter: Payer: Self-pay | Admitting: Podiatry

## 2014-01-25 VITALS — Ht 73.0 in | Wt 258.0 lb

## 2014-01-25 DIAGNOSIS — L97509 Non-pressure chronic ulcer of other part of unspecified foot with unspecified severity: Secondary | ICD-10-CM

## 2014-01-25 DIAGNOSIS — E1169 Type 2 diabetes mellitus with other specified complication: Secondary | ICD-10-CM

## 2014-01-25 MED ORDER — BECAPLERMIN 0.01 % EX GEL: 1.0000 "application " | Freq: Every day | CUTANEOUS | Status: DC

## 2014-01-25 NOTE — Patient Instructions (Signed)
Debrided right great toe ulcer and pad applied on both feet. Return in one week.

## 2014-01-25 NOTE — Progress Notes (Signed)
Subjective: 1 week follow up on bilateral foot ulcer. Stated that he was peeling tape and caused skin tear under the big joint left foot.   Objective: Right great toe ulcer open base remain the same under healed outer layer. Left foot mid plantar blistered lesion is dry with center open skin.  Left great toe plantar lesion is healing and decreased in size.   Assessment: Overall no measurable improvement.  Chronic right great toe ulcer no change in size. No active drainage or inflammation noted. Left foot skin lesions remain superficial under great toe and mid plantar surface.  Plan: Necrotic tissue debrided and aperture pad applied. Will try Regranex ointment if covered.

## 2014-01-25 NOTE — Telephone Encounter (Signed)
Dr. Caffie Pinto , Call from [facility], phone number [phone]. Person said the RX(Regranex)  escribed for patient isn't covered, It isn't on his Formulary she said. She also said she don't know if there is anything else similar that could be used.

## 2014-02-01 ENCOUNTER — Ambulatory Visit (INDEPENDENT_AMBULATORY_CARE_PROVIDER_SITE_OTHER): Payer: BC Managed Care – HMO | Admitting: Podiatry

## 2014-02-01 ENCOUNTER — Encounter: Payer: Self-pay | Admitting: Podiatry

## 2014-02-01 DIAGNOSIS — L97302 Non-pressure chronic ulcer of unspecified ankle with fat layer exposed: Secondary | ICD-10-CM

## 2014-02-01 DIAGNOSIS — E11610 Type 2 diabetes mellitus with diabetic neuropathic arthropathy: Secondary | ICD-10-CM

## 2014-02-01 NOTE — Patient Instructions (Signed)
Seen for bilateral foot ulcer. Shown improvement on right great toe. Left blistered lesion under mid plantar surface broke off. Follow instruction.

## 2014-02-01 NOTE — Progress Notes (Signed)
Subjective:  1 week follow up on bilateral foot ulcer. Stated that he noted of broken skin with redness on top of left foot. Kept bandage till this morning.   Objective:  Right great toe ulcer open base decreased to narrow slit with deep base.  Left foot mid plantar blistered lesion has ulcerated in center about 0.3cm.  Left great toe plantar lesion has healed off.  Assessment:  Improved right great toe ulcer with significant decrease in size. No active drainage or inflammation noted.  Left foot mid plantar surface lesion broken off skin and ulceration noted.  Charcot foot with collapsing mid arch left foot and plantar ulcer.   Plan:  Necrotic tissue debrided and aperture pad applied on both feet. Silverstat ointment dispensed with instruction.

## 2014-02-18 ENCOUNTER — Ambulatory Visit (INDEPENDENT_AMBULATORY_CARE_PROVIDER_SITE_OTHER): Payer: BC Managed Care – HMO | Admitting: Podiatry

## 2014-02-18 ENCOUNTER — Encounter: Payer: Self-pay | Admitting: Podiatry

## 2014-02-18 VITALS — BP 134/68 | HR 65

## 2014-02-18 DIAGNOSIS — E11610 Type 2 diabetes mellitus with diabetic neuropathic arthropathy: Secondary | ICD-10-CM

## 2014-02-18 DIAGNOSIS — L97509 Non-pressure chronic ulcer of other part of unspecified foot with unspecified severity: Secondary | ICD-10-CM | POA: Insufficient documentation

## 2014-02-18 DIAGNOSIS — L97502 Non-pressure chronic ulcer of other part of unspecified foot with fat layer exposed: Secondary | ICD-10-CM

## 2014-02-18 NOTE — Progress Notes (Signed)
Subjective:  2 week follow up on bilateral foot ulcer. He had two weeks of cruise trip. Stated that he followed instruction and changed bandages as scheduled.   Objective:  Right great toe ulcer open base show improvement with shallow and narrow opening. Wound is clean and dry without drainage or redness. Left foot mid plantar  lesion has ulcerated in center about 0.3cm.  No lesion on left great toe. Has skin damage on top of left foot from previous irritation with tape.   Assessment:  Improvement on right great toe ulcer with significant decrease in size. No active drainage or inflammation noted.  Left foot mid plantar surface lesion broken off skin and ulceration still remain open.  Charcot foot with collapsing mid arch left foot and plantar ulcer.   Plan:  Necrotic tissue debrided and aperture pad applied on both feet.  Continue with Silverstat ointment dressing.

## 2014-02-18 NOTE — Patient Instructions (Signed)
Continue with daily dressing change as instructed.

## 2014-02-25 ENCOUNTER — Ambulatory Visit (INDEPENDENT_AMBULATORY_CARE_PROVIDER_SITE_OTHER): Payer: Medicare Other | Admitting: Family Medicine

## 2014-02-25 ENCOUNTER — Encounter: Payer: Self-pay | Admitting: Podiatry

## 2014-02-25 ENCOUNTER — Ambulatory Visit (INDEPENDENT_AMBULATORY_CARE_PROVIDER_SITE_OTHER): Payer: BC Managed Care – HMO | Admitting: Podiatry

## 2014-02-25 ENCOUNTER — Encounter: Payer: Self-pay | Admitting: Family Medicine

## 2014-02-25 VITALS — BP 130/68 | HR 68 | Ht 73.0 in | Wt 251.0 lb

## 2014-02-25 VITALS — BP 130/70 | HR 68 | Temp 97.5°F | Wt 251.0 lb

## 2014-02-25 DIAGNOSIS — Z794 Long term (current) use of insulin: Secondary | ICD-10-CM

## 2014-02-25 DIAGNOSIS — E11621 Type 2 diabetes mellitus with foot ulcer: Secondary | ICD-10-CM

## 2014-02-25 DIAGNOSIS — E119 Type 2 diabetes mellitus without complications: Secondary | ICD-10-CM

## 2014-02-25 DIAGNOSIS — L97511 Non-pressure chronic ulcer of other part of right foot limited to breakdown of skin: Secondary | ICD-10-CM

## 2014-02-25 DIAGNOSIS — L97519 Non-pressure chronic ulcer of other part of right foot with unspecified severity: Secondary | ICD-10-CM

## 2014-02-25 LAB — HEMOGLOBIN A1C: HEMOGLOBIN A1C: 7.1 % — AB (ref 4.6–6.5)

## 2014-02-25 MED ORDER — AMOXICILLIN-POT CLAVULANATE 875-125 MG PO TABS
1.0000 | ORAL_TABLET | Freq: Two times a day (BID) | ORAL | Status: AC
Start: 2014-02-25 — End: 2014-03-07

## 2014-02-25 NOTE — Patient Instructions (Signed)
Noted of new blister on 3-4 digit right foot. Follow instruction for home care. Return in one week.

## 2014-02-25 NOTE — Progress Notes (Signed)
Subjective:  1 week follow up on bilateral foot ulcer. Stated that had bandages too tight and caused blisters on toes.   Objective:  Right great toe ulcer open base continue to show improvement with shallow and narrow opening.  Wound is clean and dry without drainage or redness.  Left foot mid plantar lesion has dried out.  New skin damage on right lateral aspect and 3rd and 4th digit right showing raw skin . Assessment:  Continue to shoe Improvement on right great toe ulcer with significant decrease in size. Now it is a small slit about 0.8cm long.  No active drainage or inflammation noted.  Charcot foot with collapsing mid arch left foot and plantar ulcer has healed. New abrased skin lesions on right foot from tight bandage, lateral aspect, 3rd and 4th digit.   Plan:  Necrotic tissue debrided and aperture pad applied on right foot. Continue with Silverstat ointment dressing as instructed.

## 2014-02-25 NOTE — Progress Notes (Signed)
Subjective:    Patient ID: Frank Ashley, male    DOB: [birth year], 73 y.o.   MRN: [id number]  HPI 73 year old presents today for 3 month check for diabetes.  Patient states that his sugars have been well controlled at home.  No episodes of hypoglycemia with the insulin injections.  Has not had his vision examined, but states that he will schedule an appointment for that.  He is seeing a podiatrist for his diabetic foot ulcers.  He states that several have finally started to heel.  Patient reports that "a bone in his foot has dropped.  They are wrapping the foot to keep sufficient padding.  Due to the wrap and pressure on the toes, ulcerations of the skin have now formed."  The skin has rubbed on the right foot between the 3-4 and 4-5 toes.  He has noted bleeding, pus drainage and rank scents.  Denies pain, but has neuropathy to the feet.     Review of Systems  Constitutional: Negative for fever, activity change, appetite change and fatigue.  Respiratory: Negative for cough, chest tightness, shortness of breath and wheezing.   Cardiovascular: Negative for chest pain.  Genitourinary: Negative.   Musculoskeletal: Negative for gait problem.  Skin: Positive for wound. Negative for color change.       Several ulcers have healed, while a few are still being treated by the podiatrist.    Neurological: Negative for weakness and numbness.   Past Medical History  Diagnosis Date  . Hyperlipidemia   . Renal mass, right   . Diabetes mellitus type 2, insulin dependent   . Diabetic foot ulcer CURRENT RIGHT GREAT TOE WOUND W/ DRAINAGE --  DRESSING CHANGE EVERY OTHER DAY  . History of acute pancreatitis 2008  . OSA (obstructive sleep apnea) CPAP USED UP UNTIL 2 YRS AGO  WHEN MASK WAS DAMAGED   . Renal insufficiency   . GERD (gastroesophageal reflux disease)   . Peripheral neuropathy   . Left leg numbness COMPLETE NUMBNESS FROM HIP DOWN TO FOOT  . Numbness in right leg FROM KNEE DOWN TO FOOT  .  History of kidney stones AGE 54  . BPH (benign prostatic hypertrophy)   . Choledocholithiasis with chronic cholecystitis     BILE DUCT STONES AND GALLSTONES  . History of gout STABLE PER PT  . Arthritis HANDS  . History of MRSA infection 10 YRS AGO /  SUPERFICIAL SKIN AREA  . Generalized rash RIGHT SHOULDER/ BACK AREA  . Nocturia   . Frequency of urination   . Hematuria     YRS AGO  . Hypertension   . History of peptic ulcer disease   . CAD (coronary artery disease)     Circumflex stent 1996  //  catheterization January, 2000, 50-60% mid LAD,  dominant circumflex-50% in-stent restenosis similar to a cath in 1999, RCA small and nondominant,, EF 60%  . Complication of anesthesia clostraphobic  . Preop cardiovascular exam     Cardiac clearance for nephrectomy and cholecystectomy June, 2013  . Ejection fraction     EF 60%, catheterization, 2000  //   EF 55-60%, echo, July, 2013  . Anxiety     SINCE MAY 2013 AND DIAGNOSIS OF CANCER-PT WAS HAVING PANIC ATTACKS-FELT LIKE HE COULDN'T BREATHE--STATES HE IS FEELING BETTER ON ALPRAZOLAM  . Cancer     CANCER IN RIGHT KIDNEY-SURGERY PLANNED  . Acute pancreatitis 03/12/2010    Qualifier: Diagnosis of  By: Frank Hashimoto MD, Frank Ashley    .  DIABETIC FOOT ULCER, TOE 08/01/2008    Qualifier: Diagnosis of  By: Frank Hashimoto MD, Frank Ashley     Current Outpatient Prescriptions on File Prior to Visit  Medication Sig Dispense Refill  . amLODipine (NORVASC) 10 MG tablet Take 10 mg by mouth daily.       [name] aspirin 325 MG EC tablet Take 975 mg by mouth 2 (two) times daily.       [name] atorvastatin (LIPITOR) 40 MG tablet Take 1 tablet (40 mg total) by mouth daily.  90 tablet  2  . becaplermin (REGRANEX) 0.01 % gel Apply 1 application topically daily.  15 g  0  . carvedilol (COREG) 12.5 MG tablet Take 1 tablet (12.5 mg total) by mouth 2 (two) times daily with a meal.  60 tablet  3  . fenofibrate 160 MG tablet TAKE ONE TABLET BY MOUTH EVERY DAY  90 tablet  3  . gabapentin  (NEURONTIN) 800 MG tablet TAKE ONE TABLET  BY MOUTH FIVE TIMES DAILY. NEEDS OFFICE VISIT  450 tablet  3  . glucose blood (TRUETRACK TEST) test strip USE ONE STRIP TO CHECK GLUCOSE THREE TIMES DAILY       DX 250.00  100 each  3  . glucose blood (TRUETRACK TEST) test strip USE ONE STRIP TO CHECK GLUCOSE THREE TIMES DAILY. DX:250.00  100 each  3  . insulin NPH-regular Human (HUMULIN 70/30) (70-30) 100 UNIT/ML injection 80 units in the AM, 70 units in the PM  140 mL  3  . LORazepam (ATIVAN) 0.5 MG tablet Take 2 tablets (1 mg total) by mouth every 6 (six) hours as needed for anxiety.  120 tablet  2  . omeprazole (PRILOSEC) 40 MG capsule Take 40 mg by mouth 2 (two) times daily.      [name] oxiconazole (OXISTAT) 1 % CREA Apply 1 application topically as needed. For SKIN RASHES  60 each  0  . pazopanib (VOTRIENT) 200 MG tablet Take 4 tablets a day      . traMADol (ULTRAM) 50 MG tablet Take 1 tablet (50 mg total) by mouth every 6 (six) hours as needed.  30 tablet  0  . triamcinolone cream (KENALOG) 0.1 % Apply topically 2 (two) times daily. Compound with Eucerin 1:1 and apply to affected area bid prn itching  454 g  1  . zolpidem (AMBIEN) 10 MG tablet Take 10 mg by mouth at bedtime as needed for sleep.       No current facility-administered medications on file prior to visit.   Family History  Problem Relation Age of Onset  . Colon cancer Mother 73  . Colon cancer Sister 85  . Colon cancer Brother 15  . Stomach cancer Neg Hx    History   Social History  . Marital Status: Married    Spouse Name: N/A    Number of Children: N/A  . Years of Education: N/A   Occupational History  . Not on file.   Social History Main Topics  . Smoking status: Former Smoker -- 0.25 packs/day for 2 years    Types: Cigarettes    Quit date: 02/25/1960  . Smokeless tobacco: Never Used  . Alcohol Use: No  . Drug Use: No  . Sexual Activity: Not on file   Other Topics Concern  . Not on file   Social History Narrative    Lives at home with wife Frank Ashley. Has 4 grown children and 13 grandchildren. Doesn't smoke, no drugs, no alcohol.  Objective:   Physical Exam  Nursing note and vitals reviewed. Constitutional: He is oriented to person, place, and time. He appears well-developed and well-nourished. No distress.  Cardiovascular: Normal rate, regular rhythm, normal heart sounds and intact distal pulses.   No murmur heard. Pulmonary/Chest: Effort normal and breath sounds normal. No respiratory distress. He has no wheezes.  Musculoskeletal: Normal range of motion.  Neurological: He is alert and oriented to person, place, and time.  Skin: Skin is warm. He is not diaphoretic.  Several ulcerations on bilateral feet are healing.  Right lateral foot with ulceration.  Right open wound between 3-4 and 4-5 toes.  Slight erythema to the toe, which looks like a healing processes versus a cellulitis.  Skin is lifted away and fresh tissue is visible beneath without granulation.  Positive for drainage from the wound.          Assessment & Plan:  Diabetes Type II- well controlled.  Check A1C today.  Continue current medications and home monitoring.  Continue wound management plan as developed by the podiatrist.  Started on augmentin 875-125 mg for diabetic ulcer that has shown signs of infection such as bleeding, pus drainage and foul smell.  This is the medication that he has responded well to in the past.  Need to have a low threshold to treat him.    Joseph Art, PA-S  Agree with above.  Only mild erythema of right fourth toe and no visible purulent drainage at this time. Start Augmentin and he has follow up with podiatry later today.  Forms completed for diabetic shoes. Frank Ashley Burchette M.D.

## 2014-02-25 NOTE — Progress Notes (Signed)
Pre visit review using our clinic review tool, if applicable. No additional management support is needed unless otherwise documented below in the visit note. 

## 2014-03-04 ENCOUNTER — Ambulatory Visit (INDEPENDENT_AMBULATORY_CARE_PROVIDER_SITE_OTHER): Payer: BC Managed Care – HMO | Admitting: Podiatry

## 2014-03-04 ENCOUNTER — Encounter: Payer: Self-pay | Admitting: Podiatry

## 2014-03-04 VITALS — BP 147/71 | HR 61

## 2014-03-04 DIAGNOSIS — E11610 Type 2 diabetes mellitus with diabetic neuropathic arthropathy: Secondary | ICD-10-CM

## 2014-03-04 DIAGNOSIS — L97511 Non-pressure chronic ulcer of other part of right foot limited to breakdown of skin: Secondary | ICD-10-CM

## 2014-03-04 NOTE — Progress Notes (Signed)
Subjective:  1 week follow up on bilateral foot ulcer. Stated that he had difficulty bandaging right foot.   Objective:  Right great toe ulcer open base continue to show improvement with shallow and narrow opening, about 0.2 x 0.3 cm opening.  Left foot mid plantar lesion still show subdermal bleeding. Left great toe has pin point opening under the sulcus. New skin damage on right lateral aspect and 3rd and 4th digit right has regressed with inflamed skin and yellow necrotic skin on 3rd digit lateral aspect at contact space with 4th digit.   Assessment:  Continue to shoe Improvement on right great toe ulcer with significant decrease in size. No active drainage or inflammation noted.  Charcot foot with collapsing mid arch left foot and plantar ulcer has healed. New abrased skin lesions on right foot inflamed due to tight toe pressure 3rd and 4th digit.  Plan:  Necrotic tissue debrided and aperture pad applied to all lesions, right and left great toes, under the arch of left foot, and inter digital space 3rd and 4th digit right foot. Surgical shoe dispensed. Return in one week for dressing change.

## 2014-03-04 NOTE — Patient Instructions (Signed)
Seen for ulcerated foot. Right great toe still improving. Return in one week.

## 2014-03-11 ENCOUNTER — Ambulatory Visit (INDEPENDENT_AMBULATORY_CARE_PROVIDER_SITE_OTHER): Payer: BC Managed Care – HMO | Admitting: Podiatry

## 2014-03-11 ENCOUNTER — Encounter: Payer: Self-pay | Admitting: Podiatry

## 2014-03-11 DIAGNOSIS — M2041 Other hammer toe(s) (acquired), right foot: Secondary | ICD-10-CM | POA: Insufficient documentation

## 2014-03-11 DIAGNOSIS — L97511 Non-pressure chronic ulcer of other part of right foot limited to breakdown of skin: Secondary | ICD-10-CM

## 2014-03-11 MED ORDER — AMOXICILLIN-POT CLAVULANATE 875-125 MG PO TABS: 1.0000 | ORAL_TABLET | Freq: Two times a day (BID) | ORAL | Status: DC

## 2014-03-11 NOTE — Progress Notes (Signed)
Subjective:  1 week follow up on bilateral foot ulcer. He is concerned because he had more pain and drainage on gauze on 4th digit right.  Objective:  Right great toe ulcer open base continue to show improvement with shallow and narrow opening.  Wound is clean and dry without drainage or redness.  Left foot mid plantar lesion has dried out.  Contracted digits with skin lesions right 4th. On right foot lesser digital lesion show improvement on 3rd digit and dry lateral 5th metatarsal shaft area. 4th digit has no change with necrotic epidermal layer and sluffing skin layer at contact surface with 5th digit.   Assessment:  Continue to shoe Improvement on right great toe ulcer with significant decrease in size. Now it is a small round spot about 0.3cm in diameter. No active drainage or inflammation noted.  Charcot foot with collapsing mid arch left foot and plantar ulcer has healed. 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   Plan:  Cleansed and applied aperture pad on 3rd digit right foot with home care instruction with Iodine. Maintain aperture pads on all other pads.  Rx. Augmentin 875 mg bid #20. Written in rx paper.

## 2014-03-11 NOTE — Patient Instructions (Signed)
Seen for painful skin lesion right. All debrided and padded. Return in one week.

## 2014-03-25 ENCOUNTER — Ambulatory Visit: Payer: BC Managed Care – HMO | Admitting: Podiatry

## 2014-03-26 ENCOUNTER — Telehealth: Payer: Self-pay | Admitting: Family Medicine

## 2014-03-26 ENCOUNTER — Encounter: Payer: Self-pay | Admitting: Podiatry

## 2014-03-26 ENCOUNTER — Ambulatory Visit (INDEPENDENT_AMBULATORY_CARE_PROVIDER_SITE_OTHER): Payer: BC Managed Care – HMO | Admitting: Podiatry

## 2014-03-26 VITALS — BP 124/75 | HR 55

## 2014-03-26 DIAGNOSIS — L97501 Non-pressure chronic ulcer of other part of unspecified foot limited to breakdown of skin: Secondary | ICD-10-CM

## 2014-03-26 DIAGNOSIS — E11610 Type 2 diabetes mellitus with diabetic neuropathic arthropathy: Secondary | ICD-10-CM

## 2014-03-26 DIAGNOSIS — M2041 Other hammer toe(s) (acquired), right foot: Secondary | ICD-10-CM

## 2014-03-26 NOTE — Telephone Encounter (Signed)
[facility], [place] - [address] [phone] is requesting re-fill on LORazepam (ATIVAN) 0.5 MG tablet

## 2014-03-26 NOTE — Patient Instructions (Signed)
Improving foot ulcer. Follow instruction and return in one week.

## 2014-03-26 NOTE — Progress Notes (Signed)
Subjective:  2 week follow up on bilateral foot ulcer.   Objective:  Right great toe ulcer has healed off with dry callus formation at ulcer site. No opening under callus noted. Left foot mid plantar lesion still has pin point opening with minute drainage. Protruding mid tarsal bone with plantar ulcer formation on left foot, slow healing.  Contracted digits with macerated and oozing skin surface lateral 1/3 of 4th digit right with pressure from 5th digit. On right foot lesser digital lesion show improvement on 3rd digit and dry lateral 5th metatarsal shaft area.  Assessment:  Complete healing of chronic ulcer right great toe. Slow healing plantar ulcer from Charcot foot left.  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    Plan:  All callused area debrided and padded.  Cleansed and applied aperture pad for 4th digit right foot with home care instruction. Maintain aperture pad in between 4th and 5th digit right.

## 2014-03-26 NOTE — Telephone Encounter (Signed)
Last visit 02/25/14 Last refill 10/16/13 #120 2 refill

## 2014-03-27 MED ORDER — LORAZEPAM 0.5 MG PO TABS: 0.5000 mg | ORAL_TABLET | Freq: Three times a day (TID) | ORAL | Status: DC | PRN

## 2014-03-27 NOTE — Telephone Encounter (Signed)
Rx changed and called into pharmacy  

## 2014-03-27 NOTE — Telephone Encounter (Signed)
i would like for him to scale back to no more than one po TID prn #90 with 2 refills.

## 2014-04-02 ENCOUNTER — Encounter: Payer: Self-pay | Admitting: Podiatry

## 2014-04-02 ENCOUNTER — Ambulatory Visit (INDEPENDENT_AMBULATORY_CARE_PROVIDER_SITE_OTHER): Payer: BC Managed Care – HMO | Admitting: Podiatry

## 2014-04-02 DIAGNOSIS — L97501 Non-pressure chronic ulcer of other part of unspecified foot limited to breakdown of skin: Secondary | ICD-10-CM

## 2014-04-02 DIAGNOSIS — E11610 Type 2 diabetes mellitus with diabetic neuropathic arthropathy: Secondary | ICD-10-CM

## 2014-04-02 NOTE — Progress Notes (Signed)
Subjective:  1 week follow up on bilateral foot ulcer.   Objective:  Right great toe ulcer has replaced bled callus. Dry and no opening.  Left foot mid plantar lesion has loss of skin about 0.2 x 1cm.  Protruding mid tarsal bone with plantar ulcer formation on left foot, slow healing.  Macerated 4th digit right is dry and covered with hard scab.  Shoe liner examined and noted need for improvement, need indentation on mid foot ulcer site.   Assessment:  Complete healing of chronic ulcer right great toe. Slow healing plantar ulcer from Charcot foot left.  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    Plan:  All callused area debrided and padded.  Left shoe liner modified to form a crate to accommodate subluxed midtarsal bone and ulcerated midfoot.

## 2014-04-02 NOTE — Patient Instructions (Signed)
Ulcer progressing well. Modified shoe liner. Return in 2 weeks.

## 2014-04-16 ENCOUNTER — Ambulatory Visit (INDEPENDENT_AMBULATORY_CARE_PROVIDER_SITE_OTHER): Payer: BC Managed Care – HMO | Admitting: Podiatry

## 2014-04-16 ENCOUNTER — Encounter: Payer: Self-pay | Admitting: Podiatry

## 2014-04-16 VITALS — BP 158/75 | HR 60

## 2014-04-16 DIAGNOSIS — L97501 Non-pressure chronic ulcer of other part of unspecified foot limited to breakdown of skin: Secondary | ICD-10-CM

## 2014-04-16 DIAGNOSIS — E11610 Type 2 diabetes mellitus with diabetic neuropathic arthropathy: Secondary | ICD-10-CM

## 2014-04-16 NOTE — Patient Instructions (Signed)
Seen for diabetic foot ulcer. Keep pad on left foot. Return in one week.

## 2014-04-16 NOTE — Progress Notes (Signed)
Subjective:  2 week follow up on bilateral foot ulcer.  Patient is wearing a new pair of diabetic shoes.   Objective:  Right great toe ulcer has replaced bled callus. Dry and no opening.  Left foot mid plantar lesion has loss of skin about 0.2 x 1cm with trace of drainage in white sock.   Protruding mid tarsal bone with plantar ulcer formation on left foot, slow healing.  Macerated 4th digit right decreasing in size with hard scab.   Assessment:  Complete healing of chronic ulcer right great toe. Slow healing plantar ulcer from Charcot foot left.  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    Plan:  All callused area debrided and padded.  Left mid foot area padded.

## 2014-04-20 ENCOUNTER — Encounter: Payer: Self-pay | Admitting: Family Medicine

## 2014-04-23 ENCOUNTER — Ambulatory Visit (INDEPENDENT_AMBULATORY_CARE_PROVIDER_SITE_OTHER): Payer: BC Managed Care – HMO | Admitting: Podiatry

## 2014-04-23 ENCOUNTER — Encounter: Payer: Self-pay | Admitting: Podiatry

## 2014-04-23 DIAGNOSIS — E11610 Type 2 diabetes mellitus with diabetic neuropathic arthropathy: Secondary | ICD-10-CM

## 2014-04-23 DIAGNOSIS — L97501 Non-pressure chronic ulcer of other part of unspecified foot limited to breakdown of skin: Secondary | ICD-10-CM

## 2014-04-23 NOTE — Progress Notes (Signed)
Subjective:  1 week follow up on bilateral foot ulcer.  Stated that he has noted a stain on socks from the left foot wound.   Objective:  Right great toe ulcer has completely healed without excess callus.  Left foot mid plantar lesion has opening with loss of skin about 0.2 x 1cm with trace of drainage in white sock.  Protruding mid tarsal bone with plantar ulcer formation on left foot, slow healing.  Macerated 4th digit right decreasing in size with hard scab.   Assessment:  Complete healing of chronic ulcer right great toe. Slow healing plantar ulcer from Charcot foot left.  Hammer toe 4th right with dry healing skin still improving. .    Plan:  Left foot plantar callused area debrided and padded.

## 2014-04-23 NOTE — Patient Instructions (Signed)
Seen for left foot ulcer on left foot. Return in 2 weeks.

## 2014-05-07 ENCOUNTER — Ambulatory Visit: Payer: BC Managed Care – HMO | Admitting: Podiatry

## 2014-05-14 ENCOUNTER — Ambulatory Visit: Payer: Self-pay | Admitting: Podiatry

## 2014-05-16 ENCOUNTER — Encounter (HOSPITAL_COMMUNITY): Payer: Self-pay | Admitting: General Surgery

## 2014-05-20 ENCOUNTER — Telehealth: Payer: Self-pay

## 2014-05-20 NOTE — Telephone Encounter (Signed)
Last visit 02/25/14 Last refill 10/16/13 #30 0 refill   Tramadol  [facility]

## 2014-05-20 NOTE — Telephone Encounter (Signed)
Refill okay?  

## 2014-05-21 ENCOUNTER — Ambulatory Visit (INDEPENDENT_AMBULATORY_CARE_PROVIDER_SITE_OTHER): Payer: Medicare Other | Admitting: Podiatry

## 2014-05-21 ENCOUNTER — Other Ambulatory Visit: Payer: Self-pay

## 2014-05-21 ENCOUNTER — Encounter: Payer: Self-pay | Admitting: Podiatry

## 2014-05-21 VITALS — BP 141/67 | HR 68

## 2014-05-21 DIAGNOSIS — E11610 Type 2 diabetes mellitus with diabetic neuropathic arthropathy: Secondary | ICD-10-CM

## 2014-05-21 DIAGNOSIS — L97501 Non-pressure chronic ulcer of other part of unspecified foot limited to breakdown of skin: Secondary | ICD-10-CM

## 2014-05-21 DIAGNOSIS — M2041 Other hammer toe(s) (acquired), right foot: Secondary | ICD-10-CM

## 2014-05-21 MED ORDER — CARVEDILOL 12.5 MG PO TABS: 12.5000 mg | ORAL_TABLET | Freq: Two times a day (BID) | ORAL | Status: DC

## 2014-05-21 MED ORDER — TRAMADOL HCL 50 MG PO TABS: 50.0000 mg | ORAL_TABLET | Freq: Four times a day (QID) | ORAL | Status: DC | PRN

## 2014-05-21 NOTE — Patient Instructions (Signed)
Seen for left foot ulcer. Right foot has improved. Keep the wound covered with dressing. Return in 2 weeks.

## 2014-05-21 NOTE — Telephone Encounter (Signed)
Rx request for carvedilol 12.5 mg tablet- Take 1 tablet by mouth twice daily with a meal #60  Pharm:  [facility]  Rx sent to pharmacy.

## 2014-05-21 NOTE — Telephone Encounter (Signed)
Rx called in to pharmacy. 

## 2014-05-21 NOTE — Progress Notes (Signed)
Subjective:  4 weeks since last visit due to a death in family.   Objective:  Right great toe ulcer site has build up callus with intradermal bleeding without drainage or broken skin.  Left foot mid plantar lesion has opening with loss of skin about 1.2 x 1cm with callused skin over the open wound.  Protruding mid tarsal bone with plantar ulcer formation on left foot, slow healing.  Macerated 4th digit right decreasing in size with hard scab.   Assessment:  Complete healing of chronic ulcer right great toe. Slow healing plantar ulcer from Charcot foot left.  Hammer toe 4th right with dry healing skin still improving. .    Plan:  Left foot plantar callused area debrided and padded.  Right foot 4th digit lesion dressed with Amerigel with aperture pad in between digit.

## 2014-06-04 ENCOUNTER — Ambulatory Visit (INDEPENDENT_AMBULATORY_CARE_PROVIDER_SITE_OTHER): Payer: Medicare Other | Admitting: Podiatry

## 2014-06-04 ENCOUNTER — Encounter: Payer: Self-pay | Admitting: Podiatry

## 2014-06-04 DIAGNOSIS — L97501 Non-pressure chronic ulcer of other part of unspecified foot limited to breakdown of skin: Secondary | ICD-10-CM

## 2014-06-04 DIAGNOSIS — E11610 Type 2 diabetes mellitus with diabetic neuropathic arthropathy: Secondary | ICD-10-CM

## 2014-06-04 NOTE — Patient Instructions (Signed)
Seen for ulcer left foot.  Slow healing wound. Use new shoe liner for the next 2 weeks.  Return in 2 weeks.

## 2014-06-04 NOTE — Progress Notes (Signed)
Right great toe wound had healed completely. Left foot instep arch area ulcer remain the same.  Assessment: Charcot foot with prolapsing C-N complex with plantar ulcer.  Plan:  Area debrided. New shoe liner with aperture pad dispensed. Patient is to stay in shoe with aperture padded shoe liner during the day for the next 2 weeks.

## 2014-06-18 ENCOUNTER — Encounter: Payer: Self-pay | Admitting: Podiatry

## 2014-06-18 ENCOUNTER — Ambulatory Visit (INDEPENDENT_AMBULATORY_CARE_PROVIDER_SITE_OTHER): Payer: Medicare Other | Admitting: Podiatry

## 2014-06-18 VITALS — BP 138/96 | HR 64

## 2014-06-18 DIAGNOSIS — L97502 Non-pressure chronic ulcer of other part of unspecified foot with fat layer exposed: Secondary | ICD-10-CM

## 2014-06-18 DIAGNOSIS — E11610 Type 2 diabetes mellitus with diabetic neuropathic arthropathy: Secondary | ICD-10-CM

## 2014-06-18 NOTE — Patient Instructions (Signed)
Seen for left foot ulcer. Healing slow and got deeper. Will do weekly visit.  Return in one week.

## 2014-06-18 NOTE — Progress Notes (Signed)
Follow up on left foot ulcer. Center part of the lesion has deepened without increasing is size.  No redness or active drianage. Aperture pad applied. Instructed to do daily wound care with Silverstat.

## 2014-06-21 ENCOUNTER — Other Ambulatory Visit: Payer: Self-pay | Admitting: Family Medicine

## 2014-06-25 ENCOUNTER — Ambulatory Visit (INDEPENDENT_AMBULATORY_CARE_PROVIDER_SITE_OTHER): Payer: Medicare Other | Admitting: Podiatry

## 2014-06-25 ENCOUNTER — Encounter: Payer: Self-pay | Admitting: Podiatry

## 2014-06-25 VITALS — BP 136/69 | HR 59

## 2014-06-25 DIAGNOSIS — L97502 Non-pressure chronic ulcer of other part of unspecified foot with fat layer exposed: Secondary | ICD-10-CM

## 2014-06-25 DIAGNOSIS — E11610 Type 2 diabetes mellitus with diabetic neuropathic arthropathy: Secondary | ICD-10-CM

## 2014-06-25 NOTE — Progress Notes (Signed)
1 week follow up on left foot ulcer. Stated that he did daily dressing change with Silverstat.  Center part of the lesion appear to have decreased in size and in depth.  No redness or active drianage. Aperture pad applied.  Patient is to remove the pad the day before the next appointment. Instructed to do daily wound care with Silverstat.

## 2014-06-25 NOTE — Patient Instructions (Signed)
Left foot ulcer healing slow. Remove padding the day before the next appointment. Return in one week.

## 2014-06-28 ENCOUNTER — Ambulatory Visit (INDEPENDENT_AMBULATORY_CARE_PROVIDER_SITE_OTHER): Payer: Medicare Other | Admitting: Family Medicine

## 2014-06-28 ENCOUNTER — Encounter: Payer: Self-pay | Admitting: Family Medicine

## 2014-06-28 VITALS — BP 124/70 | HR 64 | Temp 97.4°F | Wt 242.0 lb

## 2014-06-28 DIAGNOSIS — E1142 Type 2 diabetes mellitus with diabetic polyneuropathy: Secondary | ICD-10-CM

## 2014-06-28 DIAGNOSIS — E1140 Type 2 diabetes mellitus with diabetic neuropathy, unspecified: Secondary | ICD-10-CM

## 2014-06-28 DIAGNOSIS — E785 Hyperlipidemia, unspecified: Secondary | ICD-10-CM

## 2014-06-28 DIAGNOSIS — E119 Type 2 diabetes mellitus without complications: Secondary | ICD-10-CM

## 2014-06-28 DIAGNOSIS — Z794 Long term (current) use of insulin: Secondary | ICD-10-CM

## 2014-06-28 DIAGNOSIS — F419 Anxiety disorder, unspecified: Secondary | ICD-10-CM

## 2014-06-28 LAB — HM DIABETES EYE EXAM

## 2014-06-28 LAB — HEMOGLOBIN A1C: HEMOGLOBIN A1C: 7.9 % — AB (ref 4.6–6.5)

## 2014-06-28 MED ORDER — LORAZEPAM 0.5 MG PO TABS: 0.5000 mg | ORAL_TABLET | Freq: Four times a day (QID) | ORAL | Status: DC | PRN

## 2014-06-28 NOTE — Progress Notes (Signed)
Subjective:    Patient ID: Frank Ashley, male    DOB: July 31, 1940, 74 y.o.   MRN: [id number]  HPI Patient seen for medical follow-up. He has history of obesity, renal cell carcinoma metastatic, type 2 diabetes with peripheral neuropathy, GERD, hyperlipidemia, hypertension.  He is followed at [facility] and is seen there every 2 weeks and is still treated with Votrient. He's had some side effects with diarrhea but is tolerating fairly well. He is followed regularly by podiatrist. His chronic toe ulcer is finally healed. His blood sugars have been relatively stable. Last A1c 7.1% back in October. Denies any recent hypoglycemia. He is slowly lost some weight in recent months and appetite is slightly diminished. He remains on atorvastatin for hyperlipidemia. Lipids were stable last fall and affect his cholesterol is very low at 90. Blood pressure been stable. No orthostasis.  He's had some severe anxiety since diagnosis of his cancer. He takes lorazepam and we have encouraged him try to scalp this back if possible  Past Medical History  Diagnosis Date  . Hyperlipidemia   . Renal mass, right   . Diabetes mellitus type 2, insulin dependent   . Diabetic foot ulcer CURRENT RIGHT GREAT TOE WOUND W/ DRAINAGE --  DRESSING CHANGE EVERY OTHER DAY  . History of acute pancreatitis 2008  . OSA (obstructive sleep apnea) CPAP USED UP UNTIL 2 YRS AGO  WHEN MASK WAS DAMAGED   . Renal insufficiency   . GERD (gastroesophageal reflux disease)   . Peripheral neuropathy   . Left leg numbness COMPLETE NUMBNESS FROM HIP DOWN TO FOOT  . Numbness in right leg FROM KNEE DOWN TO FOOT  . History of kidney stones AGE 74  . BPH (benign prostatic hypertrophy)   . Choledocholithiasis with chronic cholecystitis     BILE DUCT STONES AND GALLSTONES  . History of gout STABLE PER PT  . Arthritis HANDS  . History of MRSA infection 10 YRS AGO /  SUPERFICIAL SKIN AREA  . Generalized rash RIGHT SHOULDER/ BACK AREA  .  Nocturia   . Frequency of urination   . Hematuria     YRS AGO  . Hypertension   . History of peptic ulcer disease   . CAD (coronary artery disease)     Circumflex stent 1996  //  catheterization January, 2000, 50-60% mid LAD,  dominant circumflex-50% in-stent restenosis similar to a cath in 1999, RCA small and nondominant,, EF 60%  . Complication of anesthesia clostraphobic  . Preop cardiovascular exam     Cardiac clearance for nephrectomy and cholecystectomy June, 2013  . Ejection fraction     EF 60%, catheterization, 2000  //   EF 55-60%, echo, July, 2013  . Anxiety     SINCE MAY 2013 AND DIAGNOSIS OF CANCER-PT WAS HAVING PANIC ATTACKS-FELT LIKE HE COULDN'T BREATHE--STATES HE IS FEELING BETTER ON ALPRAZOLAM  . Cancer     CANCER IN RIGHT KIDNEY-SURGERY PLANNED  . Acute pancreatitis 03/12/2010    Qualifier: Diagnosis of  By: Elease Hashimoto MD, Bruce    . DIABETIC FOOT ULCER, TOE 08/01/2008    Qualifier: Diagnosis of  By: Elease Hashimoto MD, Bruce     Past Surgical History  Procedure Laterality Date  . Penile prosthesis implant  2008    REMOVED 2010 DUE TO MALFUNCTION  . Esophagogastroduodenoscopy  09/16/2011    Procedure: ESOPHAGOGASTRODUODENOSCOPY (EGD);  Surgeon: Missy Sabins, MD;  Location: [facility];  Service: Endoscopy;  Laterality: N/A;  . Ct perc cholecystostomy  09-17-2011  . Right great toe surg.  2000  . Surg for repair of perferated peptic ulcer  AGE 28  . Bilroth ii procedure  AGE 28    2 MONTHS LATER,  BILROTH II RECONSTRUCTION  . Coronary angioplasty with stent placement  1996    STENTING X1 CIRCUMFLEX  . Cardiac catheterization  05-04-1998--- REPORT W/ CHART    50%  IN-STENT RESTENOSIS OF CIRCUMFLEX/ 20% LEFT MAIN/ 50-60% LAD/ RCA NORMAL/ LV NORMAL / EF 60%  . Cataract extraction w/ intraocular lens  implant, bilateral  2005  . Cystoscopy/retrograde/ureteroscopy  10/26/2011    Procedure: CYSTOSCOPY/RETROGRADE/URETEROSCOPY;  Surgeon: Fredricka Bonine, MD;  Location:  [facility];  Service: Urology;  Laterality: N/A;  RIGHT URETEROSCOPY AND RIGHT RETROGRADE PYELOGRAM  DIABETIC C ARM   . Removal of penile prosthesis    . Cholecystectomy  12/20/2011    Procedure: CHOLECYSTECTOMY WITH COMMON DUCT EXPLORATION;  Surgeon: Marcello Moores A. Cornett, MD;  Location: [facility];  Service: General;  Laterality: N/A;  choloductoscopy  . Intraoperative cholangiogram  12/20/2011    Procedure: INTRAOPERATIVE CHOLANGIOGRAM;  Surgeon: Marcello Moores A. Cornett, MD;  Location: [facility];  Service: General;;  . Partial nephrectomy  12/20/2011    Procedure: NEPHRECTOMY PARTIAL;  Surgeon: Fredricka Bonine, MD;  Location: [facility];  Service: Urology;  Laterality: Right;  Right Partial nephrectomy  . Laparotomy  12/24/2011    Procedure: EXPLORATORY LAPAROTOMY;  Surgeon: Joyice Faster. Cornett, MD;  Location: [facility];  Service: General;  Laterality: N/A;  Replacement of T-tube and Bile duct exploration  . Intraoperative cholangiogram  12/24/2011    Procedure: INTRAOPERATIVE CHOLANGIOGRAM;  Surgeon: Marcello Moores A. Cornett, MD;  Location: [facility];  Service: General;  Laterality: N/A;    reports that he quit smoking about 54 years ago. His smoking use included Cigarettes. He has a .5 pack-year smoking history. He has never used smokeless tobacco. He reports that he does not drink alcohol or use illicit drugs. family history includes Colon cancer (age of onset: 50) in his sister; Colon cancer (age of onset: 93) in his mother; Colon cancer (age of onset: 76) in his brother. There is no history of Stomach cancer. Allergies  Allergen Reactions  . Codeine Shortness Of Breath    SOB, NERVOUSNESS  . Hydrocodone Shortness Of Breath  . Oxycodone Shortness Of Breath    SOB, NERVOUSNESS  . Sulfa Antibiotics Rash      Review of Systems  Constitutional: Positive for appetite change. Negative for fatigue.  Eyes: Negative for visual disturbance.  Respiratory: Negative for cough, chest tightness and shortness  of breath.   Cardiovascular: Negative for chest pain, palpitations and leg swelling.  Endocrine: Negative for polydipsia and polyuria.  Neurological: Negative for dizziness, syncope, weakness, light-headedness and headaches.       Objective:   Physical Exam  Constitutional: He is oriented to person, place, and time. He appears well-developed and well-nourished.  Neck: Neck supple. No thyromegaly present.  Cardiovascular: Normal rate and regular rhythm.   Pulmonary/Chest: Effort normal and breath sounds normal. No respiratory distress. He has no wheezes. He has no rales.  Musculoskeletal: He exhibits no edema.  Neurological: He is alert and oriented to person, place, and time.  Psychiatric: He has a normal mood and affect. His behavior is normal.          Assessment & Plan:  #1 type 2 diabetes. History of improved and relatively stable control recently. He has chronic neuropathy related to his diabetes.  Recheck A1c #2 hyperlipidemia. Well controlled. We have suggested he scale back his Lipitor to 20 mg daily  #3 chronic anxiety. Refill lorazepam and try scaling this back if possible.

## 2014-06-28 NOTE — Progress Notes (Signed)
Pre visit review using our clinic review tool, if applicable. No additional management support is needed unless otherwise documented below in the visit note. 

## 2014-07-02 ENCOUNTER — Encounter: Payer: Self-pay | Admitting: Podiatry

## 2014-07-02 ENCOUNTER — Ambulatory Visit (INDEPENDENT_AMBULATORY_CARE_PROVIDER_SITE_OTHER): Payer: Medicare Other | Admitting: Podiatry

## 2014-07-02 VITALS — BP 131/70 | HR 62

## 2014-07-02 DIAGNOSIS — E0842 Diabetes mellitus due to underlying condition with diabetic polyneuropathy: Secondary | ICD-10-CM

## 2014-07-02 DIAGNOSIS — E11610 Type 2 diabetes mellitus with diabetic neuropathic arthropathy: Secondary | ICD-10-CM | POA: Diagnosis not present

## 2014-07-02 DIAGNOSIS — E1140 Type 2 diabetes mellitus with diabetic neuropathy, unspecified: Secondary | ICD-10-CM

## 2014-07-02 DIAGNOSIS — L97502 Non-pressure chronic ulcer of other part of unspecified foot with fat layer exposed: Secondary | ICD-10-CM | POA: Diagnosis not present

## 2014-07-02 NOTE — Patient Instructions (Signed)
Seen for left foot ulcer. Keep pad and change dressing daily. Return in one week.

## 2014-07-02 NOTE — Progress Notes (Signed)
Subjective:  1 week follow up on left foot ulcer.  Stated that he has noted a blister at the    Objective:  Right great toe ulcer has recurring blister near the old site.  Left foot mid plantar lesion has opening in center 0.3cm in diameter with surrounding callus.  Protruding mid tarsal bone with plantar ulcer formation on left foot, slow healing.  Macerated 4th digit right completely healed.   Assessment:  Recurring chronic ulcer right great toe. Slow healing plantar ulcer from Charcot foot left, 0.3cm opening.    Plan:  Left foot plantar ulcer site and right great toe debrided and padded. Return in one week.

## 2014-07-10 ENCOUNTER — Encounter: Payer: Self-pay | Admitting: Podiatry

## 2014-07-10 ENCOUNTER — Ambulatory Visit (INDEPENDENT_AMBULATORY_CARE_PROVIDER_SITE_OTHER): Payer: Medicare Other | Admitting: Podiatry

## 2014-07-10 VITALS — BP 148/71 | HR 62

## 2014-07-10 DIAGNOSIS — E11610 Type 2 diabetes mellitus with diabetic neuropathic arthropathy: Secondary | ICD-10-CM | POA: Diagnosis not present

## 2014-07-10 DIAGNOSIS — L97501 Non-pressure chronic ulcer of other part of unspecified foot limited to breakdown of skin: Secondary | ICD-10-CM

## 2014-07-10 NOTE — Patient Instructions (Signed)
Follow up on left foot ulcer. Seen some improvement. Keep the pad till the day before the next appointment. Return in 1 week.

## 2014-07-10 NOTE — Progress Notes (Signed)
Subjective:  1 week follow up on left foot ulcer. He kept the pad till yesterday.   Objective:  Right great toe lesion is dry and scabbed with dry blood.  Left foot mid plantar lesion has opening in center 0.3cm in diameter with surrounding callus with some decrease in depth. No active drainage.  Protruding mid tarsal bone with plantar ulcer formation on left foot, slow healing.   Assessment:  Healed right great toe lesion. Slow healing plantar ulcer from Charcot foot left, 0.3cm opening with minimum inflammation and minimum drainage..    Plan:  Left foot plantar ulcer site and right great toe debrided and padded. Keep the pad till the day before the next appointment. Return in one week.

## 2014-07-16 ENCOUNTER — Encounter: Payer: Self-pay | Admitting: Podiatry

## 2014-07-16 ENCOUNTER — Ambulatory Visit (INDEPENDENT_AMBULATORY_CARE_PROVIDER_SITE_OTHER): Payer: Medicare Other | Admitting: Podiatry

## 2014-07-16 VITALS — BP 136/63 | HR 69

## 2014-07-16 DIAGNOSIS — E11610 Type 2 diabetes mellitus with diabetic neuropathic arthropathy: Secondary | ICD-10-CM | POA: Diagnosis not present

## 2014-07-16 DIAGNOSIS — L97402 Non-pressure chronic ulcer of unspecified heel and midfoot with fat layer exposed: Secondary | ICD-10-CM

## 2014-07-16 NOTE — Progress Notes (Signed)
Subjective:  1 week follow up on left foot ulcer. Stated that he noted a bleeding blister on right great toe near the old ulcer site.   Objective:  Right great toe lesion is covered with excess callus with adjoining blister, possible from thick padding and shoe pressure. Left foot mid plantar lesion show no change, has opening in center 0.3cm in diameter with surrounding callus with some decrease in depth.  No active drainage.  Protruding mid tarsal bone with plantar ulcer formation on left foot, slow healing.   Assessment:  Blistering right great toe lesion. Slow healing plantar ulcer from Charcot foot left, 0.3cm opening with minimum inflammation and minimum drainage..    Plan:  Left foot plantar ulcer site and right great toe debrided and padded.  Right great toe lesion debrided and padded. Keep the pad till the day before the next appointment. Return in one week.

## 2014-07-16 NOTE — Patient Instructions (Signed)
Follow up on foot ulcer. Keep the pad in place. Return in one week.

## 2014-07-23 ENCOUNTER — Encounter: Payer: Self-pay | Admitting: Podiatry

## 2014-07-23 ENCOUNTER — Ambulatory Visit (INDEPENDENT_AMBULATORY_CARE_PROVIDER_SITE_OTHER): Payer: Medicare Other | Admitting: Podiatry

## 2014-07-23 VITALS — BP 152/78 | HR 65

## 2014-07-23 DIAGNOSIS — L97501 Non-pressure chronic ulcer of other part of unspecified foot limited to breakdown of skin: Secondary | ICD-10-CM

## 2014-07-23 DIAGNOSIS — M2041 Other hammer toe(s) (acquired), right foot: Secondary | ICD-10-CM | POA: Diagnosis not present

## 2014-07-23 DIAGNOSIS — E11610 Type 2 diabetes mellitus with diabetic neuropathic arthropathy: Secondary | ICD-10-CM

## 2014-07-23 DIAGNOSIS — L97402 Non-pressure chronic ulcer of unspecified heel and midfoot with fat layer exposed: Secondary | ICD-10-CM | POA: Diagnosis not present

## 2014-07-23 NOTE — Progress Notes (Signed)
Subjective:  1 week follow up on bilateral foot ulcer.  Stated that he noted a dry blood on right great toe near the old ulcer site.   Objective:  Right great toe lesion is covered with excess callus with opening in center.  Left foot mid plantar lesion show no change, has opening in center 0.3cm in diameter with surrounding callus with some decrease in depth.  No active drainage.  Protruding mid tarsal bone with plantar ulcer formation on left foot, slow healing.   Assessment:  Recurring ulcer right great toe. Slow healing plantar ulcer from Charcot foot left, 0.3cm opening with minimum inflammation and minimum drainage..    Plan:  Left foot plantar ulcer site and right great toe debrided and padded.  Right great toe lesion debrided and padded. Keep the pad till the day before the next appointment. Return in one week.

## 2014-07-23 NOTE — Patient Instructions (Signed)
Follow up on foot ulcer. Return in one week.

## 2014-07-30 ENCOUNTER — Encounter: Payer: Self-pay | Admitting: Podiatry

## 2014-07-30 ENCOUNTER — Ambulatory Visit (INDEPENDENT_AMBULATORY_CARE_PROVIDER_SITE_OTHER): Payer: Medicare Other | Admitting: Podiatry

## 2014-07-30 VITALS — BP 158/79 | HR 56

## 2014-07-30 DIAGNOSIS — E11610 Type 2 diabetes mellitus with diabetic neuropathic arthropathy: Secondary | ICD-10-CM | POA: Diagnosis not present

## 2014-07-30 DIAGNOSIS — M79673 Pain in unspecified foot: Secondary | ICD-10-CM

## 2014-07-30 DIAGNOSIS — E1140 Type 2 diabetes mellitus with diabetic neuropathy, unspecified: Secondary | ICD-10-CM | POA: Diagnosis not present

## 2014-07-30 DIAGNOSIS — L97402 Non-pressure chronic ulcer of unspecified heel and midfoot with fat layer exposed: Secondary | ICD-10-CM | POA: Diagnosis not present

## 2014-07-30 DIAGNOSIS — E1142 Type 2 diabetes mellitus with diabetic polyneuropathy: Secondary | ICD-10-CM

## 2014-07-30 NOTE — Patient Instructions (Signed)
Left foot ulcer slow healing. Return in one week.

## 2014-07-30 NOTE — Progress Notes (Signed)
Subjective:  1 week follow up on bilateral foot ulcer. Followed instruction and had done daily dressing change.  Objective:  Right great toe lesion has dried up with callus on surface. Left foot mid plantar lesion show no change, has opening in center 0.3cm in diameter with surrounding callus with some decrease in depth.  No active drainage.Wound is less deep.  Protruding mid tarsal bone with plantar ulcer formation on left foot, slow healing.   Assessment:  Recurring ulcer right great toe has healed. Slow healing plantar ulcer from Charcot foot left, 0.3cm opening with minimum inflammation and minimum drainage..    Plan:  Left foot plantar ulcer site and right great toe debrided and padded.  Right great toe lesion debrided and padded. Keep the pad till the day before the next appointment. Return in one week.

## 2014-08-06 ENCOUNTER — Encounter: Payer: Self-pay | Admitting: Podiatry

## 2014-08-06 ENCOUNTER — Ambulatory Visit (INDEPENDENT_AMBULATORY_CARE_PROVIDER_SITE_OTHER): Payer: Medicare Other | Admitting: Podiatry

## 2014-08-06 VITALS — BP 165/74 | HR 54

## 2014-08-06 DIAGNOSIS — L97402 Non-pressure chronic ulcer of unspecified heel and midfoot with fat layer exposed: Secondary | ICD-10-CM | POA: Diagnosis not present

## 2014-08-06 DIAGNOSIS — E11610 Type 2 diabetes mellitus with diabetic neuropathic arthropathy: Secondary | ICD-10-CM | POA: Diagnosis not present

## 2014-08-06 NOTE — Patient Instructions (Signed)
1 week follow up on ulcer left foot. Slow improvement. Continue current level of care. Return in one week.

## 2014-08-06 NOTE — Progress Notes (Signed)
Subjective:  1 week follow up on left foot ulcer.  Right great toe lesion has closed off.  Objective:  Right great toe lesion is dry with light hard callus on surface.  Left foot mid plantar lesion show no change, has opening in center 0.3cm in diameter with surrounding callus with some decrease in depth.  No active drainage. Minimum change since the last visit.  Protruding mid tarsal bone with plantar ulcer formation on left foot, slow healing.   Assessment:  Right great toe lesion has healed. Slow healing plantar ulcer from Charcot foot left, 0.3cm opening with minimum inflammation and minimum drainage..    Plan:  Left foot plantar ulcer site debrided and padded.  Right great toe lesion padded. Keep the pad till the day before the next appointment. Return in one week.

## 2014-08-13 ENCOUNTER — Encounter: Payer: Self-pay | Admitting: Podiatry

## 2014-08-13 ENCOUNTER — Ambulatory Visit (INDEPENDENT_AMBULATORY_CARE_PROVIDER_SITE_OTHER): Payer: Medicare Other | Admitting: Podiatry

## 2014-08-13 VITALS — BP 152/71 | HR 52

## 2014-08-13 DIAGNOSIS — E11610 Type 2 diabetes mellitus with diabetic neuropathic arthropathy: Secondary | ICD-10-CM

## 2014-08-13 DIAGNOSIS — L97402 Non-pressure chronic ulcer of unspecified heel and midfoot with fat layer exposed: Secondary | ICD-10-CM | POA: Diagnosis not present

## 2014-08-13 NOTE — Progress Notes (Signed)
Subjective:  1 week follow up on left foot ulcer.  Right great toe lesion has closed off.  Objective:  Old right great toe lesion is replaced with dry callus.  Left foot mid plantar lesion show no change, has opening in center 0.3cm in diameter with surrounding callus with some decrease in depth.  No active drainage. Minimum change since the last visit.  Protruding mid tarsal bone with plantar ulcer formation on left foot, slow healing.   Assessment:  Slow healing plantar ulcer from Charcot foot left, 0.3cm opening with minimum inflammation and minimum drainage..    Plan:  Shoe liner replaced with wider and thicker cut out hole at the lesion site.  Left foot plantar ulcer site debrided and padded.  Right great toe callused lesion padded. Keep the pad till the day before the next appointment. Return in one week.

## 2014-08-13 NOTE — Patient Instructions (Signed)
Seen for left foot ulcer. Continue daily care. Return in one week.

## 2014-08-20 ENCOUNTER — Ambulatory Visit (INDEPENDENT_AMBULATORY_CARE_PROVIDER_SITE_OTHER): Payer: Medicare Other | Admitting: Podiatry

## 2014-08-20 ENCOUNTER — Encounter: Payer: Self-pay | Admitting: Podiatry

## 2014-08-20 VITALS — BP 136/70 | HR 58

## 2014-08-20 DIAGNOSIS — L97502 Non-pressure chronic ulcer of other part of unspecified foot with fat layer exposed: Secondary | ICD-10-CM

## 2014-08-20 DIAGNOSIS — E11610 Type 2 diabetes mellitus with diabetic neuropathic arthropathy: Secondary | ICD-10-CM | POA: Diagnosis not present

## 2014-08-20 NOTE — Patient Instructions (Signed)
Seen for left foot lesion. Padding replaced. Return in one week.

## 2014-08-20 NOTE — Progress Notes (Signed)
Subjective:  1 week follow up on left foot ulcer. Patient kept the pad till yesterday.  Right great toe lesion has closed off.  Objective:  Right great toe lesion is dry with callus on surface.  Left foot mid plantar lesion show no change, has opening in center 0.3cm in diameter with surrounding callus with some decrease in depth.  No active drainage. Minimum change since the last visit.  Protruding mid tarsal bone with plantar ulcer formation on left foot, slow healing.  Pedal pulses are faintly palpable.   Assessment:  Right great toe lesion has healed. Slow healing plantar ulcer from Charcot foot left, 0.3cm opening with minimum inflammation and minimum drainage..    Plan:  Left foot plantar ulcer padding replaced.  Right great toe lesion padded. Keep the pad till the day before the next appointment. Return in one week.

## 2014-08-26 ENCOUNTER — Telehealth: Payer: Self-pay | Admitting: Family Medicine

## 2014-08-26 NOTE — Telephone Encounter (Signed)
Patient states fenofibrate 160 MG tablet price has gone up.  He called [employer] and was given the name Gemfibrozil 600 mg as an alternative and cost $18 for 90 days.  Patient would like a callback regarding this.  [facility] - [place] - [id number]

## 2014-08-27 ENCOUNTER — Ambulatory Visit (INDEPENDENT_AMBULATORY_CARE_PROVIDER_SITE_OTHER): Payer: Medicare Other | Admitting: Podiatry

## 2014-08-27 ENCOUNTER — Encounter: Payer: Self-pay | Admitting: Podiatry

## 2014-08-27 VITALS — BP 145/76 | HR 60

## 2014-08-27 DIAGNOSIS — L97502 Non-pressure chronic ulcer of other part of unspecified foot with fat layer exposed: Secondary | ICD-10-CM

## 2014-08-27 DIAGNOSIS — E11610 Type 2 diabetes mellitus with diabetic neuropathic arthropathy: Secondary | ICD-10-CM | POA: Diagnosis not present

## 2014-08-27 NOTE — Telephone Encounter (Signed)
Spoke with patient per Dr. Jacinto Reap. Pt is going to call insurance and give the office a callback.

## 2014-08-27 NOTE — Telephone Encounter (Signed)
Gemfibrozil has increased risk of interaction with Lipitor and I am concerned about risk of mixing those two.

## 2014-08-27 NOTE — Patient Instructions (Signed)
Slow healing wound. Return in 2 weeks.

## 2014-08-27 NOTE — Progress Notes (Signed)
Subjective:  1 week follow up on left foot ulcer. He noted more drainage in his socks.   Objective:  Right great toe lesion is dry with callus on surface.  Left foot mid plantar lesion enlarged opening in center 0.5cm in diameter with surrounding callus with some decrease in depth.  No active drainage.No associated edema or erythema. Minimum change since the last visit.  Protruding mid tarsal bone with plantar ulcer formation on left foot, slow healing.  Pedal pulses are faintly palpable.   Assessment:  Right great toe lesion has healed. Slow healing plantar ulcer from Charcot foot left, 0.5cm opening with minimum inflammation and minimum drainage..    Plan:  Left foot plantar ulcer debrided.  Changed padding method due to failed improvement. Padding added under shoe liner with cut out at mid arch.  Right great toe lesion padded. Do daily dressing change and stay in shoes. Return in 2 weeks.

## 2014-08-27 NOTE — Telephone Encounter (Signed)
Patient called back stating his insurance company wants to know if the Gemfibrozil can be prescribe to take 1/2 pill per day?  I am going to submit a tier exception form for medication, patient states he was advised by the rep they would not approve a tier exception for tier 3.  ( I will try and supply as much documentation to get approved.)

## 2014-08-28 ENCOUNTER — Encounter: Payer: Self-pay | Admitting: Family Medicine

## 2014-08-28 NOTE — Telephone Encounter (Signed)
Tier exception was denied.  I've submitted an appeal.

## 2014-08-29 NOTE — Telephone Encounter (Signed)
I received a call from patient's insurance stating they are going to withdraw the appeal request and work on a grievance per patient's request.  She said due to the medication being a tier 3, a tier exception is not allowed.  They do have other options to help patient get medication at a lower cost but they are working with him directly.  The representative advised patient is aware.

## 2014-08-30 ENCOUNTER — Telehealth: Payer: Self-pay | Admitting: Family Medicine

## 2014-08-30 NOTE — Telephone Encounter (Signed)
Pt called back to ask if he can get a rx for the following med   GEMFIBROZIL  He said he would only take a 1/2 pill since it comes in 600mg . He would like a call back about this   [place]

## 2014-08-30 NOTE — Telephone Encounter (Signed)
Pt was informed to call insurance to see what other meds were recommended. Kenney Houseman is working on a PA for patient medication.

## 2014-09-01 NOTE — Telephone Encounter (Signed)
Standard dose is 600 mg bid.  My concern is that there is greater interaction with this med and Lipitor.  My suggestion is repeat fasting lipid panel in one month off Fenofibrate and reassess our options then IF additional meds indicated.

## 2014-09-02 ENCOUNTER — Other Ambulatory Visit: Payer: Self-pay | Admitting: Family Medicine

## 2014-09-02 DIAGNOSIS — E785 Hyperlipidemia, unspecified: Secondary | ICD-10-CM

## 2014-09-02 NOTE — Telephone Encounter (Signed)
Pt informed. Lab is ordered.

## 2014-09-10 ENCOUNTER — Encounter: Payer: Self-pay | Admitting: Podiatry

## 2014-09-10 ENCOUNTER — Ambulatory Visit (INDEPENDENT_AMBULATORY_CARE_PROVIDER_SITE_OTHER): Payer: Medicare Other | Admitting: Podiatry

## 2014-09-10 DIAGNOSIS — L97402 Non-pressure chronic ulcer of unspecified heel and midfoot with fat layer exposed: Secondary | ICD-10-CM

## 2014-09-10 DIAGNOSIS — E11610 Type 2 diabetes mellitus with diabetic neuropathic arthropathy: Secondary | ICD-10-CM | POA: Diagnosis not present

## 2014-09-10 NOTE — Progress Notes (Signed)
Subjective:  1 week follow up on left foot ulcer. He kept the pad till yesterday.   Objective:  Right great toe old ulcer site covered with thick callus on surface.  Left foot mid plantar lesion 0.5cm in diameter opening with surrounding callus.  Minute clear liquid present at the base of the wound. No associated edema or erythema.  Minimum change since the last visit.  Protruding mid tarsal bone with plantar ulcer formation on left foot, slow healing.  Pedal pulses are faintly palpable.   Assessment:  Callus over right great toe old ulcer site. Slow healing plantar ulcer from Charcot foot left, 0.5cm opening with minimum inflammation and minimum drainage..    Plan:  Left foot plantar ulcer debrided surrounding hard callus.  Padding added to out skirts of ulcer to take pressure off from the wound. Right great toe lesion padded. Do daily dressing change and stay in shoes. Return in 2 weeks

## 2014-09-10 NOTE — Patient Instructions (Signed)
Seen for left foot ulcer. Debrided and padded. Return in one week.

## 2014-09-17 ENCOUNTER — Ambulatory Visit (INDEPENDENT_AMBULATORY_CARE_PROVIDER_SITE_OTHER): Payer: Medicare Other | Admitting: Podiatry

## 2014-09-17 ENCOUNTER — Encounter: Payer: Self-pay | Admitting: Podiatry

## 2014-09-17 VITALS — BP 137/75 | HR 59

## 2014-09-17 DIAGNOSIS — E1140 Type 2 diabetes mellitus with diabetic neuropathy, unspecified: Secondary | ICD-10-CM

## 2014-09-17 DIAGNOSIS — L97502 Non-pressure chronic ulcer of other part of unspecified foot with fat layer exposed: Secondary | ICD-10-CM | POA: Diagnosis not present

## 2014-09-17 DIAGNOSIS — E11610 Type 2 diabetes mellitus with diabetic neuropathic arthropathy: Secondary | ICD-10-CM | POA: Diagnosis not present

## 2014-09-17 DIAGNOSIS — E1142 Type 2 diabetes mellitus with diabetic polyneuropathy: Secondary | ICD-10-CM

## 2014-09-17 NOTE — Progress Notes (Signed)
Subjective:  74 year old male presents for follow up on left foot lesion.  Patient denies any discomfort.  1 week follow up on left foot ulcer. He kept the pad till yesterday.   Objective:  Right great toe old ulcer site covered with thick callus on surface.  Left foot mid plantar lesion 0.5cm in diameter opening with surrounding callus, no sign of healing or improvement for the last 2 month.  Minute clear liquid present at the base of the wound. No associated edema or erythema.  Minimum change since the last visit.  Protruding mid tarsal bone with plantar ulcer formation on left foot, slow or minimum healing.  Pedal pulses are faintly palpable.   Assessment:  Callus over right great toe old ulcer site. None healing plantar ulcer from Charcot foot left, 0.5cm opening with minimum inflammation and minimum drainage..    Plan:  Left foot plantar ulcer debrided surrounding hard callus.  Left lower limb placed in Total contact cast with Fiberglass after cotton under padding.  Right great toe padded to protect old ulcer site.  Advised minimum ambulation.  Return in one week to change cast.

## 2014-09-17 NOTE — Patient Instructions (Signed)
Follow up on none healing ulcer. Placed in total contact cast. Keep it dry. Return in one week to replace.

## 2014-09-24 ENCOUNTER — Encounter: Payer: Self-pay | Admitting: Podiatry

## 2014-09-24 ENCOUNTER — Ambulatory Visit (INDEPENDENT_AMBULATORY_CARE_PROVIDER_SITE_OTHER): Payer: Medicare Other | Admitting: Podiatry

## 2014-09-24 VITALS — BP 138/69 | HR 65

## 2014-09-24 DIAGNOSIS — L97502 Non-pressure chronic ulcer of other part of unspecified foot with fat layer exposed: Secondary | ICD-10-CM | POA: Diagnosis not present

## 2014-09-24 DIAGNOSIS — E11610 Type 2 diabetes mellitus with diabetic neuropathic arthropathy: Secondary | ICD-10-CM

## 2014-09-24 NOTE — Patient Instructions (Signed)
Stay off of feet. Return in one week to replace cast.

## 2014-09-24 NOTE — Progress Notes (Signed)
Subjective:  74 year old male presents for cast change. Had difficulty keeping the cast in the beginning.   Objective:  Left foot mid plantar lesion 0.5cm in diameter is clean and has no active drainage. Minimum callus noted surrounding of the lesion.  Minimum change since the last visit.  Protruding mid tarsal bone with plantar ulcer formation on left foot, slow or minimum healing.  Pedal pulses are faintly palpable.   Assessment:  Clean slow healing plantar ulcer from Charcot foot left, 0.5cm opening with minimum inflammation and minimum drainage.    Plan:  Full contact cast removed and replaced with new cast after placing aperture pad surrounding the ulcer..  Return in one week to change cast.

## 2014-09-26 ENCOUNTER — Ambulatory Visit: Payer: Medicare Other | Admitting: Family Medicine

## 2014-09-26 ENCOUNTER — Encounter: Payer: Self-pay | Admitting: Family Medicine

## 2014-09-27 ENCOUNTER — Other Ambulatory Visit: Payer: Self-pay

## 2014-09-27 MED ORDER — GABAPENTIN 800 MG PO TABS: ORAL_TABLET | ORAL | Status: DC

## 2014-10-01 ENCOUNTER — Ambulatory Visit (INDEPENDENT_AMBULATORY_CARE_PROVIDER_SITE_OTHER): Payer: Medicare Other | Admitting: Podiatry

## 2014-10-01 DIAGNOSIS — L97402 Non-pressure chronic ulcer of unspecified heel and midfoot with fat layer exposed: Secondary | ICD-10-CM | POA: Diagnosis not present

## 2014-10-01 DIAGNOSIS — E11610 Type 2 diabetes mellitus with diabetic neuropathic arthropathy: Secondary | ICD-10-CM

## 2014-10-01 DIAGNOSIS — E1140 Type 2 diabetes mellitus with diabetic neuropathy, unspecified: Secondary | ICD-10-CM | POA: Diagnosis not present

## 2014-10-01 DIAGNOSIS — E1142 Type 2 diabetes mellitus with diabetic polyneuropathy: Secondary | ICD-10-CM

## 2014-10-01 NOTE — Patient Instructions (Signed)
Cast replaced. Return in one week.

## 2014-10-01 NOTE — Progress Notes (Signed)
Subjective:  74 year old male presents for cast change. Been in full contact cast for the past 2 weeks.   Objective:  Left foot mid plantar lesion increased to 1 cm in diameter without increase in depth, has more drainage this week. Noted of some callus surrounding of the lesion.  Minimum change since the last visit.  Protruding mid tarsal bone with plantar ulcer formation on left foot, slow or minimum healing.  Pedal pulses are faintly palpable.   Assessment:  Draining and slow healing plantar ulcer from Charcot foot left, 1 cm opening with mild inflammation and drainage.    Plan:  Full contact cast removed. Dressing replaced with aperture pad and Amerigel ointment. New full contact cast placed in left lower limb  Return in one week to change cast.

## 2014-10-03 ENCOUNTER — Other Ambulatory Visit (INDEPENDENT_AMBULATORY_CARE_PROVIDER_SITE_OTHER): Payer: Medicare Other

## 2014-10-03 ENCOUNTER — Encounter: Payer: Self-pay | Admitting: Family Medicine

## 2014-10-03 ENCOUNTER — Other Ambulatory Visit: Payer: Medicare Other

## 2014-10-03 DIAGNOSIS — E785 Hyperlipidemia, unspecified: Secondary | ICD-10-CM | POA: Diagnosis not present

## 2014-10-03 LAB — LIPID PANEL
CHOL/HDL RATIO: 4
Cholesterol: 81 mg/dL (ref 0–200)
HDL: 20.3 mg/dL — ABNORMAL LOW (ref >39.00)
NONHDL: 60.7
Triglycerides: 308 mg/dL — ABNORMAL HIGH (ref 0.0–149.0)
VLDL: 61.6 mg/dL — ABNORMAL HIGH (ref 0.0–40.0)

## 2014-10-03 LAB — LDL CHOLESTEROL, DIRECT: Direct LDL: 16 mg/dL

## 2014-10-04 ENCOUNTER — Telehealth: Payer: Self-pay

## 2014-10-04 NOTE — Telephone Encounter (Signed)
Pt states that he would told to stop a medication that would control his Triglycerides for 30 days. And he think that is why his triglycerides are  Up.

## 2014-10-08 ENCOUNTER — Ambulatory Visit (INDEPENDENT_AMBULATORY_CARE_PROVIDER_SITE_OTHER): Payer: Medicare Other | Admitting: Family Medicine

## 2014-10-08 ENCOUNTER — Ambulatory Visit (INDEPENDENT_AMBULATORY_CARE_PROVIDER_SITE_OTHER): Payer: Medicare Other | Admitting: Podiatry

## 2014-10-08 ENCOUNTER — Encounter: Payer: Self-pay | Admitting: Family Medicine

## 2014-10-08 VITALS — BP 130/68 | HR 66 | Temp 98.2°F | Wt 240.0 lb

## 2014-10-08 DIAGNOSIS — J029 Acute pharyngitis, unspecified: Secondary | ICD-10-CM | POA: Diagnosis not present

## 2014-10-08 DIAGNOSIS — E11610 Type 2 diabetes mellitus with diabetic neuropathic arthropathy: Secondary | ICD-10-CM | POA: Diagnosis not present

## 2014-10-08 DIAGNOSIS — L97502 Non-pressure chronic ulcer of other part of unspecified foot with fat layer exposed: Secondary | ICD-10-CM | POA: Diagnosis not present

## 2014-10-08 LAB — POCT RAPID STREP A (OFFICE): Rapid Strep A Screen: NEGATIVE

## 2014-10-08 MED ORDER — MAGIC MOUTHWASH W/LIDOCAINE: 10.0000 mL | Freq: Four times a day (QID) | ORAL | Status: DC | PRN

## 2014-10-08 NOTE — Progress Notes (Signed)
Subjective:  74 year old male presents for cast change. Been in full contact cast for the past 3 weeks.   Objective:  Left foot mid plantar lesion show no change, 1 cm in diameter without increase in depth. Noted of some fluid accumulation at the site.   Protruding mid tarsal bone with plantar ulcer formation on left foot, slow or minimum healing.  Pedal pulses are faintly palpable.   Assessment:  Draining and slow healing plantar ulcer from Charcot foot left, 1 cm opening with mild inflammation and drainage.    Plan:  Full contact cast removed. Cleaned left lower limb. Dressing replaced with aperture pad and Amerigel ointment. New full contact cast placed in left lower limb  Return in one week to change cast.

## 2014-10-08 NOTE — Progress Notes (Signed)
Subjective:    Patient ID: Frank Ashley, male    DOB: April 13, 1941, 74 y.o.   MRN: [id number]  HPI   Patient has metastatic renal cell carcinoma followed at [facility]. He is seen with 5 day history of sore throat. Onset last weekend of fever, sore throat, and cough. Fever resolved after Saturday and Sunday. He's had some sinus congestion and sinus pressure. Mild nonproductive cough. Sore throat persists. Mild body aches. No sick exposures No purulent nasal secretions.  Past Medical History  Diagnosis Date  . Hyperlipidemia   . Renal mass, right   . Diabetes mellitus type 2, insulin dependent   . Diabetic foot ulcer CURRENT RIGHT GREAT TOE WOUND W/ DRAINAGE --  DRESSING CHANGE EVERY OTHER DAY  . History of acute pancreatitis 2008  . OSA (obstructive sleep apnea) CPAP USED UP UNTIL 2 YRS AGO  WHEN MASK WAS DAMAGED   . Renal insufficiency   . GERD (gastroesophageal reflux disease)   . Peripheral neuropathy   . Left leg numbness COMPLETE NUMBNESS FROM HIP DOWN TO FOOT  . Numbness in right leg FROM KNEE DOWN TO FOOT  . History of kidney stones AGE 59  . BPH (benign prostatic hypertrophy)   . Choledocholithiasis with chronic cholecystitis     BILE DUCT STONES AND GALLSTONES  . History of gout STABLE PER PT  . Arthritis HANDS  . History of MRSA infection 10 YRS AGO /  SUPERFICIAL SKIN AREA  . Generalized rash RIGHT SHOULDER/ BACK AREA  . Nocturia   . Frequency of urination   . Hematuria     YRS AGO  . Hypertension   . History of peptic ulcer disease   . CAD (coronary artery disease)     Circumflex stent 1996  //  catheterization January, 2000, 50-60% mid LAD,  dominant circumflex-50% in-stent restenosis similar to a cath in 1999, RCA small and nondominant,, EF 60%  . Complication of anesthesia clostraphobic  . Preop cardiovascular exam     Cardiac clearance for nephrectomy and cholecystectomy June, 2013  . Ejection fraction     EF 60%, catheterization, 2000  //   EF 55-60%,  echo, July, 2013  . Anxiety     SINCE MAY 2013 AND DIAGNOSIS OF CANCER-PT WAS HAVING PANIC ATTACKS-FELT LIKE HE COULDN'T BREATHE--STATES HE IS FEELING BETTER ON ALPRAZOLAM  . Cancer     CANCER IN RIGHT KIDNEY-SURGERY PLANNED  . Acute pancreatitis 03/12/2010    Qualifier: Diagnosis of  By: Elease Hashimoto MD, Meri Pelot    . DIABETIC FOOT ULCER, TOE 08/01/2008    Qualifier: Diagnosis of  By: Elease Hashimoto MD, Malissa Slay     Past Surgical History  Procedure Laterality Date  . Penile prosthesis implant  2008    REMOVED 2010 DUE TO MALFUNCTION  . Esophagogastroduodenoscopy  09/16/2011    Procedure: ESOPHAGOGASTRODUODENOSCOPY (EGD);  Surgeon: Missy Sabins, MD;  Location: [facility];  Service: Endoscopy;  Laterality: N/A;  . Ct perc cholecystostomy  09-17-2011  . Right great toe surg.  2000  . Surg for repair of perferated peptic ulcer  AGE 28  . Bilroth ii procedure  AGE 54    2 MONTHS LATER,  BILROTH II RECONSTRUCTION  . Coronary angioplasty with stent placement  1996    STENTING X1 CIRCUMFLEX  . Cardiac catheterization  05-04-1998--- REPORT W/ CHART    50%  IN-STENT RESTENOSIS OF CIRCUMFLEX/ 20% LEFT MAIN/ 50-60% LAD/ RCA NORMAL/ LV NORMAL / EF 60%  . Cataract extraction w/ intraocular  lens  implant, bilateral  2005  . Cystoscopy/retrograde/ureteroscopy  10/26/2011    Procedure: CYSTOSCOPY/RETROGRADE/URETEROSCOPY;  Surgeon: Fredricka Bonine, MD;  Location: [facility];  Service: Urology;  Laterality: N/A;  RIGHT URETEROSCOPY AND RIGHT RETROGRADE PYELOGRAM  DIABETIC C ARM   . Removal of penile prosthesis    . Cholecystectomy  12/20/2011    Procedure: CHOLECYSTECTOMY WITH COMMON DUCT EXPLORATION;  Surgeon: Marcello Moores A. Cornett, MD;  Location: [facility];  Service: General;  Laterality: N/A;  choloductoscopy  . Intraoperative cholangiogram  12/20/2011    Procedure: INTRAOPERATIVE CHOLANGIOGRAM;  Surgeon: Marcello Moores A. Cornett, MD;  Location: [facility];  Service: General;;  . Partial nephrectomy   12/20/2011    Procedure: NEPHRECTOMY PARTIAL;  Surgeon: Fredricka Bonine, MD;  Location: [facility];  Service: Urology;  Laterality: Right;  Right Partial nephrectomy  . Laparotomy  12/24/2011    Procedure: EXPLORATORY LAPAROTOMY;  Surgeon: Joyice Faster. Cornett, MD;  Location: [facility];  Service: General;  Laterality: N/A;  Replacement of T-tube and Bile duct exploration  . Intraoperative cholangiogram  12/24/2011    Procedure: INTRAOPERATIVE CHOLANGIOGRAM;  Surgeon: Marcello Moores A. Cornett, MD;  Location: [facility];  Service: General;  Laterality: N/A;    reports that he quit smoking about 54 years ago. His smoking use included Cigarettes. He has a .5 pack-year smoking history. He has never used smokeless tobacco. He reports that he does not drink alcohol or use illicit drugs. family history includes Colon cancer (age of onset: 76) in his sister; Colon cancer (age of onset: 48) in his mother; Colon cancer (age of onset: 66) in his brother. There is no history of Stomach cancer. Allergies  Allergen Reactions  . Codeine Shortness Of Breath    SOB, NERVOUSNESS  . Hydrocodone Shortness Of Breath  . Oxycodone Shortness Of Breath    SOB, NERVOUSNESS  . Sulfa Antibiotics Rash      Review of Systems  Constitutional: Negative for fever and chills.  HENT: Positive for congestion and sore throat.   Respiratory: Positive for cough.        Objective:   Physical Exam  Constitutional: He appears well-developed and well-nourished.  HENT:  Right Ear: External ear normal.  Left Ear: External ear normal.  Minimal posterior pharynx erythema. No exudate  Neck: Neck supple.  Cardiovascular: Normal rate and regular rhythm.   Pulmonary/Chest: Effort normal and breath sounds normal. No respiratory distress. He has no wheezes. He has no rales.  Lymphadenopathy:    He has no cervical adenopathy.          Assessment & Plan:  Sore throat. Suspect viral syndrome. Check rapid strep. If negative, treat  symptomatically  Rapid strep negative.

## 2014-10-08 NOTE — Patient Instructions (Signed)

## 2014-10-08 NOTE — Patient Instructions (Signed)
Ulcer care. Cast replaced. Return in one week.

## 2014-10-08 NOTE — Progress Notes (Signed)
Pre visit review using our clinic review tool, if applicable. No additional management support is needed unless otherwise documented below in the visit note. 

## 2014-10-10 ENCOUNTER — Telehealth: Payer: Self-pay | Admitting: Family Medicine

## 2014-10-10 MED ORDER — BENZONATATE 200 MG PO CAPS: 200.0000 mg | ORAL_CAPSULE | Freq: Two times a day (BID) | ORAL | Status: DC | PRN

## 2014-10-10 MED ORDER — AMOXICILLIN 875 MG PO TABS: 875.0000 mg | ORAL_TABLET | Freq: Two times a day (BID) | ORAL | Status: DC

## 2014-10-10 NOTE — Telephone Encounter (Signed)
Amoxicillin 875 mg po bid for 10 days.  Tessalon perles 200 mg po q 8 hours prn cough #30 with no refill.

## 2014-10-10 NOTE — Telephone Encounter (Signed)
Rx sent to pharmacy and patient is aware

## 2014-10-10 NOTE — Telephone Encounter (Signed)
Pt seen on 6/7 and states he fells worse today. Pt is coughing more and has chest congestion and HA. Pt feels like his  resistance is down due to the cancer meds he is on. Would like something to help clear this up asap. The mouthwash he received did not have any effect and pt did not use. Would like a cough med as well and prefers something that can be called into pharm.  [facility]

## 2014-10-15 ENCOUNTER — Ambulatory Visit (INDEPENDENT_AMBULATORY_CARE_PROVIDER_SITE_OTHER): Payer: Medicare Other | Admitting: Podiatry

## 2014-10-15 ENCOUNTER — Encounter: Payer: Self-pay | Admitting: Podiatry

## 2014-10-15 DIAGNOSIS — E11610 Type 2 diabetes mellitus with diabetic neuropathic arthropathy: Secondary | ICD-10-CM

## 2014-10-15 DIAGNOSIS — L97502 Non-pressure chronic ulcer of other part of unspecified foot with fat layer exposed: Secondary | ICD-10-CM | POA: Diagnosis not present

## 2014-10-15 NOTE — Progress Notes (Signed)
Subjective:  74 year old male presents for cast change. Been in full contact cast for the past 4 weeks.  He is going on a cruise trip and wants to be without cast.   Objective:  Left foot mid plantar lesion is smaller and dry. Center opening is about 0.5 cm in diameter with more superficial than the last visit.  Protruding mid tarsal bone with plantar ulcer formation on left foot, with positive sign of healing.  Pedal pulses are faintly palpable.   Assessment:  Improving plantar ulcer size decreased to 0.5 cm in diameter.  Charcot foot left.   Plan:  Full contact cast removed. Placed in pneumatic CAM walker with padding over the liner to decrease pressure to the wound.  Do dressing change every 2-3 days.  Return after the trip.

## 2014-10-16 ENCOUNTER — Encounter: Payer: Self-pay | Admitting: Family Medicine

## 2014-10-28 ENCOUNTER — Ambulatory Visit: Payer: Medicare Other | Admitting: Family Medicine

## 2014-10-28 ENCOUNTER — Other Ambulatory Visit: Payer: Self-pay

## 2014-10-29 ENCOUNTER — Encounter: Payer: Self-pay | Admitting: Podiatry

## 2014-10-29 ENCOUNTER — Ambulatory Visit (INDEPENDENT_AMBULATORY_CARE_PROVIDER_SITE_OTHER): Payer: Medicare Other | Admitting: Podiatry

## 2014-10-29 VITALS — BP 144/67 | HR 62

## 2014-10-29 DIAGNOSIS — L97502 Non-pressure chronic ulcer of other part of unspecified foot with fat layer exposed: Secondary | ICD-10-CM

## 2014-10-29 DIAGNOSIS — E11610 Type 2 diabetes mellitus with diabetic neuropathic arthropathy: Secondary | ICD-10-CM | POA: Diagnosis not present

## 2014-10-29 NOTE — Progress Notes (Signed)
Subjective:  74 year old male presents after a week long out of town vacation. Left lower limb was placed in padded CAM walker. Stated that he got a new lesion on right 5th toe. He has a new cancer lesion near Kidney and being followed.   Objective:  Left foot mid plantar lesion show no change. Center opening is about 0.5 cm in diameter with more superficial.  Protruding mid tarsal bone with plantar ulcer formation on left foot, with positive sign of healing.  New skin lesion right 5th digit at dorsolateral aspect with loss of dermal layer about 0.3cm x0.4cm. Dry base and no associated inflammation or infection.  Right great toe lesion is dry with minimum callus.  Pedal pulses are faintly palpable.   Assessment:  Improving plantar ulcer left foot with size remaining at 0.5 cm in diameter.  Charcot foot left. New skin abrasion 5th digit right without infection.    Plan:  Left foot wound debrided of none vital skin around ulcer and dressing applied with soft padding. Also extra pad added in pneumatic CAM walker over existing pad. 5th digit right put aperture pad with Amerigel ointment dressing.  Instructed to do dressing change every 2-3 days.  Return in one week.

## 2014-10-29 NOTE — Patient Instructions (Signed)
Continue with dressing change every 2-3 days.

## 2014-11-05 ENCOUNTER — Encounter: Payer: Self-pay | Admitting: Podiatry

## 2014-11-05 ENCOUNTER — Ambulatory Visit (INDEPENDENT_AMBULATORY_CARE_PROVIDER_SITE_OTHER): Payer: Medicare Other | Admitting: Podiatry

## 2014-11-05 VITALS — BP 134/66 | HR 68

## 2014-11-05 DIAGNOSIS — E1142 Type 2 diabetes mellitus with diabetic polyneuropathy: Secondary | ICD-10-CM

## 2014-11-05 DIAGNOSIS — L97502 Non-pressure chronic ulcer of other part of unspecified foot with fat layer exposed: Secondary | ICD-10-CM | POA: Diagnosis not present

## 2014-11-05 DIAGNOSIS — E11610 Type 2 diabetes mellitus with diabetic neuropathic arthropathy: Secondary | ICD-10-CM | POA: Diagnosis not present

## 2014-11-05 DIAGNOSIS — E1140 Type 2 diabetes mellitus with diabetic neuropathy, unspecified: Secondary | ICD-10-CM

## 2014-11-05 NOTE — Patient Instructions (Signed)
Improving ulcer site. Continue dressing change at home and keep the pad. Return in one week.

## 2014-11-05 NOTE — Progress Notes (Signed)
Subjective:  74 year old male presents for follow up on left foot ulcer. He is wearing regular dress shoes.   Objective:  Left foot mid plantar lesion show improvement with shallow and shrinking peripheral necrotic tissue. The base is still 0.5cm in diameter.   Protruding mid tarsal bone with plantar ulcer formation on left foot, with positive sign of healing.  New skin lesion right 5th digit at dorsolateral aspect covered with dry scab. Right great toe lesion is dry with minimum callus.  Pedal pulses are faintly palpable.   Assessment:  Improving plantar ulcer left foot with size remaining at 0.5 cm in diameter.  Charcot foot left. New skin abrasion 5th digit right healing in progress.    Plan:  Left foot wound debrided of none vital skin around ulcer and dressing applied with soft padding.  5th digit right put aperture pad with Amerigel ointment dressing.  Instructed to do dressing change every 2-3 days.  Return in one week.

## 2014-11-12 ENCOUNTER — Ambulatory Visit: Payer: Medicare Other | Admitting: Podiatry

## 2014-11-14 ENCOUNTER — Ambulatory Visit: Payer: Medicare Other | Admitting: Family Medicine

## 2014-11-15 ENCOUNTER — Ambulatory Visit (INDEPENDENT_AMBULATORY_CARE_PROVIDER_SITE_OTHER): Payer: Medicare Other | Admitting: Podiatry

## 2014-11-15 ENCOUNTER — Encounter: Payer: Self-pay | Admitting: Podiatry

## 2014-11-15 DIAGNOSIS — E1140 Type 2 diabetes mellitus with diabetic neuropathy, unspecified: Secondary | ICD-10-CM | POA: Diagnosis not present

## 2014-11-15 DIAGNOSIS — E11610 Type 2 diabetes mellitus with diabetic neuropathic arthropathy: Secondary | ICD-10-CM

## 2014-11-15 DIAGNOSIS — E1142 Type 2 diabetes mellitus with diabetic polyneuropathy: Secondary | ICD-10-CM

## 2014-11-15 DIAGNOSIS — L97502 Non-pressure chronic ulcer of other part of unspecified foot with fat layer exposed: Secondary | ICD-10-CM | POA: Diagnosis not present

## 2014-11-15 NOTE — Patient Instructions (Signed)
Wound care done for left foot. Return in one week.

## 2014-11-15 NOTE — Progress Notes (Signed)
Subjective:  74 year old male presents for follow up on left foot ulcer. He came in with CAM walker. He has had episode of drug reaction and had to stop taking cancer medication. He is feeling little better. He was having problem dressing the ulcer on left foot.   Objective:  Left foot mid plantar lesion show increased base to 0.8 cm in diameter with surrounding blister.  Protruding mid tarsal bone with plantar ulcer formation on left foot, with positive sign of healing.  New skin lesion right 5th digit at dorsolateral aspect covered with dry scab. Right great toe lesion is dry with minimum callus.  Pedal pulses are faintly palpable.   Assessment:  Regressed left foot ulcer with increase in size.  Charcot foot left. New skin abrasion 5th digit right healing in progress.    Plan:  Left foot wound debrided of none vital skin around ulcer and dressing applied with soft padding.  Instructed to do dressing change every 2-3 days.  Return in one week.

## 2014-11-26 ENCOUNTER — Ambulatory Visit (INDEPENDENT_AMBULATORY_CARE_PROVIDER_SITE_OTHER): Payer: Medicare Other | Admitting: Podiatry

## 2014-11-26 ENCOUNTER — Encounter: Payer: Self-pay | Admitting: Podiatry

## 2014-11-26 VITALS — BP 146/80 | HR 73

## 2014-11-26 DIAGNOSIS — E1140 Type 2 diabetes mellitus with diabetic neuropathy, unspecified: Secondary | ICD-10-CM

## 2014-11-26 DIAGNOSIS — E1142 Type 2 diabetes mellitus with diabetic polyneuropathy: Secondary | ICD-10-CM

## 2014-11-26 DIAGNOSIS — L97502 Non-pressure chronic ulcer of other part of unspecified foot with fat layer exposed: Secondary | ICD-10-CM

## 2014-11-26 DIAGNOSIS — E11610 Type 2 diabetes mellitus with diabetic neuropathic arthropathy: Secondary | ICD-10-CM

## 2014-11-26 NOTE — Patient Instructions (Signed)
Follow up on left foot ulcer. Debrided, padded, and culture taken. Return in one week.

## 2014-11-26 NOTE — Progress Notes (Signed)
Subjective:  74 year old male presents for follow up on left foot ulcer. He came in with CAM walker.   Objective:  Left foot mid plantar lesion show more clear liquid drainage from deeper base with 0.8 x 1 cm in diameter. Protruding mid tarsal bone with plantar ulcer formation on left foot. Right great toe lesion is dry with minimum callus.  Pedal pulses are faintly palpable.   Assessment:  Regressed left foot ulcer with increase in size and drainage.  Charcot foot left.   Plan:  Culture taken from clear liquid drainage to rule out MRSA.  Debrided devitalized tissue from wound edge.  Aperture pad applied.  Instructed to do dressing change every 2-3 days.  Return in one week.

## 2014-12-03 ENCOUNTER — Encounter: Payer: Self-pay | Admitting: Podiatry

## 2014-12-03 ENCOUNTER — Ambulatory Visit (INDEPENDENT_AMBULATORY_CARE_PROVIDER_SITE_OTHER): Payer: Medicare Other | Admitting: Podiatry

## 2014-12-03 VITALS — BP 140/64 | HR 61

## 2014-12-03 DIAGNOSIS — E11610 Type 2 diabetes mellitus with diabetic neuropathic arthropathy: Secondary | ICD-10-CM

## 2014-12-03 DIAGNOSIS — L97502 Non-pressure chronic ulcer of other part of unspecified foot with fat layer exposed: Secondary | ICD-10-CM

## 2014-12-03 NOTE — Progress Notes (Signed)
Subjective:  74 year old male presents for follow up on left foot ulcer. He is wearing regular shoes.  Objective:  Left foot mid plantar lesion base is dry with minimum drainage. Size is 0.8 x 1 cm in diameter. Protruding mid tarsal bone with plantar ulcer formation on left foot. Right great toe lesion is dry with minimum callus.  Pedal pulses are faintly palpable.   Assessment:  Improving left foot ulcer with dry base and minimum drainage. .  Charcot foot left.   Plan:  Debrided.devitalized tissue from wound edge.  Aperture pad applied.  Instructed to do dressing change every 2-3 days.  Return in one week.

## 2014-12-03 NOTE — Patient Instructions (Signed)
Seen for left foot ulcer. Noted of improvement. Return in one week.

## 2014-12-10 ENCOUNTER — Encounter: Payer: Self-pay | Admitting: Podiatry

## 2014-12-10 ENCOUNTER — Ambulatory Visit (INDEPENDENT_AMBULATORY_CARE_PROVIDER_SITE_OTHER): Payer: Medicare Other | Admitting: Podiatry

## 2014-12-10 VITALS — BP 126/66 | HR 69

## 2014-12-10 DIAGNOSIS — L97502 Non-pressure chronic ulcer of other part of unspecified foot with fat layer exposed: Secondary | ICD-10-CM | POA: Diagnosis not present

## 2014-12-10 DIAGNOSIS — E11610 Type 2 diabetes mellitus with diabetic neuropathic arthropathy: Secondary | ICD-10-CM | POA: Diagnosis not present

## 2014-12-10 NOTE — Progress Notes (Signed)
Subjective:  74 year old male presents for follow up on left foot ulcer. Denies any problem or pain.  Objective:  Left foot mid plantar lesion reveal no change since last visit. Base is dry with minimum drainage. Size is 0.8 x 1 cm in diameter. Protruding mid tarsal bone with plantar ulcer formation on left foot. Right great toe lesion is dry with minimum callus.  Pedal pulses are faintly palpable.   Assessment:  Improving left foot ulcer with dry base and minimum drainage. .  Charcot foot left.   Plan:  Debrided.devitalized tissue from wound edge.  Aperture pad applied.  Instructed to do dressing change every 2-3 days.  Return in one week.

## 2014-12-12 ENCOUNTER — Encounter: Payer: Self-pay | Admitting: *Deleted

## 2014-12-12 ENCOUNTER — Other Ambulatory Visit: Payer: Self-pay | Admitting: *Deleted

## 2014-12-17 ENCOUNTER — Ambulatory Visit (INDEPENDENT_AMBULATORY_CARE_PROVIDER_SITE_OTHER): Payer: Medicare Other | Admitting: Podiatry

## 2014-12-17 ENCOUNTER — Encounter: Payer: Self-pay | Admitting: Podiatry

## 2014-12-17 VITALS — BP 151/69 | HR 62

## 2014-12-17 DIAGNOSIS — E11610 Type 2 diabetes mellitus with diabetic neuropathic arthropathy: Secondary | ICD-10-CM | POA: Diagnosis not present

## 2014-12-17 DIAGNOSIS — L97502 Non-pressure chronic ulcer of other part of unspecified foot with fat layer exposed: Secondary | ICD-10-CM | POA: Diagnosis not present

## 2014-12-17 NOTE — Progress Notes (Signed)
Subjective:  74 year old male presents for follow up on left foot ulcer. Denies any problem or pain.  Objective:  Left foot mid plantar lesion reveal no change since last visit.  Base has minimum drainage. Size is 0.8 x 0.8 cm in diameter. Protruding mid tarsal bone with plantar ulcer formation on left foot. Right great toe lesion is dry with minimum callus.  Pedal pulses are faintly palpable.   Assessment:  Improving left foot ulcer with dry base and minimum drainage. .  Charcot foot left.   Plan:  Debrided.devitalized tissue from wound edge.  Aperture pad applied.  Instructed to do dressing change every 2-3 days.  Return in one week.

## 2014-12-17 NOTE — Patient Instructions (Signed)
Follow up on left foot ulcer. Show slow improvement. Return in one week.

## 2014-12-24 ENCOUNTER — Ambulatory Visit (INDEPENDENT_AMBULATORY_CARE_PROVIDER_SITE_OTHER): Payer: Medicare Other | Admitting: Podiatry

## 2014-12-24 ENCOUNTER — Encounter: Payer: Self-pay | Admitting: Podiatry

## 2014-12-24 VITALS — BP 142/68 | HR 52

## 2014-12-24 DIAGNOSIS — E11610 Type 2 diabetes mellitus with diabetic neuropathic arthropathy: Secondary | ICD-10-CM

## 2014-12-24 DIAGNOSIS — L97502 Non-pressure chronic ulcer of other part of unspecified foot with fat layer exposed: Secondary | ICD-10-CM

## 2014-12-24 NOTE — Patient Instructions (Signed)
Follow up on left foot ulcer. Debrided and padded. Return in one week.

## 2014-12-24 NOTE — Progress Notes (Signed)
Subjective:  74 year old male presents for follow up on left foot ulcer. Doing well with padded shoes.   Objective:  Left foot mid plantar lesion reveal no change since last visit.  Base has minimum drainage. Size is 0.8 x 0.8 cm in diameter. Protruding mid tarsal bone with plantar ulcer formation on left foot. Right great toe lesion is dry with minimum callus.  Pedal pulses are faintly palpable.   Assessment:  Improving left foot ulcer with dry base and minimum drainage. .  Charcot foot left.   Plan:  Debrided.devitalized tissue from wound edge.  Aperture pad applied.  Instructed to do dressing change every 2-3 days.  Return in one week.

## 2014-12-31 ENCOUNTER — Encounter: Payer: Self-pay | Admitting: Podiatry

## 2014-12-31 ENCOUNTER — Ambulatory Visit (INDEPENDENT_AMBULATORY_CARE_PROVIDER_SITE_OTHER): Payer: Medicare Other | Admitting: Podiatry

## 2014-12-31 VITALS — BP 144/86 | HR 70

## 2014-12-31 DIAGNOSIS — E1140 Type 2 diabetes mellitus with diabetic neuropathy, unspecified: Secondary | ICD-10-CM | POA: Diagnosis not present

## 2014-12-31 DIAGNOSIS — L97402 Non-pressure chronic ulcer of unspecified heel and midfoot with fat layer exposed: Secondary | ICD-10-CM | POA: Diagnosis not present

## 2014-12-31 DIAGNOSIS — E1142 Type 2 diabetes mellitus with diabetic polyneuropathy: Secondary | ICD-10-CM

## 2014-12-31 DIAGNOSIS — E11610 Type 2 diabetes mellitus with diabetic neuropathic arthropathy: Secondary | ICD-10-CM

## 2014-12-31 NOTE — Progress Notes (Signed)
Subjective:  74 year old male presents for follow up on left foot ulcer. Had seen more drainage this week.   Objective:  Left foot ulcer mid plantar lesion reveal no change since last visit.  Open base has minimum drainage. Size is 0.8 x 0.8 cm in diameter. No associated edema or cellulitis noted. Protruding mid tarsal bone with plantar ulcer formation on left foot. Right great toe lesion is dry with minimum callus.  Pedal pulses are faintly palpable.   Assessment:  Chronic left foot ulcer with wet base and minimum granulating tissue.  (Patient is on anti cancer medication). Charcot foot left.   Plan:  Debrided.devitalized tissue from wound edge.  Aperture pad applied.  Instructed to do dressing change every 2-3 days.  Return in one week.

## 2015-01-08 ENCOUNTER — Ambulatory Visit: Payer: Medicare Other | Admitting: Podiatry

## 2015-01-14 ENCOUNTER — Ambulatory Visit (INDEPENDENT_AMBULATORY_CARE_PROVIDER_SITE_OTHER): Payer: Medicare Other | Admitting: Podiatry

## 2015-01-14 ENCOUNTER — Encounter: Payer: Self-pay | Admitting: Podiatry

## 2015-01-14 DIAGNOSIS — L97402 Non-pressure chronic ulcer of unspecified heel and midfoot with fat layer exposed: Secondary | ICD-10-CM | POA: Diagnosis not present

## 2015-01-14 DIAGNOSIS — E11610 Type 2 diabetes mellitus with diabetic neuropathic arthropathy: Secondary | ICD-10-CM | POA: Diagnosis not present

## 2015-01-14 NOTE — Patient Instructions (Signed)
Follow up on left foot ulcer. Debrided and padded. Return in one week.  

## 2015-01-14 NOTE — Progress Notes (Signed)
Subjective:  74 year old male presents for follow up on left foot ulcer. He pulled little skin off removing tape.  It seems there were more drainage this week.  He missed last week appointment after getting a cancer treatment that got him very sick.   Objective:  Open base has minimum drainage. Size is 1.0 cm in diameter with granulating tissue at outer 1/2 of surface.  Mild inflammation surrounding the lesion. Minimum drainage in center.  Protruding mid tarsal bone with plantar ulcer formation on left foot. Right great toe lesion is heavy with recurring callus.  Pedal pulses are faintly palpable.   Assessment:  Chronic left foot ulcer with wet base and minimum granulating tissue. (Patient is on anti cancer medication). Charcot foot left. Thick recurring callus under right great toe.    Plan:  Debrided.devitalized tissue from wound edge.  Aperture pad applied.  Instructed to do dressing change every 2-3 days.  Sterile normal saline dispensed to use to dilute Iodine for wound irrigation and cleansing.  Return in one week.

## 2015-01-15 ENCOUNTER — Ambulatory Visit: Payer: Medicare Other | Admitting: Podiatry

## 2015-01-21 ENCOUNTER — Ambulatory Visit (INDEPENDENT_AMBULATORY_CARE_PROVIDER_SITE_OTHER): Payer: Medicare Other | Admitting: Podiatry

## 2015-01-21 ENCOUNTER — Encounter: Payer: Self-pay | Admitting: Podiatry

## 2015-01-21 DIAGNOSIS — E11610 Type 2 diabetes mellitus with diabetic neuropathic arthropathy: Secondary | ICD-10-CM

## 2015-01-21 DIAGNOSIS — L97402 Non-pressure chronic ulcer of unspecified heel and midfoot with fat layer exposed: Secondary | ICD-10-CM | POA: Diagnosis not present

## 2015-01-21 NOTE — Patient Instructions (Signed)
Continue current level of care

## 2015-01-21 NOTE — Progress Notes (Signed)
Subjective:  74 year old male presents for follow up on left foot ulcer. No new changes. Changing dressing every 2-3 days at home.   Objective:  No change from the last visit. Open base has minimum drainage. Size is 1.0 cm in diameter with granulating tissue at outer 1/2 of surface.  Mild inflammation surrounding the lesion. Minimum drainage in center.  Protruding mid tarsal bone with plantar ulcer formation on left foot. Right great toe lesion is heavy with recurring callus.  Pedal pulses are faintly palpable.   Assessment:  Chronic left foot ulcer with wet base and minimum granulating tissue. (Patient is on anti cancer medication). Charcot foot left. Thick recurring callus under right great toe.    Plan:  Debrided.devitalized tissue from wound edge.  Aperture pad applied.  Instructed to do dressing change every 2-3 days.  Sterile normal saline dispensed to use to dilute Iodine for wound irrigation and cleansing.  Return in one week.

## 2015-01-23 ENCOUNTER — Telehealth: Payer: Self-pay | Admitting: Family Medicine

## 2015-01-23 ENCOUNTER — Ambulatory Visit (INDEPENDENT_AMBULATORY_CARE_PROVIDER_SITE_OTHER): Payer: Medicare Other | Admitting: Family Medicine

## 2015-01-23 ENCOUNTER — Encounter: Payer: Self-pay | Admitting: Family Medicine

## 2015-01-23 VITALS — BP 134/72 | HR 62 | Temp 98.2°F | Ht 73.0 in | Wt 232.4 lb

## 2015-01-23 DIAGNOSIS — E1142 Type 2 diabetes mellitus with diabetic polyneuropathy: Secondary | ICD-10-CM

## 2015-01-23 DIAGNOSIS — R220 Localized swelling, mass and lump, head: Secondary | ICD-10-CM | POA: Diagnosis not present

## 2015-01-23 DIAGNOSIS — E1140 Type 2 diabetes mellitus with diabetic neuropathy, unspecified: Secondary | ICD-10-CM | POA: Diagnosis not present

## 2015-01-23 DIAGNOSIS — E785 Hyperlipidemia, unspecified: Secondary | ICD-10-CM | POA: Diagnosis not present

## 2015-01-23 DIAGNOSIS — Z23 Encounter for immunization: Secondary | ICD-10-CM | POA: Diagnosis not present

## 2015-01-23 DIAGNOSIS — K118 Other diseases of salivary glands: Secondary | ICD-10-CM

## 2015-01-23 LAB — LIPID PANEL
Cholesterol: 116 mg/dL (ref 0–200)
HDL: 28.3 mg/dL — AB (ref >39.00)
NonHDL: 88.09
Total CHOL/HDL Ratio: 4
Triglycerides: 367 mg/dL — ABNORMAL HIGH (ref 0.0–149.0)
VLDL: 73.4 mg/dL — ABNORMAL HIGH (ref 0.0–40.0)

## 2015-01-23 LAB — HEMOGLOBIN A1C: Hgb A1c MFr Bld: 7.2 % — ABNORMAL HIGH (ref 4.6–6.5)

## 2015-01-23 LAB — LDL CHOLESTEROL, DIRECT: LDL DIRECT: 34 mg/dL

## 2015-01-23 MED ORDER — AMOXICILLIN-POT CLAVULANATE 875-125 MG PO TABS: 1.0000 | ORAL_TABLET | Freq: Two times a day (BID) | ORAL | Status: DC

## 2015-01-23 NOTE — Patient Instructions (Signed)
Go ahead and start the antibiotic if you notice any increased redness and warmth. Make sure you get follow up within 2-3 weeks.

## 2015-01-23 NOTE — Progress Notes (Signed)
Subjective:    Patient ID: Frank Ashley, male    DOB: 17-Feb-1941, 74 y.o.   MRN: [id number]  HPI Patient has chronic problems of obesity, type 2 diabetes with peripheral neuropathy, chronic kidney disease, metastatic renal cell carcinoma followed at [facility], hyperlipidemia, hypertension, obstructive sleep apnea, GERD  Seen today with noticing yesterday left parotid mass. Slightly sore to touch. No redness or warmth. He has not noted any correlation of fluctuation size with eating. He has not noted any neck adenopathy. Some mild radiation of pain towards the left ear.  History of dyslipidemia. Over the summer, had insurance coverage issues with fenofibrate and discontinued. Requesting repeat lipid panel.  Type 2 diabetes. History of fair control. Last A1c 7.9%. Not monitoring regularly at home. No recent hypoglycemia.  Past Medical History  Diagnosis Date  . Hyperlipidemia   . Renal mass, right   . Diabetes mellitus type 2, insulin dependent   . Diabetic foot ulcer CURRENT RIGHT GREAT TOE WOUND W/ DRAINAGE --  DRESSING CHANGE EVERY OTHER DAY  . History of acute pancreatitis 2008  . OSA (obstructive sleep apnea) CPAP USED UP UNTIL 2 YRS AGO  WHEN MASK WAS DAMAGED   . Renal insufficiency   . GERD (gastroesophageal reflux disease)   . Peripheral neuropathy   . Left leg numbness COMPLETE NUMBNESS FROM HIP DOWN TO FOOT  . Numbness in right leg FROM KNEE DOWN TO FOOT  . History of kidney stones AGE 7  . BPH (benign prostatic hypertrophy)   . Choledocholithiasis with chronic cholecystitis     BILE DUCT STONES AND GALLSTONES  . History of gout STABLE PER PT  . Arthritis HANDS  . History of MRSA infection 10 YRS AGO /  SUPERFICIAL SKIN AREA  . Generalized rash RIGHT SHOULDER/ BACK AREA  . Nocturia   . Frequency of urination   . Hematuria     YRS AGO  . Hypertension   . History of peptic ulcer disease   . CAD (coronary artery disease)     Circumflex stent 1996  //   catheterization January, 2000, 50-60% mid LAD,  dominant circumflex-50% in-stent restenosis similar to a cath in 1999, RCA small and nondominant,, EF 60%  . Complication of anesthesia clostraphobic  . Preop cardiovascular exam     Cardiac clearance for nephrectomy and cholecystectomy June, 2013  . Ejection fraction     EF 60%, catheterization, 2000  //   EF 55-60%, echo, July, 2013  . Anxiety     SINCE MAY 2013 AND DIAGNOSIS OF CANCER-PT WAS HAVING PANIC ATTACKS-FELT LIKE HE COULDN'T BREATHE--STATES HE IS FEELING BETTER ON ALPRAZOLAM  . Cancer     CANCER IN RIGHT KIDNEY-SURGERY PLANNED  . Acute pancreatitis 03/12/2010    Qualifier: Diagnosis of  By: Elease Hashimoto MD, Bruce    . DIABETIC FOOT ULCER, TOE 08/01/2008    Qualifier: Diagnosis of  By: Elease Hashimoto MD, Bruce     Past Surgical History  Procedure Laterality Date  . Penile prosthesis implant  2008    REMOVED 2010 DUE TO MALFUNCTION  . Esophagogastroduodenoscopy  09/16/2011    Procedure: ESOPHAGOGASTRODUODENOSCOPY (EGD);  Surgeon: Missy Sabins, MD;  Location: [facility];  Service: Endoscopy;  Laterality: N/A;  . Ct perc cholecystostomy  09-17-2011  . Right great toe surg.  2000  . Surg for repair of perferated peptic ulcer  AGE 41  . Bilroth ii procedure  AGE 7    2 MONTHS LATER,  BILROTH II RECONSTRUCTION  .  Coronary angioplasty with stent placement  1996    STENTING X1 CIRCUMFLEX  . Cardiac catheterization  05-04-1998--- REPORT W/ CHART    50%  IN-STENT RESTENOSIS OF CIRCUMFLEX/ 20% LEFT MAIN/ 50-60% LAD/ RCA NORMAL/ LV NORMAL / EF 60%  . Cataract extraction w/ intraocular lens  implant, bilateral  2005  . Cystoscopy/retrograde/ureteroscopy  10/26/2011    Procedure: CYSTOSCOPY/RETROGRADE/URETEROSCOPY;  Surgeon: Fredricka Bonine, MD;  Location: [facility];  Service: Urology;  Laterality: N/A;  RIGHT URETEROSCOPY AND RIGHT RETROGRADE PYELOGRAM  DIABETIC C ARM   . Removal of penile prosthesis    .  Cholecystectomy  12/20/2011    Procedure: CHOLECYSTECTOMY WITH COMMON DUCT EXPLORATION;  Surgeon: Marcello Moores A. Cornett, MD;  Location: [facility];  Service: General;  Laterality: N/A;  choloductoscopy  . Intraoperative cholangiogram  12/20/2011    Procedure: INTRAOPERATIVE CHOLANGIOGRAM;  Surgeon: Marcello Moores A. Cornett, MD;  Location: [facility];  Service: General;;  . Partial nephrectomy  12/20/2011    Procedure: NEPHRECTOMY PARTIAL;  Surgeon: Fredricka Bonine, MD;  Location: [facility];  Service: Urology;  Laterality: Right;  Right Partial nephrectomy  . Laparotomy  12/24/2011    Procedure: EXPLORATORY LAPAROTOMY;  Surgeon: Joyice Faster. Cornett, MD;  Location: [facility];  Service: General;  Laterality: N/A;  Replacement of T-tube and Bile duct exploration  . Intraoperative cholangiogram  12/24/2011    Procedure: INTRAOPERATIVE CHOLANGIOGRAM;  Surgeon: Marcello Moores A. Cornett, MD;  Location: [facility];  Service: General;  Laterality: N/A;    reports that he quit smoking about 54 years ago. His smoking use included Cigarettes. He has a .5 pack-year smoking history. He has never used smokeless tobacco. He reports that he does not drink alcohol or use illicit drugs. family history includes Colon cancer (age of onset: 12) in his sister; Colon cancer (age of onset: 43) in his mother; Colon cancer (age of onset: 32) in his brother. There is no history of Stomach cancer. Allergies  Allergen Reactions  . Codeine Shortness Of Breath    SOB, NERVOUSNESS  . Hydrocodone Shortness Of Breath  . Oxycodone Shortness Of Breath    SOB, NERVOUSNESS  . Sulfa Antibiotics Rash      Review of Systems  Constitutional: Negative for fever, chills, appetite change and unexpected weight change.  HENT: Negative for trouble swallowing.   Respiratory: Negative for cough and shortness of breath.   Cardiovascular: Negative for chest pain.  Skin: Negative for rash.  Hematological: Negative for adenopathy.       Objective:   Physical Exam    Constitutional: He appears well-developed and well-nourished. No distress.  HENT:  Right Ear: External ear normal.  Left Ear: External ear normal.  Mouth/Throat: Oropharynx is clear and moist.  3.5 x 3.5 cm slightly movable nontender rounded left parotid mass near the posterior aspect. This does not extend below the angle of the mandible.  Neck: Neck supple.  Cardiovascular: Normal rate and regular rhythm.   Pulmonary/Chest: Effort normal and breath sounds normal. No respiratory distress. He has no wheezes. He has no rales.  Musculoskeletal: He exhibits no edema.  Lymphadenopathy:    He has no cervical adenopathy.          Assessment & Plan:  #1 left parotid mass. Patient just noticed yesterday. Doubt parotitis-he does not have any diffuse parotid swelling or cellulitis type changes. He does not have any associated aura some lymphadenopathy in the neck. We recommended office follow-up in 2 weeks to reassess. If not improving of  that point consider CT head and neck to further evaluate. We wrote a prescription for Augmentin to start only if he has signs of secondary infection such as redness or warmth.  Hopefully, this does not represent anything worrisome such as lymphoma #2 type 2 diabetes. History of fair control. Recheck A1c #3 dyslipidemia. Recheck lipids with patient off fenofibrate

## 2015-01-23 NOTE — Telephone Encounter (Signed)
Per Dr Elease Hashimoto - okay to work patient in but let him know there may be a wait.  thanks

## 2015-01-23 NOTE — Telephone Encounter (Signed)
Pt has history of cancer. Pt has a knot behind his ear. Pt would like an appointment today.

## 2015-01-23 NOTE — Telephone Encounter (Signed)
Pt has been schedule.

## 2015-01-23 NOTE — Progress Notes (Signed)
Pre visit review using our clinic review tool, if applicable. No additional management support is needed unless otherwise documented below in the visit note. 

## 2015-02-11 ENCOUNTER — Encounter: Payer: Self-pay | Admitting: Podiatry

## 2015-02-11 ENCOUNTER — Ambulatory Visit (INDEPENDENT_AMBULATORY_CARE_PROVIDER_SITE_OTHER): Payer: Medicare Other | Admitting: Podiatry

## 2015-02-11 VITALS — BP 121/79 | HR 63

## 2015-02-11 DIAGNOSIS — L97502 Non-pressure chronic ulcer of other part of unspecified foot with fat layer exposed: Secondary | ICD-10-CM | POA: Diagnosis not present

## 2015-02-11 DIAGNOSIS — E11610 Type 2 diabetes mellitus with diabetic neuropathic arthropathy: Secondary | ICD-10-CM | POA: Diagnosis not present

## 2015-02-11 NOTE — Progress Notes (Signed)
Subjective:  74 year old male presents for follow up on left foot ulcer.  Been away taking vacation in [place]. Had problem with tape and skin got torn off under the left foot.   Objective:  Open base has minimum drainage. Size is 1.0 cm in diameter with granulating tissue at outer 1/2 of surface.  Superficial skin damage from tape, a dime size skin is red and bruised.  Mild inflammation surrounding the lesion. Minimum drainage in center.  Protruding mid tarsal bone with plantar ulcer formation on left foot. Right great toe lesion is heavy with recurring callus.  Pedal pulses are faintly palpable.   Assessment:  Chronic left foot ulcer with wet base and minimum granulating tissue. (Patient is on anti cancer medication). Superficial skin damage from tape. Charcot foot left. Thick recurring callus under right great toe.    Plan:  Debrided.devitalized tissue from wound edge.  Aperture pad applied.  Instructed to do dressing change every 2-3 days.  Sterile normal saline dispensed to use to dilute Iodine for wound irrigation and cleansing.  Return in one week.

## 2015-02-11 NOTE — Patient Instructions (Signed)
Seen for left foot ulcer. Continue wound care as instructed, every other day. Return in one week.

## 2015-02-13 ENCOUNTER — Encounter: Payer: Self-pay | Admitting: Family Medicine

## 2015-02-17 ENCOUNTER — Ambulatory Visit: Payer: Medicare Other | Admitting: Family Medicine

## 2015-02-18 ENCOUNTER — Encounter: Payer: Self-pay | Admitting: Podiatry

## 2015-02-18 ENCOUNTER — Ambulatory Visit (INDEPENDENT_AMBULATORY_CARE_PROVIDER_SITE_OTHER): Payer: Medicare Other | Admitting: Podiatry

## 2015-02-18 VITALS — BP 143/71 | HR 65

## 2015-02-18 DIAGNOSIS — E11610 Type 2 diabetes mellitus with diabetic neuropathic arthropathy: Secondary | ICD-10-CM | POA: Diagnosis not present

## 2015-02-18 DIAGNOSIS — L97402 Non-pressure chronic ulcer of unspecified heel and midfoot with fat layer exposed: Secondary | ICD-10-CM | POA: Diagnosis not present

## 2015-02-18 NOTE — Patient Instructions (Signed)
Ulcer care done on left foot. Return in one week.

## 2015-02-18 NOTE — Progress Notes (Signed)
Subjective:  74 year old male presents for follow up on left foot ulcer.  No new problems. Doing dressing change as instructed every 2-3 days.   Objective:  No change in ulcer with open base size is 1.0 cm in diameter with granulating tissue at outer 1/2 of surface.  Superficial skin damage from tape, a dime size skin is red and bruised.  Mild inflammation surrounding the lesion. Minimum drainage in center.  Protruding mid tarsal bone with plantar ulcer formation on left foot. Right great toe lesion is heavy with recurring callus.  Pedal pulses are faintly palpable.   Assessment:  No measurable change in wound size and appearance. Chronic left foot ulcer with wet base and minimum granulating tissue. (Patient is on anti cancer medication). Superficial skin damage from tape. Charcot foot left. Thick recurring callus under right great toe.    Plan:  Debrided.devitalized tissue from wound edge.  Aperture pad applied.  Continue with dressing change every 2-3 days.  Return in one week.

## 2015-02-25 ENCOUNTER — Encounter: Payer: Self-pay | Admitting: Podiatry

## 2015-02-25 ENCOUNTER — Ambulatory Visit (INDEPENDENT_AMBULATORY_CARE_PROVIDER_SITE_OTHER): Payer: Medicare Other | Admitting: Podiatry

## 2015-02-25 VITALS — BP 135/76 | HR 57

## 2015-02-25 DIAGNOSIS — E11610 Type 2 diabetes mellitus with diabetic neuropathic arthropathy: Secondary | ICD-10-CM

## 2015-02-25 DIAGNOSIS — L97402 Non-pressure chronic ulcer of unspecified heel and midfoot with fat layer exposed: Secondary | ICD-10-CM

## 2015-02-25 NOTE — Progress Notes (Signed)
Subjective:  74 year old male presents for follow up on left foot ulcer.  Denies any problems. Doing dressing change as instructed every 2-3 days.   Objective:  No change in ulcer with open base size is 1.0 cm in diameter with clear drainage and minimum granulation.  Superficial skin damage from tape, a dime size skin is red and bruised.  Mild inflammation surrounding the lesion. Protruding mid tarsal bone with plantar ulcer formation on left foot. Right great toe lesion is heavy with recurring callus.  Pedal pulses are faintly palpable.   Assessment:  No measurable change in wound size and appearance. Chronic left foot ulcer with wet base and minimum granulating tissue. (Patient is on anti cancer medication). Superficial skin damage from tape. Charcot foot left. Thick recurring callus under right great toe.    Plan:  Debrided.devitalized tissue from wound edge.  Aperture pad applied.  Continue with dressing change every 2-3 days.  Return in one week

## 2015-02-25 NOTE — Patient Instructions (Signed)
Follow up on ulcer care. Wound debrided and padded. Return in one week.

## 2015-03-04 ENCOUNTER — Encounter: Payer: Self-pay | Admitting: Podiatry

## 2015-03-04 ENCOUNTER — Ambulatory Visit (INDEPENDENT_AMBULATORY_CARE_PROVIDER_SITE_OTHER): Payer: Medicare Other | Admitting: Podiatry

## 2015-03-04 DIAGNOSIS — L97502 Non-pressure chronic ulcer of other part of unspecified foot with fat layer exposed: Secondary | ICD-10-CM | POA: Diagnosis not present

## 2015-03-04 DIAGNOSIS — E11610 Type 2 diabetes mellitus with diabetic neuropathic arthropathy: Secondary | ICD-10-CM | POA: Diagnosis not present

## 2015-03-04 NOTE — Patient Instructions (Signed)
Seen for recurring ulcer left foot.  Ulcer debrided and padded. Return in 1 week.

## 2015-03-04 NOTE — Progress Notes (Signed)
Subjective:  74 year old male presents for follow up on left foot ulcer.  Doing dressing change as instructed every 2-3 days.   Objective:  No change in ulcer with open base size is 1.0 cm in diameter with granulating tissue at outer 1/2 of surface.  Superficial skin damage from tape, a dime size skin is red and bruised.  Mild inflammation surrounding the lesion. Minimum drainage in center.  Protruding mid tarsal bone with plantar ulcer formation on left foot. Right great toe lesion is heavy with recurring callus.  Pedal pulses are faintly palpable.   Assessment:  No measurable change in wound size and appearance. Chronic left foot ulcer with wet base and minimum granulating tissue. (Patient is on anti cancer medication). Superficial skin damage from tape. Charcot foot left. Thick recurring callus under right great toe.    Plan:  Debrided.devitalized tissue from wound edge.  Aperture pad applied.  Continue with dressing change every 2-3 days.  Return in one week.

## 2015-03-11 ENCOUNTER — Ambulatory Visit: Payer: Medicare Other | Admitting: Podiatry

## 2015-03-18 ENCOUNTER — Encounter: Payer: Self-pay | Admitting: Podiatry

## 2015-03-18 ENCOUNTER — Ambulatory Visit (INDEPENDENT_AMBULATORY_CARE_PROVIDER_SITE_OTHER): Payer: Medicare Other | Admitting: Podiatry

## 2015-03-18 DIAGNOSIS — E11610 Type 2 diabetes mellitus with diabetic neuropathic arthropathy: Secondary | ICD-10-CM | POA: Diagnosis not present

## 2015-03-18 DIAGNOSIS — L97402 Non-pressure chronic ulcer of unspecified heel and midfoot with fat layer exposed: Secondary | ICD-10-CM

## 2015-03-18 NOTE — Patient Instructions (Signed)
Follow up on left foot ulcer. Debrided and dressed with aperture pad. Do dressing change every other day.

## 2015-03-18 NOTE — Progress Notes (Signed)
Subjective:  74 year old male presents for follow up on left foot ulcer. Getting biopsy done next week for new mass under left cheek.  Doing dressing change as instructed every 2-3 days.   Objective:  Increased ulcer size. Opening is 1.2 cm in diameter with clear serosanguinous drainage.  Mild inflammation along the margins of the lesion. Protruding mid tarsal bone with plantar ulcer formation on left foot. Right great toe lesion is heavy with recurring callus.  Pedal pulses are faintly palpable.   Assessment:  Increased wound size and drainage left plantar ulcer. 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 Charcot foot left. Thick recurring callus under right great toe.    Plan:  Debrided.devitalized tissue from wound edge.  Aperture pad applied.  Increase dressing change to every other day. Return in one week.

## 2015-03-25 ENCOUNTER — Encounter: Payer: Self-pay | Admitting: Podiatry

## 2015-03-25 ENCOUNTER — Ambulatory Visit (INDEPENDENT_AMBULATORY_CARE_PROVIDER_SITE_OTHER): Payer: Medicare Other | Admitting: Podiatry

## 2015-03-25 VITALS — BP 123/64 | HR 63

## 2015-03-25 DIAGNOSIS — E11610 Type 2 diabetes mellitus with diabetic neuropathic arthropathy: Secondary | ICD-10-CM

## 2015-03-25 DIAGNOSIS — L97502 Non-pressure chronic ulcer of other part of unspecified foot with fat layer exposed: Secondary | ICD-10-CM

## 2015-03-25 NOTE — Progress Notes (Signed)
Subjective:  74 year old male presents for follow up on left foot ulcer. He has had biopsy of mass under left cheek and he found that the mass is not palpable. Doing dressing change as instructed every 2-3 days.   Objective:  Increased ulcer size. Opening is 1.2 cm in diameter with clear serosanguinous drainage.  Mild inflammation along the margins of the lesion. Protruding mid tarsal bone with plantar ulcer formation on left foot. Right great toe lesion is heavy with recurring callus.  Pedal pulses are faintly palpable.   Assessment:  Increased wound size and drainage left plantar ulcer. 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 Charcot foot left. Thick recurring callus under right great toe.    Plan:  Debrided.devitalized tissue from wound edge.  Aperture pad applied.  Increase dressing change to every other day. Return in one week.

## 2015-03-25 NOTE — Patient Instructions (Signed)
Seen for left foot lesion. Debrided and dressed. Return in one week.

## 2015-04-01 ENCOUNTER — Encounter: Payer: Self-pay | Admitting: Podiatry

## 2015-04-01 ENCOUNTER — Ambulatory Visit (INDEPENDENT_AMBULATORY_CARE_PROVIDER_SITE_OTHER): Payer: Medicare Other | Admitting: Podiatry

## 2015-04-01 VITALS — BP 133/65 | HR 62

## 2015-04-01 DIAGNOSIS — E11610 Type 2 diabetes mellitus with diabetic neuropathic arthropathy: Secondary | ICD-10-CM

## 2015-04-01 DIAGNOSIS — L97402 Non-pressure chronic ulcer of unspecified heel and midfoot with fat layer exposed: Secondary | ICD-10-CM | POA: Diagnosis not present

## 2015-04-01 NOTE — Progress Notes (Signed)
Subjective:  74 year old male presents for follow up on left foot ulcer. Doing dressing change as instructed every 2-3 days.  He feels the ulcer has increased in size.   Objective:  Increased ulcer size. Opening is 1.2 cm in diameter with clear serosanguinous drainage.  Mild inflammation along the margins of the lesion. Protruding mid tarsal bone with plantar ulcer formation on left foot. Right great toe lesion is heavy with recurring callus.  Pedal pulses are faintly palpable.   Assessment:  Increased wound size and drainage left plantar ulcer. 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 Charcot foot left. Thick recurring callus under right great toe.    Plan:  Debrided.devitalized tissue from wound edge.  Aperture pad applied.  Increase dressing change to every other day. Return in one week.

## 2015-04-01 NOTE — Patient Instructions (Signed)
Seen for left foot ulcer. Noted of increase in size. Do regular dressing change and use boot. Return in one week.

## 2015-04-08 ENCOUNTER — Encounter: Payer: Self-pay | Admitting: Podiatry

## 2015-04-08 ENCOUNTER — Ambulatory Visit (INDEPENDENT_AMBULATORY_CARE_PROVIDER_SITE_OTHER): Payer: Medicare Other | Admitting: Podiatry

## 2015-04-08 DIAGNOSIS — L97502 Non-pressure chronic ulcer of other part of unspecified foot with fat layer exposed: Secondary | ICD-10-CM

## 2015-04-08 DIAGNOSIS — E11610 Type 2 diabetes mellitus with diabetic neuropathic arthropathy: Secondary | ICD-10-CM | POA: Diagnosis not present

## 2015-04-08 NOTE — Patient Instructions (Signed)
Wound care done for chronic non healing ulcer left plantar.

## 2015-04-08 NOTE — Progress Notes (Signed)
Subjective:  74 year old male presents for follow up on left foot ulcer.  Doing dressing change as instructed every 2-3 days.  Denies any new problem.  Objective:  Ulcer size remain same with 1.2 cm in diameter with clear serosanguinous drainage.  Minimum inflammation along the margins of the lesion. Protruding mid tarsal bone with plantar ulcer formation on left foot. Right great toe lesion is heavy with recurring callus.  Pedal pulses are faintly palpable.   Assessment:  Non healing wound with minimum drainage left plantar ulcer. 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 Thick recurring callus under right great toe.    Plan:  Debrided.devitalized tissue from wound edge.  Aperture pad applied.  Increase dressing change to every other day. Return in one week.

## 2015-04-15 ENCOUNTER — Encounter: Payer: Self-pay | Admitting: Podiatry

## 2015-04-15 ENCOUNTER — Ambulatory Visit (INDEPENDENT_AMBULATORY_CARE_PROVIDER_SITE_OTHER): Payer: Medicare Other | Admitting: Podiatry

## 2015-04-15 VITALS — BP 150/77 | HR 61

## 2015-04-15 DIAGNOSIS — L97402 Non-pressure chronic ulcer of unspecified heel and midfoot with fat layer exposed: Secondary | ICD-10-CM

## 2015-04-15 DIAGNOSIS — E11610 Type 2 diabetes mellitus with diabetic neuropathic arthropathy: Secondary | ICD-10-CM | POA: Diagnosis not present

## 2015-04-15 NOTE — Progress Notes (Signed)
Subjective:  74 year old male presents for follow up on left foot ulcer.  Doing dressing change as instructed every 2-3 days.  Stated that he has seen more drainage in the dressing at home.  Objective:  Ulcer size remain same with 1.2 cm in diameter with concave base deep to subfascial layer, and clear serosanguinous drainage.  Minimum inflammation along the margins of the lesion. Protruding mid tarsal bone with plantar ulcer formation on left foot. Right great toe lesion is heavy with recurring callus.  Pedal pulses are faintly palpable.   Assessment:  Non healing wound with minimum drainage left plantar ulcer. 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 Thick recurring callus under right great toe.    Plan:  Debrided.devitalized tissue from wound edge.  Aperture pad applied.  Increase dressing change to every other day. Return in one week.

## 2015-04-15 NOTE — Patient Instructions (Signed)
Seen for ulcer follow up. Debrided and redressed. Return in one week.

## 2015-04-16 ENCOUNTER — Other Ambulatory Visit: Payer: Self-pay | Admitting: Family Medicine

## 2015-04-18 ENCOUNTER — Other Ambulatory Visit: Payer: Self-pay | Admitting: *Deleted

## 2015-04-18 MED ORDER — INSULIN NPH ISOPHANE & REGULAR (70-30) 100 UNIT/ML ~~LOC~~ SUSP: SUBCUTANEOUS | Status: DC

## 2015-04-18 MED ORDER — GLUCOSE BLOOD VI STRP: ORAL_STRIP | Status: AC

## 2015-04-22 ENCOUNTER — Ambulatory Visit: Payer: Medicare Other | Admitting: Podiatry

## 2015-04-23 ENCOUNTER — Ambulatory Visit (INDEPENDENT_AMBULATORY_CARE_PROVIDER_SITE_OTHER): Payer: Medicare Other | Admitting: Podiatry

## 2015-04-23 ENCOUNTER — Encounter: Payer: Self-pay | Admitting: Podiatry

## 2015-04-23 VITALS — BP 151/74 | HR 66

## 2015-04-23 DIAGNOSIS — E11610 Type 2 diabetes mellitus with diabetic neuropathic arthropathy: Secondary | ICD-10-CM

## 2015-04-23 DIAGNOSIS — L97502 Non-pressure chronic ulcer of other part of unspecified foot with fat layer exposed: Secondary | ICD-10-CM | POA: Diagnosis not present

## 2015-04-23 NOTE — Patient Instructions (Signed)
Follow up on ulcer left foot. No significant change noted. Dressing change and padding applied. Return in one week.

## 2015-04-23 NOTE — Progress Notes (Signed)
Subjective:  74 year old male presents for follow up on left foot ulcer.  Doing dressing change as instructed every 2-3 days.  His surgery on left chin mass postponed to January.   Objective: No significant changes noted since last visit. Ulcer size remain same with 1.2 cm in diameter with concave base deep to subfascial layer, and clear serosanguinous drainage.  Minimum inflammation along the margins of the lesion. Protruding mid tarsal bone with plantar ulcer formation on left foot. Right great toe lesion is heavy with recurring callus.  Pedal pulses are faintly palpable.   Assessment:  Non healing wound with minimum drainage left plantar ulcer. 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 Thick recurring callus under right great toe.    Plan:  Debrided.devitalized tissue from wound edge.  Aperture pad applied.  Increase dressing change to every other day. Return in one week.

## 2015-04-30 ENCOUNTER — Ambulatory Visit (INDEPENDENT_AMBULATORY_CARE_PROVIDER_SITE_OTHER): Payer: Medicare Other | Admitting: Podiatry

## 2015-04-30 ENCOUNTER — Encounter: Payer: Self-pay | Admitting: Podiatry

## 2015-04-30 DIAGNOSIS — E11610 Type 2 diabetes mellitus with diabetic neuropathic arthropathy: Secondary | ICD-10-CM | POA: Diagnosis not present

## 2015-04-30 DIAGNOSIS — L97502 Non-pressure chronic ulcer of other part of unspecified foot with fat layer exposed: Secondary | ICD-10-CM

## 2015-04-30 NOTE — Patient Instructions (Signed)
Follow up on ulcer care. Continue with every other day dressing change. Return in one week.

## 2015-04-30 NOTE — Progress Notes (Signed)
Subjective:  74 year old male presents for follow up on left foot ulcer. Continuieng with dressing change 3 times/wk.   Objective: No significant changes noted since last visit. Ulcer size remain same with 1.2 cm in diameter with concave base deep to subfascial layer, and clear serosanguinous drainage.  Minimum inflammation along the margins of the lesion. Protruding mid tarsal bone with plantar ulcer formation on left foot. Right great toe lesion has recurring callus with intra dermal bleeding.  Pedal pulses are faintly palpable.   Assessment:  Non healing wound with minimum drainage left plantar ulcer. 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 Thick recurring callus under right great toe without broken skin.    Plan:  Debrided.devitalized tissue from wound edge.  Aperture pad applied.  Continue with dressing change every other day. Return in one week.

## 2015-05-07 ENCOUNTER — Ambulatory Visit: Payer: Medicare Other | Admitting: Podiatry

## 2015-05-13 ENCOUNTER — Ambulatory Visit (INDEPENDENT_AMBULATORY_CARE_PROVIDER_SITE_OTHER): Payer: Medicare Other | Admitting: Podiatry

## 2015-05-13 ENCOUNTER — Encounter: Payer: Self-pay | Admitting: Podiatry

## 2015-05-13 VITALS — BP 133/71 | HR 67

## 2015-05-13 DIAGNOSIS — E11610 Type 2 diabetes mellitus with diabetic neuropathic arthropathy: Secondary | ICD-10-CM

## 2015-05-13 DIAGNOSIS — L97402 Non-pressure chronic ulcer of unspecified heel and midfoot with fat layer exposed: Secondary | ICD-10-CM

## 2015-05-13 NOTE — Progress Notes (Signed)
Subjective:  75 year old male presents for follow up on left foot ulcer. He was sick and missed last week appointment.  Objective:  Ulcer size increased to 1.3 cm in diameter with concave base extending deeper to subfascial layer. There is clear serosanguinous drainage and grey tissue base. Increased inflammation along the margins of the lesion. Protruding mid tarsal bone with plantar ulcer formation on left foot. Right great toe lesion has recurring callus with intra dermal bleeding.  Pedal pulses are faintly palpable.   Assessment:  Non healing wound with increased drainage and size left plantar ulcer. 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 Thick recurring callus under right great toe without broken skin.    Plan:  Debrided.devitalized tissue from wound edge.  Aperture pad applied.  Continue with dressing change every other day. Return in one week.

## 2015-05-13 NOTE — Patient Instructions (Signed)
Seen for follow up on left foot ulcer. Debrided and padded. Return in 1 week.

## 2015-05-20 ENCOUNTER — Encounter: Payer: Self-pay | Admitting: Podiatry

## 2015-05-20 ENCOUNTER — Ambulatory Visit (INDEPENDENT_AMBULATORY_CARE_PROVIDER_SITE_OTHER): Payer: Medicare Other | Admitting: Podiatry

## 2015-05-20 VITALS — BP 147/74 | HR 63

## 2015-05-20 DIAGNOSIS — E1142 Type 2 diabetes mellitus with diabetic polyneuropathy: Secondary | ICD-10-CM | POA: Diagnosis not present

## 2015-05-20 DIAGNOSIS — L97402 Non-pressure chronic ulcer of unspecified heel and midfoot with fat layer exposed: Secondary | ICD-10-CM

## 2015-05-20 DIAGNOSIS — E11610 Type 2 diabetes mellitus with diabetic neuropathic arthropathy: Secondary | ICD-10-CM

## 2015-05-20 MED ORDER — AMOXICILLIN-POT CLAVULANATE 875-125 MG PO TABS: 1.0000 | ORAL_TABLET | Freq: Two times a day (BID) | ORAL | Status: DC

## 2015-05-20 NOTE — Patient Instructions (Signed)
Follow up on left foot ulcer. Will increase dressing change with normal saline twice a day. Take antibiotics. Return in one week.

## 2015-05-20 NOTE — Addendum Note (Signed)
Addended by: Camelia Phenes on: 05/20/2015 02:46 PM   Modules accepted: Orders

## 2015-05-20 NOTE — Progress Notes (Signed)
Subjective:  75 year old male presents for follow up on left foot ulcer.   Objective:  Ulcer size increased to 1.3 cm in diameter with concave base extending deeper to subfascial layer. There is clear serosanguinous drainage and grey tissue base. Increased inflammation along the margins of the lesion. Protruding mid tarsal bone with plantar ulcer formation on left foot. Right great toe lesion has recurring callus with intra dermal bleeding.  Pedal pulses are faintly palpable.   Assessment:  Non healing wound with increased drainage and size left plantar ulcer. 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 Thick recurring callus under right great toe without broken skin.    Plan:  Debrided.devitalized tissue from wound edge.  Aperture pad applied.  Change dressing change twice a day with normal saline. Return in one week.

## 2015-05-27 ENCOUNTER — Ambulatory Visit (INDEPENDENT_AMBULATORY_CARE_PROVIDER_SITE_OTHER): Payer: Medicare Other | Admitting: Podiatry

## 2015-05-27 ENCOUNTER — Encounter: Payer: Self-pay | Admitting: Podiatry

## 2015-05-27 VITALS — BP 155/81 | HR 63

## 2015-05-27 DIAGNOSIS — L97402 Non-pressure chronic ulcer of unspecified heel and midfoot with fat layer exposed: Secondary | ICD-10-CM

## 2015-05-27 DIAGNOSIS — E11610 Type 2 diabetes mellitus with diabetic neuropathic arthropathy: Secondary | ICD-10-CM | POA: Diagnosis not present

## 2015-05-27 NOTE — Progress Notes (Signed)
Subjective:  75 year old male presents for follow up on left foot ulcer. He is doing dressing change twice daily.   Objective:  Minimum change since the last visit. Ulcer size 1.3 cm in diameter with concave base extending deeper to subfascial layer.  There is clear serosanguinous drainage and grey tissue base. No inflammation or edema associated with the wound noted. Protruding mid tarsal bone with plantar ulcer formation on left foot. Right great toe lesion has recurring callus with intra dermal bleeding.  Pedal pulses are faintly palpable.   Assessment:  Non healing wound with minimum change in size left plantar ulcer.  Inflammation subsided since last visit.  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 Thick recurring callus under right great toe without broken skin.    Plan:  Debrided.devitalized tissue from wound edge.  Aperture pad applied.  Continue dressing change twice a day with normal saline. Return in one week.

## 2015-05-27 NOTE — Patient Instructions (Signed)
Seen for left foot ulcer. Debrided and padded. Continue daily wound care with Normal Saline.  Return in 1 week or as needed.

## 2015-06-03 ENCOUNTER — Ambulatory Visit (INDEPENDENT_AMBULATORY_CARE_PROVIDER_SITE_OTHER): Payer: Medicare Other | Admitting: Podiatry

## 2015-06-03 ENCOUNTER — Encounter: Payer: Self-pay | Admitting: Podiatry

## 2015-06-03 ENCOUNTER — Ambulatory Visit: Payer: Medicare Other | Admitting: Podiatry

## 2015-06-03 DIAGNOSIS — E11610 Type 2 diabetes mellitus with diabetic neuropathic arthropathy: Secondary | ICD-10-CM | POA: Diagnosis not present

## 2015-06-03 DIAGNOSIS — L97502 Non-pressure chronic ulcer of other part of unspecified foot with fat layer exposed: Secondary | ICD-10-CM

## 2015-06-03 NOTE — Progress Notes (Signed)
Subjective:  75 year old male presents for follow up on left foot ulcer.  He is doing dressing change twice daily with normal saline as instructed.  He is scheduled to have a surgery on and will be off of cancer medication for 2 weeks.  Objective:  Minimum change since the last visit. Ulcer size 1.3 cm in diameter with concave base with pink granulating tissue at the base. There is clear serosanguinous drainage and minimum grey tissue.  Mild inflammation noted.  No edema associated with the wound noted. Protruding mid tarsal bone with plantar ulcer formation on left foot. Right great toe lesion has recurring callus with intra dermal bleeding.  Pedal pulses are faintly palpable.   Assessment:  Non healing wound with granulating tissue base.  Minimum inflammation. 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 Thick recurring callus under right great toe without broken skin.    Plan:  Debrided.devitalized tissue from wound edge.  Aperture pad applied.  Continue dressing change twice a day with normal saline. Return in one week.

## 2015-06-03 NOTE — Patient Instructions (Signed)
Follow up on ulcer care left foot. Improvement noted, more granulating tissue. Return in one week.

## 2015-06-04 ENCOUNTER — Ambulatory Visit: Payer: Medicare Other | Admitting: Podiatry

## 2015-06-10 ENCOUNTER — Ambulatory Visit: Payer: Medicare Other | Admitting: Podiatry

## 2015-06-11 ENCOUNTER — Ambulatory Visit (INDEPENDENT_AMBULATORY_CARE_PROVIDER_SITE_OTHER): Payer: Medicare Other | Admitting: Podiatry

## 2015-06-11 ENCOUNTER — Encounter: Payer: Self-pay | Admitting: Podiatry

## 2015-06-11 DIAGNOSIS — E11610 Type 2 diabetes mellitus with diabetic neuropathic arthropathy: Secondary | ICD-10-CM

## 2015-06-11 DIAGNOSIS — L97402 Non-pressure chronic ulcer of unspecified heel and midfoot with fat layer exposed: Secondary | ICD-10-CM | POA: Diagnosis not present

## 2015-06-11 NOTE — Progress Notes (Signed)
Subjective:  76 year old male presents for follow up on left foot ulcer.  He has had tumor removed from left cheek last week. He is doing dressing change twice daily with normal saline as instructed.   Objective:  Minimum change since the last visit. Ulcer size 1.3 cm in diameter with concave base with pink granulating tissue along the walls of the lesion with 0.4cm white/grey discolored base. There is clear serosanguinous drainage and minimum grey tissue.  Mild inflammation noted.  No edema associated with the wound noted. Protruding mid tarsal bone with plantar ulcer formation on left foot. Right great toe lesion has recurring callus with intra dermal bleeding.  Pedal pulses are faintly palpable.   Assessment:  Non healing wound with granulating tissue base.  Minimum inflammation. 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 Thick recurring callus under right great toe without broken skin.    Plan:  Debrided.devitalized tissue from wound edge.  Aperture pad applied.  Continue dressing change twice a day with normal saline. Placed in wedged shoe with cut out area for the ulcer site. Continue daily wound care and return in one week.

## 2015-06-17 ENCOUNTER — Ambulatory Visit (INDEPENDENT_AMBULATORY_CARE_PROVIDER_SITE_OTHER): Payer: Medicare Other | Admitting: Podiatry

## 2015-06-17 ENCOUNTER — Encounter: Payer: Self-pay | Admitting: Podiatry

## 2015-06-17 VITALS — BP 140/77 | HR 74

## 2015-06-17 DIAGNOSIS — E11610 Type 2 diabetes mellitus with diabetic neuropathic arthropathy: Secondary | ICD-10-CM | POA: Diagnosis not present

## 2015-06-17 DIAGNOSIS — L97402 Non-pressure chronic ulcer of unspecified heel and midfoot with fat layer exposed: Secondary | ICD-10-CM

## 2015-06-17 NOTE — Progress Notes (Signed)
Subjective:  75 year old male presents for follow up on left foot ulcer.  He has had tumor removed from left cheek and the findings were non cancerous.  He is on wedged heel shoe to rake pressure off of mid foot on left. He is doing dressing change twice daily with normal saline and Amerigel ointment as instructed.   Objective:  Minimum change since the last visit. Ulcer size 1.3 cm in diameter crate with concave base with pink granulating tissue along the walls of the lesion with 0.4cm white/grey discolored base. There is clear serosanguinous drainage and minimum grey tissue.  Mild inflammation noted.  No edema associated with the wound noted. Protruding mid tarsal bone with plantar ulcer formation on left foot. Right great toe lesion has recurring callus with intra dermal bleeding.  Pedal pulses are faintly palpable.   Assessment:  Non healing wound with pink granulating tissue at surrounding crate and possible exposed subfascial tissue base in yellow grey. Minimum inflammation. 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 Thick recurring callus under right great toe without broken skin.    Plan:  Debrided.devitalized tissue from wound edge.  Aperture pad applied.  Continue dressing change twice a day with normal saline. Continue with wedged shoe and daily wound care and return in one week. Discussed possible benefit from wound VAC treatment where it is available. Discussed possible [facility] care center location. Patient agreed to be referred to [facility] wound care center in [facility].  Will make arrangement.

## 2015-06-17 NOTE — Patient Instructions (Signed)
Follow up on left foot ulcer. Slow healing ulcer. Will try wound VAC system thru [facility]. Return in one week.

## 2015-06-24 ENCOUNTER — Ambulatory Visit (INDEPENDENT_AMBULATORY_CARE_PROVIDER_SITE_OTHER): Payer: Medicare Other | Admitting: Podiatry

## 2015-06-24 ENCOUNTER — Encounter: Payer: Self-pay | Admitting: Podiatry

## 2015-06-24 DIAGNOSIS — L97502 Non-pressure chronic ulcer of other part of unspecified foot with fat layer exposed: Secondary | ICD-10-CM | POA: Diagnosis not present

## 2015-06-24 DIAGNOSIS — E11610 Type 2 diabetes mellitus with diabetic neuropathic arthropathy: Secondary | ICD-10-CM | POA: Diagnosis not present

## 2015-06-24 MED ORDER — AMOXICILLIN-POT CLAVULANATE 875-125 MG PO TABS: 1.0000 | ORAL_TABLET | Freq: Two times a day (BID) | ORAL | Status: DC

## 2015-06-24 NOTE — Progress Notes (Signed)
Subjective:  75 year old male presents for follow up on left foot ulcer.  He has had tumor removed from left cheek 2 weeks ago and he has noted of some drainage and wants to know if the area is infected.  He is on wedged heel shoe to rake pressure off of mid foot on left. He is doing dressing change twice daily with normal saline and Amerigel ointment as instructed.  He is also in process of being referred out to [facility] wound care center to receive WOUND VAC treatment.   Objective:  Minimum change since the last visit. Ulcer size widened to 1.4 cm in diameter crate with concave base with pink granulating tissue along the walls of the lesion with 0.4cm white/grey discolored base remain same. Possible exposed inner plantar fibrous tissue in the center.  There is clear serosanguinous drainage and minimum grey tissue. Inflammation is minimum/.  No edema associated with the wound noted. Charcot foot with protruding mid tarsal bone with plantar ulcer formation on left foot. Right great toe lesion has recurring callus with intra dermal bleeding.  Pedal pulses are faintly palpable.  Left cheek incision is healed well except that posterior 1/4 of incision is surrounded with raised firm pink flat dermis with dark discolored cystic bubble (0.3cm) in most posterior corner of the wound. The area is dry. No gapping or active drainage noted. No spreading cellulitis noted. No associated edema noted other than raised dermis at the posterior part of the incision.  Assessment:  Non healing wound with pink granulating tissue at surrounding crate and possible exposed subfascial tissue base in yellow grey. Minimum inflammation. 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 Thick recurring callus under right great toe without broken skin.  No active infection noted on the neck incision.   Plan:  Debrided.devitalized tissue from wound edge.  Aperture pad  applied.  Continue dressing change twice a day with normal saline. Continue with wedged shoe and daily wound care and return in one week. Augmentin prescribed for questionable wound healing on incision site left cheek as per request. Referral is being arranged with [facility] medical wound care center. Patient will go [facility] in [facility] as soon as the appointment is made.

## 2015-06-24 NOTE — Patient Instructions (Signed)
Follow up on left foot ulcer. Continue with daily wound care. Referral in progress to receive WOUND VAC treatment.

## 2015-07-08 ENCOUNTER — Ambulatory Visit: Payer: Medicare Other | Admitting: Podiatry

## 2015-08-01 ENCOUNTER — Encounter: Payer: Self-pay | Admitting: Family Medicine

## 2015-08-01 ENCOUNTER — Ambulatory Visit (INDEPENDENT_AMBULATORY_CARE_PROVIDER_SITE_OTHER): Payer: Medicare Other | Admitting: Family Medicine

## 2015-08-01 VITALS — BP 138/60 | HR 80 | Temp 98.8°F | Wt 222.0 lb

## 2015-08-01 DIAGNOSIS — R509 Fever, unspecified: Secondary | ICD-10-CM

## 2015-08-01 DIAGNOSIS — R05 Cough: Secondary | ICD-10-CM | POA: Diagnosis not present

## 2015-08-01 DIAGNOSIS — R059 Cough, unspecified: Secondary | ICD-10-CM

## 2015-08-01 DIAGNOSIS — J029 Acute pharyngitis, unspecified: Secondary | ICD-10-CM | POA: Diagnosis not present

## 2015-08-01 LAB — POCT RAPID STREP A (OFFICE): RAPID STREP A SCREEN: NEGATIVE

## 2015-08-01 MED ORDER — AZITHROMYCIN 250 MG PO TABS: ORAL_TABLET | ORAL | Status: DC

## 2015-08-01 MED ORDER — OXICONAZOLE NITRATE 1 % EX CREA: 1.0000 "application " | TOPICAL_CREAM | CUTANEOUS | Status: DC | PRN

## 2015-08-01 NOTE — Progress Notes (Signed)
PCP: Eulas Post, MD  Subjective:  Frank Ashley is a 75 y.o. year old very pleasant male patient who presents with  See problem oriented charting  ROS-denies SOB, NV, tooth pain. Minimal dental pain. Some fatigue  Pertinent Past Medical History-  Patient Active Problem List   Diagnosis Date Noted  . Acquired hammer toe of right foot 03/11/2014  . Ulcer of foot, chronic ([place]) 02/18/2014  . Charcot's joint of foot due to diabetes ([place]) 01/11/2014  . Metastatic renal cell carcinoma ([place]) 11/27/2013  . Obesity (BMI 30-39.9) 05/15/2013  . Anxiety 01/21/2012  . Biliary drain displacement - T-tube replaced 01/01/2012  . Ejection fraction   . CAD (coronary artery disease)   . Hypertension   . Type 2 diabetes, controlled, with peripheral neuropathy ([place])   . Hyperlipidemia   . Right renal mass 09/24/2011  . Bacteremia due to Escherichia coli 09/18/2011  . Ascending cholangitis 09/14/2011  . 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 Overweight(278.02) 10/04/2008  . SLEEP APNEA 10/04/2008  . Ulcer of heel and midfoot ([place]) 08/01/2008  . HYPERLIPIDEMIA 07/24/2008  . PERIPHERAL NEUROPATHY 07/24/2008  . Allergic rhinitis, cause unspecified 07/24/2008  . GERD 07/24/2008  . Chronic kidney disease, unspecified ([place]) 07/24/2008   Medications- reviewed  Current Outpatient Prescriptions  Medication Sig Dispense Refill  . Alum & Mag Hydroxide-Simeth (MAGIC MOUTHWASH W/LIDOCAINE) SOLN Take 10 mLs by mouth 4 (four) times daily as needed for mouth pain. 240 mL 0  . amLODipine (NORVASC) 10 MG tablet Take 10 mg by mouth daily.     [name] aspirin 325 MG EC tablet Take 325 mg by mouth daily.     [name] atorvastatin (LIPITOR) 20 MG tablet Take 20 mg by mouth daily.    . becaplermin (REGRANEX) 0.01 % gel Apply 1 application topically daily. 15 g 0  . benzonatate (TESSALON) 200 MG capsule Take 1 capsule (200 mg total) by mouth 2 (two) times daily as needed for cough. 30 capsule 0  . gabapentin (NEURONTIN) 800 MG tablet TAKE  ONE TABLET  BY MOUTH FIVE TIMES DAILY. 450 tablet 3  . glucose blood (TRUETRACK TEST) test strip USE ONE STRIP TO CHECK GLUCOSE THREE TIMES DAILY       DX E11.9 100 each 3  . insulin NPH-regular Human (NOVOLIN 70/30 RELION) (70-30) 100 UNIT/ML injection INJECT 80 UNITS SUBCUTANEOUSLY IN THE MORNING AND 70 IN THE EVENING DX E11.9 140 mL 0  . omeprazole (PRILOSEC) 40 MG capsule Take 40 mg by mouth 2 (two) times daily.    [name] oxiconazole (OXISTAT) 1 % CREA Apply 1 application topically as needed. For SKIN RASHES 60 each 0  . pazopanib (VOTRIENT) 200 MG tablet Take 4 tablets a day    . triamcinolone cream (KENALOG) 0.1 % Apply topically 2 (two) times daily. Compound with Eucerin 1:1 and apply to affected area bid prn itching 454 g 1  . carvedilol (COREG) 12.5 MG tablet Take 1 tablet (12.5 mg total) by mouth 2 (two) times daily with a meal. 60 tablet 5  . LORazepam (ATIVAN) 0.5 MG tablet Take 1 tablet (0.5 mg total) by mouth every 6 (six) hours as needed for anxiety. (Patient not taking: Reported on 08/01/2015) 120 tablet 3  . meclizine (ANTIVERT) 25 MG tablet Take 25 mg by mouth as needed. Reported on 08/01/2015    . traMADol (ULTRAM) 50 MG tablet Take 1 tablet (50 mg total) by mouth every 6 (six) hours as needed. (Patient not taking: Reported on 08/01/2015) 30 tablet 0  No current facility-administered medications for this visit.    Objective: BP 138/60 mmHg  Pulse 80  Temp(Src) 98.8 F (37.1 C)  Wt 222 lb (100.699 kg)  SpO2 98% Gen: NAD, resting comfortably HEENT: Turbinates erythematous but with clear drainage, TM normal, pharynx moderately erythematous with no tonsilar exudate or edema, no sinus tenderness on palpation CV: RRR no murmurs rubs or gallops Lungs: CTAB no crackles, wheeze, rhonchi Ext: no edema Skin: warm, dry, no rash Neuro: grossly normal, moves all extremities  Assessment/Plan:  Cough, sore throat, body aches, diarrhea S: Saturday started feeling bad. Sunday started with  cough, runny nose. Had temperature 101.5 to 102 Sunday afternoon until Tuesday (about 48 hours). Sore throat started at that point, also had some diarrhea. Continues to have diarrhea and body aches, runny nose. Coughing up green phlegm. Day 7 of symptoms. Was improving on Tuesday when fever broke then symptoms worsened. Minimal sinus pressure and congestion.   Has cancer - kidney then lung then adrenal lungs and then October into pancreas. 4 weeks ago went into the liver. Takes 4 chemotherapy medications per day (votrient). 14 days off his chemo medicine as he needs a dental procedure. He really wants to get this procedure done on Tuesday but cannot if he is ill.  Has taken a cough medicine. Has been taking amoxicillin all week long- started taking it on tuesday A/P: Patient potentially with strep throat but on treatment with home amoxicillin for dental procedure so test now potentially negative (fever curve broke after antibiotic). Could be flu or other viral flu like illness or viral gastroenteritis (or result of taking amoxicillin as far as diarrhea)  He has metastatic renal cancer and is off his chemotherapy for his procedure- he asks for additional coverage from infection today to help him recover for his dental procedure. We will add azithromycin which would give extra coverage for sinusitis, potential CAP which I highly doubt. We discussed CXR which he declines at present and also declined flu swab. Return precautions for early next week given as likely immunocompromised.   Asked for refill of oxistat which has helped for rash in groin in past- likes to have for prn use- done today.   Meds ordered this encounter  Medications  . atorvastatin (LIPITOR) 20 MG tablet    Sig: Take 20 mg by mouth daily.  [name] oxiconazole (OXISTAT) 1 % CREA    Sig: Apply 1 application topically as needed. For SKIN RASHES    Dispense:  60 each    Refill:  0  . azithromycin (ZITHROMAX) 250 MG tablet    Sig: Take 2 tabs on  day 1, then 1 tab daily until finished    Dispense:  6 tablet    Refill:  0  high risk patient given active cancer and on chemotherapeutic agents though temporarily held

## 2015-08-01 NOTE — Patient Instructions (Signed)
Unclear cause of infection- likely viral.  AMoxicillin covers you for strep throat Azithromcyin provides double coverage This combination covers pneumonia as well as a good deal of sinusitis.  If this does not improve symptoms- one again this is likely viral.   I would return on Monday or Tuesday if continuing symptoms- you declined flu swab and chest x-ray for now- those are considerations

## 2015-08-12 ENCOUNTER — Telehealth: Payer: Self-pay | Admitting: Family Medicine

## 2015-08-12 ENCOUNTER — Telehealth: Payer: Self-pay | Admitting: General Practice

## 2015-08-12 NOTE — Telephone Encounter (Signed)
PA began with CMM (key # [id number])

## 2015-08-12 NOTE — Telephone Encounter (Signed)
[employer] would like a call back about the following med oxiconazole (OXISTAT) 1 % CREA  Dr Yong Channel wrote the rx     [phone]

## 2015-08-14 NOTE — Telephone Encounter (Signed)
Since not covered- he can use over the counter antifungals or return to see Dr. Elease Hashimoto to discuss

## 2015-08-14 NOTE — Telephone Encounter (Signed)
[employer] called to advise the oxiconazole (OXISTAT) 1 % CREAm Has been denied.   Due to reason requested is not a FDA medically necessary accepted use.  [employer] will send a fax. Any questions, please call [phone]

## 2015-08-14 NOTE — Telephone Encounter (Signed)
FYI

## 2015-10-09 ENCOUNTER — Telehealth: Payer: Self-pay | Admitting: Family Medicine

## 2015-10-09 NOTE — Telephone Encounter (Signed)
This is fine. Have him bring order with him at lab visit.

## 2015-10-09 NOTE — Telephone Encounter (Signed)
Pt went to [facility] on 10/08/15 and his potassium was high they gave medicine to take and would like for him to have a lab drawn and was given an order. Pt has the order and is asking if would be ok for him to have the lab drawn .

## 2015-10-10 NOTE — Telephone Encounter (Signed)
lmovm to call and schedule a lab appt

## 2015-11-13 ENCOUNTER — Emergency Department (HOSPITAL_COMMUNITY)
Admission: EM | Admit: 2015-11-13 | Discharge: 2015-11-13 | Disposition: A | Discharge status: Home / Self Care | Payer: Medicare Other | Attending: Emergency Medicine | Admitting: Emergency Medicine

## 2015-11-13 ENCOUNTER — Encounter (HOSPITAL_COMMUNITY): Payer: Self-pay | Admitting: Emergency Medicine

## 2015-11-13 DIAGNOSIS — E785 Hyperlipidemia, unspecified: Secondary | ICD-10-CM | POA: Insufficient documentation

## 2015-11-13 DIAGNOSIS — Z87891 Personal history of nicotine dependence: Secondary | ICD-10-CM | POA: Diagnosis not present

## 2015-11-13 DIAGNOSIS — I10 Essential (primary) hypertension: Secondary | ICD-10-CM | POA: Insufficient documentation

## 2015-11-13 DIAGNOSIS — Z794 Long term (current) use of insulin: Secondary | ICD-10-CM | POA: Insufficient documentation

## 2015-11-13 DIAGNOSIS — E11649 Type 2 diabetes mellitus with hypoglycemia without coma: Secondary | ICD-10-CM | POA: Diagnosis not present

## 2015-11-13 DIAGNOSIS — Z85528 Personal history of other malignant neoplasm of kidney: Secondary | ICD-10-CM | POA: Diagnosis not present

## 2015-11-13 DIAGNOSIS — I2510 Atherosclerotic heart disease of native coronary artery without angina pectoris: Secondary | ICD-10-CM | POA: Diagnosis not present

## 2015-11-13 DIAGNOSIS — Z7982 Long term (current) use of aspirin: Secondary | ICD-10-CM | POA: Diagnosis not present

## 2015-11-13 DIAGNOSIS — M1990 Unspecified osteoarthritis, unspecified site: Secondary | ICD-10-CM | POA: Diagnosis not present

## 2015-11-13 DIAGNOSIS — E162 Hypoglycemia, unspecified: Secondary | ICD-10-CM

## 2015-11-13 DIAGNOSIS — Z79899 Other long term (current) drug therapy: Secondary | ICD-10-CM | POA: Diagnosis not present

## 2015-11-13 LAB — CBG MONITORING, ED
Glucose-Capillary: 134 mg/dL — ABNORMAL HIGH (ref 65–99)
Glucose-Capillary: 120 mg/dL — ABNORMAL HIGH (ref 65–99)

## 2015-11-13 NOTE — ED Notes (Signed)
Pt given apple sauce, graham crackers, orange juice, and ice water. Meal tray ordered. [employer] at bedside.

## 2015-11-13 NOTE — ED Notes (Signed)
Per EMS pt reports CBG on arrival 31, given glucagon, just prior to arrival at hospital 250. Pt a/o x4 and denies pain. With triage pt states he did not eat much last night and think he may have taken too much insulin.

## 2015-11-13 NOTE — ED Notes (Signed)
Pt awaiting wife arrival with clothes.

## 2015-11-13 NOTE — Discharge Instructions (Signed)

## 2015-11-13 NOTE — ED Notes (Signed)
Bed: [id number] Expected date:  Expected time:  Means of arrival:  Comments: EMS- 75yo M, [place] Pt/hypoglycemia

## 2015-11-13 NOTE — ED Provider Notes (Signed)
CSN: [id number]     Arrival date & time 11/13/15  1002 History   First MD Initiated Contact with Patient 11/13/15 1016     Chief Complaint  Patient presents with  . Hypoglycemia     (Consider location/radiation/quality/duration/timing/severity/associated sxs/prior Treatment) HPI   75yM with hypoglycemia. Had unusually small dinner for him last night. Took meds as normally does. This morning very fatigued and didn't feel right. EMS had CBG of 31 on their arrival. He feels much better now with correction of his glucose. He has no further complaints. He denies recent meds changes. He denies recurrent episodes of hypoglycemia.   Past Medical History  Diagnosis Date  . Hyperlipidemia   . Renal mass, right   . Diabetes mellitus type 2, insulin dependent ([place])   . Diabetic foot ulcer ([place]) CURRENT RIGHT GREAT TOE WOUND W/ DRAINAGE --  DRESSING CHANGE EVERY OTHER DAY  . History of acute pancreatitis 2008  . OSA (obstructive sleep apnea) CPAP USED UP UNTIL 2 YRS AGO  WHEN MASK WAS DAMAGED   . Renal insufficiency   . GERD (gastroesophageal reflux disease)   . Peripheral neuropathy ([place])   . Left leg numbness COMPLETE NUMBNESS FROM HIP DOWN TO FOOT  . Numbness in right leg FROM KNEE DOWN TO FOOT  . History of kidney stones AGE 52  . BPH (benign prostatic hypertrophy)   . Choledocholithiasis with chronic cholecystitis     BILE DUCT STONES AND GALLSTONES  . History of gout STABLE PER PT  . Arthritis HANDS  . History of MRSA infection 10 YRS AGO /  SUPERFICIAL SKIN AREA  . Generalized rash RIGHT SHOULDER/ BACK AREA  . Nocturia   . Frequency of urination   . Hematuria     YRS AGO  . Hypertension   . History of peptic ulcer disease   . CAD (coronary artery disease)     Circumflex stent 1996  //  catheterization January, 2000, 50-60% mid LAD,  dominant circumflex-50% in-stent restenosis similar to a cath in 1999, RCA small and nondominant,, EF 60%  . Complication of anesthesia clostraphobic   . Preop cardiovascular exam     Cardiac clearance for nephrectomy and cholecystectomy June, 2013  . Ejection fraction     EF 60%, catheterization, 2000  //   EF 55-60%, echo, July, 2013  . Anxiety     SINCE MAY 2013 AND DIAGNOSIS OF CANCER-PT WAS HAVING PANIC ATTACKS-FELT LIKE HE COULDN'T BREATHE--STATES HE IS FEELING BETTER ON ALPRAZOLAM  . 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 Acute pancreatitis 03/12/2010    Qualifier: Diagnosis of  By: Elease Hashimoto MD, Bruce    . DIABETIC FOOT ULCER, TOE 08/01/2008    Qualifier: Diagnosis of  By: Elease Hashimoto MD, Bruce     Past Surgical History  Procedure Laterality Date  . Penile prosthesis implant  2008    REMOVED 2010 DUE TO MALFUNCTION  . Esophagogastroduodenoscopy  09/16/2011    Procedure: ESOPHAGOGASTRODUODENOSCOPY (EGD);  Surgeon: Missy Sabins, MD;  Location: [facility];  Service: Endoscopy;  Laterality: N/A;  . Ct perc cholecystostomy  09-17-2011  . Right great toe surg.  2000  . Surg for repair of perferated peptic ulcer  AGE 42  . Bilroth ii procedure  AGE 24    2 MONTHS LATER,  BILROTH II RECONSTRUCTION  . Coronary angioplasty with stent placement  1996    STENTING X1 CIRCUMFLEX  . Cardiac catheterization  05-04-1998--- REPORT W/ CHART  50%  IN-STENT RESTENOSIS OF CIRCUMFLEX/ 20% LEFT MAIN/ 50-60% LAD/ RCA NORMAL/ LV NORMAL / EF 60%  . Cataract extraction w/ intraocular lens  implant, bilateral  2005  . Cystoscopy/retrograde/ureteroscopy  10/26/2011    Procedure: CYSTOSCOPY/RETROGRADE/URETEROSCOPY;  Surgeon: Fredricka Bonine, MD;  Location[facility];  Service: Urology;  Laterality: N/A;  RIGHT URETEROSCOPY AND RIGHT RETROGRADE PYELOGRAM  DIABETIC C ARM   . Removal of penile prosthesis    . Cholecystectomy  12/20/2011    Procedure: CHOLECYSTECTOMY WITH COMMON DUCT EXPLORATION;  Surgeon: Marcello Moores A. Cornett, MD;  Location: [facility];  Service: General;  Laterality: N/A;  choloductoscopy  .  Intraoperative cholangiogram  12/20/2011    Procedure: INTRAOPERATIVE CHOLANGIOGRAM;  Surgeon: Marcello Moores A. Cornett, MD;  Location: [facility];  Service: General;;  . Partial nephrectomy  12/20/2011    Procedure: NEPHRECTOMY PARTIAL;  Surgeon: Fredricka Bonine, MD;  Location: [facility];  Service: Urology;  Laterality: Right;  Right Partial nephrectomy  . Laparotomy  12/24/2011    Procedure: EXPLORATORY LAPAROTOMY;  Surgeon: Joyice Faster. Cornett, MD;  Location: [facility];  Service: General;  Laterality: N/A;  Replacement of T-tube and Bile duct exploration  . Intraoperative cholangiogram  12/24/2011    Procedure: INTRAOPERATIVE CHOLANGIOGRAM;  Surgeon: Marcello Moores A. Cornett, MD;  Location: [facility];  Service: General;  Laterality: N/A;   Family History  Problem Relation Age of Onset  . Colon cancer Mother 32  . Colon cancer Sister 57  . Colon cancer Brother 27  . Stomach cancer Neg Hx    Social History  Substance Use Topics  . Smoking status: Former Smoker -- 0.25 packs/day for 2 years    Types: Cigarettes    Quit date: 02/25/1960  . Smokeless tobacco: Never Used  . Alcohol Use: No    Review of Systems  All systems reviewed and negative, other than as noted in HPI.   Allergies  Codeine; Hydrocodone; Oxycodone; and Sulfa antibiotics  Home Medications   Prior to Admission medications   Medication Sig Start Date End Date Taking? Authorizing Provider  Alum & Mag Hydroxide-Simeth (MAGIC MOUTHWASH W/LIDOCAINE) SOLN Take 10 mLs by mouth 4 (four) times daily as needed for mouth pain. 10/08/14   Eulas Post, MD  amLODipine (NORVASC) 10 MG tablet Take 10 mg by mouth daily.  04/10/13   Historical Provider, MD  aspirin 325 MG EC tablet Take 325 mg by mouth daily.     Historical Provider, MD  atorvastatin (LIPITOR) 20 MG tablet Take 20 mg by mouth daily.    Historical Provider, MD  azithromycin (ZITHROMAX) 250 MG tablet Take 2 tabs on day 1, then 1 tab daily until finished 08/01/15   Marin Olp, MD   becaplermin (REGRANEX) 0.01 % gel Apply 1 application topically daily. 01/25/14   Myeong O Sheard, DPM  benzonatate (TESSALON) 200 MG capsule Take 1 capsule (200 mg total) by mouth 2 (two) times daily as needed for cough. 10/10/14   Eulas Post, MD  carvedilol (COREG) 12.5 MG tablet Take 1 tablet (12.5 mg total) by mouth 2 (two) times daily with a meal. 05/21/14 05/21/15  Eulas Post, MD  gabapentin (NEURONTIN) 800 MG tablet TAKE ONE TABLET  BY MOUTH FIVE TIMES DAILY. 09/27/14   Eulas Post, MD  glucose blood (TRUETRACK TEST) test strip USE ONE STRIP TO CHECK GLUCOSE THREE TIMES DAILY       DX E11.9 04/18/15   Eulas Post, MD  insulin NPH-regular Human (  NOVOLIN 70/30 RELION) (70-30) 100 UNIT/ML injection INJECT 80 UNITS SUBCUTANEOUSLY IN THE MORNING AND 70 IN THE EVENING DX E11.9 04/18/15   Eulas Post, MD  LORazepam (ATIVAN) 0.5 MG tablet Take 1 tablet (0.5 mg total) by mouth every 6 (six) hours as needed for anxiety. Patient not taking: Reported on 08/01/2015 06/28/14   Eulas Post, MD  meclizine (ANTIVERT) 25 MG tablet Take 25 mg by mouth as needed. Reported on 08/01/2015 01/28/14   Historical Provider, MD  omeprazole (PRILOSEC) 40 MG capsule Take 40 mg by mouth 2 (two) times daily.    Historical Provider, MD  oxiconazole (OXISTAT) 1 % CREA Apply 1 application topically as needed. For SKIN RASHES 08/01/15   Marin Olp, MD  pazopanib (VOTRIENT) 200 MG tablet Take 4 tablets a day 01/31/13   Historical Provider, MD  traMADol (ULTRAM) 50 MG tablet Take 1 tablet (50 mg total) by mouth every 6 (six) hours as needed. Patient not taking: Reported on 08/01/2015 05/21/14   Eulas Post, MD  triamcinolone cream (KENALOG) 0.1 % Apply topically 2 (two) times daily. Compound with Eucerin 1:1 and apply to affected area bid prn itching 04/03/12   Eulas Post, MD   BP 127/78 mmHg  Pulse 72  Temp(Src) 97.9 F (36.6 C) (Oral)  Resp 16  SpO2 98% Physical Exam   Constitutional: He appears well-developed and well-nourished. No distress.  HENT:  Head: Normocephalic and atraumatic.  Eyes: Conjunctivae are normal. Right eye exhibits no discharge. Left eye exhibits no discharge.  Neck: Neck supple.  Cardiovascular: Normal rate, regular rhythm and normal heart sounds.  Exam reveals no gallop and no friction rub.   No murmur heard. Pulmonary/Chest: Effort normal and breath sounds normal. No respiratory distress.  Abdominal: Soft. He exhibits no distension. There is no tenderness.  Musculoskeletal: He exhibits no edema or tenderness.  Neurological: He is alert.  Skin: Skin is warm and dry.  Psychiatric: He has a normal mood and affect. His behavior is normal. Thought content normal.  Nursing note and vitals reviewed.   ED Course  Procedures (including critical care time) Labs Review Labs Reviewed  CBG MONITORING, ED - Abnormal; Notable for the following:    Glucose-Capillary 134 (*)    All other components within normal limits  CBG MONITORING, ED - Abnormal; Notable for the following:    Glucose-Capillary 120 (*)    All other components within normal limits    Imaging Review No results found. I have personally reviewed and evaluated these images and lab results as part of my medical decision-making.   EKG Interpretation None      MDM   Final diagnoses:  Hypoglycemia    75yM with hypoglycemia. Probably related to very small dinner last night. Now no complaints. Glucose stable. He reports no significant hx of recurrent hypoglycemia. I feel he is appropriate for discharge at this time.Outpt FU to discuss further if he repeat events.     Virgel Manifold, MD 11/17/15 [phone]

## 2016-01-12 ENCOUNTER — Emergency Department (HOSPITAL_COMMUNITY)
Admission: EM | Admit: 2016-01-12 | Discharge: 2016-01-12 | Disposition: A | Discharge status: Home / Self Care | Payer: Medicare Other | Attending: Emergency Medicine | Admitting: Emergency Medicine

## 2016-01-12 ENCOUNTER — Emergency Department (HOSPITAL_COMMUNITY): Payer: Medicare Other

## 2016-01-12 ENCOUNTER — Encounter (HOSPITAL_COMMUNITY): Payer: Self-pay | Admitting: Emergency Medicine

## 2016-01-12 DIAGNOSIS — Z7982 Long term (current) use of aspirin: Secondary | ICD-10-CM | POA: Insufficient documentation

## 2016-01-12 DIAGNOSIS — R410 Disorientation, unspecified: Secondary | ICD-10-CM | POA: Diagnosis not present

## 2016-01-12 DIAGNOSIS — Z794 Long term (current) use of insulin: Secondary | ICD-10-CM | POA: Diagnosis not present

## 2016-01-12 DIAGNOSIS — E119 Type 2 diabetes mellitus without complications: Secondary | ICD-10-CM | POA: Diagnosis not present

## 2016-01-12 DIAGNOSIS — Z87891 Personal history of nicotine dependence: Secondary | ICD-10-CM | POA: Insufficient documentation

## 2016-01-12 DIAGNOSIS — R5381 Other malaise: Secondary | ICD-10-CM | POA: Diagnosis not present

## 2016-01-12 DIAGNOSIS — Z79899 Other long term (current) drug therapy: Secondary | ICD-10-CM | POA: Insufficient documentation

## 2016-01-12 DIAGNOSIS — I10 Essential (primary) hypertension: Secondary | ICD-10-CM | POA: Insufficient documentation

## 2016-01-12 DIAGNOSIS — I2510 Atherosclerotic heart disease of native coronary artery without angina pectoris: Secondary | ICD-10-CM | POA: Insufficient documentation

## 2016-01-12 DIAGNOSIS — R4182 Altered mental status, unspecified: Secondary | ICD-10-CM | POA: Diagnosis present

## 2016-01-12 LAB — URINE MICROSCOPIC-ADD ON

## 2016-01-12 LAB — COMPREHENSIVE METABOLIC PANEL
ALK PHOS: 190 U/L — AB (ref 38–126)
ALT: 50 U/L (ref 17–63)
ANION GAP: 9 (ref 5–15)
AST: 32 U/L (ref 15–41)
Albumin: 2.3 g/dL — ABNORMAL LOW (ref 3.5–5.0)
BUN: 33 mg/dL — ABNORMAL HIGH (ref 6–20)
CALCIUM: 8.6 mg/dL — AB (ref 8.9–10.3)
CHLORIDE: 99 mmol/L — AB (ref 101–111)
CO2: 24 mmol/L (ref 22–32)
Creatinine, Ser: 2.47 mg/dL — ABNORMAL HIGH (ref 0.61–1.24)
GFR calc non Af Amer: 24 mL/min — ABNORMAL LOW (ref >60)
GFR, EST AFRICAN AMERICAN: 28 mL/min — AB (ref >60)
Glucose, Bld: 192 mg/dL — ABNORMAL HIGH (ref 65–99)
Potassium: 5.1 mmol/L (ref 3.5–5.1)
SODIUM: 132 mmol/L — AB (ref 135–145)
Total Bilirubin: 0.7 mg/dL (ref 0.3–1.2)
Total Protein: 5.9 g/dL — ABNORMAL LOW (ref 6.5–8.1)

## 2016-01-12 LAB — CBC WITH DIFFERENTIAL/PLATELET
BASOS ABS: 0 10*3/uL (ref 0.0–0.1)
BASOS PCT: 0 %
EOS ABS: 0.1 10*3/uL (ref 0.0–0.7)
EOS PCT: 2 %
HCT: 28.1 % — ABNORMAL LOW (ref 39.0–52.0)
Hemoglobin: 9.1 g/dL — ABNORMAL LOW (ref 13.0–17.0)
LYMPHS PCT: 14 %
Lymphs Abs: 0.6 10*3/uL — ABNORMAL LOW (ref 0.7–4.0)
MCH: 28.7 pg (ref 26.0–34.0)
MCHC: 32.4 g/dL (ref 30.0–36.0)
MCV: 88.6 fL (ref 78.0–100.0)
Monocytes Absolute: 0.7 10*3/uL (ref 0.1–1.0)
Monocytes Relative: 16 %
Neutro Abs: 2.8 10*3/uL (ref 1.7–7.7)
Neutrophils Relative %: 68 %
Platelets: 223 10*3/uL (ref 150–400)
RBC: 3.17 MIL/uL — AB (ref 4.22–5.81)
RDW: 14.2 % (ref 11.5–15.5)
WBC: 4.1 10*3/uL (ref 4.0–10.5)

## 2016-01-12 LAB — URINALYSIS, ROUTINE W REFLEX MICROSCOPIC
Glucose, UA: NEGATIVE mg/dL
Ketones, ur: NEGATIVE mg/dL
NITRITE: NEGATIVE
PROTEIN: 30 mg/dL — AB
Specific Gravity, Urine: 1.029 (ref 1.005–1.030)
pH: 5 (ref 5.0–8.0)

## 2016-01-12 LAB — I-STAT CG4 LACTIC ACID, ED
Lactic Acid, Venous: 2.73 mmol/L (ref 0.5–1.9)
Lactic Acid, Venous: 1.17 mmol/L (ref 0.5–1.9)

## 2016-01-12 LAB — CBG MONITORING, ED: GLUCOSE-CAPILLARY: 181 mg/dL — AB (ref 65–99)

## 2016-01-12 MED ORDER — SODIUM CHLORIDE 0.9 % IV BOLUS (SEPSIS)
1000.0000 mL | Freq: Once | INTRAVENOUS | Status: AC
  Administered 2016-01-12: 1000 mL via INTRAVENOUS

## 2016-01-12 NOTE — ED Notes (Signed)
Pt returned from CT via stretcher.

## 2016-01-12 NOTE — ED Notes (Signed)
Pt ambulated in hallway with minimal assistance. Pt is   A & O requesting to be d/c. Pt able to ambulate back to room and get back onto the stretcher with no assistance.

## 2016-01-12 NOTE — ED Notes (Signed)
Repeat Lactic Acid obtained. Family at bedside very concerned about taking pt home and he not being able to support his own weight. Son Angelos Ottmar [phone]) states pt is normally very independent and tonight pt could not assist in getting himself off the floor. Family concerned regarding patients safety if pt is discharged home

## 2016-01-12 NOTE — ED Provider Notes (Signed)
[facility] DEPT Provider Note   CSN: [id number] Arrival date & time: 01/12/16  0004  By signing my name below, I, Gwenlyn Fudge, attest that this documentation has been prepared under the direction and in the presence of Merryl Hacker, MD. Electronically Signed: Gwenlyn Fudge, ED Scribe. 01/12/16. 1:03 AM.   History   Chief Complaint Chief Complaint  Patient presents with  . Altered Mental Status   The history is provided by the patient, a relative and the spouse. No language interpreter was used.    HPI Comments: Frank Ashley is a 75 y.o. male with PMHx of DM who presents to the Emergency Department complaining of gradual onset confusion onset earlier tonight. Pt has associated decreased appetite, and Fatigue. Pt recently had an operation to cut off blood supply to a portion of his kidney to decrease a renal mass. Pt struggled to put on his shoes and was found without clothes on the floor. Wife states that he has been in bed ever since discharge on Saturday. When he got up from bed he was unable to support his weight. He had an Pt has been taking his own medication and family is unaware of Whether he took too much of his pain medication. He has become more aware since arriving in the ED. He has not taken any pain medications since 11 AM this morning. He does not use any oxygen at home.  Of note, chart review reveals embolism of the right renal system for renal cell carcinoma. He has metastatic renal cell carcinoma. He is on prophylactic antibiotics following his procedure.  Past Medical History:  Diagnosis Date  . Acute pancreatitis 03/12/2010   Qualifier: Diagnosis of  By: Elease Hashimoto MD, Bruce    . Anxiety    SINCE MAY 2013 AND DIAGNOSIS OF CANCER-PT WAS HAVING PANIC ATTACKS-FELT LIKE HE COULDN'T BREATHE--STATES HE IS FEELING BETTER ON ALPRAZOLAM  . Arthritis HANDS  . BPH (benign prostatic hypertrophy)   . CAD (coronary artery disease)    Circumflex stent 1996  //   catheterization January, 2000, 50-60% mid LAD,  dominant circumflex-50% in-stent restenosis similar to a cath in 1999, RCA small and nondominant,, EF 60%  . Cancer [facility])    CANCER IN RIGHT KIDNEY-SURGERY PLANNED  . Choledocholithiasis with chronic cholecystitis    BILE DUCT STONES AND GALLSTONES  . Complication of anesthesia clostraphobic  . Diabetes mellitus type 2, insulin dependent ([place])   . Diabetic foot ulcer ([place]) CURRENT RIGHT GREAT TOE WOUND W/ DRAINAGE --  DRESSING CHANGE EVERY OTHER DAY  . DIABETIC FOOT ULCER, TOE 08/01/2008   Qualifier: Diagnosis of  By: Elease Hashimoto MD, Bruce    . Ejection fraction    EF 60%, catheterization, 2000  //   EF 55-60%, echo, July, 2013  . Frequency of urination   . Generalized rash RIGHT SHOULDER/ BACK AREA  . GERD (gastroesophageal reflux disease)   . Hematuria    YRS AGO  . History of acute pancreatitis 2008  . History of gout STABLE PER PT  . History of kidney stones AGE 37  . History of MRSA infection 10 YRS AGO /  SUPERFICIAL SKIN AREA  . History of peptic ulcer disease   . Hyperlipidemia   . Hypertension   . Left leg numbness COMPLETE NUMBNESS FROM HIP DOWN TO FOOT  . Nocturia   . Numbness in right leg FROM KNEE DOWN TO FOOT  . OSA (obstructive sleep apnea) CPAP USED UP UNTIL 2 YRS AGO  WHEN MASK  WAS DAMAGED   . Peripheral neuropathy ([place])   . Preop cardiovascular exam    Cardiac clearance for nephrectomy and cholecystectomy June, 2013  . Renal insufficiency   . Renal mass, right     Patient Active Problem List   Diagnosis Date Noted  . Acquired hammer toe of right foot 03/11/2014  . Ulcer of foot, chronic ([place]) 02/18/2014  . 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 Metastatic renal cell carcinoma ([place]) 11/27/2013  . Obesity (BMI 30-39.9) 05/15/2013  . Anxiety 01/21/2012  . Biliary drain displacement - T-tube replaced 01/01/2012  . Ejection fraction   . CAD (coronary artery disease)   . Hypertension   . Type 2  diabetes, controlled, with peripheral neuropathy ([place])   . Hyperlipidemia   . Right renal mass 09/24/2011  . Bacteremia due to Escherichia coli 09/18/2011  . Ascending cholangitis 09/14/2011  . 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 Overweight(278.02) 10/04/2008  . SLEEP APNEA 10/04/2008  . Ulcer of heel and midfoot ([place]) 08/01/2008  . HYPERLIPIDEMIA 07/24/2008  . PERIPHERAL NEUROPATHY 07/24/2008  . Allergic rhinitis, cause unspecified 07/24/2008  . GERD 07/24/2008  . Chronic kidney disease, unspecified ([place]) 07/24/2008    Past Surgical History:  Procedure Laterality Date  . BILROTH II PROCEDURE  AGE 56   2 MONTHS LATER,  BILROTH II RECONSTRUCTION  . CARDIAC CATHETERIZATION  05-04-1998--- REPORT W/ CHART   50%  IN-STENT RESTENOSIS OF CIRCUMFLEX/ 20% LEFT MAIN/ 50-60% LAD/ RCA NORMAL/ LV NORMAL / EF 60%  . CATARACT EXTRACTION W/ INTRAOCULAR LENS  IMPLANT, BILATERAL  2005  . CHOLECYSTECTOMY  12/20/2011   Procedure: CHOLECYSTECTOMY WITH COMMON DUCT EXPLORATION;  Surgeon: Marcello Moores A. Cornett, MD;  Location: [facility];  Service: General;  Laterality: N/A;  choloductoscopy  . CORONARY ANGIOPLASTY WITH STENT PLACEMENT  1996   STENTING X1 CIRCUMFLEX  . CT PERC CHOLECYSTOSTOMY  09-17-2011  . CYSTOSCOPY/RETROGRADE/URETEROSCOPY  10/26/2011   Procedure: CYSTOSCOPY/RETROGRADE/URETEROSCOPY;  Surgeon: Fredricka Bonine, MD;  Location[facility];  Service: Urology;  Laterality: N/A;  RIGHT URETEROSCOPY AND RIGHT RETROGRADE PYELOGRAM  DIABETIC C ARM   . ESOPHAGOGASTRODUODENOSCOPY  09/16/2011   Procedure: ESOPHAGOGASTRODUODENOSCOPY (EGD);  Surgeon: Missy Sabins, MD;  Location: [facility];  Service: Endoscopy;  Laterality: N/A;  . INTRAOPERATIVE CHOLANGIOGRAM  12/20/2011   Procedure: INTRAOPERATIVE CHOLANGIOGRAM;  Surgeon: Joyice Faster. Cornett, MD;  Location: [facility];  Service: General;;  . INTRAOPERATIVE CHOLANGIOGRAM  12/24/2011   Procedure: INTRAOPERATIVE CHOLANGIOGRAM;  Surgeon: Joyice Faster. Cornett, MD;   Location: [facility];  Service: General;  Laterality: N/A;  . LAPAROTOMY  12/24/2011   Procedure: EXPLORATORY LAPAROTOMY;  Surgeon: Joyice Faster. Cornett, MD;  Location: [facility];  Service: General;  Laterality: N/A;  Replacement of T-tube and Bile duct exploration  . PARTIAL NEPHRECTOMY  12/20/2011   Procedure: NEPHRECTOMY PARTIAL;  Surgeon: Fredricka Bonine, MD;  Location: [facility];  Service: Urology;  Laterality: Right;  Right Partial nephrectomy  . PENILE PROSTHESIS IMPLANT  2008   REMOVED 2010 DUE TO MALFUNCTION  . REMOVAL OF PENILE PROSTHESIS    . RIGHT GREAT TOE SURG.  2000  . SURG FOR REPAIR OF PERFERATED PEPTIC ULCER  AGE 49       Home Medications    Prior to Admission medications   Medication Sig Start Date End Date Taking? Authorizing Provider  amLODipine (NORVASC) 10 MG tablet Take 10 mg by mouth daily.  04/10/13  Yes Historical Provider, MD  amoxicillin (AMOXIL) 500 MG capsule Take  2,000 mg by mouth See admin instructions. Prior to dental appointment 11/12/15  Yes Historical Provider, MD  aspirin 325 MG EC tablet Take 325 mg by mouth daily.    Yes Historical Provider, MD  atorvastatin (LIPITOR) 20 MG tablet Take 20 mg by mouth daily.   Yes Historical Provider, MD  carvedilol (COREG) 12.5 MG tablet Take 12.5 mg by mouth 2 (two) times daily. 09/10/15  Yes Historical Provider, MD  ciprofloxacin (CIPRO) 500 MG tablet Take 500 mg by mouth 2 (two) times daily.  01/10/16 01/20/16 Yes Historical Provider, MD  gabapentin (NEURONTIN) 800 MG tablet TAKE ONE TABLET  BY MOUTH FIVE TIMES DAILY. 09/27/14  Yes Eulas Post, MD  HYDROmorphone (DILAUDID) 2 MG tablet Take 2 mg by mouth every 4 (four) hours as needed for severe pain.  01/10/16  Yes Historical Provider, MD  insulin NPH-regular Human (NOVOLIN 70/30 RELION) (70-30) 100 UNIT/ML injection INJECT 80 UNITS SUBCUTANEOUSLY IN THE MORNING AND 70 IN THE EVENING DX E11.9 04/18/15  Yes Eulas Post, MD  LORazepam (ATIVAN) 0.5 MG tablet Take 1  tablet (0.5 mg total) by mouth every 6 (six) hours as needed for anxiety. Patient taking differently: Take 0.5 mg by mouth 2 (two) times daily as needed for anxiety.  06/28/14  Yes Eulas Post, MD  meclizine (ANTIVERT) 25 MG tablet Take 25 mg by mouth daily as needed for dizziness. Reported on 08/01/2015 01/28/14  Yes Historical Provider, MD  omeprazole (PRILOSEC) 40 MG capsule Take 40 mg by mouth 2 (two) times daily.   Yes Historical Provider, MD  oxiconazole (OXISTAT) 1 % CREA Apply 1 application topically as needed. For SKIN RASHES 08/01/15  Yes Marin Olp, MD  pazopanib (VOTRIENT) 200 MG tablet Take 800 mg by mouth daily. Midnight 01/31/13  Yes Historical Provider, MD  prochlorperazine (COMPAZINE) 5 MG tablet Take 5 mg by mouth every 6 (six) hours as needed for nausea.  01/10/16  Yes Historical Provider, MD  traMADol (ULTRAM) 50 MG tablet Take 1 tablet (50 mg total) by mouth every 6 (six) hours as needed. Patient taking differently: Take 50 mg by mouth every 6 (six) hours as needed for moderate pain.  05/21/14  Yes Eulas Post, MD  triamcinolone cream (KENALOG) 0.1 % Apply topically 2 (two) times daily. Compound with Eucerin 1:1 and apply to affected area bid prn itching Patient taking differently: Apply 1 application topically See admin instructions. Compound with Eucerin 1:1 and apply to affected area bid prn itching 04/03/12  Yes Eulas Post, MD  Alum & Mag Hydroxide-Simeth (MAGIC MOUTHWASH W/LIDOCAINE) SOLN Take 10 mLs by mouth 4 (four) times daily as needed for mouth pain. Patient not taking: Reported on 01/12/2016 10/08/14   Eulas Post, MD  azithromycin (ZITHROMAX) 250 MG tablet Take 2 tabs on day 1, then 1 tab daily until finished Patient not taking: Reported on 11/13/2015 08/01/15   Marin Olp, MD  becaplermin (REGRANEX) 0.01 % gel Apply 1 application topically daily. Patient not taking: Reported on 11/13/2015 01/25/14   Myeong O Sheard, DPM  benzonatate (TESSALON)  200 MG capsule Take 1 capsule (200 mg total) by mouth 2 (two) times daily as needed for cough. Patient not taking: Reported on 01/12/2016 10/10/14   Eulas Post, MD  carvedilol (COREG) 12.5 MG tablet Take 1 tablet (12.5 mg total) by mouth 2 (two) times daily with a meal. 05/21/14 05/21/15  Eulas Post, MD  glucose blood (TRUETRACK TEST) test strip USE ONE  STRIP TO CHECK GLUCOSE THREE TIMES DAILY       DX E11.9 04/18/15   Eulas Post, MD    Family History Family History  Problem Relation Age of Onset  . Colon cancer Mother 49  . Colon cancer Sister 46  . Colon cancer Brother 72  . Stomach cancer Neg Hx     Social History Social History  Substance Use Topics  . Smoking status: Former Smoker    Packs/day: 0.25    Years: 2.00    Types: Cigarettes    Quit date: 02/25/1960  . Smokeless tobacco: Never Used  . Alcohol use No     Allergies   Codeine; Hydrocodone; Oxycodone; and Sulfa antibiotics   Review of Systems Review of Systems  Constitutional: Positive for appetite change and fever.  Respiratory: Negative for cough and shortness of breath.   Cardiovascular: Negative for chest pain.  Gastrointestinal: Negative for abdominal pain, nausea and vomiting.  Genitourinary: Negative for dysuria.  Psychiatric/Behavioral: Positive for confusion.  All other systems reviewed and are negative.    Physical Exam Updated Vital Signs BP 126/66   Pulse 89   Temp 100.4 F (38 C) (Oral)   Resp 13   Ht 6\' 1"  (1.854 m)   Wt 201 lb (91.2 kg)   SpO2 96%   BMI 26.52 kg/m   Physical Exam  Constitutional: He is oriented to person, place, and time. No distress.  Elderly, no acute distress  HENT:  Head: Normocephalic and atraumatic.  Eyes: EOM are normal. Pupils are equal, round, and reactive to light.  Cardiovascular: Normal rate, regular rhythm and normal heart sounds.   No murmur heard. Pulmonary/Chest: Effort normal and breath sounds normal. No respiratory distress.  He has no wheezes.  Abdominal: Soft. Bowel sounds are normal. There is no tenderness. There is no rebound.  Musculoskeletal: He exhibits edema.  Left lower extremity wrapped with [place]  Neurological: He is alert and oriented to person, place, and time.  Cranial nerves II through XII intact, 5 out of 5 strength in all 4 extremities  Skin: Skin is warm and dry.  Tetracycline dry and intact, no significant erythema  Psychiatric: He has a normal mood and affect.  Nursing note and vitals reviewed.    ED Treatments / Results  DIAGNOSTIC STUDIES: Oxygen Saturation is 89% on RA, low by my interpretation.    COORDINATION OF CARE: 12:49 AM Discussed treatment plan with pt at bedside which includes lab work including a urinalysis and pt agreed to plan.  Labs (all labs ordered are listed, but only abnormal results are displayed) Labs Reviewed  COMPREHENSIVE METABOLIC PANEL - Abnormal; Notable for the following:       Result Value   Sodium 132 (*)    Chloride 99 (*)    Glucose, Bld 192 (*)    BUN 33 (*)    Creatinine, Ser 2.47 (*)    Calcium 8.6 (*)    Total Protein 5.9 (*)    Albumin 2.3 (*)    Alkaline Phosphatase 190 (*)    GFR calc non Af Amer 24 (*)    GFR calc Af Amer 28 (*)    All other components within normal limits  CBC WITH DIFFERENTIAL/PLATELET - Abnormal; Notable for the following:    RBC 3.17 (*)    Hemoglobin 9.1 (*)    HCT 28.1 (*)    Lymphs Abs 0.6 (*)    All other components within normal limits  URINALYSIS, ROUTINE W REFLEX  MICROSCOPIC (NOT AT [facility]) - Abnormal; Notable for the following:    Color, Urine AMBER (*)    APPearance CLOUDY (*)    Hgb urine dipstick LARGE (*)    Bilirubin Urine SMALL (*)    Protein, ur 30 (*)    Leukocytes, UA TRACE (*)    All other components within normal limits  URINE MICROSCOPIC-ADD ON - Abnormal; Notable for the following:    Squamous Epithelial / LPF 0-5 (*)    Bacteria, UA FEW (*)    Casts GRANULAR CAST (*)    All other  components within normal limits  CBG MONITORING, ED - Abnormal; Notable for the following:    Glucose-Capillary 181 (*)    All other components within normal limits  I-STAT CG4 LACTIC ACID, ED - Abnormal; Notable for the following:    Lactic Acid, Venous 2.73 (*)    All other components within normal limits  CULTURE, BLOOD (ROUTINE X 2)  CULTURE, BLOOD (ROUTINE X 2)  URINE CULTURE  I-STAT CG4 LACTIC ACID, ED    EKG  EKG Interpretation  Date/Time:  Monday January 12 2016 00:36:06 EDT Ventricular Rate:  87 PR Interval:    QRS Duration: 82 QT Interval:  335 QTC Calculation: 403 R Axis:   10 Text Interpretation:  Sinus rhythm Low voltage, extremity and precordial leads Confirmed by Delphin Funes  MD, Igor Bishop ([id number]) on 01/12/2016 2:10:54 AM       Radiology Dg Chest 2 View  Result Date: 01/12/2016 CLINICAL DATA:  Right-sided abdominal pain. Worsening confusion. Onset tonight. EXAM: CHEST  2 VIEW COMPARISON:  12/22/2011 FINDINGS: The lungs are clear. The pulmonary vasculature is normal. Heart size is normal. Hilar and mediastinal contours are unremarkable. There is no pleural effusion. IMPRESSION: No active cardiopulmonary disease. Electronically Signed   By: Andreas Newport M.D.   On: 01/12/2016 02:53   Ct Head Wo Contrast  Result Date: 01/12/2016 CLINICAL DATA:  Altered mental status EXAM: CT HEAD WITHOUT CONTRAST TECHNIQUE: Contiguous axial images were obtained from the base of the skull through the vertex without intravenous contrast. COMPARISON:  None. FINDINGS: Brain: There is no intracranial hemorrhage, mass or evidence of acute infarction. There is mild generalized atrophy. There is mild chronic microvascular ischemic change. There is no significant extra-axial fluid collection. No acute intracranial findings are evident. Vascular: No hyperdense vessel or unexpected calcification. Skull: Normal. Negative for fracture or focal lesion. Sinuses/Orbits: No acute finding. IMPRESSION: No  acute intracranial findings. There is mild generalized atrophy and chronic appearing white matter hypodensities which likely represent small vessel ischemic disease. Electronically Signed   By: Andreas Newport M.D.   On: 01/12/2016 04:47    Procedures Procedures (including critical care time)  Medications Ordered in ED Medications  sodium chloride 0.9 % bolus 1,000 mL (0 mLs Intravenous Stopped 01/12/16 0415)     Initial Impression / Assessment and Plan / ED Course  I have reviewed the triage vital signs and the nursing notes.  Pertinent labs & imaging results that were available during my care of the patient were reviewed by me and considered in my medical decision making (see chart for details).  Clinical Course    Patient presents with confusion.  Also reported generalized weakness and difficulty ambulating prior to arrival. Temperature 100.4. No other infectious symptoms. Recent embolism procedure. He is technically oriented but at times gives inappropriate answers. Lactate mildly elevated. Patient given fluids. No leukocytosis. No evidence of urinary tract infection, no pneumonia. Other lab work is at  baseline with the exception of alkaline phosphatase which may be related to metastatic cancer. CT head is negative. Patient is adamant that he is at his baseline. Wife is still concerned regarding his ability to ambulate on his own. Patient was able to ambulate without difficulty in the hallway and with minimal support. He likely has some deconditioning given his current health and recent procedure. I have encouraged close follow-up with PCP.  After history, exam, and medical workup I feel the patient has been appropriately medically screened and is safe for discharge home. Pertinent diagnoses were discussed with the patient. Patient was given return precautions.   Final Clinical Impressions(s) / ED Diagnoses   Final diagnoses:  Confusion  Physical deconditioning    New  Prescriptions New Prescriptions   No medications on file   I personally performed the services described in this documentation, which was scribed in my presence. The recorded information has been reviewed and is accurate.     Merryl Hacker, MD 01/12/16 0630

## 2016-01-12 NOTE — ED Triage Notes (Signed)
Per family pt has procedure at [facility] 2 days ago, (?partial embolization R kidney) per wife pt has been not wanting to eat or get out of the bed much since procedure. Today pt was gotten up by wife @2000  to eat dinner and was noted to be very confused, went to the bathroom and came back without underwear attempting to put boot on, fell into floor, pt unable to stand on her own. Per son pt is normally very appropriate and quick witted.

## 2016-01-12 NOTE — ED Notes (Signed)
Pt to radiaology via stretcher

## 2016-01-12 NOTE — Discharge Instructions (Signed)
You were seen today for confusion and weakness. Her workup is reassuring. There is no evidence of infection or other cause for these symptoms. Given your recent procedure and several days in bed, you may be deconditioned. It is important that you maintained physical activity safely. If you have any new or worsening symptoms you should be reevaluated.

## 2016-01-13 LAB — URINE CULTURE: Culture: NO GROWTH

## 2016-01-17 LAB — CULTURE, BLOOD (ROUTINE X 2)
CULTURE: NO GROWTH
Culture: NO GROWTH

## 2016-01-23 ENCOUNTER — Ambulatory Visit: Payer: Medicare Other | Admitting: Family Medicine

## 2016-02-02 ENCOUNTER — Other Ambulatory Visit: Payer: Self-pay | Admitting: Family Medicine

## 2016-02-03 ENCOUNTER — Telehealth: Payer: Self-pay

## 2016-02-03 NOTE — Telephone Encounter (Signed)
Received PA request from [employer] for Gabapentin 800mg . PA submitted & is pending. Key: [id number]

## 2016-02-09 NOTE — Telephone Encounter (Signed)
Pt is calling and would like [place] to file an appeal

## 2016-02-09 NOTE — Telephone Encounter (Signed)
PA was denied, no reason has been given yet.

## 2016-02-09 NOTE — Telephone Encounter (Signed)
Appeal filed. 

## 2016-02-13 ENCOUNTER — Encounter: Payer: Self-pay | Admitting: Family Medicine

## 2016-02-13 ENCOUNTER — Ambulatory Visit (INDEPENDENT_AMBULATORY_CARE_PROVIDER_SITE_OTHER): Payer: Medicare Other | Admitting: Family Medicine

## 2016-02-13 VITALS — BP 98/50 | HR 108 | Temp 97.8°F | Ht 73.0 in | Wt 196.7 lb

## 2016-02-13 DIAGNOSIS — G609 Hereditary and idiopathic neuropathy, unspecified: Secondary | ICD-10-CM | POA: Diagnosis not present

## 2016-02-13 DIAGNOSIS — R634 Abnormal weight loss: Secondary | ICD-10-CM | POA: Diagnosis not present

## 2016-02-13 DIAGNOSIS — Z23 Encounter for immunization: Secondary | ICD-10-CM | POA: Diagnosis not present

## 2016-02-13 DIAGNOSIS — E1142 Type 2 diabetes mellitus with diabetic polyneuropathy: Secondary | ICD-10-CM

## 2016-02-13 MED ORDER — GABAPENTIN 800 MG PO TABS: 800.0000 mg | ORAL_TABLET | Freq: Three times a day (TID) | ORAL | 3 refills | Status: DC

## 2016-02-13 MED ORDER — MIRTAZAPINE 15 MG PO TABS: 15.0000 mg | ORAL_TABLET | Freq: Every day | ORAL | 3 refills | Status: DC

## 2016-02-13 NOTE — Progress Notes (Signed)
Pre visit review using our clinic review tool, if applicable. No additional management support is needed unless otherwise documented below in the visit note. 

## 2016-02-13 NOTE — Telephone Encounter (Signed)
Left voicemail for [employer] to call back.

## 2016-02-13 NOTE — Progress Notes (Signed)
Subjective:     Patient ID: Frank Ashley, male   DOB: 1941/01/03, 76 y.o.   MRN: [id number]  HPI Patient has multiple chronic problems including metastatic renal cancer followed at [facility]. He has chronic kidney disease, type 2 diabetes, history of obesity, dyslipidemia, hypertension. For years he has taken very high-dose of gabapentin 800 mg 5 times daily but per nephrology at [facility] and they had suggested reducing this down to 3 times daily which he has been doing recently. We also had previously explained that gabapentin has very low bioavailability at higher doses. In any event, he is doing okay with 3 times daily.  He is currently being treated with Votrient for his renal cell carcinoma. He has decreased appetite and has lost substantial amount of weight this year. His oncologist explained if he did not gain 5 pounds they would be unable to continue with his current therapy.  Type 2 diabetes currently on Novolin 70/30 insulin 30 units the morning 35 units at night. No recent hypoglycemia. He apparently is getting hemoglobin A1c monitored at [facility].  He has had some mild depression symptoms.  Sleep is fair.   Past Medical History:  Diagnosis Date  . Acute pancreatitis 03/12/2010   Qualifier: Diagnosis of  By: Elease Hashimoto MD, Bruce    . Anxiety    SINCE MAY 2013 AND DIAGNOSIS OF CANCER-PT WAS HAVING PANIC ATTACKS-FELT LIKE HE COULDN'T BREATHE--STATES HE IS FEELING BETTER ON ALPRAZOLAM  . Arthritis HANDS  . BPH (benign prostatic hypertrophy)   . CAD (coronary artery disease)    Circumflex stent 1996  //  catheterization January, 2000, 50-60% mid LAD,  dominant circumflex-50% in-stent restenosis similar to a cath in 1999, RCA small and nondominant,, EF 60%  . Cancer [facility])    CANCER IN RIGHT KIDNEY-SURGERY PLANNED  . Choledocholithiasis with chronic cholecystitis    BILE DUCT STONES AND GALLSTONES  . Complication of anesthesia clostraphobic  . Diabetes mellitus type 2,  insulin dependent ([place])   . Diabetic foot ulcer ([place]) CURRENT RIGHT GREAT TOE WOUND W/ DRAINAGE --  DRESSING CHANGE EVERY OTHER DAY  . DIABETIC FOOT ULCER, TOE 08/01/2008   Qualifier: Diagnosis of  By: Elease Hashimoto MD, Bruce    . Ejection fraction    EF 60%, catheterization, 2000  //   EF 55-60%, echo, July, 2013  . Frequency of urination   . Generalized rash RIGHT SHOULDER/ BACK AREA  . GERD (gastroesophageal reflux disease)   . Hematuria    YRS AGO  . History of acute pancreatitis 2008  . History of gout STABLE PER PT  . History of kidney stones AGE 89  . History of MRSA infection 10 YRS AGO /  SUPERFICIAL SKIN AREA  . History of peptic ulcer disease   . Hyperlipidemia   . Hypertension   . Left leg numbness COMPLETE NUMBNESS FROM HIP DOWN TO FOOT  . Nocturia   . Numbness in right leg FROM KNEE DOWN TO FOOT  . OSA (obstructive sleep apnea) CPAP USED UP UNTIL 2 YRS AGO  WHEN MASK WAS DAMAGED   . Peripheral neuropathy ([place])   . Preop cardiovascular exam    Cardiac clearance for nephrectomy and cholecystectomy June, 2013  . Renal insufficiency   . Renal mass, right    Past Surgical History:  Procedure Laterality Date  . BILROTH II PROCEDURE  AGE 57   2 MONTHS LATER,  BILROTH II RECONSTRUCTION  . CARDIAC CATHETERIZATION  05-04-1998--- REPORT W/ CHART   50%  IN-STENT RESTENOSIS OF CIRCUMFLEX/ 20% LEFT MAIN/ 50-60% LAD/ RCA NORMAL/ LV NORMAL / EF 60%  . CATARACT EXTRACTION W/ INTRAOCULAR LENS  IMPLANT, BILATERAL  2005  . CHOLECYSTECTOMY  12/20/2011   Procedure: CHOLECYSTECTOMY WITH COMMON DUCT EXPLORATION;  Surgeon: Marcello Moores A. Cornett, MD;  Location: [facility];  Service: General;  Laterality: N/A;  choloductoscopy  . CORONARY ANGIOPLASTY WITH STENT PLACEMENT  1996   STENTING X1 CIRCUMFLEX  . CT PERC CHOLECYSTOSTOMY  09-17-2011  . CYSTOSCOPY/RETROGRADE/URETEROSCOPY  10/26/2011   Procedure: CYSTOSCOPY/RETROGRADE/URETEROSCOPY;  Surgeon: Fredricka Bonine, MD;  Location: [facility];  Service: Urology;  Laterality: N/A;  RIGHT URETEROSCOPY AND RIGHT RETROGRADE PYELOGRAM  DIABETIC C ARM   . ESOPHAGOGASTRODUODENOSCOPY  09/16/2011   Procedure: ESOPHAGOGASTRODUODENOSCOPY (EGD);  Surgeon: Missy Sabins, MD;  Location: [facility];  Service: Endoscopy;  Laterality: N/A;  . INTRAOPERATIVE CHOLANGIOGRAM  12/20/2011   Procedure: INTRAOPERATIVE CHOLANGIOGRAM;  Surgeon: Joyice Faster. Cornett, MD;  Location: [facility];  Service: General;;  . INTRAOPERATIVE CHOLANGIOGRAM  12/24/2011   Procedure: INTRAOPERATIVE CHOLANGIOGRAM;  Surgeon: Joyice Faster. Cornett, MD;  Location: [facility];  Service: General;  Laterality: N/A;  . LAPAROTOMY  12/24/2011   Procedure: EXPLORATORY LAPAROTOMY;  Surgeon: Joyice Faster. Cornett, MD;  Location: [facility];  Service: General;  Laterality: N/A;  Replacement of T-tube and Bile duct exploration  . PARTIAL NEPHRECTOMY  12/20/2011   Procedure: NEPHRECTOMY PARTIAL;  Surgeon: Fredricka Bonine, MD;  Location: [facility];  Service: Urology;  Laterality: Right;  Right Partial nephrectomy  . PENILE PROSTHESIS IMPLANT  2008   REMOVED 2010 DUE TO MALFUNCTION  . REMOVAL OF PENILE PROSTHESIS    . RIGHT GREAT TOE SURG.  2000  . SURG FOR REPAIR OF PERFERATED PEPTIC ULCER  AGE 78    reports that he quit smoking about 56 years ago. His smoking use included Cigarettes. He has a 0.50 pack-year smoking history. He has never used smokeless tobacco. He reports that he does not drink alcohol or use drugs. family history includes Colon cancer (age of onset: 12) in his sister; Colon cancer (age of onset: 30) in his mother; Colon cancer (age of onset: 65) in his brother. Allergies  Allergen Reactions  . Codeine Shortness Of Breath    SOB, NERVOUSNESS  . Hydrocodone Shortness Of Breath  . Oxycodone Shortness Of Breath    SOB, NERVOUSNESS  . Sulfa Antibiotics Rash     Review of Systems  Constitutional: Positive for appetite change, fatigue and unexpected weight change. Negative  for chills and fever.  Eyes: Negative for visual disturbance.  Respiratory: Negative for cough, chest tightness and shortness of breath.   Cardiovascular: Negative for chest pain, palpitations and leg swelling.  Endocrine: Negative for polydipsia and polyuria.  Genitourinary: Negative for dysuria.  Neurological: Negative for dizziness, syncope, weakness, light-headedness and headaches.  Hematological: Negative for adenopathy.  Psychiatric/Behavioral: Positive for dysphoric mood.       Objective:   Physical Exam  Constitutional: He appears well-developed and well-nourished.  Cardiovascular: Normal rate and regular rhythm.   Pulmonary/Chest: Effort normal and breath sounds normal. No respiratory distress. He has no wheezes. He has no rales.  Musculoskeletal: He exhibits no edema.  Neurological: He is alert.  Psychiatric: He has a normal mood and affect. His behavior is normal.       Assessment:     #1 chronic diabetic peripheral neuropathy  #2 type 2 diabetes  #3 metastatic renal cell carcinoma  #4 recent weight loss and decline  in appetite    Plan:     -Renewed prescription for gabapentin 800 mg 3 times a day. We explained that we would like to try eventually taper back his dose further. -Flu vaccine given -Trial of Remeron 15 mg daily at bedtime to see if this can stimulate his appetite and help with weight gain. Reassess in one month. Consider trial of Megace if no improvement at that point  Eulas Post MD [facility]

## 2016-02-16 ENCOUNTER — Encounter: Payer: Self-pay | Admitting: Family Medicine

## 2016-02-16 NOTE — Telephone Encounter (Signed)
FYI: can we send in an alternate?

## 2016-02-16 NOTE — Telephone Encounter (Signed)
No.  We cannot use any alternatives.  He has reduced dose to 800 mg po TID (I think the PA was for five times daily). He does not have seizure or postherpetic neuralgia hx.

## 2016-02-16 NOTE — Telephone Encounter (Signed)
Left another voicemail for [employer].

## 2016-02-16 NOTE — Telephone Encounter (Signed)
Neuropathy is not an FDA approved reason for the quantity limit to be approved. The patient has to have a history of seizures or post neuralgia.

## 2016-02-17 MED ORDER — GABAPENTIN 600 MG PO TABS: 600.0000 mg | ORAL_TABLET | Freq: Three times a day (TID) | ORAL | Status: DC

## 2016-02-17 NOTE — Addendum Note (Signed)
Addended by: Elio Forget on: 02/17/2016 04:18 PM   Modules accepted: Orders

## 2016-02-17 NOTE — Telephone Encounter (Signed)
If they will approve for 600 mg po TID that would be an option.  Otherwise, pt will have to decide whether to pay out of pocket.  Let him know we have done everything possible to try and get approved for him.

## 2016-02-17 NOTE — Telephone Encounter (Signed)
PA denied for five times per day. Medication sent in for TID, they faxed [place] PA, and its denied too.

## 2016-02-17 NOTE — Telephone Encounter (Signed)
I have faxed new dose to pharmacy.

## 2016-02-18 ENCOUNTER — Encounter: Payer: Self-pay | Admitting: Gastroenterology

## 2016-02-18 MED ORDER — GABAPENTIN 800 MG PO TABS: 800.0000 mg | ORAL_TABLET | Freq: Three times a day (TID) | ORAL | 0 refills | Status: AC

## 2016-02-18 NOTE — Telephone Encounter (Signed)
Per pt pharmacy filed 800mg  tid. I have cancelled the 600mg  and up dated medication list.

## 2016-02-18 NOTE — Addendum Note (Signed)
Addended by: Elio Forget on: 02/18/2016 11:00 AM   Modules accepted: Orders

## 2016-03-09 ENCOUNTER — Other Ambulatory Visit: Payer: Self-pay

## 2016-03-09 MED ORDER — OMEPRAZOLE 40 MG PO CPDR: 40.0000 mg | DELAYED_RELEASE_CAPSULE | Freq: Two times a day (BID) | ORAL | 3 refills | Status: AC

## 2016-03-10 ENCOUNTER — Inpatient Hospital Stay (HOSPITAL_COMMUNITY)
Admission: EM | Admit: 2016-03-10 | Discharge: 2016-03-15 | DRG: 562 | Disposition: A | Discharge status: Skilled Nursing Facility | Payer: Medicare Other | Attending: Internal Medicine | Admitting: Internal Medicine

## 2016-03-10 ENCOUNTER — Encounter (HOSPITAL_COMMUNITY): Payer: Self-pay | Admitting: Emergency Medicine

## 2016-03-10 ENCOUNTER — Ambulatory Visit: Payer: Medicare Other | Admitting: Family Medicine

## 2016-03-10 DIAGNOSIS — G609 Hereditary and idiopathic neuropathy, unspecified: Secondary | ICD-10-CM | POA: Diagnosis present

## 2016-03-10 DIAGNOSIS — C649 Malignant neoplasm of unspecified kidney, except renal pelvis: Secondary | ICD-10-CM | POA: Diagnosis present

## 2016-03-10 DIAGNOSIS — W010XXA Fall on same level from slipping, tripping and stumbling without subsequent striking against object, initial encounter: Secondary | ICD-10-CM | POA: Diagnosis present

## 2016-03-10 DIAGNOSIS — I10 Essential (primary) hypertension: Secondary | ICD-10-CM | POA: Diagnosis present

## 2016-03-10 DIAGNOSIS — N400 Enlarged prostate without lower urinary tract symptoms: Secondary | ICD-10-CM | POA: Diagnosis present

## 2016-03-10 DIAGNOSIS — S42212A Unspecified displaced fracture of surgical neck of left humerus, initial encounter for closed fracture: Secondary | ICD-10-CM | POA: Diagnosis not present

## 2016-03-10 DIAGNOSIS — E875 Hyperkalemia: Secondary | ICD-10-CM | POA: Diagnosis present

## 2016-03-10 DIAGNOSIS — G629 Polyneuropathy, unspecified: Secondary | ICD-10-CM | POA: Diagnosis present

## 2016-03-10 DIAGNOSIS — S42202A Unspecified fracture of upper end of left humerus, initial encounter for closed fracture: Secondary | ICD-10-CM

## 2016-03-10 DIAGNOSIS — E785 Hyperlipidemia, unspecified: Secondary | ICD-10-CM | POA: Diagnosis present

## 2016-03-10 DIAGNOSIS — Z882 Allergy status to sulfonamides status: Secondary | ICD-10-CM

## 2016-03-10 DIAGNOSIS — I2510 Atherosclerotic heart disease of native coronary artery without angina pectoris: Secondary | ICD-10-CM | POA: Diagnosis present

## 2016-03-10 DIAGNOSIS — E872 Acidosis, unspecified: Secondary | ICD-10-CM

## 2016-03-10 DIAGNOSIS — Z79891 Long term (current) use of opiate analgesic: Secondary | ICD-10-CM

## 2016-03-10 DIAGNOSIS — Z87891 Personal history of nicotine dependence: Secondary | ICD-10-CM

## 2016-03-10 DIAGNOSIS — G4733 Obstructive sleep apnea (adult) (pediatric): Secondary | ICD-10-CM | POA: Diagnosis present

## 2016-03-10 DIAGNOSIS — K219 Gastro-esophageal reflux disease without esophagitis: Secondary | ICD-10-CM | POA: Diagnosis present

## 2016-03-10 DIAGNOSIS — D649 Anemia, unspecified: Secondary | ICD-10-CM | POA: Diagnosis present

## 2016-03-10 DIAGNOSIS — K279 Peptic ulcer, site unspecified, unspecified as acute or chronic, without hemorrhage or perforation: Secondary | ICD-10-CM | POA: Diagnosis present

## 2016-03-10 DIAGNOSIS — Z794 Long term (current) use of insulin: Secondary | ICD-10-CM

## 2016-03-10 DIAGNOSIS — L8990 Pressure ulcer of unspecified site, unspecified stage: Secondary | ICD-10-CM | POA: Insufficient documentation

## 2016-03-10 DIAGNOSIS — Z9119 Patient's noncompliance with other medical treatment and regimen: Secondary | ICD-10-CM

## 2016-03-10 DIAGNOSIS — E1142 Type 2 diabetes mellitus with diabetic polyneuropathy: Secondary | ICD-10-CM | POA: Diagnosis present

## 2016-03-10 DIAGNOSIS — G4730 Sleep apnea, unspecified: Secondary | ICD-10-CM | POA: Diagnosis present

## 2016-03-10 DIAGNOSIS — M19041 Primary osteoarthritis, right hand: Secondary | ICD-10-CM | POA: Diagnosis present

## 2016-03-10 DIAGNOSIS — M19042 Primary osteoarthritis, left hand: Secondary | ICD-10-CM | POA: Diagnosis present

## 2016-03-10 DIAGNOSIS — Z885 Allergy status to narcotic agent status: Secondary | ICD-10-CM

## 2016-03-10 DIAGNOSIS — S42209A Unspecified fracture of upper end of unspecified humerus, initial encounter for closed fracture: Secondary | ICD-10-CM | POA: Diagnosis present

## 2016-03-10 DIAGNOSIS — Z955 Presence of coronary angioplasty implant and graft: Secondary | ICD-10-CM

## 2016-03-10 DIAGNOSIS — E1122 Type 2 diabetes mellitus with diabetic chronic kidney disease: Secondary | ICD-10-CM | POA: Diagnosis present

## 2016-03-10 DIAGNOSIS — Z800 Family history of malignant neoplasm of digestive organs: Secondary | ICD-10-CM

## 2016-03-10 DIAGNOSIS — Z8711 Personal history of peptic ulcer disease: Secondary | ICD-10-CM

## 2016-03-10 DIAGNOSIS — I129 Hypertensive chronic kidney disease with stage 1 through stage 4 chronic kidney disease, or unspecified chronic kidney disease: Secondary | ICD-10-CM | POA: Diagnosis present

## 2016-03-10 DIAGNOSIS — N184 Chronic kidney disease, stage 4 (severe): Secondary | ICD-10-CM | POA: Diagnosis present

## 2016-03-10 DIAGNOSIS — F410 Panic disorder [episodic paroxysmal anxiety] without agoraphobia: Secondary | ICD-10-CM | POA: Diagnosis present

## 2016-03-10 DIAGNOSIS — Z8614 Personal history of Methicillin resistant Staphylococcus aureus infection: Secondary | ICD-10-CM

## 2016-03-10 DIAGNOSIS — E860 Dehydration: Secondary | ICD-10-CM | POA: Diagnosis present

## 2016-03-10 DIAGNOSIS — E43 Unspecified severe protein-calorie malnutrition: Secondary | ICD-10-CM | POA: Insufficient documentation

## 2016-03-10 DIAGNOSIS — E11621 Type 2 diabetes mellitus with foot ulcer: Secondary | ICD-10-CM | POA: Diagnosis present

## 2016-03-10 DIAGNOSIS — Z789 Other specified health status: Secondary | ICD-10-CM

## 2016-03-10 DIAGNOSIS — L97529 Non-pressure chronic ulcer of other part of left foot with unspecified severity: Secondary | ICD-10-CM | POA: Diagnosis present

## 2016-03-10 DIAGNOSIS — Z7982 Long term (current) use of aspirin: Secondary | ICD-10-CM

## 2016-03-10 DIAGNOSIS — N179 Acute kidney failure, unspecified: Secondary | ICD-10-CM | POA: Diagnosis present

## 2016-03-10 DIAGNOSIS — Z79899 Other long term (current) drug therapy: Secondary | ICD-10-CM

## 2016-03-10 DIAGNOSIS — S42309A Unspecified fracture of shaft of humerus, unspecified arm, initial encounter for closed fracture: Secondary | ICD-10-CM | POA: Diagnosis present

## 2016-03-10 MED ORDER — OXYCODONE-ACETAMINOPHEN 5-325 MG PO TABS
1.0000 | ORAL_TABLET | ORAL | Status: DC | PRN
  Administered 2016-03-10: 1 via ORAL
  Filled 2016-03-10 (×2): qty 1

## 2016-03-10 NOTE — ED Provider Notes (Signed)
[facility] DEPT Provider Note   CSN: [id number] Arrival date & time: 03/10/16  2332  By signing my name below, I, Julien Nordmann, attest that this documentation has been prepared under the direction and in the presence of [employer], PA-C.  Electronically Signed: Julien Nordmann, ED Scribe. 03/11/16. 12:04 AM.   History   Chief Complaint Chief Complaint  Patient presents with  . Arm Pain  . Fall    The history is provided by the patient. No language interpreter was used.   HPI Comments: DARIS MURACH is a 75 y.o. male brought in by ambulance, who has a PMhx of CAD, DM, and HLD presents to the Emergency Department complaining of moderate, gradual worsening, left shoulder pain s/p a mechanical fall that occurred earlier this evening. He notes the pain radiates into his left arm and wrist.There is a deformity noted. Pt states that he was at home when he tripped, fell over some cords, and hit his left shoulder. He did not hit his head or lose consciousness. Pt is a metastatic renal cell cancer patient where he notes getting his last treatment today and is undergoing new chemotherapy next Wednesday. He was unable to receive any pain medication via EMS. Denies numbness, tingling, and weakness.    PCP: Eulas Post, MD  Past Medical History:  Diagnosis Date  . Acute pancreatitis 03/12/2010   Qualifier: Diagnosis of  By: Elease Hashimoto MD, Bruce    . Anxiety    SINCE MAY 2013 AND DIAGNOSIS OF CANCER-PT WAS HAVING PANIC ATTACKS-FELT LIKE HE COULDN'T BREATHE--STATES HE IS FEELING BETTER ON ALPRAZOLAM  . Arthritis HANDS  . BPH (benign prostatic hypertrophy)   . CAD (coronary artery disease)    Circumflex stent 1996  //  catheterization January, 2000, 50-60% mid LAD,  dominant circumflex-50% in-stent restenosis similar to a cath in 1999, RCA small and nondominant,, EF 60%  . Cancer [facility])    CANCER IN RIGHT KIDNEY-SURGERY PLANNED  . Choledocholithiasis with chronic cholecystitis    BILE DUCT STONES AND GALLSTONES  . Complication of anesthesia clostraphobic  . Diabetes mellitus type 2, insulin dependent ([place])   . Diabetic foot ulcer ([place]) CURRENT RIGHT GREAT TOE WOUND W/ DRAINAGE --  DRESSING CHANGE EVERY OTHER DAY  . DIABETIC FOOT ULCER, TOE 08/01/2008   Qualifier: Diagnosis of  By: Elease Hashimoto MD, Bruce    . Ejection fraction    EF 60%, catheterization, 2000  //   EF 55-60%, echo, July, 2013  . Frequency of urination   . Generalized rash RIGHT SHOULDER/ BACK AREA  . GERD (gastroesophageal reflux disease)   . Hematuria    YRS AGO  . History of acute pancreatitis 2008  . History of gout STABLE PER PT  . History of kidney stones AGE 13  . History of MRSA infection 10 YRS AGO /  SUPERFICIAL SKIN AREA  . History of peptic ulcer disease   . Hyperlipidemia   . Hypertension   . Left leg numbness COMPLETE NUMBNESS FROM HIP DOWN TO FOOT  . Nocturia   . Numbness in right leg FROM KNEE DOWN TO FOOT  . OSA (obstructive sleep apnea) CPAP USED UP UNTIL 2 YRS AGO  WHEN MASK WAS DAMAGED   . Peripheral neuropathy ([place])   . Preop cardiovascular exam    Cardiac clearance for nephrectomy and cholecystectomy June, 2013  . Renal insufficiency   . Renal mass, right     Patient Active Problem List   Diagnosis Date Noted  . Acquired hammer  toe of right foot 03/11/2014  . Ulcer of foot, chronic ([place]) 02/18/2014  . 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 Metastatic renal cell carcinoma ([place]) 11/27/2013  . Obesity (BMI 30-39.9) 05/15/2013  . Anxiety 01/21/2012  . Biliary drain displacement - T-tube replaced 01/01/2012  . Ejection fraction   . CAD (coronary artery disease)   . Hypertension   . Type 2 diabetes, controlled, with peripheral neuropathy ([place])   . Hyperlipidemia   . Right renal mass 09/24/2011  . Bacteremia due to Escherichia coli 09/18/2011  . Ascending cholangitis 09/14/2011  . 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 Overweight(278.02) 10/04/2008  . SLEEP APNEA  10/04/2008  . Ulcer of heel and midfoot ([place]) 08/01/2008  . HYPERLIPIDEMIA 07/24/2008  . Hereditary and idiopathic peripheral neuropathy 07/24/2008  . Allergic rhinitis, cause unspecified 07/24/2008  . GERD 07/24/2008  . Chronic kidney disease, unspecified 07/24/2008    Past Surgical History:  Procedure Laterality Date  . BILROTH II PROCEDURE  AGE 34   2 MONTHS LATER,  BILROTH II RECONSTRUCTION  . CARDIAC CATHETERIZATION  05-04-1998--- REPORT W/ CHART   50%  IN-STENT RESTENOSIS OF CIRCUMFLEX/ 20% LEFT MAIN/ 50-60% LAD/ RCA NORMAL/ LV NORMAL / EF 60%  . CATARACT EXTRACTION W/ INTRAOCULAR LENS  IMPLANT, BILATERAL  2005  . CHOLECYSTECTOMY  12/20/2011   Procedure: CHOLECYSTECTOMY WITH COMMON DUCT EXPLORATION;  Surgeon: Marcello Moores A. Cornett, MD;  Location: [facility];  Service: General;  Laterality: N/A;  choloductoscopy  . CORONARY ANGIOPLASTY WITH STENT PLACEMENT  1996   STENTING X1 CIRCUMFLEX  . CT PERC CHOLECYSTOSTOMY  09-17-2011  . CYSTOSCOPY/RETROGRADE/URETEROSCOPY  10/26/2011   Procedure: CYSTOSCOPY/RETROGRADE/URETEROSCOPY;  Surgeon: Fredricka Bonine, MD;  Location: [facility];  Service: Urology;  Laterality: N/A;  RIGHT URETEROSCOPY AND RIGHT RETROGRADE PYELOGRAM  DIABETIC C ARM   . ESOPHAGOGASTRODUODENOSCOPY  09/16/2011   Procedure: ESOPHAGOGASTRODUODENOSCOPY (EGD);  Surgeon: Missy Sabins, MD;  Location: [facility];  Service: Endoscopy;  Laterality: N/A;  . INTRAOPERATIVE CHOLANGIOGRAM  12/20/2011   Procedure: INTRAOPERATIVE CHOLANGIOGRAM;  Surgeon: Joyice Faster. Cornett, MD;  Location: [facility];  Service: General;;  . INTRAOPERATIVE CHOLANGIOGRAM  12/24/2011   Procedure: INTRAOPERATIVE CHOLANGIOGRAM;  Surgeon: Joyice Faster. Cornett, MD;  Location: [facility];  Service: General;  Laterality: N/A;  . LAPAROTOMY  12/24/2011   Procedure: EXPLORATORY LAPAROTOMY;  Surgeon: Joyice Faster. Cornett, MD;  Location: [facility];  Service: General;  Laterality: N/A;  Replacement of T-tube and Bile duct  exploration  . PARTIAL NEPHRECTOMY  12/20/2011   Procedure: NEPHRECTOMY PARTIAL;  Surgeon: Fredricka Bonine, MD;  Location: [facility];  Service: Urology;  Laterality: Right;  Right Partial nephrectomy  . PENILE PROSTHESIS IMPLANT  2008   REMOVED 2010 DUE TO MALFUNCTION  . REMOVAL OF PENILE PROSTHESIS    . RIGHT GREAT TOE SURG.  2000  . SURG FOR REPAIR OF PERFERATED PEPTIC ULCER  AGE 50       Home Medications    Prior to Admission medications   Medication Sig Start Date End Date Taking? Authorizing Provider  amLODipine (NORVASC) 10 MG tablet Take 10 mg by mouth daily.  04/10/13   Historical Provider, MD  amoxicillin (AMOXIL) 500 MG capsule Take 2,000 mg by mouth See admin instructions. Prior to dental appointment 11/12/15   Historical Provider, MD  aspirin 325 MG EC tablet Take 325 mg by mouth. Takes 3 times per week.    Historical Provider, MD  atorvastatin (LIPITOR) 20 MG tablet Take 20 mg by mouth  daily.    Historical Provider, MD  becaplermin (REGRANEX) 0.01 % gel Apply 1 application topically daily. Patient not taking: Reported on 02/13/2016 01/25/14   Myeong O Sheard, DPM  benzonatate (TESSALON) 200 MG capsule Take 1 capsule (200 mg total) by mouth 2 (two) times daily as needed for cough. Patient not taking: Reported on 02/13/2016 10/10/14   Eulas Post, MD  carvedilol (COREG) 12.5 MG tablet Take 12.5 mg by mouth 2 (two) times daily. 09/10/15   Historical Provider, MD  gabapentin (NEURONTIN) 800 MG tablet Take 1 tablet (800 mg total) by mouth 3 (three) times daily. 02/18/16   Eulas Post, MD  glucose blood (TRUETRACK TEST) test strip USE ONE STRIP TO CHECK GLUCOSE THREE TIMES DAILY       DX E11.9 04/18/15   Eulas Post, MD  HYDROmorphone (DILAUDID) 2 MG tablet Take 2 mg by mouth every 4 (four) hours as needed for severe pain.  01/10/16   Historical Provider, MD  insulin NPH-regular Human (NOVOLIN 70/30 RELION) (70-30) 100 UNIT/ML injection INJECT 80 UNITS SUBCUTANEOUSLY  IN THE MORNING AND 70 IN THE EVENING DX E11.9 04/18/15   Eulas Post, MD  LORazepam (ATIVAN) 0.5 MG tablet Take 1 tablet (0.5 mg total) by mouth every 6 (six) hours as needed for anxiety. Patient taking differently: Take 0.5 mg by mouth 2 (two) times daily as needed for anxiety.  06/28/14   Eulas Post, MD  meclizine (ANTIVERT) 25 MG tablet Take 25 mg by mouth daily as needed for dizziness. Reported on 08/01/2015 01/28/14   Historical Provider, MD  mirtazapine (REMERON) 15 MG tablet Take 1 tablet (15 mg total) by mouth at bedtime. 02/13/16   Eulas Post, MD  omeprazole (PRILOSEC) 40 MG capsule Take 1 capsule (40 mg total) by mouth 2 (two) times daily. 03/09/16   Eulas Post, MD  oxiconazole (OXISTAT) 1 % CREA Apply 1 application topically as needed. For SKIN RASHES Patient not taking: Reported on 02/13/2016 08/01/15   Marin Olp, MD  oxyCODONE-acetaminophen (PERCOCET/ROXICET) 5-325 MG tablet Take 1-2 tablets by mouth every 6 (six) hours as needed for severe pain. 03/11/16   Antonietta Breach, PA-C  pazopanib (VOTRIENT) 200 MG tablet Take 800 mg by mouth daily. Midnight 01/31/13   Historical Provider, MD  triamcinolone cream (KENALOG) 0.1 % Apply topically 2 (two) times daily. Compound with Eucerin 1:1 and apply to affected area bid prn itching Patient not taking: Reported on 02/13/2016 04/03/12   Eulas Post, MD    Family History Family History  Problem Relation Age of Onset  . Colon cancer Mother 71  . Colon cancer Sister 93  . Colon cancer Brother 23  . Stomach cancer Neg Hx     Social History Social History  Substance Use Topics  . Smoking status: Former Smoker    Packs/day: 0.25    Years: 2.00    Types: Cigarettes    Quit date: 02/25/1960  . Smokeless tobacco: Never Used  . Alcohol use No     Allergies   Codeine; Hydrocodone; Oxycodone; and Sulfa antibiotics   Review of Systems Review of Systems  Musculoskeletal: Positive for arthralgias.    Neurological: Negative for syncope, weakness and numbness.  A complete 10 system review of systems was obtained and all systems are negative except as noted in the HPI and PMH.     Physical Exam Updated Vital Signs BP 148/71 (BP Location: Right Arm)   Pulse 86   Temp  99.1 F (37.3 C) (Oral)   Resp (!) 7   Ht 6' (1.829 m)   Wt 85.7 kg   SpO2 100%   BMI 25.63 kg/m   Physical Exam  Constitutional: He is oriented to person, place, and time. He appears well-developed and well-nourished. No distress.  Nontoxic appearing; uncomfortable.  HENT:  Head: Normocephalic and atraumatic.  Eyes: Conjunctivae and EOM are normal. No scleral icterus.  Neck: Normal range of motion.  Cardiovascular: Normal rate, regular rhythm and intact distal pulses.   Distal radial pulse 2+ in the LUE  Pulmonary/Chest: Effort normal. No respiratory distress.  Respirations even and unlabored  Musculoskeletal:       Left shoulder: He exhibits decreased range of motion, tenderness, bony tenderness, deformity and pain. He exhibits no swelling, normal pulse and normal strength.  Neurological: He is alert and oriented to person, place, and time. He exhibits normal muscle tone. Coordination normal.  Sensation to light touch intact in the LUE. Grip strength 5/5 in the L hand.  Skin: Skin is warm and dry. No rash noted. He is not diaphoretic. No erythema. No pallor.  Psychiatric: He has a normal mood and affect. His behavior is normal.  Nursing note and vitals reviewed.    ED Treatments / Results  DIAGNOSTIC STUDIES: Oxygen Saturation is 100% on RA, normal by my interpretation.  COORDINATION OF CARE:  12:01 AM Discussed treatment plan with pt at bedside and pt agreed to plan.  Labs (all labs ordered are listed, but only abnormal results are displayed) Labs Reviewed  CBC WITH DIFFERENTIAL/PLATELET - Abnormal; Notable for the following:       Result Value   RBC 3.79 (*)    Hemoglobin 9.3 (*)    HCT 29.7 (*)     MCH 24.5 (*)    RDW 17.1 (*)    Platelets 435 (*)    All other components within normal limits  BASIC METABOLIC PANEL - Abnormal; Notable for the following:    Potassium 5.6 (*)    Chloride 114 (*)    CO2 13 (*)    Glucose, Bld 238 (*)    BUN 22 (*)    Creatinine, Ser 1.51 (*)    Calcium 5.4 (*)    GFR calc non Af Amer 44 (*)    GFR calc Af Amer 51 (*)    All other components within normal limits    EKG  EKG Interpretation  Date/Time:  Thursday March 11 2016 03:32:14 EST Ventricular Rate:  83 PR Interval:    QRS Duration: 88 QT Interval:  385 QTC Calculation: 453 R Axis:   12 Text Interpretation:  Sinus rhythm Low voltage, precordial leads RSR' in V1 or V2, probably normal variant No significant change since last tracing Confirmed by [place] ([id number]) on 03/11/2016 3:43:03 AM       Radiology Dg Shoulder Left  Result Date: 03/11/2016 CLINICAL DATA:  Trip and fall injury.  Left shoulder deformity. EXAM: LEFT SHOULDER - 2+ VIEW COMPARISON:  None. FINDINGS: Comminuted fractures of the left proximal humerus with transverse impacted fracture across the left humeral surgical neck and longitudinal fractures extending over the base of the greater trochanter. Mild lateral angulation of the distal fracture fragments. Glenohumeral joint appears grossly intact although visualization is limited due to patient positioning. IMPRESSION: Comminuted fractures of the proximal left humerus. Electronically Signed   By: Lucienne Capers M.D.   On: 03/11/2016 00:41    Procedures Procedures (including critical care time)  Medications Ordered in ED Medications  oxyCODONE-acetaminophen (PERCOCET/ROXICET) 5-325 MG per tablet 1 tablet (1 tablet Oral Given 03/10/16 2353)  morphine 4 MG/ML injection 4 mg (4 mg Intramuscular Given 03/11/16 0011)  fentaNYL (SUBLIMAZE) injection 50 mcg (50 mcg Nasal Given 03/11/16 0109)  oxyCODONE-acetaminophen (PERCOCET/ROXICET) 5-325 MG per tablet 2 tablet  (2 tablets Oral Given 03/11/16 0333)     Initial Impression / Assessment and Plan / ED Course  I have reviewed the triage vital signs and the nursing notes.  Pertinent labs & imaging results that were available during my care of the patient were reviewed by me and considered in my medical decision making (see chart for details).  Clinical Course     75 year old male with a history of metastatic renal cell carcinoma undergoing chemotherapy and followed by [facility], presents to the emergency department after a witnessed fall for complaint of left shoulder pain. Patient with deformity of his left shoulder consistent with evidence of humeral neck fracture on x-ray. Patient is neurovascularly intact. Shoulder was immobilized in the emergency department. Patient denies history of head trauma or loss of consciousness from his fall today.  Patient initially given Percocet on arrival. He continued to complain of pain at which time he was given a dose of morphine as well as fentanyl. This did not control the patient's pain adequately; however, he initially opted for outpatient management. Patient later reported that he did not feel comfortable with discharge as he did not feel that his pain would be adequately managed at home with Percocet. Baseline laboratory tests were drawn which revealed significant hypocalcemia with mild hyperkalemia and a normal anion gap acidosis. Metabolic panel is consistent with test results from [facility] 24 hours ago. Suspect results to be secondary current chemotherapy regimen. Case discussed with Dr. Hal Hope for admission for pain control. Dr. Hal Hope requested consult to orthopedics given that cause of pain was due to an orthopedic injury. Dr. Ninfa Linden made aware. Will admit to Tele under observation.  I personally performed the services described in this documentation, which was scribed in my presence. The recorded information has been reviewed and is accurate.     Final Clinical Impressions(s) / ED Diagnoses   Final diagnoses:  Closed fracture of proximal end of left humerus, unspecified fracture morphology, initial encounter  Hypocalcemia  Metabolic acidosis    New Prescriptions New Prescriptions   OXYCODONE-ACETAMINOPHEN (PERCOCET/ROXICET) 5-325 MG TABLET    Take 1-2 tablets by mouth every 6 (six) hours as needed for severe pain.     Antonietta Breach, PA-C 03/11/16 0507    Ripley Fraise, MD 03/11/16 [phone]

## 2016-03-10 NOTE — ED Triage Notes (Signed)
Pt comes from home via EMS with complaints of a fall where he tripped over some cords and fell and hit his left shoulder.  Per EMS, deformity noted.  Patient unable to complete ROM with that arm.  Pulses intact. A&O.  Denies LOC.  Abrasion noted to right elbow and hand.  Pt has hx of metastatic cancer.  Pt states it has spread to his liver, pancreas, lungs, etc.  Received chemo earlier today.  Dr. Elease Hashimoto is family doctor. BP 129/42, HR 80 in route.

## 2016-03-10 NOTE — ED Notes (Signed)
Bed: WA03 Expected date:  Expected time:  Means of arrival:  Comments: EMS 75 yo male fall/left shoulder pain

## 2016-03-11 ENCOUNTER — Emergency Department (HOSPITAL_COMMUNITY): Payer: Medicare Other

## 2016-03-11 ENCOUNTER — Encounter (HOSPITAL_COMMUNITY): Payer: Self-pay | Admitting: Internal Medicine

## 2016-03-11 DIAGNOSIS — S42309A Unspecified fracture of shaft of humerus, unspecified arm, initial encounter for closed fracture: Secondary | ICD-10-CM | POA: Diagnosis present

## 2016-03-11 DIAGNOSIS — S42202A Unspecified fracture of upper end of left humerus, initial encounter for closed fracture: Secondary | ICD-10-CM | POA: Diagnosis not present

## 2016-03-11 DIAGNOSIS — S42209A Unspecified fracture of upper end of unspecified humerus, initial encounter for closed fracture: Secondary | ICD-10-CM | POA: Diagnosis present

## 2016-03-11 LAB — BASIC METABOLIC PANEL
ANION GAP: 8 (ref 5–15)
ANION GAP: 6 (ref 5–15)
BUN: 22 mg/dL — AB (ref 6–20)
BUN: 22 mg/dL — ABNORMAL HIGH (ref 6–20)
CALCIUM: 5.4 mg/dL — AB (ref 8.9–10.3)
CHLORIDE: 112 mmol/L — AB (ref 101–111)
CO2: 13 mmol/L — AB (ref 22–32)
CO2: 17 mmol/L — ABNORMAL LOW (ref 22–32)
Calcium: 5.4 mg/dL — CL (ref 8.9–10.3)
Chloride: 114 mmol/L — ABNORMAL HIGH (ref 101–111)
Creatinine, Ser: 1.51 mg/dL — ABNORMAL HIGH (ref 0.61–1.24)
Creatinine, Ser: 1.4 mg/dL — ABNORMAL HIGH (ref 0.61–1.24)
GFR calc Af Amer: 51 mL/min — ABNORMAL LOW (ref >60)
GFR calc non Af Amer: 44 mL/min — ABNORMAL LOW (ref >60)
GFR calc non Af Amer: 48 mL/min — ABNORMAL LOW (ref >60)
GFR, EST AFRICAN AMERICAN: 56 mL/min — AB (ref >60)
GLUCOSE: 238 mg/dL — AB (ref 65–99)
Glucose, Bld: 150 mg/dL — ABNORMAL HIGH (ref 65–99)
POTASSIUM: 5.3 mmol/L — AB (ref 3.5–5.1)
Potassium: 5.6 mmol/L — ABNORMAL HIGH (ref 3.5–5.1)
SODIUM: 135 mmol/L (ref 135–145)
Sodium: 135 mmol/L (ref 135–145)

## 2016-03-11 LAB — CBC WITH DIFFERENTIAL/PLATELET
BASOS ABS: 0 10*3/uL (ref 0.0–0.1)
Basophils Relative: 0 %
Eosinophils Absolute: 0.2 10*3/uL (ref 0.0–0.7)
Eosinophils Relative: 2 %
HEMATOCRIT: 29.7 % — AB (ref 39.0–52.0)
Hemoglobin: 9.3 g/dL — ABNORMAL LOW (ref 13.0–17.0)
LYMPHS ABS: 1.7 10*3/uL (ref 0.7–4.0)
LYMPHS PCT: 20 %
MCH: 24.5 pg — AB (ref 26.0–34.0)
MCHC: 31.3 g/dL (ref 30.0–36.0)
MCV: 78.4 fL (ref 78.0–100.0)
MONO ABS: 0.4 10*3/uL (ref 0.1–1.0)
MONOS PCT: 4 %
NEUTROS ABS: 6.2 10*3/uL (ref 1.7–7.7)
Neutrophils Relative %: 74 %
Platelets: 435 10*3/uL — ABNORMAL HIGH (ref 150–400)
RBC: 3.79 MIL/uL — ABNORMAL LOW (ref 4.22–5.81)
RDW: 17.1 % — AB (ref 11.5–15.5)
WBC: 8.4 10*3/uL (ref 4.0–10.5)

## 2016-03-11 LAB — GLUCOSE, CAPILLARY
GLUCOSE-CAPILLARY: 73 mg/dL (ref 65–99)
GLUCOSE-CAPILLARY: 101 mg/dL — AB (ref 65–99)
Glucose-Capillary: 130 mg/dL — ABNORMAL HIGH (ref 65–99)

## 2016-03-11 MED ORDER — GABAPENTIN 400 MG PO CAPS
800.0000 mg | ORAL_CAPSULE | Freq: Three times a day (TID) | ORAL | Status: DC
  Administered 2016-03-11 – 2016-03-15 (×14): 800 mg via ORAL
  Filled 2016-03-11 (×14): qty 2

## 2016-03-11 MED ORDER — LORAZEPAM 0.5 MG PO TABS
0.5000 mg | ORAL_TABLET | Freq: Two times a day (BID) | ORAL | Status: DC | PRN
  Administered 2016-03-12 – 2016-03-15 (×6): 0.5 mg via ORAL
  Filled 2016-03-11 (×6): qty 1

## 2016-03-11 MED ORDER — ACETAMINOPHEN 650 MG RE SUPP: 650.0000 mg | Freq: Four times a day (QID) | RECTAL | Status: DC | PRN

## 2016-03-11 MED ORDER — ONDANSETRON HCL 4 MG/2ML IJ SOLN: 4.0000 mg | Freq: Four times a day (QID) | INTRAMUSCULAR | Status: DC | PRN

## 2016-03-11 MED ORDER — SODIUM CHLORIDE 0.9 % IV SOLN: 1.0000 g | Freq: Once | INTRAVENOUS | Status: DC

## 2016-03-11 MED ORDER — AMLODIPINE BESYLATE 10 MG PO TABS
10.0000 mg | ORAL_TABLET | Freq: Every day | ORAL | Status: DC
  Administered 2016-03-11 – 2016-03-15 (×5): 10 mg via ORAL
  Filled 2016-03-11 (×3): qty 1
  Filled 2016-03-11: qty 2
  Filled 2016-03-11: qty 1

## 2016-03-11 MED ORDER — HYDROMORPHONE HCL 2 MG PO TABS
2.0000 mg | ORAL_TABLET | ORAL | Status: DC | PRN
  Administered 2016-03-11 – 2016-03-15 (×30): 2 mg via ORAL
  Filled 2016-03-11 (×30): qty 1

## 2016-03-11 MED ORDER — MECLIZINE HCL 25 MG PO TABS: 25.0000 mg | ORAL_TABLET | Freq: Every day | ORAL | Status: DC | PRN

## 2016-03-11 MED ORDER — INSULIN ASPART PROT & ASPART (70-30 MIX) 100 UNIT/ML ~~LOC~~ SUSP
30.0000 [IU] | Freq: Two times a day (BID) | SUBCUTANEOUS | Status: DC
  Administered 2016-03-11 – 2016-03-14 (×4): 10 [IU] via SUBCUTANEOUS
  Filled 2016-03-11 (×3): qty 10

## 2016-03-11 MED ORDER — CALCIUM CARBONATE-VITAMIN D 500-200 MG-UNIT PO TABS
2.0000 | ORAL_TABLET | Freq: Every day | ORAL | Status: DC
  Administered 2016-03-11 – 2016-03-12 (×2): 2 via ORAL
  Filled 2016-03-11 (×2): qty 2

## 2016-03-11 MED ORDER — CARVEDILOL 12.5 MG PO TABS
12.5000 mg | ORAL_TABLET | Freq: Two times a day (BID) | ORAL | Status: DC
  Administered 2016-03-11 – 2016-03-15 (×9): 12.5 mg via ORAL
  Filled 2016-03-11 (×9): qty 1

## 2016-03-11 MED ORDER — ENOXAPARIN SODIUM 40 MG/0.4ML ~~LOC~~ SOLN
40.0000 mg | SUBCUTANEOUS | Status: DC
  Administered 2016-03-11 – 2016-03-15 (×5): 40 mg via SUBCUTANEOUS
  Filled 2016-03-11 (×4): qty 0.4

## 2016-03-11 MED ORDER — OXYCODONE-ACETAMINOPHEN 5-325 MG PO TABS
1.0000 | ORAL_TABLET | ORAL | Status: DC | PRN
  Administered 2016-03-11 – 2016-03-15 (×9): 1 via ORAL
  Filled 2016-03-11 (×10): qty 1

## 2016-03-11 MED ORDER — SODIUM POLYSTYRENE SULFONATE 15 GM/60ML PO SUSP
15.0000 g | Freq: Once | ORAL | Status: AC
  Administered 2016-03-11: 15 g via ORAL
  Filled 2016-03-11: qty 60

## 2016-03-11 MED ORDER — OXYCODONE-ACETAMINOPHEN 5-325 MG PO TABS: 1.0000 | ORAL_TABLET | ORAL | Status: DC | PRN

## 2016-03-11 MED ORDER — FENTANYL CITRATE (PF) 100 MCG/2ML IJ SOLN
50.0000 ug | Freq: Once | INTRAMUSCULAR | Status: AC
  Administered 2016-03-11: 50 ug via NASAL
  Filled 2016-03-11: qty 2

## 2016-03-11 MED ORDER — MORPHINE SULFATE (PF) 4 MG/ML IV SOLN
4.0000 mg | Freq: Once | INTRAVENOUS | Status: AC
  Administered 2016-03-11: 4 mg via INTRAMUSCULAR
  Filled 2016-03-11: qty 1

## 2016-03-11 MED ORDER — OXYCODONE-ACETAMINOPHEN 5-325 MG PO TABS
2.0000 | ORAL_TABLET | Freq: Once | ORAL | Status: AC
  Administered 2016-03-11: 2 via ORAL
  Filled 2016-03-11: qty 2

## 2016-03-11 MED ORDER — HYDROMORPHONE HCL 2 MG PO TABS
2.0000 mg | ORAL_TABLET | ORAL | Status: DC | PRN
  Administered 2016-03-11: 2 mg via ORAL
  Filled 2016-03-11: qty 1

## 2016-03-11 MED ORDER — ATORVASTATIN CALCIUM 10 MG PO TABS
20.0000 mg | ORAL_TABLET | Freq: Every day | ORAL | Status: DC
  Administered 2016-03-11 – 2016-03-15 (×5): 20 mg via ORAL
  Filled 2016-03-11 (×2): qty 2
  Filled 2016-03-11: qty 1
  Filled 2016-03-11 (×3): qty 2

## 2016-03-11 MED ORDER — OXYCODONE-ACETAMINOPHEN 5-325 MG PO TABS: 1.0000 | ORAL_TABLET | Freq: Four times a day (QID) | ORAL | 0 refills | Status: AC | PRN

## 2016-03-11 MED ORDER — INSULIN ASPART 100 UNIT/ML ~~LOC~~ SOLN
0.0000 [IU] | Freq: Three times a day (TID) | SUBCUTANEOUS | Status: DC
  Administered 2016-03-11: 1 [IU] via SUBCUTANEOUS
  Administered 2016-03-13 (×2): 2 [IU] via SUBCUTANEOUS
  Administered 2016-03-13: 1 [IU] via SUBCUTANEOUS
  Administered 2016-03-14: 2 [IU] via SUBCUTANEOUS
  Administered 2016-03-14 – 2016-03-15 (×3): 1 [IU] via SUBCUTANEOUS

## 2016-03-11 MED ORDER — ACETAMINOPHEN 325 MG PO TABS
650.0000 mg | ORAL_TABLET | Freq: Four times a day (QID) | ORAL | Status: DC | PRN
  Administered 2016-03-13 – 2016-03-14 (×4): 650 mg via ORAL
  Filled 2016-03-11 (×5): qty 2

## 2016-03-11 MED ORDER — ONDANSETRON HCL 4 MG PO TABS
4.0000 mg | ORAL_TABLET | Freq: Four times a day (QID) | ORAL | Status: DC | PRN
  Administered 2016-03-14: 4 mg via ORAL
  Filled 2016-03-11: qty 1

## 2016-03-11 MED ORDER — METHOCARBAMOL 500 MG PO TABS
500.0000 mg | ORAL_TABLET | Freq: Four times a day (QID) | ORAL | Status: DC | PRN
  Administered 2016-03-12 – 2016-03-15 (×4): 500 mg via ORAL
  Filled 2016-03-11 (×4): qty 1

## 2016-03-11 MED ORDER — ASPIRIN EC 325 MG PO TBEC
325.0000 mg | DELAYED_RELEASE_TABLET | Freq: Every day | ORAL | Status: DC
  Administered 2016-03-11 – 2016-03-15 (×5): 325 mg via ORAL
  Filled 2016-03-11 (×5): qty 1

## 2016-03-11 MED ORDER — PANTOPRAZOLE SODIUM 40 MG PO TBEC
40.0000 mg | DELAYED_RELEASE_TABLET | Freq: Every day | ORAL | Status: DC
  Administered 2016-03-11 – 2016-03-15 (×5): 40 mg via ORAL
  Filled 2016-03-11 (×5): qty 1

## 2016-03-11 NOTE — ED Notes (Signed)
Gave patient glass of water.  Wife at bedside

## 2016-03-11 NOTE — Discharge Instructions (Signed)
Apply ice to your shoulder 3-4 times per day for at least 15 minutes each time. Wear a shoulder immobilizer at all times; however, you may remove it when showering if needed. Take Percocet as needed for pain control. Follow-up with an orthopedic surgeon. You may return to the emergency department for any new or concerning symptoms.

## 2016-03-11 NOTE — ED Notes (Signed)
Per Karna Christmas, RN, pt to not be stuck for labs due to pt being a very difficult stick Labs are to be drawn via PICC line once inserted

## 2016-03-11 NOTE — ED Provider Notes (Signed)
Patient seen/examined in the Emergency Department in conjunction with Midlevel Provider [facility] Patient reports left shoulder pain after fall.  Denies head injury or LOC Exam : awake/alert, tenderness/swelling noted to left shoulder.  Distal pulses intact Plan: pt with left proximal humerus fracture, will need immobilization and orthopedics followup    Ripley Fraise, MD 03/11/16 0117

## 2016-03-11 NOTE — Progress Notes (Signed)
Frank Ashley is a 75 y.o. male with metastatic renal cell carcinoma being treated at [facility] presents to the ER after patient had a fall. He was found to have left humeral fracture and was put in a sling and an immobilizer. Pt was admitted for pain relief and observation.  He was also found to have hyperkalemia and hypocalcemia.  He was ordered IV calcium gluconate, but since he didn't have IV access, we had to given oral calcium with viamin D.  Repeat labs ordered for tomorrow. Pain control with percocet and oral dilaudid.  Ortho pedics consulted and outpatient follow up recommended.   Hosie Poisson, MD [phone]

## 2016-03-11 NOTE — ED Notes (Signed)
Pt voices that he may just want to go home.  Request to talk to provider.  PA made aware.

## 2016-03-11 NOTE — Progress Notes (Signed)
Patient ID: Frank Ashley, male   DOB: 1940-09-12, 75 y.o.   MRN: [id number] I have reviewed the x-rays of his left shoulder.  No surgery is indicated for this stable fracture.  He does need a sling for comfort and no lifting with that left arm until further notice.

## 2016-03-11 NOTE — Consult Note (Signed)
Reason for Consult:  Left proximal humerus fracture Referring Physician: EDP  Frank Ashley is an 75 y.o. male.  HPI:   75 yo male who injured his left shoulder after a mechanical fall last evening.  Was seen at the ED and found to have a left 2-part proximal humerus fracture.  He was placed appropriately in a sling.  Ortho is consulted for further evaluation and treatment.  The patient does report significant left shoulder pain and discomfort.  He denies other injuries.  Past Medical History:  Diagnosis Date  . Acute pancreatitis 03/12/2010   Qualifier: Diagnosis of  By: Elease Hashimoto MD, Bruce    . Anxiety    SINCE MAY 2013 AND DIAGNOSIS OF CANCER-PT WAS HAVING PANIC ATTACKS-FELT LIKE HE COULDN'T BREATHE--STATES HE IS FEELING BETTER ON ALPRAZOLAM  . Arthritis HANDS  . BPH (benign prostatic hypertrophy)   . CAD (coronary artery disease)    Circumflex stent 1996  //  catheterization January, 2000, 50-60% mid LAD,  dominant circumflex-50% in-stent restenosis similar to a cath in 1999, RCA small and nondominant,, EF 60%  . 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 Choledocholithiasis with chronic cholecystitis    BILE DUCT STONES AND GALLSTONES  . Complication of anesthesia clostraphobic  . Diabetes mellitus type 2, insulin dependent ([place])   . Diabetic foot ulcer ([place]) CURRENT RIGHT GREAT TOE WOUND W/ DRAINAGE --  DRESSING CHANGE EVERY OTHER DAY  . DIABETIC FOOT ULCER, TOE 08/01/2008   Qualifier: Diagnosis of  By: Elease Hashimoto MD, Bruce    . Ejection fraction    EF 60%, catheterization, 2000  //   EF 55-60%, echo, July, 2013  . Frequency of urination   . Generalized rash RIGHT SHOULDER/ BACK AREA  . GERD (gastroesophageal reflux disease)   . Hematuria    YRS AGO  . History of acute pancreatitis 2008  . History of gout STABLE PER PT  . History of kidney stones AGE 79  . History of MRSA infection 10 YRS AGO /  SUPERFICIAL SKIN AREA  . History of peptic ulcer disease   .  Hyperlipidemia   . Hypertension   . Left leg numbness COMPLETE NUMBNESS FROM HIP DOWN TO FOOT  . Nocturia   . Numbness in right leg FROM KNEE DOWN TO FOOT  . OSA (obstructive sleep apnea) CPAP USED UP UNTIL 2 YRS AGO  WHEN MASK WAS DAMAGED   . Peripheral neuropathy ([place])   . Preop cardiovascular exam    Cardiac clearance for nephrectomy and cholecystectomy June, 2013  . Renal insufficiency   . Renal mass, right     Past Surgical History:  Procedure Laterality Date  . BILROTH II PROCEDURE  AGE 79   2 MONTHS LATER,  BILROTH II RECONSTRUCTION  . CARDIAC CATHETERIZATION  05-04-1998--- REPORT W/ CHART   50%  IN-STENT RESTENOSIS OF CIRCUMFLEX/ 20% LEFT MAIN/ 50-60% LAD/ RCA NORMAL/ LV NORMAL / EF 60%  . CATARACT EXTRACTION W/ INTRAOCULAR LENS  IMPLANT, BILATERAL  2005  . CHOLECYSTECTOMY  12/20/2011   Procedure: CHOLECYSTECTOMY WITH COMMON DUCT EXPLORATION;  Surgeon: Marcello Moores A. Cornett, MD;  Location: [facility];  Service: General;  Laterality: N/A;  choloductoscopy  . CORONARY ANGIOPLASTY WITH STENT PLACEMENT  1996   STENTING X1 CIRCUMFLEX  . CT PERC CHOLECYSTOSTOMY  09-17-2011  . CYSTOSCOPY/RETROGRADE/URETEROSCOPY  10/26/2011   Procedure: CYSTOSCOPY/RETROGRADE/URETEROSCOPY;  Surgeon: Fredricka Bonine, MD;  Location: [facility];  Service: Urology;  Laterality: N/A;  RIGHT URETEROSCOPY  AND RIGHT RETROGRADE PYELOGRAM  DIABETIC C ARM   . ESOPHAGOGASTRODUODENOSCOPY  09/16/2011   Procedure: ESOPHAGOGASTRODUODENOSCOPY (EGD);  Surgeon: Missy Sabins, MD;  Location: [facility];  Service: Endoscopy;  Laterality: N/A;  . INTRAOPERATIVE CHOLANGIOGRAM  12/20/2011   Procedure: INTRAOPERATIVE CHOLANGIOGRAM;  Surgeon: Joyice Faster. Cornett, MD;  Location: [facility];  Service: General;;  . INTRAOPERATIVE CHOLANGIOGRAM  12/24/2011   Procedure: INTRAOPERATIVE CHOLANGIOGRAM;  Surgeon: Joyice Faster. Cornett, MD;  Location: [facility];  Service: General;  Laterality: N/A;  . LAPAROTOMY  12/24/2011    Procedure: EXPLORATORY LAPAROTOMY;  Surgeon: Joyice Faster. Cornett, MD;  Location: [facility];  Service: General;  Laterality: N/A;  Replacement of T-tube and Bile duct exploration  . PARTIAL NEPHRECTOMY  12/20/2011   Procedure: NEPHRECTOMY PARTIAL;  Surgeon: Fredricka Bonine, MD;  Location: [facility];  Service: Urology;  Laterality: Right;  Right Partial nephrectomy  . PENILE PROSTHESIS IMPLANT  2008   REMOVED 2010 DUE TO MALFUNCTION  . REMOVAL OF PENILE PROSTHESIS    . RIGHT GREAT TOE SURG.  2000  . SURG FOR REPAIR OF PERFERATED PEPTIC ULCER  AGE 33    Family History  Problem Relation Age of Onset  . Colon cancer Mother 40  . Colon cancer Sister 82  . Colon cancer Brother 72  . Stomach cancer Neg Hx     Social History:  reports that he quit smoking about 56 years ago. His smoking use included Cigarettes. He has a 0.50 pack-year smoking history. He has never used smokeless tobacco. He reports that he does not drink alcohol or use drugs.  Allergies:  Allergies  Allergen Reactions  . Codeine Shortness Of Breath    SOB, NERVOUSNESS  . Hydrocodone Shortness Of Breath  . Oxycodone Shortness Of Breath    SOB, NERVOUSNESS  . Sulfa Antibiotics Rash    Medications: I have reviewed the patient's current medications.  Results for orders placed or performed during the hospital encounter of 03/10/16 (from the past 48 hour(s))  CBC with Differential     Status: Abnormal   Collection Time: 03/11/16  2:30 AM  Result Value Ref Range   WBC 8.4 4.0 - 10.5 K/uL   RBC 3.79 (L) 4.22 - 5.81 MIL/uL   Hemoglobin 9.3 (L) 13.0 - 17.0 g/dL   HCT 29.7 (L) 39.0 - 52.0 %   MCV 78.4 78.0 - 100.0 fL   MCH 24.5 (L) 26.0 - 34.0 pg   MCHC 31.3 30.0 - 36.0 g/dL   RDW 17.1 (H) 11.5 - 15.5 %   Platelets 435 (H) 150 - 400 K/uL   Neutrophils Relative % 74 %   Neutro Abs 6.2 1.7 - 7.7 K/uL   Lymphocytes Relative 20 %   Lymphs Abs 1.7 0.7 - 4.0 K/uL   Monocytes Relative 4 %   Monocytes Absolute 0.4 0.1 - 1.0  K/uL   Eosinophils Relative 2 %   Eosinophils Absolute 0.2 0.0 - 0.7 K/uL   Basophils Relative 0 %   Basophils Absolute 0.0 0.0 - 0.1 K/uL  Basic metabolic panel     Status: Abnormal   Collection Time: 03/11/16  2:30 AM  Result Value Ref Range   Sodium 135 135 - 145 mmol/L   Potassium 5.6 (H) 3.5 - 5.1 mmol/L   Chloride 114 (H) 101 - 111 mmol/L   CO2 13 (L) 22 - 32 mmol/L   Glucose, Bld 238 (H) 65 - 99 mg/dL   BUN 22 (H) 6 - 20 mg/dL  Creatinine, Ser 1.51 (H) 0.61 - 1.24 mg/dL   Calcium 5.4 (LL) 8.9 - 10.3 mg/dL    Comment: REPEATED TO VERIFY CRITICAL RESULT CALLED TO, READ BACK BY AND VERIFIED WITH: NASH,J RN 11.9.17 _0  ZANDO,C    GFR calc non Af Amer 44 (L) >60 mL/min   GFR calc Af Amer 51 (L) >60 mL/min    Comment: (NOTE) The eGFR has been calculated using the CKD EPI equation. This calculation has not been validated in all clinical situations. eGFR's persistently <60 mL/min signify possible Chronic Kidney Disease.    Anion gap 8 5 - 15  Glucose, capillary     Status: Abnormal   Collection Time: 03/11/16  9:10 AM  Result Value Ref Range   Glucose-Capillary 130 (H) 65 - 99 mg/dL  Basic metabolic panel     Status: Abnormal   Collection Time: 03/11/16  9:43 AM  Result Value Ref Range   Sodium 135 135 - 145 mmol/L   Potassium 5.3 (H) 3.5 - 5.1 mmol/L    Comment: NO VISIBLE HEMOLYSIS   Chloride 112 (H) 101 - 111 mmol/L   CO2 17 (L) 22 - 32 mmol/L   Glucose, Bld 150 (H) 65 - 99 mg/dL   BUN 22 (H) 6 - 20 mg/dL   Creatinine, Ser 1.40 (H) 0.61 - 1.24 mg/dL   Calcium 5.4 (LL) 8.9 - 10.3 mg/dL    Comment: CRITICAL RESULT CALLED TO, READ BACK BY AND VERIFIED WITH: MORGAN,M. RN AT 1038 03/11/16 MULLINS,T    GFR calc non Af Amer 48 (L) >60 mL/min   GFR calc Af Amer 56 (L) >60 mL/min    Comment: (NOTE) The eGFR has been calculated using the CKD EPI equation. This calculation has not been validated in all clinical situations. eGFR's persistently <60 mL/min signify possible  Chronic Kidney Disease.    Anion gap 6 5 - 15  Glucose, capillary     Status: None   Collection Time: 03/11/16 12:53 PM  Result Value Ref Range   Glucose-Capillary 73 65 - 99 mg/dL  Glucose, capillary     Status: Abnormal   Collection Time: 03/11/16  5:27 PM  Result Value Ref Range   Glucose-Capillary 101 (H) 65 - 99 mg/dL    Dg Shoulder Left  Result Date: 03/11/2016 CLINICAL DATA:  Trip and fall injury.  Left shoulder deformity. EXAM: LEFT SHOULDER - 2+ VIEW COMPARISON:  None. FINDINGS: Comminuted fractures of the left proximal humerus with transverse impacted fracture across the left humeral surgical neck and longitudinal fractures extending over the base of the greater trochanter. Mild lateral angulation of the distal fracture fragments. Glenohumeral joint appears grossly intact although visualization is limited due to patient positioning. IMPRESSION: Comminuted fractures of the proximal left humerus. Electronically Signed   By: Lucienne Capers M.D.   On: 03/11/2016 00:41    ROS Blood pressure (!) 139/97, pulse 83, temperature 97.9 F (36.6 C), temperature source Oral, resp. rate 15, height _1  (1.854 m), weight 186 lb 11.7 oz (84.7 kg), SpO2 100 %. Physical Exam  Constitutional: He is oriented to person, place, and time. He appears well-developed and well-nourished.  HENT:  Head: Normocephalic and atraumatic.  Neck: Normal range of motion.  Cardiovascular: Normal rate.   Respiratory: Effort normal.  GI: Soft.  Musculoskeletal:       Left shoulder: He exhibits decreased range of motion, tenderness, bony tenderness, swelling and decreased strength.  Neurological: He is alert and oriented to person, place, and time.  Skin: Skin is warm and dry.  Psychiatric: He has a normal mood and affect.    Assessment/Plan: Left 2-part closed proximal humerus fracture (surgical neck) 1)  I spoke to him and his wife at the bedside.  He understands fully that the best treatment for his  left shoulder fracture is a sling and limited movement of the shoulder until the healing process allows for motion.  Will order PT and OT to assist with mobility and ADL's.  Frank Ashley 03/11/2016, 8:25 PM

## 2016-03-11 NOTE — ED Notes (Signed)
Report to [place], ready for patient

## 2016-03-11 NOTE — H&P (Signed)
History and Physical    Frank Ashley [id number] DOB: [birth year] DOA: 03/10/2016  PCP: Eulas Post, MD  Patient coming from: Home.  Chief Complaint: Left shoulder pain.  HPI: GARREY SIGLIN is a 75 y.o. male with metastatic renal cell carcinoma being treated at [facility] presents to the ER after patient had a fall. Patient states last night he tripped on a cable and fell. Denies hitting his head or losing consciousness. X-rays reveal left humeral fracture and patient was placed in a sling and immobilizer. Despite getting multiple doses of pain relief medications patient still has significant pain and has been admitted for pain control and observation. ER physician did discuss with Dr. Ninfa Linden on call orthopedic surgeon who advised follow-up as outpatient.  Patient's labs also revealed hypocalcemia which has been ongoing recently admitted to [facility]. Patient's oncologist as planned to start patient on oral calcium and vitamin D which patient is is about to pick up from the pharmacy.   Patient otherwise denies any chest pain shortness of breath palpitations.  ED Course: X-rays reveal left humeral fracture.   Review of Systems: As per HPI, rest all negative.   Past Medical History:  Diagnosis Date  . Acute pancreatitis 03/12/2010   Qualifier: Diagnosis of  By: Elease Hashimoto MD, Bruce    . Anxiety    SINCE MAY 2013 AND DIAGNOSIS OF CANCER-PT WAS HAVING PANIC ATTACKS-FELT LIKE HE COULDN'T BREATHE--STATES HE IS FEELING BETTER ON ALPRAZOLAM  . Arthritis HANDS  . BPH (benign prostatic hypertrophy)   . CAD (coronary artery disease)    Circumflex stent 1996  //  catheterization January, 2000, 50-60% mid LAD,  dominant circumflex-50% in-stent restenosis similar to a cath in 1999, RCA small and nondominant,, EF 60%  . Cancer [facility])    CANCER IN RIGHT KIDNEY-SURGERY PLANNED  . Choledocholithiasis with chronic cholecystitis    BILE DUCT STONES AND GALLSTONES  . Complication of  anesthesia clostraphobic  . Diabetes mellitus type 2, insulin dependent ([place])   . Diabetic foot ulcer ([place]) CURRENT RIGHT GREAT TOE WOUND W/ DRAINAGE --  DRESSING CHANGE EVERY OTHER DAY  . DIABETIC FOOT ULCER, TOE 08/01/2008   Qualifier: Diagnosis of  By: Elease Hashimoto MD, Bruce    . Ejection fraction    EF 60%, catheterization, 2000  //   EF 55-60%, echo, July, 2013  . Frequency of urination   . Generalized rash RIGHT SHOULDER/ BACK AREA  . GERD (gastroesophageal reflux disease)   . Hematuria    YRS AGO  . History of acute pancreatitis 2008  . History of gout STABLE PER PT  . History of kidney stones AGE 15  . History of MRSA infection 10 YRS AGO /  SUPERFICIAL SKIN AREA  . History of peptic ulcer disease   . Hyperlipidemia   . Hypertension   . Left leg numbness COMPLETE NUMBNESS FROM HIP DOWN TO FOOT  . Nocturia   . Numbness in right leg FROM KNEE DOWN TO FOOT  . OSA (obstructive sleep apnea) CPAP USED UP UNTIL 2 YRS AGO  WHEN MASK WAS DAMAGED   . Peripheral neuropathy ([place])   . Preop cardiovascular exam    Cardiac clearance for nephrectomy and cholecystectomy June, 2013  . Renal insufficiency   . Renal mass, right     Past Surgical History:  Procedure Laterality Date  . BILROTH II PROCEDURE  AGE 16   2 MONTHS LATER,  BILROTH II RECONSTRUCTION  . CARDIAC CATHETERIZATION  05-04-1998--- REPORT W/ CHART  50%  IN-STENT RESTENOSIS OF CIRCUMFLEX/ 20% LEFT MAIN/ 50-60% LAD/ RCA NORMAL/ LV NORMAL / EF 60%  . CATARACT EXTRACTION W/ INTRAOCULAR LENS  IMPLANT, BILATERAL  2005  . CHOLECYSTECTOMY  12/20/2011   Procedure: CHOLECYSTECTOMY WITH COMMON DUCT EXPLORATION;  Surgeon: Marcello Moores A. Cornett, MD;  Location: [facility];  Service: General;  Laterality: N/A;  choloductoscopy  . CORONARY ANGIOPLASTY WITH STENT PLACEMENT  1996   STENTING X1 CIRCUMFLEX  . CT PERC CHOLECYSTOSTOMY  09-17-2011  . CYSTOSCOPY/RETROGRADE/URETEROSCOPY  10/26/2011   Procedure: CYSTOSCOPY/RETROGRADE/URETEROSCOPY;  Surgeon:  Fredricka Bonine, MD;  Location: [facility];  Service: Urology;  Laterality: N/A;  RIGHT URETEROSCOPY AND RIGHT RETROGRADE PYELOGRAM  DIABETIC C ARM   . ESOPHAGOGASTRODUODENOSCOPY  09/16/2011   Procedure: ESOPHAGOGASTRODUODENOSCOPY (EGD);  Surgeon: Missy Sabins, MD;  Location: [facility];  Service: Endoscopy;  Laterality: N/A;  . INTRAOPERATIVE CHOLANGIOGRAM  12/20/2011   Procedure: INTRAOPERATIVE CHOLANGIOGRAM;  Surgeon: Joyice Faster. Cornett, MD;  Location: [facility];  Service: General;;  . INTRAOPERATIVE CHOLANGIOGRAM  12/24/2011   Procedure: INTRAOPERATIVE CHOLANGIOGRAM;  Surgeon: Joyice Faster. Cornett, MD;  Location: [facility];  Service: General;  Laterality: N/A;  . LAPAROTOMY  12/24/2011   Procedure: EXPLORATORY LAPAROTOMY;  Surgeon: Joyice Faster. Cornett, MD;  Location: [facility];  Service: General;  Laterality: N/A;  Replacement of T-tube and Bile duct exploration  . PARTIAL NEPHRECTOMY  12/20/2011   Procedure: NEPHRECTOMY PARTIAL;  Surgeon: Fredricka Bonine, MD;  Location: [facility];  Service: Urology;  Laterality: Right;  Right Partial nephrectomy  . PENILE PROSTHESIS IMPLANT  2008   REMOVED 2010 DUE TO MALFUNCTION  . REMOVAL OF PENILE PROSTHESIS    . RIGHT GREAT TOE SURG.  2000  . SURG FOR REPAIR OF PERFERATED PEPTIC ULCER  AGE 54     reports that he quit smoking about 56 years ago. His smoking use included Cigarettes. He has a 0.50 pack-year smoking history. He has never used smokeless tobacco. He reports that he does not drink alcohol or use drugs.  Allergies  Allergen Reactions  . Codeine Shortness Of Breath    SOB, NERVOUSNESS  . Hydrocodone Shortness Of Breath  . Oxycodone Shortness Of Breath    SOB, NERVOUSNESS  . Sulfa Antibiotics Rash    Family History  Problem Relation Age of Onset  . Colon cancer Mother 22  . Colon cancer Sister 72  . Colon cancer Brother 13  . Stomach cancer Neg Hx     Prior to Admission medications   Medication Sig Start Date End  Date Taking? Authorizing Provider  amLODipine (NORVASC) 10 MG tablet Take 10 mg by mouth daily.  04/10/13  Yes Historical Provider, MD  amoxicillin (AMOXIL) 500 MG capsule Take 2,000 mg by mouth See admin instructions. Prior to dental appointment 11/12/15  Yes Historical Provider, MD  aspirin 325 MG EC tablet Take 325 mg by mouth daily.    Yes Historical Provider, MD  atorvastatin (LIPITOR) 20 MG tablet Take 20 mg by mouth daily.   Yes Historical Provider, MD  carvedilol (COREG) 12.5 MG tablet Take 12.5 mg by mouth 2 (two) times daily. 09/10/15  Yes Historical Provider, MD  gabapentin (NEURONTIN) 800 MG tablet Take 1 tablet (800 mg total) by mouth 3 (three) times daily. 02/18/16  Yes Eulas Post, MD  HYDROmorphone (DILAUDID) 2 MG tablet Take 2 mg by mouth every 4 (four) hours as needed for severe pain.  01/10/16  Yes Historical Provider, MD  insulin NPH-regular Human (NOVOLIN 70/30  RELION) (70-30) 100 UNIT/ML injection INJECT 80 UNITS SUBCUTANEOUSLY IN THE MORNING AND 70 IN THE EVENING DX E11.9 Patient taking differently: INJECT 30 UNITS SUBCUTANEOUSLY IN THE MORNING AND 30 IN THE EVENING DX E11.9 04/18/15  Yes Eulas Post, MD  LORazepam (ATIVAN) 0.5 MG tablet Take 1 tablet (0.5 mg total) by mouth every 6 (six) hours as needed for anxiety. Patient taking differently: Take 0.5 mg by mouth 2 (two) times daily as needed for anxiety.  06/28/14  Yes Eulas Post, MD  meclizine (ANTIVERT) 25 MG tablet Take 25 mg by mouth daily as needed for dizziness. Reported on 08/01/2015 01/28/14  Yes Historical Provider, MD  omeprazole (PRILOSEC) 40 MG capsule Take 1 capsule (40 mg total) by mouth 2 (two) times daily. 03/09/16  Yes Eulas Post, MD  traMADol (ULTRAM) 50 MG tablet Take 50 mg by mouth every 6 (six) hours as needed for moderate pain.   Yes Historical Provider, MD  becaplermin (REGRANEX) 0.01 % gel Apply 1 application topically daily. Patient not taking: Reported on 03/11/2016 01/25/14   Myeong  O Sheard, DPM  benzonatate (TESSALON) 200 MG capsule Take 1 capsule (200 mg total) by mouth 2 (two) times daily as needed for cough. Patient not taking: Reported on 02/13/2016 10/10/14   Eulas Post, MD  glucose blood (TRUETRACK TEST) test strip USE ONE STRIP TO CHECK GLUCOSE THREE TIMES DAILY       DX E11.9 04/18/15   Eulas Post, MD  mirtazapine (REMERON) 15 MG tablet Take 1 tablet (15 mg total) by mouth at bedtime. Patient not taking: Reported on 03/11/2016 02/13/16   Eulas Post, MD  oxiconazole (OXISTAT) 1 % CREA Apply 1 application topically as needed. For SKIN RASHES Patient not taking: Reported on 03/11/2016 08/01/15   Marin Olp, MD  oxyCODONE-acetaminophen (PERCOCET/ROXICET) 5-325 MG tablet Take 1-2 tablets by mouth every 6 (six) hours as needed for severe pain. 03/11/16   Antonietta Breach, PA-C  triamcinolone cream (KENALOG) 0.1 % Apply topically 2 (two) times daily. Compound with Eucerin 1:1 and apply to affected area bid prn itching Patient not taking: Reported on 02/13/2016 04/03/12   Eulas Post, MD    Physical Exam: Vitals:   03/10/16 2344 03/10/16 2345 03/11/16 0330 03/11/16 0555  BP: 145/81  148/71 125/57  Pulse: 85  86 79  Resp: 20  (!) 7 18  Temp: 97.5 F (36.4 C)  99.1 F (37.3 C)   TempSrc: Oral  Oral   SpO2: 100%  100% 99%  Weight: 85.7 kg (189 lb) 85.7 kg (189 lb)    Height: 6\' 1"  (1.854 m) 6' (1.829 m)        Constitutional: Moderately built and nourished.  Vitals:   03/10/16 2344 03/10/16 2345 03/11/16 0330 03/11/16 0555  BP: 145/81  148/71 125/57  Pulse: 85  86 79  Resp: 20  (!) 7 18  Temp: 97.5 F (36.4 C)  99.1 F (37.3 C)   TempSrc: Oral  Oral   SpO2: 100%  100% 99%  Weight: 85.7 kg (189 lb) 85.7 kg (189 lb)    Height: 6\' 1"  (1.854 m) 6' (1.829 m)     Eyes: Anicteric. HEEMT: No discharge from the ears eyes nose or mouth. Neck: No mass felt. No JVD appreciated. Respiratory: No rhonchi or crepitations. Cardiovascular: S1  and S2 heard. Abdomen: Soft nontender bowel sounds present. Musculoskeletal: Left shoulder is in a sling. Skin: No rash. Chronic skin changes. Neurologic: Alert awake  oriented to time place and person. Moves all extremities. Psychiatric: Appears normal.   Labs on Admission: I have personally reviewed following labs and imaging studies  CBC:  Recent Labs Lab 03/11/16 0230  WBC 8.4  NEUTROABS 6.2  HGB 9.3*  HCT 29.7*  MCV 78.4  PLT [id number]*   Basic Metabolic Panel:  Recent Labs Lab 03/11/16 0230  NA 135  K 5.6*  CL 114*  CO2 13*  GLUCOSE 238*  BUN 22*  CREATININE 1.51*  CALCIUM 5.4*   GFR: Estimated Creatinine Clearance: 47.1 mL/min (by C-G formula based on SCr of 1.51 mg/dL (H)). Liver Function Tests: No results for input(s): AST, ALT, ALKPHOS, BILITOT, PROT, ALBUMIN in the last 168 hours. No results for input(s): LIPASE, AMYLASE in the last 168 hours. No results for input(s): AMMONIA in the last 168 hours. Coagulation Profile: No results for input(s): INR, PROTIME in the last 168 hours. Cardiac Enzymes: No results for input(s): CKTOTAL, CKMB, CKMBINDEX, TROPONINI in the last 168 hours. BNP (last 3 results) No results for input(s): PROBNP in the last 8760 hours. HbA1C: No results for input(s): HGBA1C in the last 72 hours. CBG: No results for input(s): GLUCAP in the last 168 hours. Lipid Profile: No results for input(s): CHOL, HDL, LDLCALC, TRIG, CHOLHDL, LDLDIRECT in the last 72 hours. Thyroid Function Tests: No results for input(s): TSH, T4TOTAL, FREET4, T3FREE, THYROIDAB in the last 72 hours. Anemia Panel: No results for input(s): VITAMINB12, FOLATE, FERRITIN, TIBC, IRON, RETICCTPCT in the last 72 hours. Urine analysis:    Component Value Date/Time   COLORURINE AMBER (A) 01/12/2016 0222   APPEARANCEUR CLOUDY (A) 01/12/2016 0222   LABSPEC 1.029 01/12/2016 0222   PHURINE 5.0 01/12/2016 0222   GLUCOSEU NEGATIVE 01/12/2016 0222   HGBUR LARGE (A) 01/12/2016  0222   BILIRUBINUR SMALL (A) 01/12/2016 0222   KETONESUR NEGATIVE 01/12/2016 0222   PROTEINUR 30 (A) 01/12/2016 0222   UROBILINOGEN 0.2 12/31/2011 2015   NITRITE NEGATIVE 01/12/2016 0222   LEUKOCYTESUR TRACE (A) 01/12/2016 0222   Sepsis Labs: @LABRCNTIP (procalcitonin:4,lacticidven:4) )No results found for this or any previous visit (from the past 240 hour(s)).   Radiological Exams on Admission: Dg Shoulder Left  Result Date: 03/11/2016 CLINICAL DATA:  Trip and fall injury.  Left shoulder deformity. EXAM: LEFT SHOULDER - 2+ VIEW COMPARISON:  None. FINDINGS: Comminuted fractures of the left proximal humerus with transverse impacted fracture across the left humeral surgical neck and longitudinal fractures extending over the base of the greater trochanter. Mild lateral angulation of the distal fracture fragments. Glenohumeral joint appears grossly intact although visualization is limited due to patient positioning. IMPRESSION: Comminuted fractures of the proximal left humerus. Electronically Signed   By: Lucienne Capers M.D.   On: 03/11/2016 00:41    EKG: Independently reviewed. Normal sinus rhythm with QTC of 453 ms.  Assessment/Plan Active Problems:   Hereditary and idiopathic peripheral neuropathy   Sleep apnea   CAD (coronary artery disease)   Hypertension   Type 2 diabetes, controlled, with peripheral neuropathy (HCC)   Metastatic renal cell carcinoma (HCC)   Hypocalcemia   Proximal humerus fracture   Humerus fracture    1. Left shoulder fracture status post mechanical fall - Dr. Ninfa Linden on call orthopedic surgeon has requested outpatient follow-up with him. Patient has to make the appointment. Since patient has significant pain patient is being admitted for pain control. Continue sling and immobilizer. For now patient is on oral Dilaudid (patient's home dose) until we get good IV access. 2.  Hypocalcemia - being attributed to [facility] by oncologist. Patient is placed on oral calcium  with vitamin D. Follow metabolic panel. No EKG changes. 3. Diabetes mellitus type 2 - continue home dose of insulin. 4. Hypertension - continue home medications. 5. CAD status post stenting - denies any chest pain. On aspirin Coreg and statins. 6. Chronic anemia - follow CBC. 7. Metastatic renal cell carcinoma - Being followed at the [facility]. 8. OSA. 9. Chronic kidney disease stage IV - creatinine appears to be at baseline.  DVT prophylaxis: Lovenox. Code Status: Full code.  Family Communication: Discussed with patient.  Disposition Plan: Home.  Consults called: ER physician discussed with orthopedic surgeon.  Admission status: Observation.    Rise Patience MD [facility] Pager [phone].  If 7PM-7AM, please contact night-coverage [email or web address] Password [email or web address]  03/11/2016, 6:42 AM

## 2016-03-11 NOTE — ED Notes (Signed)
RN tried IV attempt x1 with no success.  PA made aware.  No IV access necessary per results of x-ray.

## 2016-03-11 NOTE — Progress Notes (Signed)
PT Cancellation Note  Patient Details Name: Frank Ashley MRN: [id number] DOB: 1941/01/24   Cancelled Treatment:    Reason Eval/Treat Not Completed: Patient not medically ready (transferred to SDU)   Claretha Cooper 03/11/2016, 9:44 AM Tresa Endo PT [phone]

## 2016-03-11 NOTE — ED Notes (Signed)
Attempt to place IV access using Ultrasound by Dr Christy Gentles unsuccessful , admitting Physician Dr. Hal Hope notified.

## 2016-03-11 NOTE — Progress Notes (Signed)
Orthopedic Tech Progress Note Patient Details:  Frank Ashley August 12, 1940 [id number]  Ortho Devices Type of Ortho Device: Sling immobilizer Ortho Device/Splint Location: LUE Ortho Device/Splint Interventions: Application As per Doctors order applied Sling immobilzer.  Kristopher Oppenheim 03/11/2016, 1:39 AM

## 2016-03-11 NOTE — ED Notes (Signed)
Ortho at bedside.

## 2016-03-11 NOTE — Progress Notes (Signed)
Unable to get IV access.  Multiple RNs and IV team have attempted with no success.  Text Paged Dr. Karleen Hampshire and informed her.  Irven Baltimore, RN

## 2016-03-11 NOTE — ED Notes (Signed)
In to go over discharge instructions, patient states his pain is now too severe to go home, even though Percocet has been administered. Frank Ashley EDPA made aware.

## 2016-03-11 NOTE — ED Notes (Signed)
Patient declines further sticks for blood draws, Claiborne Billings EDPA made aware and order placed for PICC line

## 2016-03-11 NOTE — ED Notes (Signed)
Patient refused to change into gown. 

## 2016-03-11 NOTE — ED Notes (Signed)
Pt was upset when I came in the room this morning he was not able to pee by himself and he stated that he had been waiting for help for 1 hour. I helped the pt urinate and get comfortable in the bed, I attempted to change the pt into a gown but he would not let me the pt stated that he was in to much pain to move. Pt was unaware that he was being admitted and also seemed very upset that his wife might not know where he is.

## 2016-03-12 DIAGNOSIS — E43 Unspecified severe protein-calorie malnutrition: Secondary | ICD-10-CM | POA: Diagnosis not present

## 2016-03-12 DIAGNOSIS — C649 Malignant neoplasm of unspecified kidney, except renal pelvis: Secondary | ICD-10-CM

## 2016-03-12 DIAGNOSIS — S42202D Unspecified fracture of upper end of left humerus, subsequent encounter for fracture with routine healing: Secondary | ICD-10-CM

## 2016-03-12 DIAGNOSIS — L89899 Pressure ulcer of other site, unspecified stage: Secondary | ICD-10-CM | POA: Diagnosis not present

## 2016-03-12 DIAGNOSIS — S42212D Unspecified displaced fracture of surgical neck of left humerus, subsequent encounter for fracture with routine healing: Secondary | ICD-10-CM | POA: Diagnosis not present

## 2016-03-12 DIAGNOSIS — I10 Essential (primary) hypertension: Secondary | ICD-10-CM | POA: Diagnosis not present

## 2016-03-12 DIAGNOSIS — E1142 Type 2 diabetes mellitus with diabetic polyneuropathy: Secondary | ICD-10-CM

## 2016-03-12 LAB — GLUCOSE, CAPILLARY
GLUCOSE-CAPILLARY: 112 mg/dL — AB (ref 65–99)
Glucose-Capillary: 148 mg/dL — ABNORMAL HIGH (ref 65–99)
Glucose-Capillary: 163 mg/dL — ABNORMAL HIGH (ref 65–99)
Glucose-Capillary: 164 mg/dL — ABNORMAL HIGH (ref 65–99)
Glucose-Capillary: 222 mg/dL — ABNORMAL HIGH (ref 65–99)

## 2016-03-12 LAB — BASIC METABOLIC PANEL
Anion gap: 5 (ref 5–15)
BUN: 20 mg/dL (ref 6–20)
CHLORIDE: 113 mmol/L — AB (ref 101–111)
CO2: 18 mmol/L — AB (ref 22–32)
CREATININE: 1.29 mg/dL — AB (ref 0.61–1.24)
Calcium: 5.2 mg/dL — CL (ref 8.9–10.3)
GFR calc Af Amer: >60 mL/min (ref >60)
GFR calc non Af Amer: 53 mL/min — ABNORMAL LOW (ref >60)
GLUCOSE: 171 mg/dL — AB (ref 65–99)
Potassium: 5.4 mmol/L — ABNORMAL HIGH (ref 3.5–5.1)
Sodium: 136 mmol/L (ref 135–145)

## 2016-03-12 LAB — CBC
HCT: 28.1 % — ABNORMAL LOW (ref 39.0–52.0)
Hemoglobin: 8.9 g/dL — ABNORMAL LOW (ref 13.0–17.0)
MCH: 24.3 pg — AB (ref 26.0–34.0)
MCHC: 31.7 g/dL (ref 30.0–36.0)
MCV: 76.6 fL — AB (ref 78.0–100.0)
PLATELETS: 364 10*3/uL (ref 150–400)
RBC: 3.67 MIL/uL — AB (ref 4.22–5.81)
RDW: 17 % — AB (ref 11.5–15.5)
WBC: 6.9 10*3/uL (ref 4.0–10.5)

## 2016-03-12 LAB — ALBUMIN: Albumin: 1.9 g/dL — ABNORMAL LOW (ref 3.5–5.0)

## 2016-03-12 MED ORDER — CALCIUM CARBONATE-VITAMIN D 500-200 MG-UNIT PO TABS
2.0000 | ORAL_TABLET | Freq: Three times a day (TID) | ORAL | Status: DC
  Administered 2016-03-12 – 2016-03-15 (×9): 2 via ORAL
  Filled 2016-03-12 (×5): qty 2
  Filled 2016-03-12: qty 1
  Filled 2016-03-12: qty 2
  Filled 2016-03-12: qty 1
  Filled 2016-03-12 (×2): qty 2

## 2016-03-12 MED ORDER — ENSURE ENLIVE PO LIQD
237.0000 mL | Freq: Two times a day (BID) | ORAL | Status: DC
  Administered 2016-03-12 – 2016-03-15 (×7): 237 mL via ORAL

## 2016-03-12 MED ORDER — SODIUM POLYSTYRENE SULFONATE 15 GM/60ML PO SUSP
30.0000 g | Freq: Once | ORAL | Status: AC
  Administered 2016-03-12: 30 g via ORAL
  Filled 2016-03-12: qty 120

## 2016-03-12 NOTE — Evaluation (Signed)
Physical Therapy Evaluation Patient Details Name: Frank Ashley MRN: [id number] DOB: April 20, 1941 Today's Date: 03/12/2016   History of Present Illness  Frank Ashley is a 75 y.o. male with metastatic renal cell carcinoma being treated at [facility] presents to the ER after patient had a fall. Patient states last night he tripped on a cable and fell. Denies hitting his head or losing consciousness. X-rays reveal left humeral fracture and patient was placed in a sling and immobilizer  Clinical Impression  The patient was very anxious about mobilizing and Required  Much encouragement and extra time.  The patient did successfully get to edge of bed and standing with 2 assist. No family present to discuss DC plan and caregivers availability. Pt admitted with above diagnosis. Pt currently with functional limitations due to the deficits listed below (see PT Problem List).  Pt will benefit from skilled PT to increase their independence and safety with mobility to allow discharge to the venue listed below.     Follow Up Recommendations SNF;Home health PT;Supervision/Assistance - 24 hour    Equipment Recommendations  Cane    Recommendations for Other Services       Precautions / Restrictions Precautions Precautions: Fall Required Braces or Orthoses: Sling Restrictions Weight Bearing Restrictions: No      Mobility  Bed Mobility Overal bed mobility: + 2 for safety/equipment;Needs Assistance Bed Mobility: Supine to Sit     Supine to sit: Max assist;+2 for physical assistance     General bed mobility comments: assist for legs, pt pulled up to sitting holding NT's hand, and utlized pad to help bring hips forward  Transfers Overall transfer level: Needs assistance Equipment used: 2 person hand held assist Transfers: Sit to/from Stand Sit to Stand: Mod assist;+2 physical assistance;From elevated surface         General transfer comment: assist to rise and  stabilize  Ambulation/Gait Ambulation/Gait assistance: Mod assist;+2 physical assistance;+2 safety/equipment   Assistive device: 1 person hand held assist Gait Pattern/deviations: Step-to pattern     General Gait Details: 4 side steps along the bed  Stairs            Wheelchair Mobility    Modified Rankin (Stroke Patients Only)       Balance Overall balance assessment: History of Falls;Needs assistance Sitting-balance support: Feet supported;Single extremity supported   Sitting balance - Comments: strongly held onto right hand to helper   Standing balance support: During functional activity;Single extremity supported Standing balance-Leahy Scale: Poor Standing balance comment: strongly held onto helpers  hand for standing                             Pertinent Vitals/Pain Pain Assessment: 0-10 Pain Score: 9  Pain Location: after repositioning by OT, L arm Pain Descriptors / Indicators: Discomfort;Dull;Grimacing;Guarding;Sharp Pain Intervention(s): Premedicated before session;Repositioned;Ice applied    Home Living Family/patient expects to be discharged to:: Private residence (Simultaneous filing. User may not have seen previous data.) Living Arrangements: Spouse/significant other Available Help at Discharge: Family Type of Home: House Home Access: Stairs to enter Entrance Stairs-Rails: None   Home Layout: Two level;Able to live on main level with bedroom/bathroom Home Equipment: Kasandra Knudsen - single point Additional Comments: children are coming to help as wife is scheduled for TKA this coming Monday    Prior Function Level of Independence: Independent;Independent with assistive device(s)         Comments: uses a cane about 50%  of the time     Hand Dominance   Dominant Hand: Right    Extremity/Trunk Assessment   Upper Extremity Assessment: Defer to OT evaluation           Lower Extremity Assessment: Generalized weakness;LLE  deficits/detail   LLE Deficits / Details: has a dressing on the forefoot  Cervical / Trunk Assessment: Normal  Communication   Communication: No difficulties  Cognition Arousal/Alertness: Awake/alert Behavior During Therapy: Anxious Overall Cognitive Status: No family/caregiver present to determine baseline cognitive functioning Area of Impairment: Attention               General Comments: requires maximum cues and reinforcement    General Comments      Exercises     Assessment/Plan    PT Assessment Patient needs continued PT services  PT Problem List Decreased activity tolerance;Decreased balance;Decreased mobility;Decreased safety awareness;Decreased knowledge of precautions;Pain          PT Treatment Interventions DME instruction;Gait training;Stair training;Functional mobility training;Therapeutic activities;Therapeutic exercise;Patient/family education    PT Goals (Current goals can be found in the Care Plan section)  Acute Rehab PT Goals Patient Stated Goal: decreased pain PT Goal Formulation: With patient Time For Goal Achievement: 03/26/16 Potential to Achieve Goals: Good    Frequency Min 3X/week   Barriers to discharge Decreased caregiver support wife having TKA on Monday, no family present to obtain the info    Co-evaluation PT/OT/SLP Co-Evaluation/Treatment: Yes Reason for Co-Treatment: Complexity of the patient's impairments (multi-system involvement);For patient/therapist safety PT goals addressed during session: Mobility/safety with mobility OT goals addressed during session: ADL's and self-care       End of Session   Activity Tolerance: Patient tolerated treatment well Patient left: in bed;with call bell/phone within reach;with bed alarm set;with nursing/sitter in room Nurse Communication: Mobility status    Functional Assessment Tool Used: clinical  judgement Functional Limitation: Mobility: Walking and moving around Mobility: Walking  and Moving Around Current Status [id number]): At least 60 percent but less than 80 percent impaired, limited or restricted Mobility: Walking and Moving Around Goal Status [phone]): At least 1 percent but less than 20 percent impaired, limited or restricted    Time: 1212-1248 PT Time Calculation (min) (ACUTE ONLY): 36 min   Charges:   PT Evaluation $PT Eval Moderate Complexity: 1 Procedure     PT G Codes:   PT G-Codes **NOT FOR INPATIENT CLASS** Functional Assessment Tool Used: clinical  judgement Functional Limitation: Mobility: Walking and moving around Mobility: Walking and Moving Around Current Status [id number]): At least 60 percent but less than 80 percent impaired, limited or restricted Mobility: Walking and Moving Around Goal Status [phone]): At least 1 percent but less than 20 percent impaired, limited or restricted    Claretha Cooper 03/12/2016, 1:21 PM Tresa Endo PT [phone]

## 2016-03-12 NOTE — Progress Notes (Signed)
Asked to assess for PIV start.   Upon entering room pt requested not to be stuck any more.   "They have stuck me multiple times including with that ultrasound machine and can't get an IV on me"   No IV attempts made per his request.    No PIVs in feet per our policy on pts with diabetes.  Primary RN was made aware of pt's request.

## 2016-03-12 NOTE — Progress Notes (Signed)
Patient with no IV access.  Per Dr. Petra Kuba request and with patient's critically low calcium level as well as need for better pain control with left humerus fracture, IV team paged this morning.  Patient refusing attempts at access/IV team unable to get patient access.  Offered IV team attempt again to patient this evening; he agreed, but IV team unable to start IV.  Patient agreeable to anesthesia department attempting PIV start.  Anesthesia aware and a CRNA will be up to the floor when available.

## 2016-03-12 NOTE — Progress Notes (Signed)
Attempted twice to obtain IV access however unable to thread catheters.   After talking with pt regarding need for PIV due to low calcium he was willing to have anesthesia come up and assess for PIV.    Primary RN made aware of his decision.    Staff will notify anesthesia.

## 2016-03-12 NOTE — Progress Notes (Addendum)
PROGRESS NOTE    STRATON RUMPLE  [id number] DOB: [birth year] DOA: 03/10/2016 PCP: Eulas Post, MD    Brief Narrative:  Frank Ashley is a 75 y.o. male with metastatic renal cell carcinoma being treated at [facility] presents to the ER after patient had a fall, was found to have left humeral fracture, orthopedics consulted , recommended sling , immobilizer, and PT, OT eval AND outpatient follow up . Meanwhile his work up revealed hypocalcemia and hyperkalemia.   Assessment & Plan:   Active Problems:   Hereditary and idiopathic peripheral neuropathy   Sleep apnea   CAD (coronary artery disease)   Hypertension   Type 2 diabetes, controlled, with peripheral neuropathy (HCC)   Metastatic renal cell carcinoma (HCC)   Hypocalcemia   Proximal humerus fracture   Humerus fracture  Left humeral fracture after a mechanical fall.  Orthopedics consulted. On a sling, immobilize.  PT/ot evaluation.  Pain control wit dilaudid and percocet.    Diabetes mellitus: CBG (last 3)   Recent Labs  03/12/16 0036 03/12/16 0822 03/12/16 1245  GLUCAP 164* 163* 112*    Resume lantus and SSI.   Hypocalcemia: IV calcium gluconate ordered but couldn't be given as he doesn t have IV access.  Oral calcium supplements added. Increased it to three times a day.  His corrected calcium level is 6.8, with an albumin level of 1.9.   Hyperkalemia: as per the patient, he has high potassium "all the time". One dose of kayexalate did not work.  EKG ordered.    Will get IV RN to get IV peripheral , and start him on IV calcium gluconate.  Picc Line ordered, but PICC team felt it was not appropriate for one to two time IV infusion.   Acute renal failure : Suspect from dehydration, encouraged oral hydration, improving.    Microcytic anemia:  - hemoglobin at baseline around 9.  - monitor .   Metastatic renal cell carcinoma: further management as per his oncologist .   Hypertension:    Well controlled.   CAD: No chest pain or sob. Resume home meds.      DVT prophylaxis: (Lovenox) Code Status: (Full/) Family Communication: none at bedside.  Disposition Plan: SNF when bed available.    Consultants:   Orthopedics.    Procedures: none   Antimicrobials: none.    Subjective: Pain better than the last two days.   Objective: Vitals:   03/11/16 2000 03/11/16 2056 03/11/16 2124 03/12/16 0419  BP:  129/76 134/66 (!) 146/67  Pulse: 91 85 84 74  Resp: (!) 22 14 16 16   Temp:   98.5 F (36.9 C) 98 F (36.7 C)  TempSrc:   Oral Oral  SpO2: 100% 100% 100% 95%  Weight:      Height:        Intake/Output Summary (Last 24 hours) at 03/12/16 1331 Last data filed at 03/12/16 1131  Gross per 24 hour  Intake                0 ml  Output              975 ml  Net             -975 ml   Filed Weights   03/10/16 2344 03/10/16 2345 03/11/16 0855  Weight: 85.7 kg (189 lb) 85.7 kg (189 lb) 84.7 kg (186 lb 11.7 oz)    Examination:  General exam: Appears anxious Respiratory system: Clear to auscultation. Respiratory  effort normal. Cardiovascular system: S1 & S2 heard, RRR. No JVD, murmurs, rubs, gallops or clicks. No pedal edema. Gastrointestinal system: Abdomen is nondistended, soft and nontender. No organomegaly or masses felt. Normal bowel sounds heard. Central nervous system: Alert and oriented. No focal neurological deficits. Extremities: left shoulder in sling.  Psychiatry: Judgement and insight appear normal. Mood & affect appropriate.     Data Reviewed: I have personally reviewed following labs and imaging studies  CBC:  Recent Labs Lab 03/11/16 0230 03/12/16 0406  WBC 8.4 6.9  NEUTROABS 6.2  --   HGB 9.3* 8.9*  HCT 29.7* 28.1*  MCV 78.4 76.6*  PLT 435* [id number]   Basic Metabolic Panel:  Recent Labs Lab 03/11/16 0230 03/11/16 0943 03/12/16 0406  NA 135 135 136  K 5.6* 5.3* 5.4*  CL 114* 112* 113*  CO2 13* 17* 18*  GLUCOSE 238* 150* 171*   BUN 22* 22* 20  CREATININE 1.51* 1.40* 1.29*  CALCIUM 5.4* 5.4* 5.2*   GFR: Estimated Creatinine Clearance: 56.8 mL/min (by C-G formula based on SCr of 1.29 mg/dL (H)). Liver Function Tests:  Recent Labs Lab 03/12/16 0406  ALBUMIN 1.9*   No results for input(s): LIPASE, AMYLASE in the last 168 hours. No results for input(s): AMMONIA in the last 168 hours. Coagulation Profile: No results for input(s): INR, PROTIME in the last 168 hours. Cardiac Enzymes: No results for input(s): CKTOTAL, CKMB, CKMBINDEX, TROPONINI in the last 168 hours. BNP (last 3 results) No results for input(s): PROBNP in the last 8760 hours. HbA1C: No results for input(s): HGBA1C in the last 72 hours. CBG:  Recent Labs Lab 03/11/16 1253 03/11/16 1727 03/12/16 0036 03/12/16 0822 03/12/16 1245  GLUCAP 73 101* 164* 163* 112*   Lipid Profile: No results for input(s): CHOL, HDL, LDLCALC, TRIG, CHOLHDL, LDLDIRECT in the last 72 hours. Thyroid Function Tests: No results for input(s): TSH, T4TOTAL, FREET4, T3FREE, THYROIDAB in the last 72 hours. Anemia Panel: No results for input(s): VITAMINB12, FOLATE, FERRITIN, TIBC, IRON, RETICCTPCT in the last 72 hours. Sepsis Labs: No results for input(s): PROCALCITON, LATICACIDVEN in the last 168 hours.  No results found for this or any previous visit (from the past 240 hour(s)).       Radiology Studies: Dg Shoulder Left  Result Date: 03/11/2016 CLINICAL DATA:  Trip and fall injury.  Left shoulder deformity. EXAM: LEFT SHOULDER - 2+ VIEW COMPARISON:  None. FINDINGS: Comminuted fractures of the left proximal humerus with transverse impacted fracture across the left humeral surgical neck and longitudinal fractures extending over the base of the greater trochanter. Mild lateral angulation of the distal fracture fragments. Glenohumeral joint appears grossly intact although visualization is limited due to patient positioning. IMPRESSION: Comminuted fractures of the  proximal left humerus. Electronically Signed   By: Lucienne Capers M.D.   On: 03/11/2016 00:41        Scheduled Meds: . amLODipine  10 mg Oral Daily  . aspirin  325 mg Oral Daily  . atorvastatin  20 mg Oral Daily  . calcium-vitamin D  2 tablet Oral Q breakfast  . carvedilol  12.5 mg Oral BID  . enoxaparin (LOVENOX) injection  40 mg Subcutaneous Q24H  . gabapentin  800 mg Oral TID  . insulin aspart  0-9 Units Subcutaneous TID WC  . insulin aspart protamine- aspart  30 Units Subcutaneous BID WC  . pantoprazole  40 mg Oral Daily   Continuous Infusions:   LOS: 0 days    Time spent: 40 minutes.  Hosie Poisson, MD [facility] Pager [phone]  If 7PM-7AM, please contact night-coverage [email or web address] Password [email or web address] 03/12/2016, 1:31 PM

## 2016-03-12 NOTE — Progress Notes (Addendum)
CSW reviewed PT evaluation recommending SNF vs Home Health at discharge. CSW met with patient who informed CSW that his wife is scheduled to have a total knee arthroplasty on Monday here at [facility] and his daughter, Arrie Aran is going to be coming down from [place], [place] to take care of her - therefor, she would be able to take care of him as well. Patient states that he also has 2 sons - Gerald Stabs & Doren Custard that live in [place] and would be able to come and assist as well. RNCM, Juliann Pulse made aware.   *Update: Patient has now reconsidered SNF placement - would like to go to SNF that his sister went to, patient is awaiting call back from his sister to find out which SNF that was. CSW sent information out to [facility] and provided bed offers to patient. Anticipating he will stay in hospital through weekend.   Raynaldo Opitz, [facility] Clinical Social Worker cell #: [phone]

## 2016-03-12 NOTE — NC FL2 (Signed)
[place] LEVEL OF CARE SCREENING TOOL     IDENTIFICATION  Patient Name: Frank Ashley Birthdate: [birth year] Sex: male Admission Date (Current Location): 03/10/2016  [facility] and [place] Number:  Herbalist and Address:  [facility],  [place] [address], [place]      Provider Number: [id number]  Attending Physician Name and Address:  Hosie Poisson, MD  Relative Name and Phone Number:       Current Level of Care: Hospital Recommended Level of Care: [place] Prior Approval Number:    Date Approved/Denied:   PASRR Number: [id number]  Discharge Plan: SNF    Current Diagnoses: Patient Active Problem List   Diagnosis Date Noted  . Hypocalcemia 03/11/2016  . Proximal humerus fracture 03/11/2016  . Humerus fracture 03/11/2016  . Closed fracture of left proximal humerus   . Acquired hammer toe of right foot 03/11/2014  . Ulcer of foot, chronic ([place]) 02/18/2014  . 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 Metastatic renal cell carcinoma ([place]) 11/27/2013  . Obesity (BMI 30-39.9) 05/15/2013  . Anxiety 01/21/2012  . Biliary drain displacement - T-tube replaced 01/01/2012  . Ejection fraction   . CAD (coronary artery disease)   . Hypertension   . Type 2 diabetes, controlled, with peripheral neuropathy ([place])   . Hyperlipidemia   . Right renal mass 09/24/2011  . Bacteremia due to Escherichia coli 09/18/2011  . Ascending cholangitis 09/14/2011  . 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 Overweight(278.02) 10/04/2008  . Sleep apnea 10/04/2008  . Ulcer of heel and midfoot ([name]) 08/01/2008  . HYPERLIPIDEMIA 07/24/2008  . Hereditary and idiopathic peripheral neuropathy 07/24/2008  . Allergic rhinitis, cause unspecified 07/24/2008  . GERD 07/24/2008  . Chronic kidney disease, unspecified 07/24/2008    Orientation RESPIRATION BLADDER Height & Weight     Self, Time, Situation, Place  Normal Continent Weight: 186 lb 11.7  oz (84.7 kg) Height:  6\' 1"  (185.4 cm)  BEHAVIORAL SYMPTOMS/MOOD NEUROLOGICAL BOWEL NUTRITION STATUS      Continent Diet (Heart Healthy/Carb Modified)  AMBULATORY STATUS COMMUNICATION OF NEEDS Skin   Extensive Assist Verbally Normal                       Personal Care Assistance Level of Assistance  Bathing, Dressing Bathing Assistance: Limited assistance   Dressing Assistance: Limited assistance     Functional Limitations Info             SPECIAL CARE FACTORS FREQUENCY  PT (By licensed PT), OT (By licensed OT)     PT Frequency: 5 OT Frequency: 5            Contractures      Additional Factors Info  Code Status, Allergies Code Status Info: Fullcode Allergies Info: Codeine, Hydrocodone, Oxycodone, Sulfa Antibiotics           Current Medications (03/12/2016):  This is the current hospital active medication list Current Facility-Administered Medications  Medication Dose Route Frequency Provider Last Rate Last Dose  . acetaminophen (TYLENOL) tablet 650 mg  650 mg Oral Q6H PRN Rise Patience, MD       Or  . acetaminophen (TYLENOL) suppository 650 mg  650 mg Rectal Q6H PRN Rise Patience, MD      . amLODipine (NORVASC) tablet 10 mg  10 mg Oral Daily Rise Patience, MD   10 mg at 03/12/16 [id number]  . aspirin EC tablet 325 mg  325  mg Oral Daily Rise Patience, MD   325 mg at 03/12/16 [id number]  . atorvastatin (LIPITOR) tablet 20 mg  20 mg Oral Daily Rise Patience, MD   20 mg at 03/12/16 [id number]  . calcium-vitamin D (OSCAL WITH D) 500-200 MG-UNIT per tablet 2 tablet  2 tablet Oral Q breakfast Rise Patience, MD   2 tablet at 03/12/16 [phone]  . carvedilol (COREG) tablet 12.5 mg  12.5 mg Oral BID Rise Patience, MD   12.5 mg at 03/12/16 [id number]  . enoxaparin (LOVENOX) injection 40 mg  40 mg Subcutaneous Q24H Rise Patience, MD   40 mg at 03/12/16 1254  . gabapentin (NEURONTIN) capsule 800 mg  800 mg Oral TID Rise Patience, MD   800 mg at  03/12/16 [id number]  . HYDROmorphone (DILAUDID) tablet 2 mg  2 mg Oral Q2H PRN Hosie Poisson, MD   2 mg at 03/12/16 1254  . insulin aspart (novoLOG) injection 0-9 Units  0-9 Units Subcutaneous TID WC Rise Patience, MD   1 Units at 03/11/16 0800  . insulin aspart protamine- aspart (NOVOLOG MIX 70/30) injection 30 Units  30 Units Subcutaneous BID WC Rise Patience, MD   10 Units at 03/12/16 [phone]  . LORazepam (ATIVAN) tablet 0.5 mg  0.5 mg Oral BID PRN Rise Patience, MD   0.5 mg at 03/12/16 [id number]  . meclizine (ANTIVERT) tablet 25 mg  25 mg Oral Daily PRN Rise Patience, MD      . methocarbamol (ROBAXIN) tablet 500 mg  500 mg Oral Q6H PRN Hosie Poisson, MD      . ondansetron (ZOFRAN) tablet 4 mg  4 mg Oral Q6H PRN Rise Patience, MD       Or  . ondansetron (ZOFRAN) injection 4 mg  4 mg Intravenous Q6H PRN Rise Patience, MD      . oxyCODONE-acetaminophen (PERCOCET/ROXICET) 5-325 MG per tablet 1 tablet  1 tablet Oral Q4H PRN Hosie Poisson, MD   1 tablet at 03/12/16 1120  . pantoprazole (PROTONIX) EC tablet 40 mg  40 mg Oral Daily Rise Patience, MD   40 mg at 03/12/16 [id number]     Discharge Medications: Please see discharge summary for a list of discharge medications.  Relevant Imaging Results:  Relevant Lab Results:   Additional Information SSN: [id number]  Standley Brooking, LCSW

## 2016-03-12 NOTE — Clinical Social Work Placement (Signed)
   CLINICAL SOCIAL WORK PLACEMENT  NOTE  Date:  03/12/2016  Patient Details  Name: Frank Ashley MRN: [id number] Date of Birth: 19-Feb-1941  Clinical Social Work is seeking post-discharge placement for this patient at the [place] level of care (*CSW will initial, date and re-position this form in  chart as items are completed):  Yes   Patient/family provided with [facility] Work Department's list of facilities offering this level of care within the geographic area requested by the patient (or if unable, by the patient's family).  Yes   Patient/family informed of their freedom to choose among providers that offer the needed level of care, that participate in Medicare, Medicaid or managed care program needed by the patient, have an available bed and are willing to accept the patient.  Yes   Patient/family informed of [facility]'s ownership interest in [facility] and [facility], as well as of the fact that they are under no obligation to receive care at these facilities.  PASRR submitted to EDS on       PASRR number received on       Existing PASRR number confirmed on 03/12/16     FL2 transmitted to all facilities in geographic area requested by pt/family on 03/12/16     FL2 transmitted to all facilities within larger geographic area on       Patient informed that his/her managed care company has contracts with or will negotiate with certain facilities, including the following:            Patient/family informed of bed offers received.  Patient chooses bed at       Physician recommends and patient chooses bed at      Patient to be transferred to   on  .  Patient to be transferred to facility by       Patient family notified on   of transfer.  Name of family member notified:        PHYSICIAN       Additional Comment:    _______________________________________________ Standley Brooking, LCSW 03/12/2016, 2:28 PM

## 2016-03-12 NOTE — Evaluation (Addendum)
Occupational Therapy Evaluation Patient Details Name: Frank Ashley MRN: [id number] DOB: [birth year] Today's Date: 03/12/2016    History of Present Illness Frank Ashley is a 75 y.o. male with metastatic renal cell carcinoma being treated at [facility] presents to the ER after patient had a fall. Patient states last night he tripped on a cable and fell. Denies hitting his head or losing consciousness. X-rays reveal left humeral fracture and patient was placed in a sling and immobilizer   Clinical Impression   Pt was admitted for the above.  At baseline, he was mod I to independent with adls.  He is now in a L sling immobilizer and has a lot of pain.  He will benefit from continued OT to continue to educate him and family on minimizing pain with adls and improving his mobility for adls and toilet transfers.    Follow Up Recommendations  SNF;Supervision/Assistance - 24 hour    Equipment Recommendations  3 in 1 bedside comode    Recommendations for Other Services       Precautions / Restrictions Precautions Precautions: Fall Required Braces or Orthoses: Sling Restrictions Weight Bearing Restrictions: No      Mobility Bed Mobility Overal bed mobility: + 2 for safety/equipment;Needs Assistance Bed Mobility: Supine to Sit     Supine to sit: Max assist;+2 for physical assistance     General bed mobility comments: assist for legs, pt pulled up to sitting holding NT's hand, and utlized pad to help bring hips forward  Transfers Overall transfer level: Needs assistance Equipment used: 2 person hand held assist Transfers: Sit to/from Stand Sit to Stand: Mod assist;+2 physical assistance;From elevated surface         General transfer comment: assist to rise and stabilize    Balance Overall balance assessment: History of Falls;Needs assistance Sitting-balance support: Feet supported;Single extremity supported   Sitting balance - Comments: strongly held onto  right hand to helper   Standing balance support: During functional activity;Single extremity supported Standing balance-Leahy Scale: Poor Standing balance comment: strongly held onto helpers  hand for standing                            ADL Overall ADL's : Needs assistance/impaired     Grooming: Minimal assistance;Sitting   Upper Body Bathing: Moderate assistance;Bed level   Lower Body Bathing: Maximal assistance;+2 for safety/equipment;Sit to/from stand   Upper Body Dressing : Maximal assistance;Sitting   Lower Body Dressing: Total assistance;+2 for safety/equipment;Sit to/from stand                 General ADL Comments: educated on ADLs, keeping sling in place. Pt has high pain and will not tolerate taking off for clothing.  Recommended large shirt over sling and demonstrated washing under arm using seesawing method.  Pt was anxious and did not want to move.  NT came and assisted.  Secured waist strap while pt was sitting for increased comfort and placed thin pillow under his arm for comfort.  Educated pt that he might want to sleep in recliner at home. Educated to move fingers and wrist; he cannot tolerate moving elbow.  Educated about ice on and off and thin pillow under upper arm for support.  Pt wears sling positioned below 90; cannot tolerate at 90 degrees.     Vision     Perception     Praxis      Pertinent Vitals/Pain Pain Assessment:  0-10 Pain Score: 9  Pain Location: after repositioning by OT, L arm Pain Descriptors / Indicators: Discomfort;Dull;Grimacing;Guarding;Sharp Pain Intervention(s): Premedicated before session;Repositioned;Ice applied     Hand Dominance Right   Extremity/Trunk Assessment Upper Extremity Assessment Upper Extremity Assessment: LUE immoblized; RUE WFLs   Lower Extremity Assessment Lower Extremity Assessment: Generalized weakness;LLE deficits/detail LLE Deficits / Details: has a dressing on the forefoot   Cervical /  Trunk Assessment Cervical / Trunk Assessment: Normal   Communication Communication Communication: No difficulties   Cognition Arousal/Alertness: Awake/alert Behavior During Therapy: Anxious Overall Cognitive Status: No family/caregiver present to determine baseline cognitive functioning Area of Impairment: anxious and needed a lot of repetition and reinforcement               General Comments: requires maximum cues and reinforcement   General Comments       Exercises       Shoulder Instructions      Home Living Family/patient expects to be discharged to:: Private residence   Living Arrangements: Spouse/significant other Available Help at Discharge: Family Type of Home: House Home Access: Stairs to enter   Entrance Stairs-Rails: None Home Layout: Two level;Able to live on main level with bedroom/bathroom     Bathroom Shower/Tub: Occupational psychologist: Handicapped height     Home Equipment: Kasandra Knudsen - single point   Additional Comments: children are coming to help as wife is scheduled for TKA this coming Monday      Prior Functioning/Environment Level of Independence: Independent;Independent with assistive device(s)        Comments: uses a cane about 50% of the time        OT Problem List: Decreased strength;Decreased activity tolerance;Decreased knowledge of precautions;Pain;Impaired UE functional use   OT Treatment/Interventions: Self-care/ADL training;DME and/or AE instruction;Patient/family education;Therapeutic activities    OT Goals(Current goals can be found in the care plan section) Acute Rehab OT Goals Patient Stated Goal: decreased pain OT Goal Formulation: With patient Time For Goal Achievement: 03/19/16 Potential to Achieve Goals: Good ADL Goals Pt Will Transfer to Toilet: with min assist;ambulating;stand pivot transfer;bedside commode Additional ADL Goal #1: pt/family will verbalize placing shirt over sling, seesawing to wash  under arm and demonstrate adjusting sling for comfort Additional ADL Goal #2: Pt will go from sit to stand with min A and maintain with min guard assistance for 2 minutes for adls  OT Frequency: Min 2X/week   Barriers to D/C:            Co-evaluation PT/OT/SLP Co-Evaluation/Treatment: Yes Reason for Co-Treatment: For patient/therapist safety PT goals addressed during session: Mobility/safety with mobility OT goals addressed during session: ADL's and self-care      End of Session    Activity Tolerance: Patient tolerated treatment well Patient left: in bed;with call bell/phone within reach;with bed alarm set   Time: 1212-1252 OT Time Calculation (min): 40 min Charges:  OT General Charges $OT Visit: 1 Procedure OT Evaluation $OT Eval Low Complexity: 1 Procedure G-Codes: OT G-codes **NOT FOR INPATIENT CLASS** Functional Assessment Tool Used: clinical observation and judgment Functional Limitation: Self care Self Care Current Status [id number]): At least 80 percent but less than 100 percent impaired, limited or restricted Self Care Goal Status [id number]): At least 20 percent but less than 40 percent impaired, limited or restricted  Yigit Norkus 03/12/2016, 1:20 PM  Lesle Chris, OTR/L [phone] 03/12/2016

## 2016-03-12 NOTE — Care Management Note (Signed)
Case Management Note  Patient Details  Name: JAKHI PARA MRN: [id number] Date of Birth: 1941-04-24  Subjective/Objective:  75 y/o m admitted w/hypocalcemia. From home. PT-recc SNF. Patient initially declined SNF, but after being informed of [facility] services(intermittent, out of pocket pay for custodial level help) patient wants SNF-CSW notified.                  Action/Plan:d/c plan SNF.   Expected Discharge Date:   (unknown)               Expected Discharge Plan:  Skilled Nursing Facility  In-House Referral:  Clinical Social Work  Discharge planning Services  CM Consult  Post Acute Care Choice:    Choice offered to:     DME Arranged:    DME Agency:     HH Arranged:    [place] Agency:     Status of Service:  In process, will continue to follow  If discussed at Long Length of Stay Meetings, dates discussed:    Additional Comments:  Dessa Phi, RN 03/12/2016, 2:59 PM

## 2016-03-12 NOTE — Progress Notes (Signed)
Initial Nutrition Assessment  DOCUMENTATION CODES:   Severe malnutrition in context of acute illness/injury  INTERVENTION:  - Will order Ensure Enlive po BID, each supplement provides 350 kcal and 20 grams of protein - Continue to encourage PO intakes of meals and supplements. - Recommend Megace order as pt was taking this PTA with good affect.  - RD will continue to monitor for additional nutrition-related needs.  NUTRITION DIAGNOSIS:   Increased nutrient needs related to catabolic illness, cancer and cancer related treatments as evidenced by estimated needs.  GOAL:   Patient will meet greater than or equal to 90% of their needs  MONITOR:   PO intake, Supplement acceptance, Weight trends, Labs, I & O's  REASON FOR ASSESSMENT:   Malnutrition Screening Tool  ASSESSMENT:   75 y.o. male with metastatic renal cell carcinoma being treated at [facility] presents to the ER after patient had a fall. Patient states last night he tripped on a cable and fell. Denies hitting his head or losing consciousness. X-rays reveal left humeral fracture and patient was placed in a sling and immobilizer. Despite getting multiple doses of pain relief medications patient still has significant pain and has been admitted for pain control and observation. ER physician did discuss with Dr. Ninfa Linden on call orthopedic surgeon who advised follow-up as outpatient.  Pt seen for MST. BMI indicates normal weight/borderline overweight. No intakes documented since admission. Visited pt as he was finishing lunch and he completed ~85% of this meal. Pt states that he has been experiencing nausea, diarrhea, and poor appetite while undergoing chemo treatment. Two to three weeks ago he was prescribed Megace which has greatly improved his appetite and he states he is eating well overall now. Noticed Megace is not ordered for pt at this time and feel it would be beneficial to continue this medication.   He expresses  concern over low serum Ca and requests information about foods high in Ca. Provided pt with two handouts from the Nutrition Care Manual outlining Ca content of foods and listing foods as "high, medium, low, and very low" in Ca content. Pt very appreciative. Also encouraged pt to ensure he consumes a source of protein with each meal and snack as he stated that Oncologist and RD at [facility] have expressed concern over loss of muscle mass while undergoing treatment. Pt began drinking oral nutrition supplements approximately 6 weeks ago and is very interested in continuing this during hospitalization.  Physical assessment shows mild to moderate fat wasting and moderate muscle wasting to upper body; lower body not assessed as pt expresses discomfort in legs/leg positioning during RD visit. Per chart review, pt has lost 10 lbs (5% body weight) in the past <1 month which is significant for time frame.   Medications reviewed; 2 tablets Oscal with D/day, sliding scale Novolog, 30 units Novolog BID, PRN Zofran, 40 mg oral Protonix/day, 15 g Kayexalate x1 dose yesterday.  Labs reviewed; no Mg lab available, CBGs: 163 and 164 mg/dL today, K: 5.4 mmol/L, Cl: 13 mmol/, creatinine: 1.29 mg/dL, Ca: 5.2 mg/dL, GFR: 53 mL/min.    Diet Order:  Diet heart healthy/carb modified Room service appropriate? Yes; Fluid consistency: Thin  Skin:  Reviewed, no issues  Last BM:  10/9  Height:   Ht Readings from Last 1 Encounters:  03/11/16 6\' 1"  (1.854 m)    Weight:   Wt Readings from Last 1 Encounters:  03/11/16 186 lb 11.7 oz (84.7 kg)    Ideal Body Weight:  83.64  kg  BMI:  Body mass index is 24.64 kg/m.  Estimated Nutritional Needs:   Kcal:  [id number] (28-31 kcal/kg)  Protein:  110-127 grams  Fluid:  >/= 2.3 L/day  EDUCATION NEEDS:   Education needs addressed    Jarome Matin, MS, RD, LDN Inpatient Clinical Dietitian Pager # [phone] After hours/weekend pager # [phone]

## 2016-03-13 DIAGNOSIS — K279 Peptic ulcer, site unspecified, unspecified as acute or chronic, without hemorrhage or perforation: Secondary | ICD-10-CM | POA: Diagnosis present

## 2016-03-13 DIAGNOSIS — E860 Dehydration: Secondary | ICD-10-CM | POA: Diagnosis present

## 2016-03-13 DIAGNOSIS — E1122 Type 2 diabetes mellitus with diabetic chronic kidney disease: Secondary | ICD-10-CM | POA: Diagnosis present

## 2016-03-13 DIAGNOSIS — G4733 Obstructive sleep apnea (adult) (pediatric): Secondary | ICD-10-CM | POA: Diagnosis present

## 2016-03-13 DIAGNOSIS — N184 Chronic kidney disease, stage 4 (severe): Secondary | ICD-10-CM | POA: Diagnosis present

## 2016-03-13 DIAGNOSIS — E872 Acidosis: Secondary | ICD-10-CM | POA: Diagnosis present

## 2016-03-13 DIAGNOSIS — I10 Essential (primary) hypertension: Secondary | ICD-10-CM | POA: Diagnosis not present

## 2016-03-13 DIAGNOSIS — F410 Panic disorder [episodic paroxysmal anxiety] without agoraphobia: Secondary | ICD-10-CM | POA: Diagnosis present

## 2016-03-13 DIAGNOSIS — M19042 Primary osteoarthritis, left hand: Secondary | ICD-10-CM | POA: Diagnosis present

## 2016-03-13 DIAGNOSIS — K219 Gastro-esophageal reflux disease without esophagitis: Secondary | ICD-10-CM | POA: Diagnosis present

## 2016-03-13 DIAGNOSIS — N179 Acute kidney failure, unspecified: Secondary | ICD-10-CM | POA: Diagnosis present

## 2016-03-13 DIAGNOSIS — E1142 Type 2 diabetes mellitus with diabetic polyneuropathy: Secondary | ICD-10-CM | POA: Diagnosis present

## 2016-03-13 DIAGNOSIS — L8990 Pressure ulcer of unspecified site, unspecified stage: Secondary | ICD-10-CM | POA: Insufficient documentation

## 2016-03-13 DIAGNOSIS — L89899 Pressure ulcer of other site, unspecified stage: Secondary | ICD-10-CM | POA: Diagnosis not present

## 2016-03-13 DIAGNOSIS — I2510 Atherosclerotic heart disease of native coronary artery without angina pectoris: Secondary | ICD-10-CM | POA: Diagnosis present

## 2016-03-13 DIAGNOSIS — N400 Enlarged prostate without lower urinary tract symptoms: Secondary | ICD-10-CM | POA: Diagnosis present

## 2016-03-13 DIAGNOSIS — S42212D Unspecified displaced fracture of surgical neck of left humerus, subsequent encounter for fracture with routine healing: Secondary | ICD-10-CM | POA: Diagnosis not present

## 2016-03-13 DIAGNOSIS — L97529 Non-pressure chronic ulcer of other part of left foot with unspecified severity: Secondary | ICD-10-CM | POA: Diagnosis present

## 2016-03-13 DIAGNOSIS — G629 Polyneuropathy, unspecified: Secondary | ICD-10-CM | POA: Diagnosis present

## 2016-03-13 DIAGNOSIS — E875 Hyperkalemia: Secondary | ICD-10-CM | POA: Diagnosis present

## 2016-03-13 DIAGNOSIS — C649 Malignant neoplasm of unspecified kidney, except renal pelvis: Secondary | ICD-10-CM | POA: Diagnosis present

## 2016-03-13 DIAGNOSIS — G609 Hereditary and idiopathic neuropathy, unspecified: Secondary | ICD-10-CM | POA: Diagnosis present

## 2016-03-13 DIAGNOSIS — W010XXA Fall on same level from slipping, tripping and stumbling without subsequent striking against object, initial encounter: Secondary | ICD-10-CM | POA: Diagnosis present

## 2016-03-13 DIAGNOSIS — S42212A Unspecified displaced fracture of surgical neck of left humerus, initial encounter for closed fracture: Secondary | ICD-10-CM | POA: Diagnosis present

## 2016-03-13 DIAGNOSIS — I129 Hypertensive chronic kidney disease with stage 1 through stage 4 chronic kidney disease, or unspecified chronic kidney disease: Secondary | ICD-10-CM | POA: Diagnosis present

## 2016-03-13 DIAGNOSIS — E11621 Type 2 diabetes mellitus with foot ulcer: Secondary | ICD-10-CM | POA: Diagnosis present

## 2016-03-13 DIAGNOSIS — E43 Unspecified severe protein-calorie malnutrition: Secondary | ICD-10-CM | POA: Diagnosis present

## 2016-03-13 LAB — BASIC METABOLIC PANEL
Anion gap: 6 (ref 5–15)
BUN: 23 mg/dL — AB (ref 6–20)
CALCIUM: 5.2 mg/dL — AB (ref 8.9–10.3)
CO2: 18 mmol/L — ABNORMAL LOW (ref 22–32)
Chloride: 111 mmol/L (ref 101–111)
Creatinine, Ser: 1.24 mg/dL (ref 0.61–1.24)
GFR calc Af Amer: >60 mL/min (ref >60)
GFR, EST NON AFRICAN AMERICAN: 56 mL/min — AB (ref >60)
Glucose, Bld: 191 mg/dL — ABNORMAL HIGH (ref 65–99)
POTASSIUM: 4.8 mmol/L (ref 3.5–5.1)
SODIUM: 135 mmol/L (ref 135–145)

## 2016-03-13 LAB — GLUCOSE, CAPILLARY
GLUCOSE-CAPILLARY: 171 mg/dL — AB (ref 65–99)
GLUCOSE-CAPILLARY: 150 mg/dL — AB (ref 65–99)
GLUCOSE-CAPILLARY: 180 mg/dL — AB (ref 65–99)
Glucose-Capillary: 159 mg/dL — ABNORMAL HIGH (ref 65–99)

## 2016-03-13 LAB — TSH: TSH: 2.872 u[IU]/mL (ref 0.350–4.500)

## 2016-03-13 MED ORDER — CALCIUM GLUCONATE 10 % IV SOLN
2.0000 g | Freq: Once | INTRAVENOUS | Status: AC
Start: 2016-03-13 — End: 2016-03-13
  Administered 2016-03-13: 2 g via INTRAVENOUS
  Filled 2016-03-13: qty 20

## 2016-03-13 MED ORDER — HYDROMORPHONE HCL 1 MG/ML IJ SOLN
1.0000 mg | INTRAMUSCULAR | Status: DC | PRN
  Administered 2016-03-13 – 2016-03-15 (×7): 1 mg via INTRAVENOUS
  Filled 2016-03-13 (×7): qty 1

## 2016-03-13 NOTE — Care Management Obs Status (Signed)
[place] NOTIFICATION   Patient Details  Name: Frank Ashley MRN: [id number] Date of Birth: August 11, 1940   Medicare Observation Status Notification Given:  Yes  Explained Notification, pt requested son review. Declined to sign.   Erenest Rasher, RN 03/13/2016, 3:23 PM

## 2016-03-13 NOTE — Progress Notes (Signed)
CRITICAL VALUE ALERT  Critical value received:  Calcium 5.2  Date of notification:  03/13/2016  Time of notification:  1000  Critical value read back: yes  Nurse who received alert:  Felicity Coyer, RN  MD notified (1st page):  Karleen Hampshire  Time of first page:  1002  MD notified (2nd page):  Time of second page:  Responding MD:    Time MD responded:

## 2016-03-13 NOTE — Progress Notes (Signed)
Spoke with Joellen Jersey RN regarding PICC line placement.  Pt still hesitant, RN states she thinks the MD is planning on cancelling PICC line at this time.

## 2016-03-13 NOTE — Clinical Social Work Note (Signed)
MSW met with patient to follow up on bed offers. Patient stated that he has not made a decision at this time and would like to include his family in decision making. Patient stated that he plans to discuss SNF placement and available options with his wife once she arrives to visit today. MSW offered to contact family and patient declined.   MSW encouraged patient to contact MSW once wife arrives, contact information provided.  MSW remains available as needed.   Glendon Axe, MSW [phone] 03/13/2016 11:52 AM

## 2016-03-13 NOTE — Progress Notes (Signed)
Critical calcium of 5.2.  Pt has no IV access.  IV team paged and anesthesia called to try to get IV placement.  MD made aware- will call IR for IJ placement.  Will continue to monitor closely.

## 2016-03-13 NOTE — Progress Notes (Signed)
PROGRESS NOTE    Frank Ashley  [id number] DOB: [birth year] DOA: 03/10/2016 PCP: Eulas Post, MD    Brief Narrative:  Frank Ashley is a 75 y.o. male with metastatic renal cell carcinoma being treated at [facility] presents to the ER after patient had a fall, was found to have left humeral fracture, orthopedics consulted , recommended sling , immobilizer, and PT, OT eval AND outpatient follow up . Meanwhile his work up revealed hypocalcemia and hyperkalemia.   Assessment & Plan:   Active Problems:   Hereditary and idiopathic peripheral neuropathy   Sleep apnea   CAD (coronary artery disease)   Hypertension   Type 2 diabetes, controlled, with peripheral neuropathy (HCC)   Metastatic renal cell carcinoma (HCC)   Hypocalcemia   Proximal humerus fracture   Humerus fracture   Protein-calorie malnutrition, severe   Pressure injury of skin  Left humeral fracture after a mechanical fall.  Orthopedics consulted. On a sling, immobilize.  PT/ot evaluation.  Pain control wit dilaudid and percocet.    Diabetes mellitus: CBG (last 3)   Recent Labs  03/12/16 2134 03/13/16 0804 03/13/16 1151  GLUCAP 222* 171* 150*    Resume lantus and SSI.   Hypocalcemia: IV calcium gluconate ordered but couldn't be given as he doesn t have IV access.  Oral calcium supplements added. Increased it to three times a day.  His corrected calcium level is 6.8, with an albumin level of 1.9.  Ionized calcium, PTH, tsh and magnesium ordered.  2 g of IV calcium gluconate ordered.   Hyperkalemia: as per the patient, he has high potassium "all the time". One dose of kayexalate did not work. Repeat kayexalate yesterday and repeat level is normal.  EKG ordered showed low voltage leads, and normal sinus rhythm, .      Acute renal failure : Suspect from dehydration, encouraged oral hydration, improving.    Microcytic anemia:  - hemoglobin at baseline around 9.  - monitor .    Metastatic renal cell carcinoma: further management as per his oncologist .   Hypertension:  Well controlled.   CAD: No chest pain or sob. Resume home meds.      DVT prophylaxis: (Lovenox) Code Status: (Full/) Family Communication: discussed the plan of care with son and wife  at bedside.  Disposition Plan: SNF when bed available.    Consultants:   Orthopedics.    Procedures: none   Antimicrobials: none.    Subjective: Pain better than the last two days.   Objective: Vitals:   03/12/16 1230 03/12/16 1955 03/13/16 0550 03/13/16 1300  BP: 135/71 (!) 143/64 132/68 129/70  Pulse: 70 83 83 79  Resp: 18 18 18 18   Temp: 98.1 F (36.7 C) 98.4 F (36.9 C) 98.3 F (36.8 C) 98.2 F (36.8 C)  TempSrc: Oral Oral Oral Oral  SpO2: 96% 100% 100% 100%  Weight:      Height:        Intake/Output Summary (Last 24 hours) at 03/13/16 1538 Last data filed at 03/13/16 0900  Gross per 24 hour  Intake              900 ml  Output              750 ml  Net              150 ml   Filed Weights   03/10/16 2344 03/10/16 2345 03/11/16 0855  Weight: 85.7 kg (189 lb) 85.7 kg (189 lb)  84.7 kg (186 lb 11.7 oz)    Examination:  General exam: Appears anxious Respiratory system: Clear to auscultation. Respiratory effort normal. Cardiovascular system: S1 & S2 heard, RRR. No JVD, murmurs, rubs, gallops or clicks. No pedal edema. Gastrointestinal system: Abdomen is nondistended, soft and nontender. No organomegaly or masses felt. Normal bowel sounds heard. Central nervous system: Alert and oriented. No focal neurological deficits. Extremities: left shoulder in sling.  Psychiatry: Judgement and insight appear normal. Mood & affect appropriate.     Data Reviewed: I have personally reviewed following labs and imaging studies  CBC:  Recent Labs Lab 03/11/16 0230 03/12/16 0406  WBC 8.4 6.9  NEUTROABS 6.2  --   HGB 9.3* 8.9*  HCT 29.7* 28.1*  MCV 78.4 76.6*  PLT 435* [id number]    Basic Metabolic Panel:  Recent Labs Lab 03/11/16 0230 03/11/16 0943 03/12/16 0406 03/13/16 0841  NA 135 135 136 135  K 5.6* 5.3* 5.4* 4.8  CL 114* 112* 113* 111  CO2 13* 17* 18* 18*  GLUCOSE 238* 150* 171* 191*  BUN 22* 22* 20 23*  CREATININE 1.51* 1.40* 1.29* 1.24  CALCIUM 5.4* 5.4* 5.2* 5.2*   GFR: Estimated Creatinine Clearance: 59.1 mL/min (by C-G formula based on SCr of 1.24 mg/dL). Liver Function Tests:  Recent Labs Lab 03/12/16 0406  ALBUMIN 1.9*   No results for input(s): LIPASE, AMYLASE in the last 168 hours. No results for input(s): AMMONIA in the last 168 hours. Coagulation Profile: No results for input(s): INR, PROTIME in the last 168 hours. Cardiac Enzymes: No results for input(s): CKTOTAL, CKMB, CKMBINDEX, TROPONINI in the last 168 hours. BNP (last 3 results) No results for input(s): PROBNP in the last 8760 hours. HbA1C: No results for input(s): HGBA1C in the last 72 hours. CBG:  Recent Labs Lab 03/12/16 1245 03/12/16 1712 03/12/16 2134 03/13/16 0804 03/13/16 1151  GLUCAP 112* 148* 222* 171* 150*   Lipid Profile: No results for input(s): CHOL, HDL, LDLCALC, TRIG, CHOLHDL, LDLDIRECT in the last 72 hours. Thyroid Function Tests: No results for input(s): TSH, T4TOTAL, FREET4, T3FREE, THYROIDAB in the last 72 hours. Anemia Panel: No results for input(s): VITAMINB12, FOLATE, FERRITIN, TIBC, IRON, RETICCTPCT in the last 72 hours. Sepsis Labs: No results for input(s): PROCALCITON, LATICACIDVEN in the last 168 hours.  No results found for this or any previous visit (from the past 240 hour(s)).       Radiology Studies: No results found.      Scheduled Meds: . amLODipine  10 mg Oral Daily  . aspirin  325 mg Oral Daily  . atorvastatin  20 mg Oral Daily  . calcium-vitamin D  2 tablet Oral TID  . carvedilol  12.5 mg Oral BID  . enoxaparin (LOVENOX) injection  40 mg Subcutaneous Q24H  . feeding supplement (ENSURE ENLIVE)  237 mL Oral BID  BM  . gabapentin  800 mg Oral TID  . insulin aspart  0-9 Units Subcutaneous TID WC  . insulin aspart protamine- aspart  30 Units Subcutaneous BID WC  . pantoprazole  40 mg Oral Daily   Continuous Infusions:   LOS: 0 days    Time spent: 40 minutes.     Hosie Poisson, MD [facility] Pager [phone]  If 7PM-7AM, please contact night-coverage [email or web address] Password [facility] 03/13/2016, 3:38 PM

## 2016-03-13 NOTE — Progress Notes (Signed)
Physical Therapy Treatment Patient Details Name: Frank Ashley MRN: [id number] DOB: 1940/08/23 Today's Date: 03/13/2016    History of Present Illness GHASSAN NILE is a 75 y.o. male with metastatic renal cell carcinoma being treated at [facility] presents to the ER after patient had a fall. Patient states last night he tripped on a cable and fell. Denies hitting his head or losing consciousness. X-rays reveal left humeral fracture and patient was placed in a sling and immobilizer    PT Comments    The patient is mobilizing  Better, still requires 2 assist. Patient's wife present and states that she prefers DC to home. She is scheduled for TKA on 11/13. Informed wife that patient is very weak  And most likely will require  2 assist for mobility at this time. The patient  Did ambulate to bathroom, became very weak and  Needed  More assistance to walk 10' back to bed. Continue PT while in acute.  Follow Up Recommendations  SNF;Home health PT;Supervision/Assistance - 24 hour, Aide if DC home.      Engineer, technical sales    Recommendations for Other Services       Precautions / Restrictions Precautions Precautions: Fall Precaution Comments: painful left shoulder Required Braces or Orthoses: Sling Restrictions LUE Weight Bearing: Non weight bearing    Mobility  Bed Mobility Overal bed mobility: + 2 for safety/equipment;Needs Assistance Bed Mobility: Supine to Sit;Sit to Supine     Supine to sit: Max assist;+2 for physical assistance;+2 for safety/equipment Sit to supine: Max assist;+2 for physical assistance;+2 for safety/equipment   General bed mobility comments: assist for legs, pt pulled up to sitting holding NT's hand, assist with trunk to return to supine, pillow against L scapula for support.  Transfers Overall transfer level: Needs assistance Equipment used: 1 person hand held assist Transfers: Sit to/from Stand Sit to Stand: Mod assist;+2 physical  assistance;From elevated surface         General transfer comment: assist to rise and stabilize  Ambulation/Gait Ambulation/Gait assistance: Mod assist;+2 physical assistance;+2 safety/equipment Ambulation Distance (Feet): 15 Feet (then 10') Assistive device: 1 person hand held assist Gait Pattern/deviations: Step-to pattern;Shuffle     General Gait Details: strong  support  on 1 helper, guarding by second helper. Bed brought closer to the bathroom due to patient feeling very weak after having a BM.   Stairs            Wheelchair Mobility    Modified Rankin (Stroke Patients Only)       Balance       Sitting balance - Comments: strongly held onto right hand to helper   Standing balance support: During functional activity;Single extremity supported Standing balance-Leahy Scale: Poor                      Cognition Arousal/Alertness: Awake/alert Behavior During Therapy: Anxious                        Exercises      General Comments        Pertinent Vitals/Pain Faces Pain Scale: Hurts whole lot Pain Location: L shoulder Pain Descriptors / Indicators: Discomfort;Grimacing;Guarding;Moaning Pain Intervention(s): Monitored during session;Premedicated before session;Repositioned;Ice applied    Home Living                      Prior Function  PT Goals (current goals can now be found in the care plan section) Progress towards PT goals: Progressing toward goals    Frequency    Min 3X/week      PT Plan Current plan remains appropriate    Co-evaluation             End of Session Equipment Utilized During Treatment: Gait belt Activity Tolerance: Patient tolerated treatment well Patient left: in bed;with call bell/phone within reach;with nursing/sitter in room     Time: 1530-1620 PT Time Calculation (min) (ACUTE ONLY): 50 min  Charges:  $Gait Training: 8-22 mins $Self Care/Home Management: 23-37                     G Codes:      Claretha Cooper 03/13/2016, 4:47 PM

## 2016-03-14 LAB — COMPREHENSIVE METABOLIC PANEL
ALK PHOS: 179 U/L — AB (ref 38–126)
ALT: 18 U/L (ref 17–63)
AST: 23 U/L (ref 15–41)
Albumin: 1.7 g/dL — ABNORMAL LOW (ref 3.5–5.0)
Anion gap: 4 — ABNORMAL LOW (ref 5–15)
BUN: 26 mg/dL — AB (ref 6–20)
CALCIUM: 5.7 mg/dL — AB (ref 8.9–10.3)
CO2: 18 mmol/L — AB (ref 22–32)
CREATININE: 1.3 mg/dL — AB (ref 0.61–1.24)
Chloride: 112 mmol/L — ABNORMAL HIGH (ref 101–111)
GFR calc non Af Amer: 52 mL/min — ABNORMAL LOW (ref >60)
Glucose, Bld: 215 mg/dL — ABNORMAL HIGH (ref 65–99)
Potassium: 4.7 mmol/L (ref 3.5–5.1)
SODIUM: 134 mmol/L — AB (ref 135–145)
Total Bilirubin: 0.4 mg/dL (ref 0.3–1.2)
Total Protein: 5.9 g/dL — ABNORMAL LOW (ref 6.5–8.1)

## 2016-03-14 LAB — GLUCOSE, CAPILLARY
GLUCOSE-CAPILLARY: 125 mg/dL — AB (ref 65–99)
GLUCOSE-CAPILLARY: 71 mg/dL (ref 65–99)
Glucose-Capillary: 198 mg/dL — ABNORMAL HIGH (ref 65–99)
Glucose-Capillary: 214 mg/dL — ABNORMAL HIGH (ref 65–99)
Glucose-Capillary: 126 mg/dL — ABNORMAL HIGH (ref 65–99)

## 2016-03-14 MED ORDER — SODIUM CHLORIDE 0.9 % IV SOLN
1.0000 g | Freq: Once | INTRAVENOUS | Status: AC
  Administered 2016-03-14: 1 g via INTRAVENOUS
  Filled 2016-03-14: qty 10

## 2016-03-14 MED ORDER — ALUM & MAG HYDROXIDE-SIMETH 200-200-20 MG/5ML PO SUSP
30.0000 mL | ORAL | Status: DC | PRN
  Administered 2016-03-14: 30 mL via ORAL
  Filled 2016-03-14: qty 30

## 2016-03-14 MED ORDER — INSULIN ASPART PROT & ASPART (70-30 MIX) 100 UNIT/ML ~~LOC~~ SUSP
10.0000 [IU] | Freq: Two times a day (BID) | SUBCUTANEOUS | Status: DC
  Administered 2016-03-14 – 2016-03-15 (×2): 10 [IU] via SUBCUTANEOUS

## 2016-03-14 NOTE — Progress Notes (Signed)
PROGRESS NOTE    Frank DROGE  [id number] DOB: [birth year] DOA: 03/10/2016 PCP: Eulas Post, MD    Brief Narrative:  Frank Ashley is a 75 y.o. male with metastatic renal cell carcinoma being treated at [facility] presents to the ER after patient had a fall, was found to have left humeral fracture, orthopedics consulted , recommended sling , immobilizer, and PT, OT eval AND outpatient follow up . Meanwhile his work up revealed hypocalcemia and hyperkalemia.   Assessment & Plan:   Active Problems:   Hereditary and idiopathic peripheral neuropathy   Sleep apnea   CAD (coronary artery disease)   Hypertension   Type 2 diabetes, controlled, with peripheral neuropathy (HCC)   Metastatic renal cell carcinoma (HCC)   Hypocalcemia   Proximal humerus fracture   Humerus fracture   Protein-calorie malnutrition, severe   Pressure injury of skin  Left humeral fracture after a mechanical fall.  Orthopedics consulted. On a sling, immobilize.  PT/ot evaluation recommending SNF.  Pain control with dilaudid and percocet.    Diabetes mellitus: CBG (last 3)   Recent Labs  03/14/16 0746 03/14/16 0843 03/14/16 1201  GLUCAP 198* 214* 126*    Resume lantus and SSI.   Hypocalcemia: IV calcium gluconate ordered on admission but couldn't be given as he doesn t have IV access.  Oral calcium supplements added. Increased it to three times a day.  His corrected calcium level is >7.5, with an albumin level of 1.7.   He received 2 g of IV calcium gluconate ordered.   Hyperkalemia: as per the patient, he has high potassium "all the time". One dose of kayexalate did not work. Repeat kayexalate  and repeat level is normal.  EKG ordered showed low voltage leads, and normal sinus rhythm, .      Acute renal failure : Suspect from dehydration, encouraged oral hydration, improving.    Microcytic anemia:  - hemoglobin at baseline around 9.  - monitor .   Metastatic renal cell  carcinoma: further management as per his oncologist .   Hypertension:  Well controlled.   CAD: No chest pain or sob. Resume home meds.      DVT prophylaxis: (Lovenox) Code Status: (Full/) Family Communication: discussed the plan of care with son and wife  at bedside.  Disposition Plan: SNF when bed available. Possibly tomorrow.    Consultants:   Orthopedics.    Procedures: none   Antimicrobials: none.    Subjective: Pain better than the last two days.  Slightly nauseated.   Objective: Vitals:   03/13/16 2135 03/14/16 0555 03/14/16 0846 03/14/16 1320  BP: 140/69 132/64 136/67 132/65  Pulse: 94 93 92 88  Resp: 16 20  18   Temp: 98.3 F (36.8 C) 98.2 F (36.8 C)  98.2 F (36.8 C)  TempSrc: Oral Oral  Oral  SpO2: 100% 100%  100%  Weight:      Height:        Intake/Output Summary (Last 24 hours) at 03/14/16 1653 Last data filed at 03/14/16 1200  Gross per 24 hour  Intake              560 ml  Output              850 ml  Net             -290 ml   Filed Weights   03/10/16 2344 03/10/16 2345 03/11/16 0855  Weight: 85.7 kg (189 lb) 85.7 kg (189 lb) 84.7  kg (186 lb 11.7 oz)    Examination:  General exam: Appears anxious, ;but comfortable.  Respiratory system: Clear to auscultation. Respiratory effort normal. Cardiovascular system: S1 & S2 heard, RRR. No JVD, murmurs, rubs, gallops or clicks.  Gastrointestinal system: Abdomen is nondistended, soft and nontender. No organomegaly or masses felt. Normal bowel sounds heard. Central nervous system: Alert and oriented. No focal neurological deficits. Extremities: left shoulder in sling.  Psychiatry: Judgement and insight appear normal. Mood & affect appropriate.     Data Reviewed: I have personally reviewed following labs and imaging studies  CBC:  Recent Labs Lab 03/11/16 0230 03/12/16 0406  WBC 8.4 6.9  NEUTROABS 6.2  --   HGB 9.3* 8.9*  HCT 29.7* 28.1*  MCV 78.4 76.6*  PLT 435* 123456   Basic  Metabolic Panel:  Recent Labs Lab 03/11/16 0230 03/11/16 0943 03/12/16 0406 03/13/16 0841 03/14/16 0505  NA 135 135 136 135 134*  K 5.6* 5.3* 5.4* 4.8 4.7  CL 114* 112* 113* 111 112*  CO2 13* 17* 18* 18* 18*  GLUCOSE 238* 150* 171* 191* 215*  BUN 22* 22* 20 23* 26*  CREATININE 1.51* 1.40* 1.29* 1.24 1.30*  CALCIUM 5.4* 5.4* 5.2* 5.2* 5.7*   GFR: Estimated Creatinine Clearance: 56.3 mL/min (by C-G formula based on SCr of 1.3 mg/dL (H)). Liver Function Tests:  Recent Labs Lab 03/12/16 0406 03/14/16 0505  AST  --  23  ALT  --  18  ALKPHOS  --  179*  BILITOT  --  0.4  PROT  --  5.9*  ALBUMIN 1.9* 1.7*   No results for input(s): LIPASE, AMYLASE in the last 168 hours. No results for input(s): AMMONIA in the last 168 hours. Coagulation Profile: No results for input(s): INR, PROTIME in the last 168 hours. Cardiac Enzymes: No results for input(s): CKTOTAL, CKMB, CKMBINDEX, TROPONINI in the last 168 hours. BNP (last 3 results) No results for input(s): PROBNP in the last 8760 hours. HbA1C: No results for input(s): HGBA1C in the last 72 hours. CBG:  Recent Labs Lab 03/13/16 1652 03/13/16 2147 03/14/16 0746 03/14/16 0843 03/14/16 1201  GLUCAP 159* 180* 198* 214* 126*   Lipid Profile: No results for input(s): CHOL, HDL, LDLCALC, TRIG, CHOLHDL, LDLDIRECT in the last 72 hours. Thyroid Function Tests:  Recent Labs  03/13/16 2010  TSH 2.872   Anemia Panel: No results for input(s): VITAMINB12, FOLATE, FERRITIN, TIBC, IRON, RETICCTPCT in the last 72 hours. Sepsis Labs: No results for input(s): PROCALCITON, LATICACIDVEN in the last 168 hours.  No results found for this or any previous visit (from the past 240 hour(s)).       Radiology Studies: No results found.      Scheduled Meds: . amLODipine  10 mg Oral Daily  . aspirin  325 mg Oral Daily  . atorvastatin  20 mg Oral Daily  . calcium-vitamin D  2 tablet Oral TID  . carvedilol  12.5 mg Oral BID  .  enoxaparin (LOVENOX) injection  40 mg Subcutaneous Q24H  . feeding supplement (ENSURE ENLIVE)  237 mL Oral BID BM  . gabapentin  800 mg Oral TID  . insulin aspart  0-9 Units Subcutaneous TID WC  . insulin aspart protamine- aspart  10 Units Subcutaneous BID WC  . pantoprazole  40 mg Oral Daily   Continuous Infusions:   LOS: 1 day    Time spent: 30 minutes.     Hosie Poisson, MD [facility] Pager [phone]  If 7PM-7AM, please contact night-coverage  [email or web address] Password [email or web address] 03/14/2016, 4:53 PM

## 2016-03-14 NOTE — Progress Notes (Signed)
CRITICAL VALUE ALERT  Critical value received:  Calcium 5.7  Date of notification:  03/14/16  Time of notification:  1227  Critical value read back:Yes.    Nurse who received alert:  Bobbye Riggs, RN    MD notified (1st page):  Hosie Poisson, MD   Time of first page:  MD notified at bedside

## 2016-03-14 NOTE — Consult Note (Signed)
[facility] Nurse wound consult note Reason for Consult: DFU left plantar aspect Wound type:neuropathic Pressure Ulcer POA: No Measurement:Recently reepithelialized (healed) ulcer measuring 1cm x 0.6cm  Wound [id number] Drainage (amount, consistency, odor) None Periwound:intact, dry Dressing procedure/placement/frequency:) Patient has recently been told by physician that recently healed ulcer required hydration and that a Vaseline gauze is appropriate for a few more weeks.  I do not disagree in the hydration and protection of recently healed wounds, particularly on dry areas requiring protection such as the feet.  I have provided Nursing with that guidance via the Orders for 14 days. [facility] nursing team will not follow, but will remain available to this patient, the nursing and medical teams.  Please re-consult if needed. Thanks, Maudie Flakes, MSN, RN, [place], Arther Abbott  Pager# [phone]

## 2016-03-15 DIAGNOSIS — L89899 Pressure ulcer of other site, unspecified stage: Secondary | ICD-10-CM

## 2016-03-15 LAB — COMPREHENSIVE METABOLIC PANEL
ALBUMIN: 1.9 g/dL — AB (ref 3.5–5.0)
ALK PHOS: 238 U/L — AB (ref 38–126)
ALT: 22 U/L (ref 17–63)
AST: 28 U/L (ref 15–41)
Anion gap: 8 (ref 5–15)
BUN: 29 mg/dL — ABNORMAL HIGH (ref 6–20)
CALCIUM: 6.3 mg/dL — AB (ref 8.9–10.3)
CO2: 18 mmol/L — AB (ref 22–32)
CREATININE: 1.09 mg/dL (ref 0.61–1.24)
Chloride: 110 mmol/L (ref 101–111)
GFR calc Af Amer: >60 mL/min (ref >60)
GFR calc non Af Amer: >60 mL/min (ref >60)
GLUCOSE: 110 mg/dL — AB (ref 65–99)
Potassium: 4.7 mmol/L (ref 3.5–5.1)
SODIUM: 136 mmol/L (ref 135–145)
Total Bilirubin: 0.4 mg/dL (ref 0.3–1.2)
Total Protein: 6.1 g/dL — ABNORMAL LOW (ref 6.5–8.1)

## 2016-03-15 LAB — GLUCOSE, CAPILLARY
Glucose-Capillary: 157 mg/dL — ABNORMAL HIGH (ref 65–99)
Glucose-Capillary: 108 mg/dL — ABNORMAL HIGH (ref 65–99)

## 2016-03-15 LAB — CALCIUM, IONIZED: CALCIUM, IONIZED, SERUM: 3.6 mg/dL — AB (ref 4.5–5.6)

## 2016-03-15 LAB — PARATHYROID HORMONE, INTACT (NO CA): PTH: 193 pg/mL — ABNORMAL HIGH (ref 15–65)

## 2016-03-15 MED ORDER — MEGESTROL ACETATE 400 MG/10ML PO SUSP
400.0000 mg | Freq: Every day | ORAL | Status: DC
Start: 2016-03-15 — End: 2016-03-15
  Administered 2016-03-15: 400 mg via ORAL
  Filled 2016-03-15: qty 10

## 2016-03-15 MED ORDER — HYDROMORPHONE HCL 2 MG PO TABS: 2.0000 mg | ORAL_TABLET | ORAL | 0 refills | Status: AC | PRN

## 2016-03-15 MED ORDER — TRAMADOL HCL 50 MG PO TABS: 50.0000 mg | ORAL_TABLET | Freq: Four times a day (QID) | ORAL | 0 refills | Status: AC | PRN

## 2016-03-15 MED ORDER — INSULIN ASPART PROT & ASPART (70-30 MIX) 100 UNIT/ML ~~LOC~~ SUSP: 10.0000 [IU] | Freq: Two times a day (BID) | SUBCUTANEOUS | 11 refills | Status: AC

## 2016-03-15 MED ORDER — LORAZEPAM 0.5 MG PO TABS: 0.5000 mg | ORAL_TABLET | Freq: Four times a day (QID) | ORAL | 0 refills | Status: AC | PRN

## 2016-03-15 MED ORDER — MEGESTROL ACETATE 400 MG/10ML PO SUSP: 400.0000 mg | Freq: Every day | ORAL | 0 refills | Status: AC

## 2016-03-15 MED ORDER — ENSURE ENLIVE PO LIQD: 237.0000 mL | Freq: Two times a day (BID) | ORAL | 12 refills | Status: AC

## 2016-03-15 MED ORDER — CALCIUM CARBONATE-VITAMIN D 500-200 MG-UNIT PO TABS: 2.0000 | ORAL_TABLET | Freq: Two times a day (BID) | ORAL | 0 refills | Status: AC

## 2016-03-15 NOTE — Progress Notes (Signed)
Occupational Therapy Treatment Patient Details Name: Frank Ashley MRN: [id number] DOB: [birth year] Today's Date: 03/15/2016    History of present illness Frank Ashley is a 75 y.o. male with metastatic renal cell carcinoma being treated at [facility] presents to the ER after patient had a fall. Patient states last night he tripped on a cable and fell. Denies hitting his head or losing consciousness. X-rays reveal left humeral fracture and patient was placed in a sling and immobilizer   OT comments  Limited ability to tolerate OOB this day due to L shoulder pain- nursing student aware  Follow Up Recommendations  SNF;Supervision/Assistance - 24 hour    Equipment Recommendations  3 in 1 bedside comode    Recommendations for Other Services      Precautions / Restrictions Precautions Precautions: Fall Precaution Comments: painful left shoulder Required Braces or Orthoses: Sling Restrictions LUE Weight Bearing: Non weight bearing       Mobility Bed Mobility               General bed mobility comments: unable due to pain  Transfers                 General transfer comment: not observed    Balance                                   ADL Overall ADL's : Needs assistance/impaired                                       General ADL Comments: Pt iniitally agreed to OOB but pain too much upon attempting to sit up. RN called for pain meds. Pt agreed to elbow , wrist and finger ROM in bed.  Pain limiiting this activity as well      Vision                     Perception     Praxis      Cognition   Behavior During Therapy: WFL for tasks assessed/performed Overall Cognitive Status: No family/caregiver present to determine baseline cognitive functioning                       Extremity/Trunk Assessment               Exercises Shoulder Exercises Elbow Flexion: AAROM;5 reps;Left Elbow Extension:  AAROM;5 reps;Left;Other (comment) (only able to toleratre approx 20 degrees) Wrist Flexion: AROM;10 reps;Left Wrist Extension: AROM;10 reps Hand Exercises Forearm Supination: AAROM;Left;10 reps Forearm Pronation: AAROM;Left;10 reps Wrist Flexion: AROM;Left;10 reps Wrist Extension: AROM;Left;10 reps Digit Composite Flexion: AROM;Left;10 reps Composite Extension: AROM;Left;10 reps   Shoulder Instructions       General Comments      Pertinent Vitals/ Pain       Pain Score: 8  Pain Location: L shoudler with elbow ROM Pain Descriptors / Indicators: Discomfort;Grimacing;Guarding Pain Intervention(s): Monitored during session;Repositioned;Patient requesting pain meds-RN notified;Relaxation;Limited activity within patient's tolerance  Home Living                                          Prior Functioning/Environment              Frequency  Min 2X/week        Progress Toward Goals  OT Goals(current goals can now be found in the care plan section)  Progress towards OT goals: Progressing toward goals  Acute Rehab OT Goals Patient Stated Goal: decreased pain  Plan Discharge plan remains appropriate    Co-evaluation                 End of Session     Activity Tolerance Patient limited by pain   Patient Left in bed;with nursing/sitter in room   Nurse Communication Mobility status        Time: [id number] OT Time Calculation (min): 10 min  Charges: OT General Charges $OT Visit: 1 Procedure OT Treatments $Therapeutic Exercise: 8-22 mins  House, Leith Hedlund D 03/15/2016, 10:17 AM

## 2016-03-15 NOTE — Discharge Summary (Addendum)
Physician Discharge Summary  Frank Ashley [id number] DOB: 1941/02/09 DOA: 03/10/2016  PCP: Eulas Post, MD  Admit date: 03/10/2016 Discharge date: 03/15/2016  Admitted From: [facility].  Disposition:  SNF.  Recommendations for Outpatient Follow-up:  1. Follow up with PCP in 1-2 weeks 2. Please obtain [facility] Wednesday.  3. Please follow up with orthopedics as recommended.  4. Please follow up with oncology as recommended.     Discharge Condition: stable.  CODE STATUS:full code.  Diet recommendation: Heart Healthy / Carb Modified   Brief/Interim Summary: Beverely Pace Jamesis a 75 y.o.malewith metastatic renal cell carcinoma being treated at [facility] presents to the ER after patient had a fall, was found to have left humeral fracture, orthopedics consulted , recommended sling , immobilizer, and PT, OT eval AND outpatient follow up . Meanwhile his work up revealed hypocalcemia and hyperkalemia.   Discharge Diagnoses:  Active Problems:   Hereditary and idiopathic peripheral neuropathy   Sleep apnea   CAD (coronary artery disease)   Hypertension   Type 2 diabetes, controlled, with peripheral neuropathy (HCC)   Metastatic renal cell carcinoma (HCC)   Hypocalcemia   Proximal humerus fracture   Humerus fracture   Protein-calorie malnutrition, severe   Pressure injury of skin diabetic foot ulcer on the left plantar aspect.    Left humeral fracture after a mechanical fall.  Orthopedics consulted. On a sling, immobilize.  PT/ot evaluation recommending SNF.  Pain control with dilaudid and percocet.  Outpatient follow up with orthopedics.    Diabetes mellitus: CBG (last 3)   Recent Labs (last 2 labs)    Recent Labs  03/14/16 0746 03/14/16 0843 03/14/16 1201  GLUCAP 198* 214* 126*      Resume long acting insulin.   Hypocalcemia: IV calcium gluconate ordered on admission but couldn't be given as he doesn t have IV access.  Oral calcium  supplements added. His corrected calcium level is 7.98, with an albumin level of 1.9.   He received  A total of 3 g of IV calcium gluconate ordered.   Hyperkalemia: as per the patient, he has high potassium "all the time". One dose of kayexalate did not work. Repeat kayexalate  and repeat level is normal.  EKG ordered showed low voltage leads, and normal sinus rhythm, .      Acute renal failure : Suspect from dehydration, encouraged oral hydration, improved   Microcytic anemia:  - hemoglobin at baseline around 9.  - monitor .   Metastatic renal cell carcinoma: further management as per his oncologist .   Hypertension:  Well controlled.   CAD: No chest pain or sob. Resume home meds.   diabetic foot ulcer on the left plantar aspect.  Gauze dressing,     Discharge Instructions  Discharge Instructions    Discharge instructions    Complete by:  As directed    Please follow up with PCP in one week.  Please follow up with oncology as recommended.  Please check calcium level on Wednesday.       Medication List    STOP taking these medications   amoxicillin 500 MG capsule Commonly known as:  AMOXIL   becaplermin 0.01 % gel Commonly known as:  REGRANEX   benzonatate 200 MG capsule Commonly known as:  TESSALON   insulin NPH-regular Human (70-30) 100 UNIT/ML injection Commonly known as:  NOVOLIN 70/30 RELION Replaced by:  insulin aspart protamine- aspart (70-30) 100 UNIT/ML injection   mirtazapine 15 MG tablet Commonly known as:  REMERON   oxiconazole 1 % Crea Commonly known as:  OXISTAT     TAKE these medications   amLODipine 10 MG tablet Commonly known as:  NORVASC Take 10 mg by mouth daily.   aspirin 325 MG EC tablet Take 325 mg by mouth daily.   atorvastatin 20 MG tablet Commonly known as:  LIPITOR Take 20 mg by mouth daily.   calcium-vitamin D 500-200 MG-UNIT tablet Commonly known as:  OSCAL WITH D Take 2 tablets by mouth 2 (two) times  daily.   carvedilol 12.5 MG tablet Commonly known as:  COREG Take 12.5 mg by mouth 2 (two) times daily.   feeding supplement (ENSURE ENLIVE) Liqd Take 237 mLs by mouth 2 (two) times daily between meals.   gabapentin 800 MG tablet Commonly known as:  NEURONTIN Take 1 tablet (800 mg total) by mouth 3 (three) times daily.   glucose blood test strip Commonly known as:  TRUETRACK TEST USE ONE STRIP TO CHECK GLUCOSE THREE TIMES DAILY       DX E11.9   HYDROmorphone 2 MG tablet Commonly known as:  DILAUDID Take 1 tablet (2 mg total) by mouth every 4 (four) hours as needed for severe pain.   insulin aspart protamine- aspart (70-30) 100 UNIT/ML injection Commonly known as:  NOVOLOG MIX 70/30 Inject 0.1 mLs (10 Units total) into the skin 2 (two) times daily with a meal. Replaces:  insulin NPH-regular Human (70-30) 100 UNIT/ML injection   LORazepam 0.5 MG tablet Commonly known as:  ATIVAN Take 1 tablet (0.5 mg total) by mouth every 6 (six) hours as needed for anxiety. What changed:  when to take this   meclizine 25 MG tablet Commonly known as:  ANTIVERT Take 25 mg by mouth daily as needed for dizziness. Reported on 08/01/2015   megestrol 400 MG/10ML suspension Commonly known as:  MEGACE Take 10 mLs (400 mg total) by mouth daily.   omeprazole 40 MG capsule Commonly known as:  PRILOSEC Take 1 capsule (40 mg total) by mouth 2 (two) times daily.   oxyCODONE-acetaminophen 5-325 MG tablet Commonly known as:  PERCOCET/ROXICET Take 1-2 tablets by mouth every 6 (six) hours as needed for severe pain.   traMADol 50 MG tablet Commonly known as:  ULTRAM Take 1 tablet (50 mg total) by mouth every 6 (six) hours as needed for moderate pain.   triamcinolone cream 0.1 % Commonly known as:  KENALOG Apply topically 2 (two) times daily. Compound with Eucerin 1:1 and apply to affected area bid prn itching      Follow-up Information    Mcarthur Rossetti, MD. Schedule an appointment as soon  as possible for a visit in 1 week(s).   Specialty:  Orthopedic Surgery Why:  Follow up with an orthopedic surgeon; schedule an appointment for within 1 week Contact information: [address] [place] [address] [phone]        Eulas Post, MD Follow up.   Specialty:  Family Medicine Why:  As needed Contact information: [place] [address] [phone]        [place] DEPT Follow up.   Specialty:  Emergency Medicine Why:  As needed, if symptoms worsen Contact information: [place] [id number] [place] [phone]         Allergies  Allergen Reactions  . Codeine Shortness Of Breath    SOB, NERVOUSNESS  . Hydrocodone Shortness Of Breath  . Oxycodone Shortness Of Breath  SOB, NERVOUSNESS  . Sulfa Antibiotics Rash    Consultations:  orthopedics   Procedures/Studies: Dg Shoulder Left  Result Date: 03/11/2016 CLINICAL DATA:  Trip and fall injury.  Left shoulder deformity. EXAM: LEFT SHOULDER - 2+ VIEW COMPARISON:  None. FINDINGS: Comminuted fractures of the left proximal humerus with transverse impacted fracture across the left humeral surgical neck and longitudinal fractures extending over the base of the greater trochanter. Mild lateral angulation of the distal fracture fragments. Glenohumeral joint appears grossly intact although visualization is limited due to patient positioning. IMPRESSION: Comminuted fractures of the proximal left humerus. Electronically Signed   By: Lucienne Capers M.D.   On: 03/11/2016 00:41      Subjective: No new complaints.  Discharge Exam: Vitals:   03/15/16 0653 03/15/16 0930  BP: 132/66 130/70  Pulse: 88 87  Resp: 17 18  Temp: 98.5 F (36.9 C) 98.6 F (37 C)   Vitals:   03/14/16 1320 03/14/16 2246 03/15/16 0653 03/15/16 0930  BP: 132/65 139/66 132/66 130/70  Pulse: 88 91 88 87  Resp: 18 16 17 18    Temp: 98.2 F (36.8 C) 97.9 F (36.6 C) 98.5 F (36.9 C) 98.6 F (37 C)  TempSrc: Oral Oral Oral Oral  SpO2: 100% 100% 100% 100%  Weight:      Height:        General: Pt is alert, awake, not in acute distress Cardiovascular: RRR, S1/S2 +, no rubs, no gallops Respiratory: CTA bilaterally, no wheezing, no rhonchi Abdominal: Soft, NT, ND, bowel sounds + Extremities: no edema, no cyanosis, left arm in sling.  diabetic foot ulcer on the left plantar aspect.     The results of significant diagnostics from this hospitalization (including imaging, microbiology, ancillary and laboratory) are listed below for reference.     Microbiology: No results found for this or any previous visit (from the past 240 hour(s)).   Labs: BNP (last 3 results) No results for input(s): BNP in the last 8760 hours. Basic Metabolic Panel:  Recent Labs Lab 03/11/16 0943 03/12/16 0406 03/13/16 0841 03/14/16 0505 03/15/16 0503  NA 135 136 135 134* 136  K 5.3* 5.4* 4.8 4.7 4.7  CL 112* 113* 111 112* 110  CO2 17* 18* 18* 18* 18*  GLUCOSE 150* 171* 191* 215* 110*  BUN 22* 20 23* 26* 29*  CREATININE 1.40* 1.29* 1.24 1.30* 1.09  CALCIUM 5.4* 5.2* 5.2* 5.7* 6.3*   Liver Function Tests:  Recent Labs Lab 03/12/16 0406 03/14/16 0505 03/15/16 0503  AST  --  23 28  ALT  --  18 22  ALKPHOS  --  179* 238*  BILITOT  --  0.4 0.4  PROT  --  5.9* 6.1*  ALBUMIN 1.9* 1.7* 1.9*   No results for input(s): LIPASE, AMYLASE in the last 168 hours. No results for input(s): AMMONIA in the last 168 hours. CBC:  Recent Labs Lab 03/11/16 0230 03/12/16 0406  WBC 8.4 6.9  NEUTROABS 6.2  --   HGB 9.3* 8.9*  HCT 29.7* 28.1*  MCV 78.4 76.6*  PLT 435* 364   Cardiac Enzymes: No results for input(s): CKTOTAL, CKMB, CKMBINDEX, TROPONINI in the last 168 hours. BNP: Invalid input(s): POCBNP CBG:  Recent Labs Lab 03/14/16 1201 03/14/16 1724 03/14/16 2229 03/15/16 0739 03/15/16 1214  GLUCAP 126* 125* 71  157* 108*   D-Dimer No results for input(s): DDIMER in the last 72 hours. Hgb A1c No results for input(s): HGBA1C in the last 72 hours. Lipid Profile No results  for input(s): CHOL, HDL, LDLCALC, TRIG, CHOLHDL, LDLDIRECT in the last 72 hours. Thyroid function studies  Recent Labs  03/13/16 2010  TSH 2.872   Anemia work up No results for input(s): VITAMINB12, FOLATE, FERRITIN, TIBC, IRON, RETICCTPCT in the last 72 hours. Urinalysis    Component Value Date/Time   COLORURINE AMBER (A) 01/12/2016 0222   APPEARANCEUR CLOUDY (A) 01/12/2016 0222   LABSPEC 1.029 01/12/2016 0222   PHURINE 5.0 01/12/2016 0222   GLUCOSEU NEGATIVE 01/12/2016 0222   HGBUR LARGE (A) 01/12/2016 0222   BILIRUBINUR SMALL (A) 01/12/2016 0222   KETONESUR NEGATIVE 01/12/2016 0222   PROTEINUR 30 (A) 01/12/2016 0222   UROBILINOGEN 0.2 12/31/2011 2015   NITRITE NEGATIVE 01/12/2016 0222   LEUKOCYTESUR TRACE (A) 01/12/2016 0222   Sepsis Labs Invalid input(s): PROCALCITONIN,  WBC,  LACTICIDVEN Microbiology No results found for this or any previous visit (from the past 240 hour(s)).   Time coordinating discharge: Over 30 minutes  SIGNED:   Hosie Poisson, MD  [facility] 03/15/2016, 2:09 PM Pager   If 7PM-7AM, please contact night-coverage [email or web address] Password [email or web address]

## 2016-03-15 NOTE — Progress Notes (Signed)
[facility] [employer] authorization obtained Josem Kaufmann #: [id number]) next review date 11/15, RUG level = RVB. Narda Rutherford at [facility] aware.    Raynaldo Opitz, [facility] Clinical Social Worker cell #: [phone]

## 2016-03-15 NOTE — Progress Notes (Signed)
Calcium is 6.3 today.  Increased from 5.7.

## 2016-03-15 NOTE — Progress Notes (Signed)
PT Cancellation Note  Patient Details Name: TOAN MUSKA MRN: [id number] DOB: 17-Sep-1940   Cancelled Treatment:     received page from LPT just before 3 pm to attempt to see 1415.  Pt in bed in a lot of pain, appears anxious and I was unable to redirect his attention.  Per chart review, pt is D/Cing today around 4:30 to SNF.  RN notified for pain meds.   Rica Koyanagi  PTA [facility]  Acute  Rehab Pager      [phone]

## 2016-03-15 NOTE — Progress Notes (Signed)
Pt for d/c to SNF today. Pt and pt's son have accepted offer from [facility]. Pt's son to complete paperwork with Janie in admissions at SNF at [place] will arrange PTAR for transport at 4:30pm. CSW signing off at d/c.   Wandra Feinstein, MSW, LCSW [phone]  (coverage)

## 2016-03-15 NOTE — Care Management Note (Signed)
Case Management Note  Patient Details  Name: CARLOUS LENHART MRN: [id number] Date of Birth: [birth year]  Subjective/Objective:                    Action/Plan:dc SNF   Expected Discharge Date:   (unknown)               Expected Discharge Plan:  Skilled Nursing Facility  In-House Referral:  Clinical Social Work  Discharge planning Services  CM Consult  Post Acute Care Choice:    Choice offered to:     DME Arranged:    DME Agency:     HH Arranged:    [place] Agency:     Status of Service:  Completed, signed off  If discussed at [employer], dates discussed:    Additional Comments:  Dessa Phi, RN 03/15/2016, 11:33 AM

## 2016-03-29 ENCOUNTER — Ambulatory Visit (INDEPENDENT_AMBULATORY_CARE_PROVIDER_SITE_OTHER): Payer: Medicare Other | Admitting: Physician Assistant

## 2016-03-31 ENCOUNTER — Ambulatory Visit (INDEPENDENT_AMBULATORY_CARE_PROVIDER_SITE_OTHER): Payer: Self-pay

## 2016-03-31 ENCOUNTER — Ambulatory Visit (INDEPENDENT_AMBULATORY_CARE_PROVIDER_SITE_OTHER): Payer: Medicare Other | Admitting: Physician Assistant

## 2016-03-31 DIAGNOSIS — S42292A Other displaced fracture of upper end of left humerus, initial encounter for closed fracture: Secondary | ICD-10-CM

## 2016-03-31 DIAGNOSIS — S42292D Other displaced fracture of upper end of left humerus, subsequent encounter for fracture with routine healing: Secondary | ICD-10-CM

## 2016-03-31 DIAGNOSIS — M25512 Pain in left shoulder: Secondary | ICD-10-CM

## 2016-03-31 NOTE — Progress Notes (Signed)
Office Visit Note   Patient: Frank Ashley           Date of Birth: 1940/08/13           MRN: [id number] Visit Date: 03/31/2016              Requested by: Eulas Post, MD [place], [place] [address] PCP: Eulas Post, MD   Assessment & Plan: Visit Diagnoses:  1. Acute pain of left shoulder   2. Other closed displaced fracture of proximal end of left humerus, initial encounter     Plan: Encouraged range of motion of the elbow, supination and pronation of forearm and range of motion of the hand. No forward flexion and abduction of left shoulder. Sling at night in bed was as needed for comfort. Follow up to a half weeks for radiographs of left shoulder.  Follow-Up Instructions: Return in about 18 days (around 04/18/2016) for Radiographs.   Orders:  Orders Placed This Encounter  Procedures  . XR Shoulder Left   No orders of the defined types were placed in this encounter.     Procedures: No procedures performed   Clinical Data: No additional findings.   Subjective: Chief Complaint  Patient presents with  . Left Shoulder - Pain, Fracture    2 1/2 weeks ago patient fell and broke his left shoulder. 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 Patient is staying at [facility]. He is being treated with a sling and swath.    Review of Systems See HPI  Objective: Vital Signs: There were no vitals taken for this visit.  Physical Exam  Constitutional: He is oriented to person, place, and time. He appears well-developed and well-nourished.  Neurological: He is alert and oriented to person, place, and time.  Psychiatric: He has a normal mood and affect.    Ortho Exam Left elbow good range of motion elbow full supination pronation . Radial pulses 2 plus of the hand and full motor/sensation of the left hand.  Specialty Comments:  No  specialty comments available.  Imaging: Xr Shoulder Left  Result Date: 03/31/2016 2 views left shoulder: Proximal humerus comminuted transverse fracture. Humeral head appears well located. No other obvious bony abnormalities or fractures    PMFS History: Patient Active Problem List   Diagnosis Date Noted  . Pressure injury of skin 03/13/2016  . Protein-calorie malnutrition, severe 03/12/2016  . Hypocalcemia 03/11/2016  . Proximal humerus fracture 03/11/2016  . Humerus fracture 03/11/2016  . Closed fracture of left proximal humerus   . Acquired hammer toe of right foot 03/11/2014  . Ulcer of foot, chronic ([place]) 02/18/2014  . 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 Metastatic renal cell carcinoma ([place]) 11/27/2013  . Obesity (BMI 30-39.9) 05/15/2013  . Anxiety 01/21/2012  . Biliary drain displacement - T-tube replaced 01/01/2012  . Ejection fraction   . CAD (coronary artery disease)   . Hypertension   . Type 2 diabetes, controlled, with peripheral neuropathy ([place])   . Hyperlipidemia   . Right renal mass 09/24/2011  . Bacteremia due to Escherichia coli 09/18/2011  . Ascending cholangitis 09/14/2011  . 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 Overweight(278.02) 10/04/2008  . Sleep apnea 10/04/2008  . Ulcer of heel and midfoot ([place]) 08/01/2008  . HYPERLIPIDEMIA 07/24/2008  . Hereditary and idiopathic peripheral neuropathy 07/24/2008  . Allergic rhinitis, cause unspecified 07/24/2008  .  GERD 07/24/2008  . Chronic kidney disease, unspecified 07/24/2008   Past Medical History:  Diagnosis Date  . Acute pancreatitis 03/12/2010   Qualifier: Diagnosis of  By: Elease Hashimoto MD, Bruce    . Anxiety    SINCE MAY 2013 AND DIAGNOSIS OF CANCER-PT WAS HAVING PANIC ATTACKS-FELT LIKE HE COULDN'T BREATHE--STATES HE IS FEELING BETTER ON ALPRAZOLAM  . Arthritis HANDS  . BPH (benign prostatic hypertrophy)   . CAD (coronary artery disease)    Circumflex stent 1996  //  catheterization January,  2000, 50-60% mid LAD,  dominant circumflex-50% in-stent restenosis similar to a cath in 1999, RCA small and nondominant,, EF 60%  . Cancer [facility])    CANCER IN RIGHT KIDNEY-SURGERY PLANNED  . Choledocholithiasis with chronic cholecystitis    BILE DUCT STONES AND GALLSTONES  . Complication of anesthesia clostraphobic  . Diabetes mellitus type 2, insulin dependent ([place])   . Diabetic foot ulcer ([place]) CURRENT RIGHT GREAT TOE WOUND W/ DRAINAGE --  DRESSING CHANGE EVERY OTHER DAY  . DIABETIC FOOT ULCER, TOE 08/01/2008   Qualifier: Diagnosis of  By: Elease Hashimoto MD, Bruce    . Ejection fraction    EF 60%, catheterization, 2000  //   EF 55-60%, echo, July, 2013  . Frequency of urination   . Generalized rash RIGHT SHOULDER/ BACK AREA  . GERD (gastroesophageal reflux disease)   . Hematuria    YRS AGO  . History of acute pancreatitis 2008  . History of gout STABLE PER PT  . History of kidney stones AGE 32  . History of MRSA infection 10 YRS AGO /  SUPERFICIAL SKIN AREA  . History of peptic ulcer disease   . Hyperlipidemia   . Hypertension   . Left leg numbness COMPLETE NUMBNESS FROM HIP DOWN TO FOOT  . Nocturia   . Numbness in right leg FROM KNEE DOWN TO FOOT  . OSA (obstructive sleep apnea) CPAP USED UP UNTIL 2 YRS AGO  WHEN MASK WAS DAMAGED   . Peripheral neuropathy ([place])   . Preop cardiovascular exam    Cardiac clearance for nephrectomy and cholecystectomy June, 2013  . Renal insufficiency   . Renal mass, right     Family History  Problem Relation Age of Onset  . Colon cancer Mother 77  . Colon cancer Sister 23  . Colon cancer Brother 49  . Stomach cancer Neg Hx     Past Surgical History:  Procedure Laterality Date  . BILROTH II PROCEDURE  AGE 72   2 MONTHS LATER,  BILROTH II RECONSTRUCTION  . CARDIAC CATHETERIZATION  05-04-1998--- REPORT W/ CHART   50%  IN-STENT RESTENOSIS OF CIRCUMFLEX/ 20% LEFT MAIN/ 50-60% LAD/ RCA NORMAL/ LV NORMAL / EF 60%  . CATARACT EXTRACTION W/ INTRAOCULAR  LENS  IMPLANT, BILATERAL  2005  . CHOLECYSTECTOMY  12/20/2011   Procedure: CHOLECYSTECTOMY WITH COMMON DUCT EXPLORATION;  Surgeon: Marcello Moores A. Cornett, MD;  Location: [facility];  Service: General;  Laterality: N/A;  choloductoscopy  . CORONARY ANGIOPLASTY WITH STENT PLACEMENT  1996   STENTING X1 CIRCUMFLEX  . CT PERC CHOLECYSTOSTOMY  09-17-2011  . CYSTOSCOPY/RETROGRADE/URETEROSCOPY  10/26/2011   Procedure: CYSTOSCOPY/RETROGRADE/URETEROSCOPY;  Surgeon: Fredricka Bonine, MD;  Location: [facility];  Service: Urology;  Laterality: N/A;  RIGHT URETEROSCOPY AND RIGHT RETROGRADE PYELOGRAM  DIABETIC C ARM   . ESOPHAGOGASTRODUODENOSCOPY  09/16/2011   Procedure: ESOPHAGOGASTRODUODENOSCOPY (EGD);  Surgeon: Missy Sabins, MD;  Location: [facility];  Service: Endoscopy;  Laterality: N/A;  . INTRAOPERATIVE CHOLANGIOGRAM  12/20/2011   Procedure: INTRAOPERATIVE CHOLANGIOGRAM;  Surgeon: Joyice Faster. Cornett, MD;  Location: [facility];  Service: General;;  . INTRAOPERATIVE CHOLANGIOGRAM  12/24/2011   Procedure: INTRAOPERATIVE CHOLANGIOGRAM;  Surgeon: Joyice Faster. Cornett, MD;  Location: [facility];  Service: General;  Laterality: N/A;  . LAPAROTOMY  12/24/2011   Procedure: EXPLORATORY LAPAROTOMY;  Surgeon: Joyice Faster. Cornett, MD;  Location: [facility];  Service: General;  Laterality: N/A;  Replacement of T-tube and Bile duct exploration  . PARTIAL NEPHRECTOMY  12/20/2011   Procedure: NEPHRECTOMY PARTIAL;  Surgeon: Fredricka Bonine, MD;  Location: [facility];  Service: Urology;  Laterality: Right;  Right Partial nephrectomy  . PENILE PROSTHESIS IMPLANT  2008   REMOVED 2010 DUE TO MALFUNCTION  . REMOVAL OF PENILE PROSTHESIS    . RIGHT GREAT TOE SURG.  2000  . SURG FOR REPAIR OF PERFERATED PEPTIC ULCER  AGE 36   Social History   Occupational History  . Not on file.   Social History Main Topics  . Smoking status: Former Smoker    Packs/day: 0.25    Years: 2.00    Types: Cigarettes    Quit date: 02/25/1960   . Smokeless tobacco: Never Used  . Alcohol use No  . Drug use: No  . Sexual activity: Not on file

## 2016-04-15 ENCOUNTER — Ambulatory Visit (INDEPENDENT_AMBULATORY_CARE_PROVIDER_SITE_OTHER): Payer: Self-pay

## 2016-04-15 ENCOUNTER — Encounter (INDEPENDENT_AMBULATORY_CARE_PROVIDER_SITE_OTHER): Payer: Self-pay | Admitting: Physician Assistant

## 2016-04-15 ENCOUNTER — Ambulatory Visit (INDEPENDENT_AMBULATORY_CARE_PROVIDER_SITE_OTHER): Payer: Medicare Other | Admitting: Physician Assistant

## 2016-04-15 VITALS — Ht 73.0 in | Wt 186.4 lb

## 2016-04-15 DIAGNOSIS — S42202D Unspecified fracture of upper end of left humerus, subsequent encounter for fracture with routine healing: Secondary | ICD-10-CM

## 2016-04-15 DIAGNOSIS — M25512 Pain in left shoulder: Secondary | ICD-10-CM | POA: Diagnosis not present

## 2016-04-15 NOTE — Progress Notes (Signed)
Office Visit Note   Patient: Frank Ashley           Date of Birth: [birth year]           MRN: [id number] Visit Date: 04/15/2016              Requested by: Eulas Post, MD [place], [place] [id number] PCP: Eulas Post, MD   Assessment & Plan: Visit Diagnoses:  1. Acute pain of left shoulder   2. Closed fracture of proximal end of left humerus with routine healing, unspecified fracture morphology, subsequent encounter     Plan: Continue nonweightbearing left upper extremity. Continue intense therapy for gait and balance. Benefit from skilled nursing care for treatment of her ADLs. Follow-up in one month's views of the left shoulder at that time. We will advance him to weightbearing as tolerated left upper extremity at that time  Follow-Up Instructions: No Follow-up on file.   Orders:  Orders Placed This Encounter  Procedures  . XR Shoulder Left   No orders of the defined types were placed in this encounter.     Procedures: No procedures performed   Clinical Data: No additional findings.   Subjective: Chief Complaint  Patient presents with  . Left Shoulder - Follow-up  . Follow-up    Patient ambulates in a wheelchair.  Patient states left shoulder is painful, states he has been wearing his sling.  Would like to know what the next step is for his shoulder?    Review of Systems   Objective: Vital Signs: Ht 6\' 1"  (1.854 m)   Wt 186 lb 6.4 oz (84.6 kg)   BMI 24.59 kg/m   Physical Exam  Constitutional: He is oriented to person, place, and time. He appears well-developed and well-nourished. No distress.  Neurological: He is alert and oriented to person, place, and time.    Ortho Exam Left upper extremity: Radial pulse 2+, sensation and motor intact of the hand. He has the ability bring the elbow out to full extension and near full flexion. Has full supination pronation of forearm. Specialty Comments:  No specialty comments  available.  Imaging: Xr Shoulder Left  Result Date: 04/15/2016 AP Y view of left shoulder: No change in overall position alignment of the proximal humerus fracture. Early consolidation seen. No other fractures. Humeral head appears located    PMFS History: Patient Active Problem List   Diagnosis Date Noted  . Pressure injury of skin 03/13/2016  . Protein-calorie malnutrition, severe 03/12/2016  . Hypocalcemia 03/11/2016  . Proximal humerus fracture 03/11/2016  . Humerus fracture 03/11/2016  . Closed fracture of left proximal humerus   . Acquired hammer toe of right foot 03/11/2014  . Ulcer of foot, chronic ([place]) 02/18/2014  . 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 Metastatic renal cell carcinoma ([place]) 11/27/2013  . Obesity (BMI 30-39.9) 05/15/2013  . Anxiety 01/21/2012  . Biliary drain displacement - T-tube replaced 01/01/2012  . Ejection fraction   . CAD (coronary artery disease)   . Hypertension   . Type 2 diabetes, controlled, with peripheral neuropathy ([place])   . Hyperlipidemia   . Right renal mass 09/24/2011  . Bacteremia due to Escherichia coli 09/18/2011  . Ascending cholangitis 09/14/2011  . 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 Overweight(278.02) 10/04/2008  . Sleep apnea 10/04/2008  . Ulcer of heel and midfoot ([place]) 08/01/2008  . HYPERLIPIDEMIA 07/24/2008  . Hereditary and idiopathic peripheral neuropathy 07/24/2008  . Allergic rhinitis, cause unspecified  07/24/2008  . GERD 07/24/2008  . Chronic kidney disease, unspecified 07/24/2008   Past Medical History:  Diagnosis Date  . Acute pancreatitis 03/12/2010   Qualifier: Diagnosis of  By: Elease Hashimoto MD, Bruce    . Anxiety    SINCE MAY 2013 AND DIAGNOSIS OF CANCER-PT WAS HAVING PANIC ATTACKS-FELT LIKE HE COULDN'T BREATHE--STATES HE IS FEELING BETTER ON ALPRAZOLAM  . Arthritis HANDS  . BPH (benign prostatic hypertrophy)   . CAD (coronary artery disease)    Circumflex stent 1996  //  catheterization January,  2000, 50-60% mid LAD,  dominant circumflex-50% in-stent restenosis similar to a cath in 1999, RCA small and nondominant,, EF 60%  . Cancer [facility])    CANCER IN RIGHT KIDNEY-SURGERY PLANNED  . Choledocholithiasis with chronic cholecystitis    BILE DUCT STONES AND GALLSTONES  . Complication of anesthesia clostraphobic  . Diabetes mellitus type 2, insulin dependent ([place])   . Diabetic foot ulcer ([place]) CURRENT RIGHT GREAT TOE WOUND W/ DRAINAGE --  DRESSING CHANGE EVERY OTHER DAY  . DIABETIC FOOT ULCER, TOE 08/01/2008   Qualifier: Diagnosis of  By: Elease Hashimoto MD, Bruce    . Ejection fraction    EF 60%, catheterization, 2000  //   EF 55-60%, echo, July, 2013  . Frequency of urination   . Generalized rash RIGHT SHOULDER/ BACK AREA  . GERD (gastroesophageal reflux disease)   . Hematuria    YRS AGO  . History of acute pancreatitis 2008  . History of gout STABLE PER PT  . History of kidney stones AGE 54  . History of MRSA infection 10 YRS AGO /  SUPERFICIAL SKIN AREA  . History of peptic ulcer disease   . Hyperlipidemia   . Hypertension   . Left leg numbness COMPLETE NUMBNESS FROM HIP DOWN TO FOOT  . Nocturia   . Numbness in right leg FROM KNEE DOWN TO FOOT  . OSA (obstructive sleep apnea) CPAP USED UP UNTIL 2 YRS AGO  WHEN MASK WAS DAMAGED   . Peripheral neuropathy ([place])   . Preop cardiovascular exam    Cardiac clearance for nephrectomy and cholecystectomy June, 2013  . Renal insufficiency   . Renal mass, right     Family History  Problem Relation Age of Onset  . Colon cancer Mother 57  . Colon cancer Sister 19  . Colon cancer Brother 40  . Stomach cancer Neg Hx     Past Surgical History:  Procedure Laterality Date  . BILROTH II PROCEDURE  AGE 89   2 MONTHS LATER,  BILROTH II RECONSTRUCTION  . CARDIAC CATHETERIZATION  05-04-1998--- REPORT W/ CHART   50%  IN-STENT RESTENOSIS OF CIRCUMFLEX/ 20% LEFT MAIN/ 50-60% LAD/ RCA NORMAL/ LV NORMAL / EF 60%  . CATARACT EXTRACTION W/ INTRAOCULAR  LENS  IMPLANT, BILATERAL  2005  . CHOLECYSTECTOMY  12/20/2011   Procedure: CHOLECYSTECTOMY WITH COMMON DUCT EXPLORATION;  Surgeon: Marcello Moores A. Cornett, MD;  Location: [facility];  Service: General;  Laterality: N/A;  choloductoscopy  . CORONARY ANGIOPLASTY WITH STENT PLACEMENT  1996   STENTING X1 CIRCUMFLEX  . CT PERC CHOLECYSTOSTOMY  09-17-2011  . CYSTOSCOPY/RETROGRADE/URETEROSCOPY  10/26/2011   Procedure: CYSTOSCOPY/RETROGRADE/URETEROSCOPY;  Surgeon: Fredricka Bonine, MD;  Location: [facility];  Service: Urology;  Laterality: N/A;  RIGHT URETEROSCOPY AND RIGHT RETROGRADE PYELOGRAM  DIABETIC C ARM   . ESOPHAGOGASTRODUODENOSCOPY  09/16/2011   Procedure: ESOPHAGOGASTRODUODENOSCOPY (EGD);  Surgeon: Missy Sabins, MD;  Location: [facility];  Service: Endoscopy;  Laterality: N/A;  .  INTRAOPERATIVE CHOLANGIOGRAM  12/20/2011   Procedure: INTRAOPERATIVE CHOLANGIOGRAM;  Surgeon: Marcello Moores A. Cornett, MD;  Location: [facility];  Service: General;;  . INTRAOPERATIVE CHOLANGIOGRAM  12/24/2011   Procedure: INTRAOPERATIVE CHOLANGIOGRAM;  Surgeon: Joyice Faster. Cornett, MD;  Location: [facility];  Service: General;  Laterality: N/A;  . LAPAROTOMY  12/24/2011   Procedure: EXPLORATORY LAPAROTOMY;  Surgeon: Joyice Faster. Cornett, MD;  Location: [facility];  Service: General;  Laterality: N/A;  Replacement of T-tube and Bile duct exploration  . PARTIAL NEPHRECTOMY  12/20/2011   Procedure: NEPHRECTOMY PARTIAL;  Surgeon: Fredricka Bonine, MD;  Location: [facility];  Service: Urology;  Laterality: Right;  Right Partial nephrectomy  . PENILE PROSTHESIS IMPLANT  2008   REMOVED 2010 DUE TO MALFUNCTION  . REMOVAL OF PENILE PROSTHESIS    . RIGHT GREAT TOE SURG.  2000  . SURG FOR REPAIR OF PERFERATED PEPTIC ULCER  AGE 75   Social History   Occupational History  . Not on file.   Social History Main Topics  . Smoking status: Former Smoker    Packs/day: 0.25    Years: 2.00    Types: Cigarettes    Quit date: 02/25/1960   . Smokeless tobacco: Never Used  . Alcohol use No  . Drug use: No  . Sexual activity: Not on file

## 2016-04-28 ENCOUNTER — Emergency Department (HOSPITAL_COMMUNITY): Payer: Medicare Other

## 2016-04-28 ENCOUNTER — Inpatient Hospital Stay (HOSPITAL_COMMUNITY)
Admission: EM | Admit: 2016-04-28 | Discharge: 2016-05-03 | DRG: 871 | Disposition: E | Payer: Medicare Other | Attending: Internal Medicine | Admitting: Internal Medicine

## 2016-04-28 ENCOUNTER — Encounter (HOSPITAL_COMMUNITY): Payer: Self-pay | Admitting: *Deleted

## 2016-04-28 DIAGNOSIS — E1142 Type 2 diabetes mellitus with diabetic polyneuropathy: Secondary | ICD-10-CM | POA: Diagnosis present

## 2016-04-28 DIAGNOSIS — Z881 Allergy status to other antibiotic agents status: Secondary | ICD-10-CM

## 2016-04-28 DIAGNOSIS — E875 Hyperkalemia: Secondary | ICD-10-CM | POA: Diagnosis present

## 2016-04-28 DIAGNOSIS — D649 Anemia, unspecified: Secondary | ICD-10-CM

## 2016-04-28 DIAGNOSIS — E1165 Type 2 diabetes mellitus with hyperglycemia: Secondary | ICD-10-CM | POA: Diagnosis present

## 2016-04-28 DIAGNOSIS — Z6824 Body mass index (BMI) 24.0-24.9, adult: Secondary | ICD-10-CM

## 2016-04-28 DIAGNOSIS — R627 Adult failure to thrive: Secondary | ICD-10-CM | POA: Diagnosis present

## 2016-04-28 DIAGNOSIS — E872 Acidosis: Secondary | ICD-10-CM | POA: Diagnosis present

## 2016-04-28 DIAGNOSIS — Z79899 Other long term (current) drug therapy: Secondary | ICD-10-CM

## 2016-04-28 DIAGNOSIS — G4733 Obstructive sleep apnea (adult) (pediatric): Secondary | ICD-10-CM | POA: Diagnosis present

## 2016-04-28 DIAGNOSIS — Z800 Family history of malignant neoplasm of digestive organs: Secondary | ICD-10-CM

## 2016-04-28 DIAGNOSIS — L8990 Pressure ulcer of unspecified site, unspecified stage: Secondary | ICD-10-CM | POA: Diagnosis present

## 2016-04-28 DIAGNOSIS — Z7982 Long term (current) use of aspirin: Secondary | ICD-10-CM

## 2016-04-28 DIAGNOSIS — N400 Enlarged prostate without lower urinary tract symptoms: Secondary | ICD-10-CM | POA: Diagnosis present

## 2016-04-28 DIAGNOSIS — Z8614 Personal history of Methicillin resistant Staphylococcus aureus infection: Secondary | ICD-10-CM

## 2016-04-28 DIAGNOSIS — Z955 Presence of coronary angioplasty implant and graft: Secondary | ICD-10-CM | POA: Diagnosis not present

## 2016-04-28 DIAGNOSIS — N179 Acute kidney failure, unspecified: Secondary | ICD-10-CM

## 2016-04-28 DIAGNOSIS — Z9221 Personal history of antineoplastic chemotherapy: Secondary | ICD-10-CM

## 2016-04-28 DIAGNOSIS — A419 Sepsis, unspecified organism: Principal | ICD-10-CM

## 2016-04-28 DIAGNOSIS — Z515 Encounter for palliative care: Secondary | ICD-10-CM | POA: Diagnosis not present

## 2016-04-28 DIAGNOSIS — E43 Unspecified severe protein-calorie malnutrition: Secondary | ICD-10-CM | POA: Diagnosis present

## 2016-04-28 DIAGNOSIS — F410 Panic disorder [episodic paroxysmal anxiety] without agoraphobia: Secondary | ICD-10-CM | POA: Diagnosis present

## 2016-04-28 DIAGNOSIS — E785 Hyperlipidemia, unspecified: Secondary | ICD-10-CM | POA: Diagnosis present

## 2016-04-28 DIAGNOSIS — D638 Anemia in other chronic diseases classified elsewhere: Secondary | ICD-10-CM | POA: Diagnosis present

## 2016-04-28 DIAGNOSIS — J189 Pneumonia, unspecified organism: Secondary | ICD-10-CM

## 2016-04-28 DIAGNOSIS — K219 Gastro-esophageal reflux disease without esophagitis: Secondary | ICD-10-CM | POA: Diagnosis present

## 2016-04-28 DIAGNOSIS — M109 Gout, unspecified: Secondary | ICD-10-CM | POA: Diagnosis present

## 2016-04-28 DIAGNOSIS — Z961 Presence of intraocular lens: Secondary | ICD-10-CM | POA: Diagnosis present

## 2016-04-28 DIAGNOSIS — L89150 Pressure ulcer of sacral region, unstageable: Secondary | ICD-10-CM

## 2016-04-28 DIAGNOSIS — J9601 Acute respiratory failure with hypoxia: Secondary | ICD-10-CM | POA: Diagnosis not present

## 2016-04-28 DIAGNOSIS — Y95 Nosocomial condition: Secondary | ICD-10-CM | POA: Diagnosis present

## 2016-04-28 DIAGNOSIS — Z905 Acquired absence of kidney: Secondary | ICD-10-CM

## 2016-04-28 DIAGNOSIS — I2510 Atherosclerotic heart disease of native coronary artery without angina pectoris: Secondary | ICD-10-CM | POA: Diagnosis present

## 2016-04-28 DIAGNOSIS — Z87891 Personal history of nicotine dependence: Secondary | ICD-10-CM

## 2016-04-28 DIAGNOSIS — Z87442 Personal history of urinary calculi: Secondary | ICD-10-CM

## 2016-04-28 DIAGNOSIS — R0602 Shortness of breath: Secondary | ICD-10-CM

## 2016-04-28 DIAGNOSIS — Z885 Allergy status to narcotic agent status: Secondary | ICD-10-CM

## 2016-04-28 DIAGNOSIS — R0603 Acute respiratory distress: Secondary | ICD-10-CM

## 2016-04-28 DIAGNOSIS — Z794 Long term (current) use of insulin: Secondary | ICD-10-CM

## 2016-04-28 DIAGNOSIS — Z66 Do not resuscitate: Secondary | ICD-10-CM | POA: Diagnosis present

## 2016-04-28 DIAGNOSIS — Z85528 Personal history of other malignant neoplasm of kidney: Secondary | ICD-10-CM

## 2016-04-28 LAB — BLOOD GAS, VENOUS
ACID-BASE DEFICIT: 11.9 mmol/L — AB (ref 0.0–2.0)
BICARBONATE: 12.9 mmol/L — AB (ref 20.0–28.0)
Delivery systems: POSITIVE
FIO2: 0.5
Mode: POSITIVE
O2 SAT: 51.8 %
PATIENT TEMPERATURE: 98.6
PH VEN: 7.313 (ref 7.250–7.430)
pCO2, Ven: 26.3 mmHg — ABNORMAL LOW (ref 44.0–60.0)
pO2, Ven: 31.3 mmHg — CL (ref 32.0–45.0)

## 2016-04-28 LAB — CBC WITH DIFFERENTIAL/PLATELET
Basophils Absolute: 0 10*3/uL (ref 0.0–0.1)
Basophils Relative: 0 %
Eosinophils Absolute: 0 10*3/uL (ref 0.0–0.7)
Eosinophils Relative: 0 %
HEMATOCRIT: 24.5 % — AB (ref 39.0–52.0)
Hemoglobin: 7.5 g/dL — ABNORMAL LOW (ref 13.0–17.0)
LYMPHS ABS: 0.6 10*3/uL — AB (ref 0.7–4.0)
Lymphocytes Relative: 9 %
MCH: 24.4 pg — AB (ref 26.0–34.0)
MCHC: 30.6 g/dL (ref 30.0–36.0)
MCV: 79.8 fL (ref 78.0–100.0)
MONO ABS: 0.1 10*3/uL (ref 0.1–1.0)
Monocytes Relative: 2 %
NEUTROS ABS: 6.4 10*3/uL (ref 1.7–7.7)
Neutrophils Relative %: 89 %
Platelets: 387 10*3/uL (ref 150–400)
RBC: 3.07 MIL/uL — AB (ref 4.22–5.81)
RDW: 19.9 % — AB (ref 11.5–15.5)
WBC Morphology: INCREASED
WBC: 7.1 10*3/uL (ref 4.0–10.5)

## 2016-04-28 LAB — PROTIME-INR
INR: 1.8
PROTHROMBIN TIME: 21.2 s — AB (ref 11.4–15.2)

## 2016-04-28 LAB — BASIC METABOLIC PANEL
ANION GAP: 11 (ref 5–15)
BUN: 54 mg/dL — ABNORMAL HIGH (ref 6–20)
CALCIUM: 7.6 mg/dL — AB (ref 8.9–10.3)
CO2: 11 mmol/L — ABNORMAL LOW (ref 22–32)
Chloride: 117 mmol/L — ABNORMAL HIGH (ref 101–111)
Creatinine, Ser: 2.03 mg/dL — ABNORMAL HIGH (ref 0.61–1.24)
GFR calc Af Amer: 35 mL/min — ABNORMAL LOW (ref >60)
GFR, EST NON AFRICAN AMERICAN: 31 mL/min — AB (ref >60)
GLUCOSE: 320 mg/dL — AB (ref 65–99)
Potassium: 5.8 mmol/L — ABNORMAL HIGH (ref 3.5–5.1)
SODIUM: 139 mmol/L (ref 135–145)

## 2016-04-28 LAB — COMPREHENSIVE METABOLIC PANEL
ALBUMIN: 1.6 g/dL — AB (ref 3.5–5.0)
ALT: 21 U/L (ref 17–63)
ANION GAP: 9 (ref 5–15)
AST: 38 U/L (ref 15–41)
Alkaline Phosphatase: 111 U/L (ref 38–126)
BILIRUBIN TOTAL: 0.9 mg/dL (ref 0.3–1.2)
BUN: 53 mg/dL — ABNORMAL HIGH (ref 6–20)
CALCIUM: 7.9 mg/dL — AB (ref 8.9–10.3)
CO2: 15 mmol/L — ABNORMAL LOW (ref 22–32)
Chloride: 113 mmol/L — ABNORMAL HIGH (ref 101–111)
Creatinine, Ser: 2.15 mg/dL — ABNORMAL HIGH (ref 0.61–1.24)
GFR, EST AFRICAN AMERICAN: 33 mL/min — AB (ref >60)
GFR, EST NON AFRICAN AMERICAN: 29 mL/min — AB (ref >60)
Glucose, Bld: 230 mg/dL — ABNORMAL HIGH (ref 65–99)
POTASSIUM: 6.6 mmol/L — AB (ref 3.5–5.1)
Sodium: 137 mmol/L (ref 135–145)
TOTAL PROTEIN: 5.8 g/dL — AB (ref 6.5–8.1)

## 2016-04-28 LAB — POTASSIUM: POTASSIUM: 5.5 mmol/L — AB (ref 3.5–5.1)

## 2016-04-28 LAB — PROCALCITONIN: PROCALCITONIN: 2.82 ng/mL

## 2016-04-28 LAB — I-STAT CG4 LACTIC ACID, ED: LACTIC ACID, VENOUS: 2.18 mmol/L — AB (ref 0.5–1.9)

## 2016-04-28 LAB — APTT: APTT: 32 s (ref 24–36)

## 2016-04-28 LAB — I-STAT TROPONIN, ED: TROPONIN I, POC: 0.02 ng/mL (ref 0.00–0.08)

## 2016-04-28 LAB — LACTIC ACID, PLASMA: Lactic Acid, Venous: 1.8 mmol/L (ref 0.5–1.9)

## 2016-04-28 LAB — BRAIN NATRIURETIC PEPTIDE: B NATRIURETIC PEPTIDE 5: 238.2 pg/mL — AB (ref 0.0–100.0)

## 2016-04-28 MED ORDER — SODIUM CHLORIDE 0.9% FLUSH
3.0000 mL | Freq: Two times a day (BID) | INTRAVENOUS | Status: DC
  Administered 2016-04-28: 3 mL via INTRAVENOUS

## 2016-04-28 MED ORDER — SODIUM CHLORIDE 0.9 % IV BOLUS (SEPSIS)
250.0000 mL | Freq: Once | INTRAVENOUS | Status: AC
  Administered 2016-04-28: 250 mL via INTRAVENOUS

## 2016-04-28 MED ORDER — SODIUM CHLORIDE 0.9 % IV BOLUS (SEPSIS)
2000.0000 mL | Freq: Once | INTRAVENOUS | Status: AC
  Administered 2016-04-28: 2000 mL via INTRAVENOUS

## 2016-04-28 MED ORDER — LORAZEPAM 2 MG/ML IJ SOLN
0.5000 mg | Freq: Once | INTRAMUSCULAR | Status: AC
  Administered 2016-04-28: 0.5 mg via INTRAVENOUS
  Filled 2016-04-28: qty 1

## 2016-04-28 MED ORDER — SODIUM CHLORIDE 0.9 % IV SOLN
INTRAVENOUS | Status: DC
  Administered 2016-04-28: 17:00:00 via INTRAVENOUS

## 2016-04-28 MED ORDER — HALOPERIDOL LACTATE 5 MG/ML IJ SOLN
1.0000 mg | Freq: Four times a day (QID) | INTRAMUSCULAR | Status: DC | PRN
  Administered 2016-04-28: 1 mg via INTRAVENOUS
  Filled 2016-04-28: qty 1

## 2016-04-28 MED ORDER — VANCOMYCIN HCL 10 G IV SOLR
1250.0000 mg | Freq: Once | INTRAVENOUS | Status: AC
  Administered 2016-04-28: 1250 mg via INTRAVENOUS
  Filled 2016-04-28: qty 1250

## 2016-04-28 MED ORDER — DEXTROSE 50 % IV SOLN
50.0000 mL | Freq: Once | INTRAVENOUS | Status: AC
  Administered 2016-04-28: 50 mL via INTRAVENOUS
  Filled 2016-04-28: qty 50

## 2016-04-28 MED ORDER — ENOXAPARIN SODIUM 40 MG/0.4ML ~~LOC~~ SOLN: 40.0000 mg | SUBCUTANEOUS | Status: DC

## 2016-04-28 MED ORDER — DEXTROSE 5 % IV SOLN
1.0000 g | Freq: Two times a day (BID) | INTRAVENOUS | Status: DC
  Administered 2016-04-28 – 2016-04-29 (×2): 1 g via INTRAVENOUS
  Filled 2016-04-28 (×3): qty 1

## 2016-04-28 MED ORDER — ACETAMINOPHEN 650 MG RE SUPP: 650.0000 mg | Freq: Four times a day (QID) | RECTAL | Status: DC | PRN

## 2016-04-28 MED ORDER — VANCOMYCIN HCL 10 G IV SOLR
1250.0000 mg | INTRAVENOUS | Status: DC
  Filled 2016-04-28: qty 1250

## 2016-04-28 MED ORDER — MORPHINE SULFATE (PF) 2 MG/ML IV SOLN
1.0000 mg | INTRAVENOUS | Status: DC | PRN
  Administered 2016-04-28 – 2016-04-29 (×3): 1 mg via INTRAVENOUS
  Filled 2016-04-28 (×3): qty 1

## 2016-04-28 MED ORDER — HALOPERIDOL LACTATE 5 MG/ML IJ SOLN
1.0000 mg | Freq: Once | INTRAMUSCULAR | Status: AC
  Administered 2016-04-29: 1 mg via INTRAVENOUS
  Filled 2016-04-28: qty 1

## 2016-04-28 MED ORDER — SODIUM CHLORIDE 0.9 % IV BOLUS (SEPSIS)
1000.0000 mL | Freq: Once | INTRAVENOUS | Status: AC
  Administered 2016-04-28: 1000 mL via INTRAVENOUS

## 2016-04-28 MED ORDER — INSULIN ASPART 100 UNIT/ML IV SOLN
10.0000 [IU] | Freq: Once | INTRAVENOUS | Status: AC
  Administered 2016-04-28: 10 [IU] via INTRAVENOUS
  Filled 2016-04-28: qty 0.1

## 2016-04-28 MED ORDER — ACETAMINOPHEN 325 MG PO TABS: 650.0000 mg | ORAL_TABLET | Freq: Four times a day (QID) | ORAL | Status: DC | PRN

## 2016-04-28 MED ORDER — SODIUM CHLORIDE 0.9 % IV BOLUS (SEPSIS)
500.0000 mL | Freq: Once | INTRAVENOUS | Status: AC
  Administered 2016-04-28: 500 mL via INTRAVENOUS

## 2016-04-28 NOTE — ED Triage Notes (Signed)
Pt who is on hospice and living at [facility] developed respiratory distress this am.  Hospice was notified who notified family and family demanded pt be transported to [facility] was was placed on Cpap due to 77% on RA.  Pt is alert and has a MOST form which states that he wants full scope of treatment.  Pt had 10albuterol, 0.5atrovent and 125mg  solumedrol.

## 2016-04-28 NOTE — ED Notes (Signed)
Pt was placed on NRB by RT on arrival to ED

## 2016-04-28 NOTE — ED Notes (Signed)
Main lab called to collect labs 

## 2016-04-28 NOTE — ED Notes (Signed)
Patient taken to ICU by RN

## 2016-04-28 NOTE — Progress Notes (Signed)
Pharmacy Antibiotic Note  Frank Ashley is a 75 y.o. male admitted on 04/26/2016 with pneumonia, hyperkalemia, and AKI. H/o renal cell cancer, currently on chemotherapy.  Pharmacy has been consulted for Vancomycin and Cefepime dosing.  SCr 2.15, CrCl~34 ml/min  Plan: Vancomycin 1250 mg IV q24h. F/u SCr and levels as needed. Cefepime 1g IV q12h.  Height: 6' (182.9 cm) Weight: 179 lb 7.3 oz (81.4 kg) IBW/kg (Calculated) : 77.6  Temp (24hrs), Avg:97.7 F (36.5 C), Min:97.4 F (36.3 C), Max:98 F (36.7 C)   Recent Labs Lab 04/23/2016 1205 04/04/2016 1212  WBC 7.1  --   CREATININE 2.15*  --   LATICACIDVEN  --  2.18*    Estimated Creatinine Clearance: 33.1 mL/min (by C-G formula based on SCr of 2.15 mg/dL (H)).    Allergies  Allergen Reactions  . Codeine Shortness Of Breath    SOB, NERVOUSNESS  . Hydrocodone Shortness Of Breath  . Oxycodone Shortness Of Breath    SOB, NERVOUSNESS  . Sulfa Antibiotics Rash    Antimicrobials this admission: 12/27 Vanc >> 12/27 Cefepime >>  Dose adjustments this admission: -  Microbiology results:   Thank you for allowing pharmacy to be a part of this patient's care.  Hershal Coria 04/24/2016 5:11 PM

## 2016-04-28 NOTE — ED Notes (Signed)
Unable to get the lab MD told us we could hold off for now

## 2016-04-28 NOTE — Progress Notes (Signed)
MD notified of patient pressure sustaining in [id number] systolic despite having almost received 2L bolus

## 2016-04-28 NOTE — ED Notes (Signed)
RN notified of abnormal lab 

## 2016-04-28 NOTE — ED Notes (Signed)
RN is attempting to draw labs

## 2016-04-28 NOTE — ED Provider Notes (Signed)
[facility] DEPT Provider Note   CSN: [id number] Arrival date & time: 04/07/2016  0848     History   Chief Complaint Chief Complaint  Patient presents with  . Respiratory Distress    HPI Frank Ashley is a 75 y.o. male.  HPI Patient with history of metastatic renal cell carcinoma presents with shortness of breath from a nursing home. Patient is unable to give further history due to respiratory distress. Level V caveat applies. Placed on CPAP for low oxygen saturations by EMS. Past Medical History:  Diagnosis Date  . Acute pancreatitis 03/12/2010   Qualifier: Diagnosis of  By: Elease Hashimoto MD, Bruce    . Anxiety    SINCE MAY 2013 AND DIAGNOSIS OF CANCER-PT WAS HAVING PANIC ATTACKS-FELT LIKE HE COULDN'T BREATHE--STATES HE IS FEELING BETTER ON ALPRAZOLAM  . Arthritis HANDS  . BPH (benign prostatic hypertrophy)   . CAD (coronary artery disease)    Circumflex stent 1996  //  catheterization January, 2000, 50-60% mid LAD,  dominant circumflex-50% in-stent restenosis similar to a cath in 1999, RCA small and nondominant,, EF 60%  . Cancer [facility])    CANCER IN RIGHT KIDNEY-SURGERY PLANNED  . Choledocholithiasis with chronic cholecystitis    BILE DUCT STONES AND GALLSTONES  . Complication of anesthesia clostraphobic  . Diabetes mellitus type 2, insulin dependent ([place])   . Diabetic foot ulcer ([place]) CURRENT RIGHT GREAT TOE WOUND W/ DRAINAGE --  DRESSING CHANGE EVERY OTHER DAY  . DIABETIC FOOT ULCER, TOE 08/01/2008   Qualifier: Diagnosis of  By: Elease Hashimoto MD, Bruce    . Ejection fraction    EF 60%, catheterization, 2000  //   EF 55-60%, echo, July, 2013  . Frequency of urination   . Generalized rash RIGHT SHOULDER/ BACK AREA  . GERD (gastroesophageal reflux disease)   . Hematuria    YRS AGO  . History of acute pancreatitis 2008  . History of gout STABLE PER PT  . History of kidney stones AGE 44  . History of MRSA infection 10 YRS AGO /  SUPERFICIAL SKIN AREA  . History of peptic  ulcer disease   . Hyperlipidemia   . Hypertension   . Left leg numbness COMPLETE NUMBNESS FROM HIP DOWN TO FOOT  . Nocturia   . Numbness in right leg FROM KNEE DOWN TO FOOT  . OSA (obstructive sleep apnea) CPAP USED UP UNTIL 2 YRS AGO  WHEN MASK WAS DAMAGED   . Peripheral neuropathy ([place])   . Preop cardiovascular exam    Cardiac clearance for nephrectomy and cholecystectomy June, 2013  . Renal insufficiency   . Renal mass, right     Patient Active Problem List   Diagnosis Date Noted  . Respiratory distress 04/19/2016  . Pressure injury of skin 03/13/2016  . Protein-calorie malnutrition, severe 03/12/2016  . Hypocalcemia 03/11/2016  . Proximal humerus fracture 03/11/2016  . Humerus fracture 03/11/2016  . Closed fracture of left proximal humerus   . Acquired hammer toe of right foot 03/11/2014  . Ulcer of foot, chronic ([place]) 02/18/2014  . 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 Metastatic renal cell carcinoma ([place]) 11/27/2013  . Obesity (BMI 30-39.9) 05/15/2013  . Anxiety 01/21/2012  . Biliary drain displacement - T-tube replaced 01/01/2012  . Ejection fraction   . CAD (coronary artery disease)   . Hypertension   . Type 2 diabetes, controlled, with peripheral neuropathy ([place])   . Hyperlipidemia   . Right renal mass 09/24/2011  . Bacteremia  due to Escherichia coli 09/18/2011  . Ascending cholangitis 09/14/2011  . 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 Overweight(278.02) 10/04/2008  . Sleep apnea 10/04/2008  . Ulcer of heel and midfoot ([place]) 08/01/2008  . HYPERLIPIDEMIA 07/24/2008  . Hereditary and idiopathic peripheral neuropathy 07/24/2008  . Allergic rhinitis, cause unspecified 07/24/2008  . GERD 07/24/2008  . Chronic kidney disease, unspecified 07/24/2008    Past Surgical History:  Procedure Laterality Date  . BILROTH II PROCEDURE  AGE 67   2 MONTHS LATER,  BILROTH II RECONSTRUCTION  . CARDIAC CATHETERIZATION  05-04-1998--- REPORT W/ CHART   50%  IN-STENT  RESTENOSIS OF CIRCUMFLEX/ 20% LEFT MAIN/ 50-60% LAD/ RCA NORMAL/ LV NORMAL / EF 60%  . CATARACT EXTRACTION W/ INTRAOCULAR LENS  IMPLANT, BILATERAL  2005  . CHOLECYSTECTOMY  12/20/2011   Procedure: CHOLECYSTECTOMY WITH COMMON DUCT EXPLORATION;  Surgeon: Marcello Moores A. Cornett, MD;  Location: [facility];  Service: General;  Laterality: N/A;  choloductoscopy  . CORONARY ANGIOPLASTY WITH STENT PLACEMENT  1996   STENTING X1 CIRCUMFLEX  . CT PERC CHOLECYSTOSTOMY  09-17-2011  . CYSTOSCOPY/RETROGRADE/URETEROSCOPY  10/26/2011   Procedure: CYSTOSCOPY/RETROGRADE/URETEROSCOPY;  Surgeon: Fredricka Bonine, MD;  Location[facility];  Service: Urology;  Laterality: N/A;  RIGHT URETEROSCOPY AND RIGHT RETROGRADE PYELOGRAM  DIABETIC C ARM   . ESOPHAGOGASTRODUODENOSCOPY  09/16/2011   Procedure: ESOPHAGOGASTRODUODENOSCOPY (EGD);  Surgeon: Missy Sabins, MD;  Location: [facility];  Service: Endoscopy;  Laterality: N/A;  . INTRAOPERATIVE CHOLANGIOGRAM  12/20/2011   Procedure: INTRAOPERATIVE CHOLANGIOGRAM;  Surgeon: Joyice Faster. Cornett, MD;  Location: [facility];  Service: General;;  . INTRAOPERATIVE CHOLANGIOGRAM  12/24/2011   Procedure: INTRAOPERATIVE CHOLANGIOGRAM;  Surgeon: Joyice Faster. Cornett, MD;  Location: [facility];  Service: General;  Laterality: N/A;  . LAPAROTOMY  12/24/2011   Procedure: EXPLORATORY LAPAROTOMY;  Surgeon: Joyice Faster. Cornett, MD;  Location: [facility];  Service: General;  Laterality: N/A;  Replacement of T-tube and Bile duct exploration  . PARTIAL NEPHRECTOMY  12/20/2011   Procedure: NEPHRECTOMY PARTIAL;  Surgeon: Fredricka Bonine, MD;  Location: [facility];  Service: Urology;  Laterality: Right;  Right Partial nephrectomy  . PENILE PROSTHESIS IMPLANT  2008   REMOVED 2010 DUE TO MALFUNCTION  . REMOVAL OF PENILE PROSTHESIS    . RIGHT GREAT TOE SURG.  2000  . SURG FOR REPAIR OF PERFERATED PEPTIC ULCER  AGE 43       Home Medications    Prior to Admission medications   Medication Sig  Start Date End Date Taking? Authorizing Provider  Amino Acids-Protein Hydrolys (FEEDING SUPPLEMENT, PRO-STAT SUGAR FREE 64,) LIQD Take 30 mLs by mouth 2 (two) times daily.   Yes Historical Provider, MD  aspirin 325 MG EC tablet Take 325 mg by mouth daily.    Yes Historical Provider, MD  atorvastatin (LIPITOR) 20 MG tablet Take 20 mg by mouth every evening.    Yes Historical Provider, MD  calcium-vitamin D (OSCAL WITH D) 500-200 MG-UNIT tablet Take 2 tablets by mouth 2 (two) times daily. 03/15/16  Yes Hosie Poisson, MD  carvedilol (COREG) 12.5 MG tablet Take 12.5 mg by mouth 2 (two) times daily. 09/10/15  Yes Historical Provider, MD  diphenoxylate-atropine (LOMOTIL) 2.5-0.025 MG tablet Take 1 tablet by mouth 4 (four) times daily as needed for diarrhea or loose stools.   Yes Historical Provider, MD  ergocalciferol (VITAMIN D2) 50000 units capsule Take 50,000 Units by mouth 2 (two) times a week.   Yes Historical Provider, MD  gabapentin (NEURONTIN) 800 MG tablet  Take 1 tablet (800 mg total) by mouth 3 (three) times daily. 02/18/16  Yes Eulas Post, MD  glucose blood (TRUETRACK TEST) test strip USE ONE STRIP TO CHECK GLUCOSE THREE TIMES DAILY       DX E11.9 04/18/15  Yes Eulas Post, MD  HYDROmorphone (DILAUDID) 2 MG tablet Take 1 tablet (2 mg total) by mouth every 4 (four) hours as needed for severe pain. Patient taking differently: Take 2-4 mg by mouth every 4 (four) hours as needed for severe pain.  03/15/16  Yes Hosie Poisson, MD  insulin aspart protamine- aspart (NOVOLOG MIX 70/30) (70-30) 100 UNIT/ML injection Inject 0.1 mLs (10 Units total) into the skin 2 (two) times daily with a meal. 03/15/16  Yes Hosie Poisson, MD  lactulose (CHRONULAC) 10 GM/15ML solution Take 20 g by mouth daily as needed for moderate constipation.    Yes Historical Provider, MD  LORazepam (ATIVAN) 0.5 MG tablet Take 1 tablet (0.5 mg total) by mouth every 6 (six) hours as needed for anxiety. 03/15/16  Yes Hosie Poisson,  MD  meclizine (ANTIVERT) 25 MG tablet Take 25 mg by mouth daily as needed for dizziness. Reported on 08/01/2015 01/28/14  Yes Historical Provider, MD  megestrol (MEGACE) 400 MG/10ML suspension Take 10 mLs (400 mg total) by mouth daily. Patient taking differently: Take 400 mg by mouth 2 (two) times daily.  03/15/16  Yes Hosie Poisson, MD  Multiple Vitamins-Minerals (DECUBI-VITE PO) Take 1 capsule by mouth daily.   Yes Historical Provider, MD  Nutritional Supplements (NUTRITIONAL DRINK PO) Take 120 mg by mouth 3 (three) times daily. [employer]   Yes Historical Provider, MD  nystatin (MYCOSTATIN) 100000 UNIT/ML suspension Take 5 mLs by mouth 5 (five) times daily.   Yes Historical Provider, MD  omeprazole (PRILOSEC) 20 MG capsule Take 40 mg by mouth 2 (two) times daily before a meal.    Yes Historical Provider, MD  ondansetron (ZOFRAN-ODT) 4 MG disintegrating tablet Take 4 mg by mouth every 6 (six) hours as needed for nausea or vomiting.   Yes Historical Provider, MD  polyethylene glycol (MIRALAX / GLYCOLAX) packet Take 17 g by mouth daily.   Yes Historical Provider, MD  traMADol (ULTRAM) 50 MG tablet Take 1 tablet (50 mg total) by mouth every 6 (six) hours as needed for moderate pain. 03/15/16  Yes Hosie Poisson, MD  triamcinolone cream (KENALOG) 0.1 % Apply topically 2 (two) times daily. Compound with Eucerin 1:1 and apply to affected area bid prn itching Patient taking differently: Apply 1 application topically 2 (two) times daily as needed (irritations). Compound with Eucerin 1:1 and apply to affected area bid prn itching 04/03/12  Yes Eulas Post, MD  vitamin C (ASCORBIC ACID) 500 MG tablet Take 500 mg by mouth 2 (two) times daily.   Yes Historical Provider, MD  feeding supplement, ENSURE ENLIVE, (ENSURE ENLIVE) LIQD Take 237 mLs by mouth 2 (two) times daily between meals. Patient not taking: Reported on 04/27/2016 03/15/16   Hosie Poisson, MD  omeprazole (PRILOSEC) 40 MG capsule Take 1 capsule (40 mg  total) by mouth 2 (two) times daily. Patient not taking: Reported on 04/21/2016 03/09/16   Eulas Post, MD  oxyCODONE-acetaminophen (PERCOCET/ROXICET) 5-325 MG tablet Take 1-2 tablets by mouth every 6 (six) hours as needed for severe pain. Patient not taking: Reported on 04/24/2016 03/11/16   Antonietta Breach, PA-C    Family History Family History  Problem Relation Age of Onset  . Colon cancer Mother 42  .  Colon cancer Sister 98  . Colon cancer Brother 78  . Stomach cancer Neg Hx     Social History Social History  Substance Use Topics  . Smoking status: Former Smoker    Packs/day: 0.25    Years: 2.00    Types: Cigarettes    Quit date: 02/25/1960  . Smokeless tobacco: Never Used  . Alcohol use No     Allergies   Codeine; Hydrocodone; Oxycodone; and Sulfa antibiotics   Review of Systems Review of Systems  Unable to perform ROS: Acuity of condition     Physical Exam Updated Vital Signs BP 103/59 (BP Location: Left Arm)   Pulse 79   Temp 97.4 F (36.3 C) (Oral)   Resp 16   SpO2 100%   Physical Exam  Constitutional: He appears well-developed and well-nourished. He appears distressed.  HENT:  Head: Normocephalic and atraumatic.  Dry mucous membranes  Eyes: EOM are normal. Pupils are equal, round, and reactive to light.  Neck: Normal range of motion. Neck supple. JVD present.  Cardiovascular: Normal rate and regular rhythm.   Pulmonary/Chest: He has rales.  Increased respiratory effort. Rhonchi in all lung fields.  Abdominal: Soft. Bowel sounds are normal. There is no tenderness. There is no rebound and no guarding.  Musculoskeletal: Normal range of motion. He exhibits no edema or tenderness.  No lower extremity swelling, asymmetry or tenderness.  Neurological: He is alert.  Moving all extremities without deficit. Following simple commands.  Skin: Skin is warm and dry. No rash noted. No erythema. There is pallor.  Psychiatric: He has a normal mood and affect.  His behavior is normal.  Nursing note and vitals reviewed.    ED Treatments / Results  Labs (all labs ordered are listed, but only abnormal results are displayed) Labs Reviewed  CBC WITH DIFFERENTIAL/PLATELET - Abnormal; Notable for the following:       Result Value   RBC 3.07 (*)    Hemoglobin 7.5 (*)    HCT 24.5 (*)    MCH 24.4 (*)    RDW 19.9 (*)    Lymphs Abs 0.6 (*)    All other components within normal limits  COMPREHENSIVE METABOLIC PANEL - Abnormal; Notable for the following:    Potassium 6.6 (*)    Chloride 113 (*)    CO2 15 (*)    Glucose, Bld 230 (*)    BUN 53 (*)    Creatinine, Ser 2.15 (*)    Calcium 7.9 (*)    Total Protein 5.8 (*)    Albumin 1.6 (*)    GFR calc non Af Amer 29 (*)    GFR calc Af Amer 33 (*)    All other components within normal limits  BRAIN NATRIURETIC PEPTIDE - Abnormal; Notable for the following:    B Natriuretic Peptide 238.2 (*)    All other components within normal limits  BLOOD GAS, VENOUS - Abnormal; Notable for the following:    pCO2, Ven 26.3 (*)    pO2, Ven 31.3 (*)    Bicarbonate 12.9 (*)    Acid-base deficit 11.9 (*)    All other components within normal limits  I-STAT CG4 LACTIC ACID, ED - Abnormal; Notable for the following:    Lactic Acid, Venous 2.18 (*)    All other components within normal limits  POTASSIUM  I-STAT TROPOININ, ED    EKG  EKG Interpretation  Date/Time:  Wednesday April 28 2016 09:17:21 EST Ventricular Rate:  96 PR Interval:  QRS Duration: 86 QT Interval:  339 QTC Calculation: 431 R Axis:   39 Text Interpretation:  Confirmed by Lita Mains  MD, Tashala Cumbo ([id number]) on 04/11/2016 9:25:37 AM Also confirmed by Lita Mains  MD, Dazia Lippold ([id number])  on 04/24/2016 1:00:57 PM       Radiology Dg Chest Port 1 View  Result Date: 04/11/2016 CLINICAL DATA:  Respiratory distress.  Hypoxia. EXAM: PORTABLE CHEST 1 VIEW COMPARISON:  two-view chest x-ray 01/12/2016. FINDINGS: The heart size is normal. Mild pulmonary  vascular congestion is present. Atherosclerotic calcifications are present at the aortic arch. Ill-defined right middle lobe airspace disease is present. IMPRESSION: 1. Ill-defined right middle lobe airspace disease is concerning for early infection. This may simply represent pulmonary vascular congestion. 2. Normal heart size but increase in pulmonary vascular congestion since prior exam. 3. Aortic atherosclerosis. Electronically Signed   By: San Morelle M.D.   On: 04/25/2016 09:52    Procedures Procedures (including critical care time)  Medications Ordered in ED Medications  LORazepam (ATIVAN) injection 0.5 mg (0.5 mg Intravenous Given 04/06/2016 0942)  sodium chloride 0.9 % bolus 500 mL (0 mLs Intravenous Stopped 04/22/2016 1210)  sodium chloride 0.9 % bolus 250 mL (0 mLs Intravenous Stopped 04/27/2016 1359)  insulin aspart (novoLOG) injection 10 Units (10 Units Intravenous Given 04/20/2016 1358)  dextrose 50 % solution 50 mL (50 mLs Intravenous Given 04/11/2016 1358)  sodium chloride 0.9 % bolus 250 mL (0 mLs Intravenous Stopped 04/26/2016 1425)  LORazepam (ATIVAN) injection 0.5 mg (0.5 mg Intravenous Given 04/02/2016 1425)    CRITICAL CARE Performed by: Lita Mains, Miku Udall Total critical care time: 50 minutes Critical care time was exclusive of separately billable procedures and treating other patients. Critical care was necessary to treat or prevent imminent or life-threatening deterioration. Critical care was time spent personally by me on the following activities: development of treatment plan with patient and/or surrogate as well as nursing, discussions with consultants, evaluation of patient's response to treatment, examination of patient, obtaining history from patient or surrogate, ordering and performing treatments and interventions, ordering and review of laboratory studies, ordering and review of radiographic studies, pulse oximetry and re-evaluation of patient's condition. Initial  Impression / Assessment and Plan / ED Course  I have reviewed the triage vital signs and the nursing notes.  Pertinent labs & imaging results that were available during my care of the patient were reviewed by me and considered in my medical decision making (see chart for details).  Clinical Course    Taken off CPAP on arrival. We'll place on BiPAP. Suspect fluid overload. Patient unable to tolerate BiPAP initially. Given 0.5 mg of Ativan for anxiety relief and BiPAP reattempted. Patient is breathing much better at this time. He is resting comfortably. Systolic Blood pressure is in the 80s and 90s. We'll give gentle fluid hydration though still suspect CHF exacerbation and not pneumonia/sepsis.  Improved blood pressure. Discussed with family crit nature of patient's illness. Asked to have palliative care consult. Discussed with Dr. Sarajane Jews. Will admit to step down bed. Final Clinical Impressions(s) / ED Diagnoses   Final diagnoses:  Respiratory distress  Acute renal failure, unspecified acute renal failure type (HCC)  Hyperkalemia  Anemia, unspecified type    New Prescriptions New Prescriptions   No medications on file     Julianne Rice, MD 04/11/2016 1448

## 2016-04-28 NOTE — ED Notes (Signed)
MD made aware of I-STAT results

## 2016-04-28 NOTE — ED Notes (Signed)
Report called to ICU RN.

## 2016-04-28 NOTE — ED Notes (Signed)
UNSUCCESSFUL LAB DRAW X 1

## 2016-04-28 NOTE — Progress Notes (Signed)
RT note:  Patient taken off of Bipap by RN for a brief trial.  Pt placed on 4L [place] with SpO2 stable at 93-95%.  RR 28-33 bpm with mildly increased WOB.  LS audibly coarse.  Patient able to spontaneously expectorate secretions.  Flutter attempted, however, patient's mental status prohibits proper use.  Family at bedside anxious about patient wearing Bipap and seemed more content with him being off.  RN administered morphine prior to trial.  RT will continue to monitor with the plan to leave patient off of Bipap for as long as he will tolerate.

## 2016-04-28 NOTE — ED Notes (Signed)
Bed: WA07 Expected date:  Expected time:  Means of arrival:  Comments: 75 SOB

## 2016-04-28 NOTE — H&P (Signed)
History and Physical  Frank Ashley [id number] DOB: [birth year] DOA: 04/23/2016  PCP: Eulas Post, MD  Patient coming from: SNF rehab  Chief Complaint: short of breath  HPI:  75yom PMH renal cell cancer, currently on chemotherapy, presented with history of hypoxia. In the emergency department was started on BiPAP, given Ativan for anxiety and IV fluids for hypotension. Initial impression was CHF exacerbation rather than pneumonia, acute kidney injury, hyperkalemia. With treatment with fluids the patient's blood pressure improved and with BiPAP respiratory status improved. He was referred for further evaluation of respiratory failure, acute kidney injury and hyperkalemia.  All history obtained from family at bedside as the patient is unable to write any because of respiratory status. Wife and 2 sons present.  Patient has a history of renal cell cancer, followed by oncology at [facility]. In November his oncologist told him there were very little options left, he was given one option for a form of chemotherapy with a possible 15% chance of improvement. The patient elected to pursue this. Over the last 4-6 weeks the patient has lost a substantial amount of weight. He has been nonambulatory since his surgical procedure earlier this year. Appetite is poor. He went to rehabilitation after breaking his shoulder in November. He has done poorly at rehabilitation. He has gone downhill substantially in the last week and has not tolerated his second round of chemotherapy.   He's been more confused confused and it was very difficult for his family to interact with him on Christmas Day. He's been sleeping most of the day even while in his wheelchair. Because of his substantial and rapid decline, the family actually had a meeting to discuss initiating hospice this afternoon. They also have an appointment with the oncologist scheduled for 12/29 to further discuss.  In sum he seems to be declining rather  rapidly, losing weight, poor appetite, failure to thrive. While the family is considering hospice, they are not ready to pursue comfort care. They report they've never had a discussion regard to [place] or goals of care.   ED Course: Hypoxic on nonrebreather, started on BiPAP with improvement of oxygen saturation to 100% on 50% FiO2. Afebrile, heart rate, respirations stable. Initial systolic blood pressure in the 80s. Now over 100 with IV fluids. Treated with insulin, dextrose, Ativan and 1 L fluid bolus. Pertinent labs: Venous blood gas noted. PCO2 was low, PO2 was low. Initial potassium 6.6, on repeat 5.5. Initial BUN 53, creatinine 2.15. BNP 238. Troponin negative. Lactic acid 2.18. Hemoglobin down, 7.5. EKG: Independently reviewed. Poor quality. Sinus rhythm. No acute changes. Imaging: Chest x-ray independently reviewed, suspicious for developing right lower lung field infiltrate. No pulmonary edema. I concur with radiologist interpretation.  Review of Systems:  Unobtainable except as above secondary to patient condition.  Past Medical History:  Diagnosis Date  . Acute pancreatitis 03/12/2010   Qualifier: Diagnosis of  By: Elease Hashimoto MD, Bruce    . Anxiety    SINCE MAY 2013 AND DIAGNOSIS OF CANCER-PT WAS HAVING PANIC ATTACKS-FELT LIKE HE COULDN'T BREATHE--STATES HE IS FEELING BETTER ON ALPRAZOLAM  . Arthritis HANDS  . BPH (benign prostatic hypertrophy)   . CAD (coronary artery disease)    Circumflex stent 1996  //  catheterization January, 2000, 50-60% mid LAD,  dominant circumflex-50% in-stent restenosis similar to a cath in 1999, RCA small and nondominant,, EF 60%  . Cancer [facility])    CANCER IN RIGHT KIDNEY-SURGERY PLANNED  . Choledocholithiasis with chronic cholecystitis    BILE  DUCT STONES AND GALLSTONES  . Complication of anesthesia clostraphobic  . Diabetes mellitus type 2, insulin dependent ([place])   . Diabetic foot ulcer ([place]) CURRENT RIGHT GREAT TOE WOUND W/ DRAINAGE --   DRESSING CHANGE EVERY OTHER DAY  . DIABETIC FOOT ULCER, TOE 08/01/2008   Qualifier: Diagnosis of  By: Elease Hashimoto MD, Bruce    . Ejection fraction    EF 60%, catheterization, 2000  //   EF 55-60%, echo, July, 2013  . Frequency of urination   . Generalized rash RIGHT SHOULDER/ BACK AREA  . GERD (gastroesophageal reflux disease)   . Hematuria    YRS AGO  . History of acute pancreatitis 2008  . History of gout STABLE PER PT  . History of kidney stones AGE 23  . History of MRSA infection 10 YRS AGO /  SUPERFICIAL SKIN AREA  . History of peptic ulcer disease   . Hyperlipidemia   . Hypertension   . Left leg numbness COMPLETE NUMBNESS FROM HIP DOWN TO FOOT  . Nocturia   . Numbness in right leg FROM KNEE DOWN TO FOOT  . OSA (obstructive sleep apnea) CPAP USED UP UNTIL 2 YRS AGO  WHEN MASK WAS DAMAGED   . Peripheral neuropathy ([place])   . Preop cardiovascular exam    Cardiac clearance for nephrectomy and cholecystectomy June, 2013  . Renal insufficiency   . Renal mass, right     Past Surgical History:  Procedure Laterality Date  . BILROTH II PROCEDURE  AGE 43   2 MONTHS LATER,  BILROTH II RECONSTRUCTION  . CARDIAC CATHETERIZATION  05-04-1998--- REPORT W/ CHART   50%  IN-STENT RESTENOSIS OF CIRCUMFLEX/ 20% LEFT MAIN/ 50-60% LAD/ RCA NORMAL/ LV NORMAL / EF 60%  . CATARACT EXTRACTION W/ INTRAOCULAR LENS  IMPLANT, BILATERAL  2005  . CHOLECYSTECTOMY  12/20/2011   Procedure: CHOLECYSTECTOMY WITH COMMON DUCT EXPLORATION;  Surgeon: Marcello Moores A. Cornett, MD;  Location: [facility];  Service: General;  Laterality: N/A;  choloductoscopy  . CORONARY ANGIOPLASTY WITH STENT PLACEMENT  1996   STENTING X1 CIRCUMFLEX  . CT PERC CHOLECYSTOSTOMY  09-17-2011  . CYSTOSCOPY/RETROGRADE/URETEROSCOPY  10/26/2011   Procedure: CYSTOSCOPY/RETROGRADE/URETEROSCOPY;  Surgeon: Fredricka Bonine, MD;  Location: [facility];  Service: Urology;  Laterality: N/A;  RIGHT URETEROSCOPY AND RIGHT RETROGRADE  PYELOGRAM  DIABETIC C ARM   . ESOPHAGOGASTRODUODENOSCOPY  09/16/2011   Procedure: ESOPHAGOGASTRODUODENOSCOPY (EGD);  Surgeon: Missy Sabins, MD;  Location: [facility];  Service: Endoscopy;  Laterality: N/A;  . INTRAOPERATIVE CHOLANGIOGRAM  12/20/2011   Procedure: INTRAOPERATIVE CHOLANGIOGRAM;  Surgeon: Joyice Faster. Cornett, MD;  Location: [facility];  Service: General;;  . INTRAOPERATIVE CHOLANGIOGRAM  12/24/2011   Procedure: INTRAOPERATIVE CHOLANGIOGRAM;  Surgeon: Joyice Faster. Cornett, MD;  Location: [facility];  Service: General;  Laterality: N/A;  . LAPAROTOMY  12/24/2011   Procedure: EXPLORATORY LAPAROTOMY;  Surgeon: Joyice Faster. Cornett, MD;  Location: [facility];  Service: General;  Laterality: N/A;  Replacement of T-tube and Bile duct exploration  . PARTIAL NEPHRECTOMY  12/20/2011   Procedure: NEPHRECTOMY PARTIAL;  Surgeon: Fredricka Bonine, MD;  Location: [facility];  Service: Urology;  Laterality: Right;  Right Partial nephrectomy  . PENILE PROSTHESIS IMPLANT  2008   REMOVED 2010 DUE TO MALFUNCTION  . REMOVAL OF PENILE PROSTHESIS    . RIGHT GREAT TOE SURG.  2000  . SURG FOR REPAIR OF PERFERATED PEPTIC ULCER  AGE 2     reports that he quit smoking about 56 years ago. His smoking  use included Cigarettes. He has a 0.50 pack-year smoking history. He has never used smokeless tobacco. He reports that he does not drink alcohol or use drugs. Nonambulatory  Allergies  Allergen Reactions  . Codeine Shortness Of Breath    SOB, NERVOUSNESS  . Hydrocodone Shortness Of Breath  . Oxycodone Shortness Of Breath    SOB, NERVOUSNESS  . Sulfa Antibiotics Rash    Family History  Problem Relation Age of Onset  . Colon cancer Mother 18  . Colon cancer Sister 27  . Colon cancer Brother 7  . Stomach cancer Neg Hx      Prior to Admission medications   Medication Sig Start Date End Date Taking? Authorizing Provider  Amino Acids-Protein Hydrolys (FEEDING SUPPLEMENT, PRO-STAT SUGAR FREE 64,) LIQD Take 30 mLs by  mouth 2 (two) times daily.   Yes Historical Provider, MD  aspirin 325 MG EC tablet Take 325 mg by mouth daily.    Yes Historical Provider, MD  atorvastatin (LIPITOR) 20 MG tablet Take 20 mg by mouth every evening.    Yes Historical Provider, MD  calcium-vitamin D (OSCAL WITH D) 500-200 MG-UNIT tablet Take 2 tablets by mouth 2 (two) times daily. 03/15/16  Yes Hosie Poisson, MD  carvedilol (COREG) 12.5 MG tablet Take 12.5 mg by mouth 2 (two) times daily. 09/10/15  Yes Historical Provider, MD  diphenoxylate-atropine (LOMOTIL) 2.5-0.025 MG tablet Take 1 tablet by mouth 4 (four) times daily as needed for diarrhea or loose stools.   Yes Historical Provider, MD  ergocalciferol (VITAMIN D2) 50000 units capsule Take 50,000 Units by mouth 2 (two) times a week.   Yes Historical Provider, MD  gabapentin (NEURONTIN) 800 MG tablet Take 1 tablet (800 mg total) by mouth 3 (three) times daily. 02/18/16  Yes Eulas Post, MD  glucose blood (TRUETRACK TEST) test strip USE ONE STRIP TO CHECK GLUCOSE THREE TIMES DAILY       DX E11.9 04/18/15  Yes Eulas Post, MD  HYDROmorphone (DILAUDID) 2 MG tablet Take 1 tablet (2 mg total) by mouth every 4 (four) hours as needed for severe pain. Patient taking differently: Take 2-4 mg by mouth every 4 (four) hours as needed for severe pain.  03/15/16  Yes Hosie Poisson, MD  insulin aspart protamine- aspart (NOVOLOG MIX 70/30) (70-30) 100 UNIT/ML injection Inject 0.1 mLs (10 Units total) into the skin 2 (two) times daily with a meal. 03/15/16  Yes Hosie Poisson, MD  lactulose (CHRONULAC) 10 GM/15ML solution Take 20 g by mouth daily as needed for moderate constipation.    Yes Historical Provider, MD  LORazepam (ATIVAN) 0.5 MG tablet Take 1 tablet (0.5 mg total) by mouth every 6 (six) hours as needed for anxiety. 03/15/16  Yes Hosie Poisson, MD  meclizine (ANTIVERT) 25 MG tablet Take 25 mg by mouth daily as needed for dizziness. Reported on 08/01/2015 01/28/14  Yes Historical Provider,  MD  megestrol (MEGACE) 400 MG/10ML suspension Take 10 mLs (400 mg total) by mouth daily. Patient taking differently: Take 400 mg by mouth 2 (two) times daily.  03/15/16  Yes Hosie Poisson, MD  Multiple Vitamins-Minerals (DECUBI-VITE PO) Take 1 capsule by mouth daily.   Yes Historical Provider, MD  Nutritional Supplements (NUTRITIONAL DRINK PO) Take 120 mg by mouth 3 (three) times daily. [employer]   Yes Historical Provider, MD  nystatin (MYCOSTATIN) 100000 UNIT/ML suspension Take 5 mLs by mouth 5 (five) times daily.   Yes Historical Provider, MD  omeprazole (PRILOSEC) 20 MG capsule Take 40  mg by mouth 2 (two) times daily before a meal.    Yes Historical Provider, MD  ondansetron (ZOFRAN-ODT) 4 MG disintegrating tablet Take 4 mg by mouth every 6 (six) hours as needed for nausea or vomiting.   Yes Historical Provider, MD  polyethylene glycol (MIRALAX / GLYCOLAX) packet Take 17 g by mouth daily.   Yes Historical Provider, MD  traMADol (ULTRAM) 50 MG tablet Take 1 tablet (50 mg total) by mouth every 6 (six) hours as needed for moderate pain. 03/15/16  Yes Hosie Poisson, MD  triamcinolone cream (KENALOG) 0.1 % Apply topically 2 (two) times daily. Compound with Eucerin 1:1 and apply to affected area bid prn itching Patient taking differently: Apply 1 application topically 2 (two) times daily as needed (irritations). Compound with Eucerin 1:1 and apply to affected area bid prn itching 04/03/12  Yes Eulas Post, MD  vitamin C (ASCORBIC ACID) 500 MG tablet Take 500 mg by mouth 2 (two) times daily.   Yes Historical Provider, MD  feeding supplement, ENSURE ENLIVE, (ENSURE ENLIVE) LIQD Take 237 mLs by mouth 2 (two) times daily between meals. Patient not taking: Reported on 04/30/2016 03/15/16   Hosie Poisson, MD  omeprazole (PRILOSEC) 40 MG capsule Take 1 capsule (40 mg total) by mouth 2 (two) times daily. Patient not taking: Reported on 04/06/2016 03/09/16   Eulas Post, MD  oxyCODONE-acetaminophen  (PERCOCET/ROXICET) 5-325 MG tablet Take 1-2 tablets by mouth every 6 (six) hours as needed for severe pain. Patient not taking: Reported on 04/24/2016 03/11/16   Antonietta Breach, PA-C    Physical Exam: Vitals:   04/19/2016 1354 04/25/2016 1400 04/03/2016 1430 05/01/2016 1500  BP: 103/59 (!) 109/51    Pulse: 79 79 85 85  Resp: 16 18 16 19   Temp:      TempSrc:      SpO2: 100% 100% 100% 100%    Constitutional:  . On BiPAP. Appears critically ill. Eyes:  . Pupils and irises appear normal . Normal lids ENMT:  . external ears, nose appear normal . grossly normal hearing . Lips appear normal Neck:  . neck appears normal, no masses . no thyromegaly Respiratory:  . Fair air movement. No frank wheezes, rales or rhonchi. . Tolerating BiPAP well. Mild to moderate increased respiratory effort. Cardiovascular:  . RRR, no m/r/g . No LE extremity edema   Abdomen:  . Abdomen appears normal; no tenderness or masses Musculoskeletal:  . RUE, LUE, RLE, LLE   o strength unable to assess but tone normal, no abnormal movements o No tenderness, masses noted Skin:  . Ecchymosis scattered over the extremities . palpation of skin: no induration or nodules Neurologic:  . Unable to assess Psychiatric:  . judgement and insight unable to assess . Mental status o Mood, affect unable to assess  Wt Readings from Last 3 Encounters:  04/15/16 84.6 kg (186 lb 6.4 oz)  03/11/16 84.7 kg (186 lb 11.7 oz)  02/13/16 89.2 kg (196 lb 11.2 oz)    I have personally reviewed following labs and imaging studies  Labs on Admission:  CBC:  Recent Labs Lab 04/05/2016 1205  WBC 7.1  NEUTROABS 6.4  HGB 7.5*  HCT 24.5*  MCV 79.8  PLT [id number]   Basic Metabolic Panel:  Recent Labs Lab 04/28/16 1205 04/28/16 1420  NA 137  --   K 6.6* 5.5*  CL 113*  --   CO2 15*  --   GLUCOSE 230*  --   BUN 53*  --  CREATININE 2.15*  --   CALCIUM 7.9*  --    Liver Function Tests:  Recent Labs Lab 04/17/2016 1205  AST 38    ALT 21  ALKPHOS 111  BILITOT 0.9  PROT 5.8*  ALBUMIN 1.6*    Radiological Exams on Admission: Dg Chest Port 1 View  Result Date: 04/25/2016 CLINICAL DATA:  Respiratory distress.  Hypoxia. EXAM: PORTABLE CHEST 1 VIEW COMPARISON:  two-view chest x-ray 01/12/2016. FINDINGS: The heart size is normal. Mild pulmonary vascular congestion is present. Atherosclerotic calcifications are present at the aortic arch. Ill-defined right middle lobe airspace disease is present. IMPRESSION: 1. Ill-defined right middle lobe airspace disease is concerning for early infection. This may simply represent pulmonary vascular congestion. 2. Normal heart size but increase in pulmonary vascular congestion since prior exam. 3. Aortic atherosclerosis. Electronically Signed   By: San Morelle M.D.   On: 04/24/2016 09:52    Active Problems:   Respiratory distress    Assessment/Plan 1. Acute hypoxic respiratory failure, suspect secondary to pneumonia   BiPAP 2. Suspected sepsis secondary to right sided developing pneumonia  Trend lactic acid  Volume resuscitation  Sepsis protocol 3. Suspected developing pneumonia   Empiric antibiotics  Follow-up culture data 4. Acute kidney injury with associated hyperkalemia, non-anion gap metabolic acidosis.  Secondary to failure to thrive, poor oral intake; possibly complicated by acute issues as above. Potassium treated and trending downwards. Repeat this evening.  5. Anemia of chronic disease, acute on chronic   Multifactorial in nature including malignancy and acute issues 6. Diabetes mellitus type 2  Random blood sugar 230, anion gap within normal limits 7. Renal cell carcinoma  Patient is critically ill, discussed in detail with wife, 2 sons. They understand he may not survive this hospitalization. Discussed treatment options along the spectrum including full scope of care to comfort care. They have not previously discussed this. At this point, having  discussed the treatment options, they desire:  DNR/DNI  IV fluids, treat potassium, treat infection, BiPAP acceptable  Palliative medicine consultation  If patient worsens, they appear to be willing to revise goals of care.   DVT prophylaxis: SCDs Code Status: DNR/DNI Family Communication: as above   Time spent: 62 minutes   Murray Hodgkins, MD  [facility] Direct contact: [phone] --Via [place]  --[email or web address]; password [email or web address]  7PM-7AM contact night coverage as above  04/16/2016, 4:22 PM

## 2016-04-29 DIAGNOSIS — R0609 Other forms of dyspnea: Secondary | ICD-10-CM

## 2016-04-29 DIAGNOSIS — L8993 Pressure ulcer of unspecified site, stage 3: Secondary | ICD-10-CM

## 2016-04-29 DIAGNOSIS — J189 Pneumonia, unspecified organism: Secondary | ICD-10-CM

## 2016-04-29 DIAGNOSIS — L89150 Pressure ulcer of sacral region, unstageable: Secondary | ICD-10-CM

## 2016-04-29 DIAGNOSIS — A419 Sepsis, unspecified organism: Principal | ICD-10-CM

## 2016-04-29 DIAGNOSIS — N179 Acute kidney failure, unspecified: Secondary | ICD-10-CM

## 2016-04-29 DIAGNOSIS — L89153 Pressure ulcer of sacral region, stage 3: Secondary | ICD-10-CM

## 2016-04-29 DIAGNOSIS — R0600 Dyspnea, unspecified: Secondary | ICD-10-CM

## 2016-04-29 DIAGNOSIS — J9601 Acute respiratory failure with hypoxia: Secondary | ICD-10-CM

## 2016-04-29 DIAGNOSIS — C649 Malignant neoplasm of unspecified kidney, except renal pelvis: Secondary | ICD-10-CM

## 2016-04-29 DIAGNOSIS — J9600 Acute respiratory failure, unspecified whether with hypoxia or hypercapnia: Secondary | ICD-10-CM

## 2016-04-29 DIAGNOSIS — E875 Hyperkalemia: Secondary | ICD-10-CM

## 2016-04-29 DIAGNOSIS — E43 Unspecified severe protein-calorie malnutrition: Secondary | ICD-10-CM

## 2016-04-29 DIAGNOSIS — R0989 Other specified symptoms and signs involving the circulatory and respiratory systems: Secondary | ICD-10-CM

## 2016-04-29 DIAGNOSIS — L89109 Pressure ulcer of unspecified part of back, unspecified stage: Secondary | ICD-10-CM

## 2016-04-29 LAB — BASIC METABOLIC PANEL
ANION GAP: 10 (ref 5–15)
ANION GAP: 11 (ref 5–15)
BUN: 62 mg/dL — ABNORMAL HIGH (ref 6–20)
BUN: 71 mg/dL — ABNORMAL HIGH (ref 6–20)
CALCIUM: 7.1 mg/dL — AB (ref 8.9–10.3)
CHLORIDE: 118 mmol/L — AB (ref 101–111)
CHLORIDE: 116 mmol/L — AB (ref 101–111)
CO2: 11 mmol/L — ABNORMAL LOW (ref 22–32)
CO2: 11 mmol/L — AB (ref 22–32)
Calcium: 7.2 mg/dL — ABNORMAL LOW (ref 8.9–10.3)
Creatinine, Ser: 2.05 mg/dL — ABNORMAL HIGH (ref 0.61–1.24)
Creatinine, Ser: 2.11 mg/dL — ABNORMAL HIGH (ref 0.61–1.24)
GFR calc non Af Amer: 30 mL/min — ABNORMAL LOW (ref >60)
GFR calc non Af Amer: 29 mL/min — ABNORMAL LOW (ref >60)
GFR, EST AFRICAN AMERICAN: 35 mL/min — AB (ref >60)
GFR, EST AFRICAN AMERICAN: 34 mL/min — AB (ref >60)
Glucose, Bld: 314 mg/dL — ABNORMAL HIGH (ref 65–99)
Glucose, Bld: 461 mg/dL — ABNORMAL HIGH (ref 65–99)
POTASSIUM: 6.2 mmol/L — AB (ref 3.5–5.1)
Potassium: 6.3 mmol/L (ref 3.5–5.1)
SODIUM: 139 mmol/L (ref 135–145)
Sodium: 138 mmol/L (ref 135–145)

## 2016-04-29 LAB — CBC
HEMATOCRIT: 24.1 % — AB (ref 39.0–52.0)
Hemoglobin: 7.5 g/dL — ABNORMAL LOW (ref 13.0–17.0)
MCH: 24.8 pg — ABNORMAL LOW (ref 26.0–34.0)
MCHC: 31.1 g/dL (ref 30.0–36.0)
MCV: 79.8 fL (ref 78.0–100.0)
PLATELETS: 396 10*3/uL (ref 150–400)
RBC: 3.02 MIL/uL — AB (ref 4.22–5.81)
RDW: 19.8 % — ABNORMAL HIGH (ref 11.5–15.5)
WBC: 9.5 10*3/uL (ref 4.0–10.5)

## 2016-04-29 LAB — MRSA PCR SCREENING: MRSA by PCR: NEGATIVE

## 2016-04-29 LAB — GLUCOSE, CAPILLARY: GLUCOSE-CAPILLARY: 349 mg/dL — AB (ref 65–99)

## 2016-04-29 LAB — STREP PNEUMONIAE URINARY ANTIGEN: Strep Pneumo Urinary Antigen: NEGATIVE

## 2016-04-29 MED ORDER — DAKINS (1/4 STRENGTH) 0.125 % EX SOLN
Freq: Two times a day (BID) | CUTANEOUS | Status: DC
  Filled 2016-04-29: qty 473

## 2016-04-29 MED ORDER — STERILE WATER FOR INJECTION IV SOLN
INTRAVENOUS | Status: DC
  Filled 2016-04-29: qty 850

## 2016-04-29 MED ORDER — LIP MEDEX EX OINT
TOPICAL_OINTMENT | CUTANEOUS | Status: AC
  Filled 2016-04-29: qty 7

## 2016-04-29 MED ORDER — DEXTROSE 50 % IV SOLN
50.0000 mL | Freq: Once | INTRAVENOUS | Status: AC
  Administered 2016-04-29: 50 mL via INTRAVENOUS
  Filled 2016-04-29: qty 50

## 2016-04-29 MED ORDER — INSULIN ASPART 100 UNIT/ML ~~LOC~~ SOLN: 0.0000 [IU] | SUBCUTANEOUS | Status: DC

## 2016-04-29 MED ORDER — GLUCERNA SHAKE PO LIQD
237.0000 mL | Freq: Three times a day (TID) | ORAL | Status: DC
  Filled 2016-04-29 (×2): qty 237

## 2016-04-29 MED ORDER — CALCIUM GLUCONATE 10 % IV SOLN
1.0000 g | Freq: Once | INTRAVENOUS | Status: AC
  Administered 2016-04-29: 1 g via INTRAVENOUS
  Filled 2016-04-29: qty 10

## 2016-04-29 MED ORDER — BUDESONIDE 0.25 MG/2ML IN SUSP: 0.2500 mg | Freq: Two times a day (BID) | RESPIRATORY_TRACT | Status: DC

## 2016-04-29 MED ORDER — SODIUM POLYSTYRENE SULFONATE 15 GM/60ML PO SUSP
15.0000 g | Freq: Once | ORAL | Status: DC
  Filled 2016-04-29: qty 60

## 2016-04-29 MED ORDER — INSULIN GLARGINE 100 UNIT/ML ~~LOC~~ SOLN
14.0000 [IU] | Freq: Every day | SUBCUTANEOUS | Status: DC
  Filled 2016-04-29: qty 0.14

## 2016-04-29 MED ORDER — INSULIN ASPART 100 UNIT/ML IV SOLN
15.0000 [IU] | Freq: Once | INTRAVENOUS | Status: AC
  Administered 2016-04-29: 15 [IU] via INTRAVENOUS
  Filled 2016-04-29: qty 0.15

## 2016-04-30 DIAGNOSIS — L89150 Pressure ulcer of sacral region, unstageable: Secondary | ICD-10-CM

## 2016-05-03 LAB — CULTURE, BLOOD (ROUTINE X 2)
CULTURE: NO GROWTH
Culture: NO GROWTH

## 2016-05-03 NOTE — Progress Notes (Signed)
   2016/05/21 1000  Clinical Encounter Type  Visited With Patient and family together  Visit Type Initial;Psychological support;Spiritual support;Critical Care  Referral From Nurse  Consult/Referral To Chaplain  Spiritual Encounters  Spiritual Needs Grief support;Emotional;Other (Comment) (Pastoral Conversation/ Support)  Stress Factors  Patient Stress Factors Not reviewed  Family Stress Factors None identified   I visited with the patient's family who were present at the bedside per referral by the nurse.  The patient was not awake during the brief visit. The patient's relatives state that they did not need anything at this time.    Please, contact Spiritual Care for further assistance.  [place] M.Div.

## 2016-05-03 NOTE — Progress Notes (Signed)
Nutrition Brief Note  Patient identified to be seen for Malnutrition Screening Tool. Chart reviewed. Patient now transitioning to comfort care. Per chart prognosis hours to day.  Patient NPO. No nutrition interventions warranted at this time. Please consult Dietitian as needed.   Willey Blade, MS, RD, LDN Pager: [phone] After Hours Pager: [phone]

## 2016-05-03 NOTE — Consult Note (Addendum)
Consultation Note Date: 05-03-16   Patient Name: Frank [place]  DOB: Jun 19, 1940  MRN: [id number]  Age / Sex: 76 y.o., male  PCP: Eulas Post, MD Referring Physician: Barton Dubois, MD  Reason for Consultation: Terminal Care  HPI/Patient Profile: 76 y.o. male  with past medical history of  Renal cell cancer admitted on 04/22/2016 with sepsis, PNA, respiratory failure, acute kidney injury.   Clinical Assessment and Goals of Care:  76 yo with renal cell cancer, presented with shortness of breath, recent ongoing decline and hypoxia. Patient recently received chemotherapy and was undergoing care at [facility]. Patient has a wife and 4 children, 2 children live locally. A month ago patient had a fall and had a shoulder fracture. A week ago, the patient has had ongoing decline increased work of breathing not able to eat. Hence, he was admitted with hypoxia. He was found to have possible pulmonary edema and pneumonia and was initiated on sepsis protocol because of elevated lactic acid. He was initially given BiPAP but was noted to be more agitated and not tolerating it and that was subsequently discontinued. Patient's respiratory rate continued to be in the 30s and he continued to be hypotensive. Patient required more and more use of symptom management medication such as morphine and Haldol.  A palliative care consult has since been requested for terminal care.  Patient is essentially unresponsive resting in bed. His sister and friend are present at the bedside. It is reported that the patient's wife and 2 children just left the hospital either to eat or take a break. I will follow-up later this afternoon to meet with them. Agree with current scope of care. See recommendations below. Thank you for the consult.  NEXT OF KIN  wife, 2 sons.   SUMMARY OF RECOMMENDATIONS    continue current comfort measures: agree with  Morphine IV PRN Anticipate hospital death: prognosis appears to be hours to some very limited number of days.  Comfort cart for family Appreciate chaplain follow up  Code Status/Advance Care Planning:  DNR    Symptom Management:    as above   Palliative Prophylaxis:   Delirium Protocol  Additional Recommendations (Limitations, Scope, Preferences):  Full Comfort Care  Psycho-social/Spiritual:   Desire for further Chaplaincy support:yes  Additional Recommendations: Education on Hospice  Prognosis:   Hours - Days  Discharge Planning: Anticipated Hospital Death      Primary Diagnoses: Present on Admission: . (Resolved) Respiratory distress   I have reviewed the medical record, interviewed the patient and family, and examined the patient. The following aspects are pertinent.  Past Medical History:  Diagnosis Date  . Acute pancreatitis 03/12/2010   Qualifier: Diagnosis of  By: Elease Hashimoto MD, Bruce    . Anxiety    SINCE MAY 2013 AND DIAGNOSIS OF CANCER-PT WAS HAVING PANIC ATTACKS-FELT LIKE HE COULDN'T BREATHE--STATES HE IS FEELING BETTER ON ALPRAZOLAM  . Arthritis HANDS  . BPH (benign prostatic hypertrophy)   . CAD (coronary artery disease)    Circumflex stent 1996  //  catheterization January, 2000, 50-60% mid LAD,  dominant circumflex-50% in-stent restenosis similar to a cath in 1999, RCA small and nondominant,, EF 60%  . Cancer [facility])    CANCER IN RIGHT KIDNEY-SURGERY PLANNED  . Choledocholithiasis with chronic cholecystitis    BILE DUCT STONES AND GALLSTONES  . Complication of anesthesia clostraphobic  . Diabetes mellitus type 2, insulin dependent ([place])   . Diabetic foot ulcer ([place]) CURRENT RIGHT GREAT TOE WOUND W/ DRAINAGE --  DRESSING CHANGE EVERY OTHER DAY  . DIABETIC FOOT ULCER, TOE 08/01/2008   Qualifier: Diagnosis of  By: Elease Hashimoto MD, Bruce    . Ejection fraction    EF 60%, catheterization, 2000  //   EF 55-60%, echo, July, 2013  . Frequency of  urination   . Generalized rash RIGHT SHOULDER/ BACK AREA  . GERD (gastroesophageal reflux disease)   . Hematuria    YRS AGO  . History of acute pancreatitis 2008  . History of gout STABLE PER PT  . History of kidney stones AGE 78  . History of MRSA infection 10 YRS AGO /  SUPERFICIAL SKIN AREA  . History of peptic ulcer disease   . Hyperlipidemia   . Hypertension   . Left leg numbness COMPLETE NUMBNESS FROM HIP DOWN TO FOOT  . Nocturia   . Numbness in right leg FROM KNEE DOWN TO FOOT  . OSA (obstructive sleep apnea) CPAP USED UP UNTIL 2 YRS AGO  WHEN MASK WAS DAMAGED   . Peripheral neuropathy ([place])   . Preop cardiovascular exam    Cardiac clearance for nephrectomy and cholecystectomy June, 2013  . Renal insufficiency   . Renal mass, right    Social History   Social History  . Marital status: Married    Spouse name: N/A  . Number of children: N/A  . Years of education: N/A   Social History Main Topics  . Smoking status: Former Smoker    Packs/day: 0.25    Years: 2.00    Types: Cigarettes    Quit date: 02/25/1960  . Smokeless tobacco: Never Used  . Alcohol use No  . Drug use: No  . Sexual activity: Not Asked   Other Topics Concern  . None   Social History Narrative   Lives at home with wife [place]. Has 4 grown children and 13 grandchildren. Doesn't smoke, no drugs, no alcohol.    Family History  Problem Relation Age of Onset  . Colon cancer Mother 53  . Colon cancer Sister 22  . Colon cancer Brother 60  . Stomach cancer Neg Hx    Scheduled Meds: . budesonide (PULMICORT) nebulizer solution  0.25 mg Nebulization BID  . ceFEPime (MAXIPIME) IV  1 g Intravenous Q12H  . enoxaparin (LOVENOX) injection  40 mg Subcutaneous Q24H  . insulin aspart  0-9 Units Subcutaneous Q4H  . insulin glargine  14 Units Subcutaneous Daily  . sodium chloride flush  3 mL Intravenous Q12H  . sodium polystyrene  15 g Oral Once  . vancomycin  1,250 mg Intravenous Q24H   Continuous  Infusions: . sodium chloride 75 mL/hr at May 13, 2016 0900   PRN Meds:.acetaminophen **OR** acetaminophen, haloperidol lactate, morphine injection Medications Prior to Admission:  Prior to Admission medications   Medication Sig Start Date End Date Taking? Authorizing Provider  Amino Acids-Protein Hydrolys (FEEDING SUPPLEMENT, PRO-STAT SUGAR FREE 64,) LIQD Take 30 mLs by mouth 2 (two) times daily.   Yes Historical Provider, MD  aspirin 325 MG EC tablet Take 325 mg by  mouth daily.    Yes Historical Provider, MD  atorvastatin (LIPITOR) 20 MG tablet Take 20 mg by mouth every evening.    Yes Historical Provider, MD  calcium-vitamin D (OSCAL WITH D) 500-200 MG-UNIT tablet Take 2 tablets by mouth 2 (two) times daily. 03/15/16  Yes Hosie Poisson, MD  carvedilol (COREG) 12.5 MG tablet Take 12.5 mg by mouth 2 (two) times daily. 09/10/15  Yes Historical Provider, MD  diphenoxylate-atropine (LOMOTIL) 2.5-0.025 MG tablet Take 1 tablet by mouth 4 (four) times daily as needed for diarrhea or loose stools.   Yes Historical Provider, MD  ergocalciferol (VITAMIN D2) 50000 units capsule Take 50,000 Units by mouth 2 (two) times a week.   Yes Historical Provider, MD  gabapentin (NEURONTIN) 800 MG tablet Take 1 tablet (800 mg total) by mouth 3 (three) times daily. 02/18/16  Yes Eulas Post, MD  glucose blood (TRUETRACK TEST) test strip USE ONE STRIP TO CHECK GLUCOSE THREE TIMES DAILY       DX E11.9 04/18/15  Yes Eulas Post, MD  HYDROmorphone (DILAUDID) 2 MG tablet Take 1 tablet (2 mg total) by mouth every 4 (four) hours as needed for severe pain. Patient taking differently: Take 2-4 mg by mouth every 4 (four) hours as needed for severe pain.  03/15/16  Yes Hosie Poisson, MD  insulin aspart protamine- aspart (NOVOLOG MIX 70/30) (70-30) 100 UNIT/ML injection Inject 0.1 mLs (10 Units total) into the skin 2 (two) times daily with a meal. 03/15/16  Yes Hosie Poisson, MD  lactulose (CHRONULAC) 10 GM/15ML solution Take  20 g by mouth daily as needed for moderate constipation.    Yes Historical Provider, MD  LORazepam (ATIVAN) 0.5 MG tablet Take 1 tablet (0.5 mg total) by mouth every 6 (six) hours as needed for anxiety. 03/15/16  Yes Hosie Poisson, MD  meclizine (ANTIVERT) 25 MG tablet Take 25 mg by mouth daily as needed for dizziness. Reported on 08/01/2015 01/28/14  Yes Historical Provider, MD  megestrol (MEGACE) 400 MG/10ML suspension Take 10 mLs (400 mg total) by mouth daily. Patient taking differently: Take 400 mg by mouth 2 (two) times daily.  03/15/16  Yes Hosie Poisson, MD  Multiple Vitamins-Minerals (DECUBI-VITE PO) Take 1 capsule by mouth daily.   Yes Historical Provider, MD  Nutritional Supplements (NUTRITIONAL DRINK PO) Take 120 mg by mouth 3 (three) times daily. [employer]   Yes Historical Provider, MD  nystatin (MYCOSTATIN) 100000 UNIT/ML suspension Take 5 mLs by mouth 5 (five) times daily.   Yes Historical Provider, MD  omeprazole (PRILOSEC) 20 MG capsule Take 40 mg by mouth 2 (two) times daily before a meal.    Yes Historical Provider, MD  ondansetron (ZOFRAN-ODT) 4 MG disintegrating tablet Take 4 mg by mouth every 6 (six) hours as needed for nausea or vomiting.   Yes Historical Provider, MD  polyethylene glycol (MIRALAX / GLYCOLAX) packet Take 17 g by mouth daily.   Yes Historical Provider, MD  traMADol (ULTRAM) 50 MG tablet Take 1 tablet (50 mg total) by mouth every 6 (six) hours as needed for moderate pain. 03/15/16  Yes Hosie Poisson, MD  triamcinolone cream (KENALOG) 0.1 % Apply topically 2 (two) times daily. Compound with Eucerin 1:1 and apply to affected area bid prn itching Patient taking differently: Apply 1 application topically 2 (two) times daily as needed (irritations). Compound with Eucerin 1:1 and apply to affected area bid prn itching 04/03/12  Yes Eulas Post, MD  vitamin C (ASCORBIC ACID) 500 MG tablet  Take 500 mg by mouth 2 (two) times daily.   Yes Historical Provider, MD  feeding  supplement, ENSURE ENLIVE, (ENSURE ENLIVE) LIQD Take 237 mLs by mouth 2 (two) times daily between meals. Patient not taking: Reported on 04/07/2016 03/15/16   Hosie Poisson, MD  omeprazole (PRILOSEC) 40 MG capsule Take 1 capsule (40 mg total) by mouth 2 (two) times daily. Patient not taking: Reported on 04/10/2016 03/09/16   Eulas Post, MD  oxyCODONE-acetaminophen (PERCOCET/ROXICET) 5-325 MG tablet Take 1-2 tablets by mouth every 6 (six) hours as needed for severe pain. Patient not taking: Reported on 05/02/2016 03/11/16   Antonietta Breach, PA-C   Allergies  Allergen Reactions  . Codeine Shortness Of Breath    SOB, NERVOUSNESS  . Hydrocodone Shortness Of Breath  . Oxycodone Shortness Of Breath    SOB, NERVOUSNESS  . Sulfa Antibiotics Rash   Review of Systems Non responsive   Physical Exam Pale, weak unresponsive gentleman Rapid shallow breathing Does not appear to have any nonverbal gestures of distress/discomfort S1-S2 Abdomen soft Mild edema Essentially unresponsive at this point  Vital Signs: BP (!) 106/39 (BP Location: Right Arm)   Pulse 91   Temp 97.4 F (36.3 C) (Axillary)   Resp (!) 24   Ht 6' (1.829 m)   Wt 81.4 kg (179 lb 7.3 oz)   SpO2 92%   BMI 24.34 kg/m  Pain Assessment: PAINAD   Pain Score: 8    SpO2: SpO2: 92 % O2 Device:SpO2: 92 % O2 Flow Rate: .O2 Flow Rate (L/min): 5 L/min  IO: Intake/output summary:  Intake/Output Summary (Last 24 hours) at 2016/05/16 1315 Last data filed at May 16, 2016 0900  Gross per 24 hour  Intake             4975 ml  Output              380 ml  Net             4595 ml    LBM: Last BM Date: 16-May-2016 Baseline Weight: Weight: 81.4 kg (179 lb 7.3 oz) Most recent weight: Weight: 81.4 kg (179 lb 7.3 oz)     Palliative Assessment/Data:   Flowsheet Rows   Flowsheet Row Most Recent Value  Intake Tab  Referral Department  Hospitalist  Unit at Time of Referral  Intermediate Care Unit  Palliative Care Primary Diagnosis  Cancer    Palliative Care Type  New Palliative care  Reason for referral  End of Life Care Assistance  Date first seen by Palliative Care  May 16, 2016  Clinical Assessment  Palliative Performance Scale Score  20%  Pain Max last 24 hours  5  Pain Min Last 24 hours  4  Dyspnea Max Last 24 Hours  4  Dyspnea Min Last 24 hours  3  Nausea Max Last 24 Hours  3  Psychosocial & Spiritual Assessment  Palliative Care Outcomes  Patient/Family meeting held?  Yes  Who was at the meeting?  family   Palliative Care Outcomes  Clarified goals of care      Time In:  12 Time Out:  1300 Time Total:  60 min  Greater than 50%  of this time was spent counseling and coordinating care related to the above assessment and plan.  Signed by: Loistine Chance, MD  [phone]  Please contact Palliative Medicine Team phone at [phone] for questions and concerns.  For individual provider: See Amion    Addendum: Family meeting for about 20-25 minutes this  afternoon, wife and children, in laws arrived by the bedside.  We discussed about end of life signs and symptoms in detail Prognosis hours to days discussed Anticipated hospital death discussed Brief information about hospice provided Discussed comfort measures in detail All questions answered to the best of my ability.  We will follow up on 04-30-16.   Loistine Chance MD

## 2016-05-03 NOTE — Discharge Summary (Signed)
Death Summary  JOSIAH MANTEY [id number] DOB: 1941-03-24 DOA: May 05, 2016  PCP: Eulas Post, MD PCP/Office notified: notified through [place], office closed after 7:00 pm  Admit date: May 05, 2016 Date of Death: 05/07/16  Final Diagnoses:  Principal Problem:   Acute respiratory failure with hypoxia ([place]) Active Problems:   Sepsis ([place])   Pneumonia   AKI (acute kidney injury) ([place])   Hyperkalemia Renal cell carcinoma Decubitus ulcer  Severe protein calorie malnutrition   History of present illness:  As per H&P written by Dr. Sarajane Jews on 05-May-2016 75yom PMH renal cell cancer, currently on chemotherapy, presented with history of hypoxia. In the emergency department was started on BiPAP, given Ativan for anxiety and IV fluids for hypotension. Initial impression was CHF exacerbation rather than pneumonia, acute kidney injury, hyperkalemia. With treatment with fluids the patient's blood pressure improved and with BiPAP respiratory status improved. He was referred for further evaluation of respiratory failure, acute kidney injury and hyperkalemia.  Hospital Course:  Patient has a history of renal cell cancer, followed by oncology at [facility]. In November his oncologist told him there were very little options left, he was given one option for a form of chemotherapy with a possible 15% chance of improvement. The patient elected to pursue this. Over the last 4-6 weeks the patient has lost a substantial amount of weight. He has been nonambulatory since his surgical procedure earlier this year. Appetite is poor. He went to rehabilitation after breaking his shoulder in November. He has done poorly at rehabilitation. He has gone downhill substantially in the last week prior to admission and was not able to tolerate his second round of chemotherapy. Patient continue rapidly declining, unable to eat with worsening mentation. In the ED was found to have SIRS/Sepsis with concerns for HCAP; also with AKI and  hyperkalemia. Patient was treated aggressively with sodium bicarbonate, calcium gluconate, insulin and albuterol; kayexalate and IVF's were also given. For his HCAP and sepsis process was initiated on broad spectrum antibiotics and BIPAP. After 24 hours of aggressive intervention his respiratory status continue to deteriorates and meeting with palliative care elicit new GOC to be full comfort and symptomatic management. Antibiotics, blood draws and any invasive intervention was discontinued at that time; patient started on morphine, haldol and ativan PRN for end of life care. Weaned off BIPAP to [place]. On 05-06-16 at 7:40 PM patient passed away; family was at bedside.   Time: 25 minutes  Signed:  Barton Dubois  [facility] 07-May-2016, 8:18 AM

## 2016-05-03 NOTE — Consult Note (Signed)
[place] Nurse wound consult note Reason for Consult: Large Unstageable pressure injury in an elderly patient with end of life concerns. Patient with bilateral pressure redistribution heel boots in place (LEs are edematous, but heels are intact).  Patient is being turned side to side, supine position is avoided and HOB is at or below a 30-degree angle. Wound type:Pressure Pressure Ulcer POA: Yes Measurement: 12cm x 10cm area of black to grey malodorous eschar.  Center obscures wound depth with fluctuant white tissue. Wound bed: As described above Drainage (amount, consistency, odor) scant drainage on old dressing consistent with autolytically debriding necrotic tissue Periwound: intact, dry.  Mild halo of erythema ar periphery of wound. Dressing procedure/placement/frequency: I have implemented a conservative POC for Nursing for topical care using sodium hypochlorite (1/4 strength Dakin's solution) for control of odor so that family can visit without wearing masks due to strong odor. This will also assist in the control of organisms. Wife, son-in-law and daughter taught that pressure injuries at the end of life have to do with nutritional status, failure of the skin organ and supine positioning in response to their inquiry if this is because of inadequate cleansing. All seem to have good understanding.  Palliative Care is to have a [place] meeting with family at 4 pm today. Family is gathering now. If a more aggressive POC is desired, CCS could be consulted or PT for hydrotherapy. I do not believe that operative debridement is an option at this time. [place] nursing team will not follow, but will remain available to this patient, the nursing and medical teams.  Please re-consult if needed. Thanks, Maudie Flakes, MSN, RN, [place], Arther Abbott  Pager# [phone]

## 2016-05-03 NOTE — Progress Notes (Signed)
Inpatient Diabetes Program Recommendations  AACE/ADA: New Consensus Statement on Inpatient Glycemic Control (2015)  Target Ranges:  Prepandial:   less than 140 mg/dL      Peak postprandial:   less than 180 mg/dL (1-2 hours)      Critically ill patients:  140 - 180 mg/dL   Results for GERVASE, PETRICCA (MRN [id number]) as of 01-May-2016 09:22  Ref. Range 04/11/2016 12:05 04/02/2016 14:32 May 01, 2016 03:42  Glucose Latest Ref Range: 65 - 99 mg/dL 230 (H) 320 (H) 314 (H)    Admit SOB/ Pneumonia   History: DM  Home DM Meds: 70/30 Insulin- 10 units BID  Current Insulin Orders: None       MD- Please consider the following if within goals of care for pt:  1. Start Novolog Sensitive Correction Scale/ SSI (0-9 units) Q4 hours  2. Start Lantus 12 units daily (0.15 units/kg dosing based on weight of 81 kg)      --Will follow patient during hospitalization--  Wyn Quaker RN, MSN, CDE Diabetes Coordinator Inpatient Glycemic Control Team Team Pager: [phone] (8a-5p)

## 2016-05-03 NOTE — Progress Notes (Signed)
Time of death pronounced by this RN and Gilmer Mor, Agricultural consultant at [phone].  Family present.

## 2016-05-03 NOTE — Progress Notes (Signed)
[facility] PROGRESS NOTE  TRAVEZ YACCARINO [id number] DOB: Jan 22, 1941 DOA: 04/06/2016 PCP: Eulas Post, MD  Interim summary and HPI 75yom PMH renal cell cancer, currently on chemotherapy, presented with history of hypoxia. In the emergency department was started on BiPAP, given Ativan for anxiety and IV fluids for hypotension. Initial impression was CHF exacerbation rather than pneumonia, acute kidney injury, hyperkalemia. With treatment with fluids the patient's blood pressure improved and with BiPAP respiratory status improved. He was referred for further evaluation of respiratory failure, acute kidney injury and hyperkalemia. Doing not to good and still with acute resp failure. Will follow palliative care rec's  Assessment/Plan: 1-acute resp failure with hypoxia: patient requiring BIPAP on admission, positive tachypnea and increase work of breathing. -CXR with concerns for early PNA -for now will continue abx's (for HCAP coverage), started on pulmicort and follow clinical response -poor response to BIPAP -family reporting acute active decline in the last 10 days or so prior to admission -palliative care consulted, will follow rec's for [place] and advance directives  2-sepsis: with positive tachypnea, CXR findings, AKI and in patient with immunosuppression/who lives in SNF -will continue treatment for HCAP -oxygen supplementation and nebulizer -received fluid resuscitation  -soft, but stable BP now  3-AKI with associated hyperkalemia and non-anion gap metabolic acidosis  -most likely pre-renal in nature -minimizing nephrotoxic drugs -received IVF's -will provide kayexalate and use insulin  -calcium gluconate given -will check on telemetry   4-hyperkalemia -calcium gluconate given  -will use insulin  -kayexalate ordered -bicarbonate ordered  5-renal cell carcinoma -has been followed by oncology service at [facility] -patient essentially has failed chemotherapy regimen   -looking to transition to hospice  -palliative care consulted  6-Diabetes: with hyperglycemia -will use lantus and SSI Q4H  7-severe protein calorie malnutrition  -will use glucerna   Code Status: DNR Family Communication: son, daughter and wife at bedside  Disposition Plan: will continue abx's for now; will involve palliative care and follow rec's.   Consultants:  Palliative care  Procedures:  See below for x-ray reports   Antibiotics:  Cefepime  12/27  Vancomycin 12/27  HPI/Subjective: Afebrile, positive tachypnea and increase work of breathing overnight. Patient didn't tolerate BIPAP well. Mild use of accessory resp muscles appreciated.  Objective: Vitals:   2016-05-19 0900 05/19/2016 1200  BP:    Pulse: 91   Resp: (!) 24   Temp:  97.4 F (36.3 C)    Intake/Output Summary (Last 24 hours) at 2016/05/19 1246 Last data filed at 05/19/16 0900  Gross per 24 hour  Intake             4975 ml  Output              380 ml  Net             4595 ml   Filed Weights   04/08/2016 1639  Weight: 81.4 kg (179 lb 7.3 oz)    Exam:   General:  Afebrile, in mild resp distress, positive tachypnea and somnolent, with inability to verbalize concerns, easily arouse though.  Cardiovascular: S1 and S2, no rubs, no gallops  Respiratory: fair air movement, diffuse rhonchi and scattered exp wheezing. Mild use of accessory muscles appreciated.  Abdomen: soft, NT, ND, positive BS  Musculoskeletal: trace edema, no cyanosis   Data Reviewed: Basic Metabolic Panel:  Recent Labs Lab 04/15/2016 1205 04/05/2016 1420 04/27/2016 1432 05-19-2016 0342 May 19, 2016 0941  NA 137  --  139 139 138  K 6.6* 5.5* 5.8*  6.2* 6.3*  CL 113*  --  117* 118* 116*  CO2 15*  --  11* 11* 11*  GLUCOSE 230*  --  320* 314* 461*  BUN 53*  --  54* 62* 71*  CREATININE 2.15*  --  2.03* 2.05* 2.11*  CALCIUM 7.9*  --  7.6* 7.2* 7.1*   Liver Function Tests:  Recent Labs Lab 04/27/2016 1205  AST 38  ALT 21   ALKPHOS 111  BILITOT 0.9  PROT 5.8*  ALBUMIN 1.6*   CBC:  Recent Labs Lab 04/17/2016 1205 May 04, 2016 0342  WBC 7.1 9.5  NEUTROABS 6.4  --   HGB 7.5* 7.5*  HCT 24.5* 24.1*  MCV 79.8 79.8  PLT 387 396   BNP (last 3 results)  Recent Labs  04/05/2016 1205  BNP 238.2*   CBG: No results for input(s): GLUCAP in the last 168 hours.  Recent Results (from the past 240 hour(s))  MRSA PCR Screening     Status: None   Collection Time: 04/08/2016 11:38 PM  Result Value Ref Range Status   MRSA by PCR NEGATIVE NEGATIVE Final    Comment:        The GeneXpert MRSA Assay (FDA approved for NASAL specimens only), is one component of a comprehensive MRSA colonization surveillance program. It is not intended to diagnose MRSA infection nor to guide or monitor treatment for MRSA infections.      Studies: Dg Chest Port 1 View  Result Date: 04/26/2016 CLINICAL DATA:  Respiratory distress.  Hypoxia. EXAM: PORTABLE CHEST 1 VIEW COMPARISON:  two-view chest x-ray 01/12/2016. FINDINGS: The heart size is normal. Mild pulmonary vascular congestion is present. Atherosclerotic calcifications are present at the aortic arch. Ill-defined right middle lobe airspace disease is present. IMPRESSION: 1. Ill-defined right middle lobe airspace disease is concerning for early infection. This may simply represent pulmonary vascular congestion. 2. Normal heart size but increase in pulmonary vascular congestion since prior exam. 3. Aortic atherosclerosis. Electronically Signed   By: San Morelle M.D.   On: 04/28/2016 09:52    Scheduled Meds: . budesonide (PULMICORT) nebulizer solution  0.25 mg Nebulization BID  . ceFEPime (MAXIPIME) IV  1 g Intravenous Q12H  . enoxaparin (LOVENOX) injection  40 mg Subcutaneous Q24H  . sodium chloride flush  3 mL Intravenous Q12H  . sodium polystyrene  15 g Oral Once  . vancomycin  1,250 mg Intravenous Q24H   Continuous Infusions: . sodium chloride 75 mL/hr at May 04, 2016  0900    Principal Problem:   Acute respiratory failure with hypoxia (HCC) Active Problems:   Sepsis ([place])   Pneumonia   AKI (acute kidney injury) ([place])   Hyperkalemia    Time spent: 30 minutes (50% of time dedicated to discussed plan of care, [place] decisions and clinical update to patient and family members at bedside)    [place], [facility] Pager [phone]. If 7PM-7AM, please contact night-coverage at [email or web address], password [email or web address] 05/04/16, 12:46 PM  LOS: 1 day

## 2016-05-03 DEATH — deceased

## 2016-05-17 ENCOUNTER — Ambulatory Visit (INDEPENDENT_AMBULATORY_CARE_PROVIDER_SITE_OTHER): Payer: Medicare Other | Admitting: Physician Assistant
